# Patient Record
Sex: Male | Born: 1965
Health system: Southern US, Community
[De-identification: ages and names within clinical notes are randomized; demographics above are authoritative.]

## PROBLEM LIST (undated history)

## (undated) DIAGNOSIS — K219 Gastro-esophageal reflux disease without esophagitis: Secondary | ICD-10-CM

## (undated) DIAGNOSIS — I10 Essential (primary) hypertension: Secondary | ICD-10-CM

## (undated) DIAGNOSIS — E039 Hypothyroidism, unspecified: Secondary | ICD-10-CM

## (undated) DIAGNOSIS — Z593 Problems related to living in residential institution: Secondary | ICD-10-CM

## (undated) DIAGNOSIS — R569 Unspecified convulsions: Secondary | ICD-10-CM

## (undated) DIAGNOSIS — G40909 Epilepsy, unspecified, not intractable, without status epilepticus: Secondary | ICD-10-CM

## (undated) DIAGNOSIS — G4733 Obstructive sleep apnea (adult) (pediatric): Secondary | ICD-10-CM

## (undated) DIAGNOSIS — H5500 Unspecified nystagmus: Secondary | ICD-10-CM

## (undated) DIAGNOSIS — Q909 Down syndrome, unspecified: Secondary | ICD-10-CM

## (undated) DIAGNOSIS — Z789 Other specified health status: Secondary | ICD-10-CM

## (undated) HISTORY — PX: OTHER SURGICAL HISTORY: SHX169

## (undated) HISTORY — DX: Epilepsy, unspecified, not intractable, without status epilepticus: G40.909

## (undated) HISTORY — DX: Down syndrome, unspecified: Q90.9

## (undated) HISTORY — DX: Essential (primary) hypertension: I10

## (undated) HISTORY — DX: Obstructive sleep apnea (adult) (pediatric): G47.33

## (undated) HISTORY — DX: Hypothyroidism, unspecified: E03.9

---

## 1997-10-25 ENCOUNTER — Encounter (HOSPITAL_COMMUNITY): Admission: RE | Admit: 1997-10-25 | Discharge: 1998-01-23 | Payer: Self-pay | Admitting: Family Medicine

## 2001-04-18 ENCOUNTER — Ambulatory Visit: Admission: RE | Admit: 2001-04-18 | Discharge: 2001-04-18 | Payer: Self-pay | Admitting: Family Medicine

## 2004-05-14 ENCOUNTER — Ambulatory Visit: Payer: Self-pay | Admitting: Internal Medicine

## 2004-05-27 ENCOUNTER — Ambulatory Visit: Payer: Self-pay | Admitting: Internal Medicine

## 2005-10-01 ENCOUNTER — Inpatient Hospital Stay (HOSPITAL_COMMUNITY): Admission: EM | Admit: 2005-10-01 | Discharge: 2005-10-06 | Payer: Self-pay | Admitting: *Deleted

## 2005-10-15 ENCOUNTER — Inpatient Hospital Stay (HOSPITAL_COMMUNITY): Admission: EM | Admit: 2005-10-15 | Discharge: 2005-11-02 | Payer: Self-pay | Admitting: Specialist

## 2005-10-26 ENCOUNTER — Ambulatory Visit: Payer: Self-pay | Admitting: Infectious Diseases

## 2005-12-13 ENCOUNTER — Emergency Department (HOSPITAL_COMMUNITY): Admission: EM | Admit: 2005-12-13 | Discharge: 2005-12-13 | Payer: Self-pay | Admitting: Family Medicine

## 2006-06-24 ENCOUNTER — Emergency Department (HOSPITAL_COMMUNITY): Admission: EM | Admit: 2006-06-24 | Discharge: 2006-06-25 | Payer: Self-pay | Admitting: Emergency Medicine

## 2006-07-01 ENCOUNTER — Ambulatory Visit: Payer: Self-pay | Admitting: Vascular Surgery

## 2006-07-01 ENCOUNTER — Inpatient Hospital Stay (HOSPITAL_COMMUNITY): Admission: EM | Admit: 2006-07-01 | Discharge: 2006-07-13 | Payer: Self-pay | Admitting: Emergency Medicine

## 2006-07-15 ENCOUNTER — Emergency Department (HOSPITAL_COMMUNITY): Admission: EM | Admit: 2006-07-15 | Discharge: 2006-07-15 | Payer: Self-pay | Admitting: Emergency Medicine

## 2006-07-21 ENCOUNTER — Ambulatory Visit (HOSPITAL_BASED_OUTPATIENT_CLINIC_OR_DEPARTMENT_OTHER): Admission: RE | Admit: 2006-07-21 | Discharge: 2006-07-21 | Payer: Self-pay

## 2006-07-22 ENCOUNTER — Emergency Department (HOSPITAL_COMMUNITY): Admission: EM | Admit: 2006-07-22 | Discharge: 2006-07-22 | Payer: Self-pay | Admitting: Emergency Medicine

## 2006-07-23 ENCOUNTER — Ambulatory Visit (HOSPITAL_COMMUNITY): Admission: RE | Admit: 2006-07-23 | Discharge: 2006-07-23 | Payer: Self-pay | Admitting: Emergency Medicine

## 2006-07-24 ENCOUNTER — Ambulatory Visit: Payer: Self-pay | Admitting: Internal Medicine

## 2006-09-02 ENCOUNTER — Ambulatory Visit: Payer: Self-pay | Admitting: Internal Medicine

## 2006-11-10 ENCOUNTER — Ambulatory Visit: Payer: Self-pay | Admitting: Internal Medicine

## 2007-06-23 ENCOUNTER — Encounter: Admission: RE | Admit: 2007-06-23 | Discharge: 2007-06-23 | Payer: Self-pay | Admitting: Family Medicine

## 2007-10-09 ENCOUNTER — Encounter: Payer: Self-pay | Admitting: Internal Medicine

## 2007-10-15 ENCOUNTER — Encounter: Payer: Self-pay | Admitting: Internal Medicine

## 2007-12-28 ENCOUNTER — Encounter: Admission: RE | Admit: 2007-12-28 | Discharge: 2007-12-28 | Payer: Self-pay | Admitting: Family Medicine

## 2008-07-05 DIAGNOSIS — G4733 Obstructive sleep apnea (adult) (pediatric): Secondary | ICD-10-CM

## 2008-07-05 DIAGNOSIS — Q909 Down syndrome, unspecified: Secondary | ICD-10-CM | POA: Insufficient documentation

## 2008-07-08 ENCOUNTER — Ambulatory Visit: Payer: Self-pay | Admitting: Internal Medicine

## 2008-09-04 ENCOUNTER — Encounter: Admission: RE | Admit: 2008-09-04 | Discharge: 2008-09-04 | Payer: Self-pay | Admitting: Family Medicine

## 2008-11-04 ENCOUNTER — Encounter: Payer: Self-pay | Admitting: Internal Medicine

## 2009-07-07 ENCOUNTER — Ambulatory Visit: Payer: Self-pay | Admitting: Internal Medicine

## 2009-07-29 ENCOUNTER — Emergency Department (HOSPITAL_COMMUNITY): Admission: EM | Admit: 2009-07-29 | Discharge: 2009-07-29 | Payer: Self-pay | Admitting: Emergency Medicine

## 2009-08-13 ENCOUNTER — Encounter: Admission: RE | Admit: 2009-08-13 | Discharge: 2009-08-13 | Payer: Self-pay | Admitting: Family Medicine

## 2010-06-21 ENCOUNTER — Encounter: Payer: Self-pay | Admitting: Specialist

## 2010-06-30 NOTE — Assessment & Plan Note (Signed)
Summary: 1 year f/u/mbw   Primary Provider/Referring Provider:  Hildred Laser, MD  CC:  Yearly Follow up visit.  History of Present Illness: 07/08/08- OSA, Down's Hx is from his transporter. Actual family is not here.  Thayer Ohm is now using CPAP without oxygen. Uses cpap every night and she says he has adjusted and uses it without problemt. He is back in group home, using cpap everynight and staff passes on that he sleeps well with it. No concerns or questions reported.  July 07, 2009- OSA, Down's...................................transporter/ aide is here One year f/u up for this 45 yo w/ Down's and OSA. He continues using CPAP all night every night 10 cwp at his group home. He gets sleepy at lunch time while he gets up earlier during renovations at his residence building, but not because of CPAP. He indicates "yeah" ok with CPAP to sleep. Not reported to have other breathing problems to need his nebulizer.     Current Medications (verified): 1)  Levothyroxine Sodium 75 Mcg Tabs (Levothyroxine Sodium) .... Take 1 Tablet By Mouth Once A Day 2)  Bayer Low Strength 81 Mg Tbec (Aspirin) .... Take 1 Tablet By Mouth Once A Day 3)  Depakote 500 Mg Tbec (Divalproex Sodium) .... Take 1 Every Morning and 2 By Mouth Every Night 4)  Pepcid 20 Mg Tabs (Famotidine) .... Take 1 Tablet By Mouth Two Times A Day 5)  Qc Senna-S 8.6-50 Mg Tabs (Sennosides-Docusate Sodium) .... Take 1 Tab By Mouth At Bedtime 6)  Mucinex 600 Mg Xr12h-Tab (Guaifenesin) .... Take 1 By Mouth Two Times A Day 7)  Tylenol 325 Mg Tabs (Acetaminophen) .... Take 2 By Mouth Evey 4 Hours As Needed 8)  Albuterol Sulfate (2.5 Mg/33ml) 0.083% Nebu (Albuterol Sulfate) .Marland Kitchen.. 1 Vial Via Nebulizer Evey 6 Hours As Needed 9)  Cpap Advanced .Marland Kitchen.. 10cm  Allergies (verified): No Known Drug Allergies  Past History:  Past Medical History: Last updated: 07/08/2008  DOWNS SYNDROME (ICD-758.0) OBSTRUCTIVE SLEEP APNEA  (ICD-327.23) Hypothyroid  Family History: Last updated: 07/07/2009 Unknown- not available  Social History: Last updated: 07/08/2008 Group home Patient never smoked.   Risk Factors: Smoking Status: never (07/08/2008)  Past Surgical History: None known to staff  Family History: Unknown- not available  Review of Systems      See HPI  The patient denies anorexia, fever, weight loss, vision loss, hoarseness, chest pain, syncope, peripheral edema, prolonged cough, headaches, hemoptysis, abdominal pain, melena, and severe indigestion/heartburn.    Vital Signs:  Patient profile:   45 year old male Weight:      186 pounds O2 Sat:      94 % on Room air Pulse rate:   89 / minute BP sitting:   122 / 80  (left arm) Cuff size:   regular  Vitals Entered By: Reynaldo Minium CMA (July 07, 2009 10:27 AM)  O2 Flow:  Room air  Physical Exam  Additional Exam:  General: A/Ox3; pleasant and cooperative, NAD, Down's Syndrome- limited/ nonverbal, calm  SKIN: no rash, lesions NODES: no lymphadenopathy HEENT: Cobre/AT, EOM- WNL, Conjuctivae- clear, PERRLA, TM-WNL, Nose- clear, Throat- clear  Melampatti IV NECK: Supple w/ fair ROM, JVD- none, normal carotid impulses w/o bruits Thyroid-  CHEST: Clear to P&A HEART: RRR, no m/g/r heard ABDOMEN: Soft and nl;  TDV:VOHY, nl pulses, no edema  NEURO: Grossly intact to observation      Impression & Recommendations:  Problem # 1:  OBSTRUCTIVE SLEEP APNEA (ICD-327.23)  He appears to be  stable and doing well with CPAP. History is limited- he contributes none and aide with him is unaware of any concerns or needed changes.  Other Orders: Est. Patient Level II (09811)  Patient Instructions: 1)  Schedule return in one year, earlier if needed 2)  Continue CPAP at 10 cwp. Call as needed   Immunization History:  Influenza Immunization History:    Influenza:  historical (03/24/2009)

## 2010-07-06 ENCOUNTER — Ambulatory Visit (INDEPENDENT_AMBULATORY_CARE_PROVIDER_SITE_OTHER): Payer: PRIVATE HEALTH INSURANCE | Admitting: Internal Medicine

## 2010-07-06 ENCOUNTER — Encounter: Payer: Self-pay | Admitting: Internal Medicine

## 2010-07-06 DIAGNOSIS — G4733 Obstructive sleep apnea (adult) (pediatric): Secondary | ICD-10-CM

## 2010-07-06 DIAGNOSIS — Q909 Down syndrome, unspecified: Secondary | ICD-10-CM

## 2010-07-16 NOTE — Assessment & Plan Note (Signed)
Summary: ROV 1 YEAR   Primary Provider/Referring Provider:  Hildred Laser, MD  CC:  Yearly follow up visit-OSA; wearing CPAP each night but takes mask off at times..  History of Present Illness: 07/08/08- OSA, Down's Hx is from his transporter. Actual family is not here.  [name] is now using CPAP without oxygen. Uses cpap every night and she says he has adjusted and uses it without problemt. He is back in group home, using cpap everynight and staff passes on that he sleeps well with it. No concerns or questions reported.  July 07, 2009- OSA, Down's...................................transporter/ aide is here One year f/u up for this 45 yo w/ Down's and OSA. He continues using CPAP all night every night 10 cwp at his group home. He gets sleepy at lunch time while he gets up earlier during renovations at his residence building, but not because of CPAP. He indicates "yeah" ok with CPAP to sleep. Not reported to have other breathing problems to need his nebulizer.  July 06, 2010- OSA, Down's...................................transporter/ aide is here Nurse-CC: Yearly follow up visit-OSA; wearing CPAP each night but takes mask off at times. OSA- He wears mask every night with no concerns or special problems. They feel it helps. [name] is not able to speak for himself. He does take the mask off sometimes at night. Some nights he turns TV on to watch during night and will seem tireder next day.      Preventive Screening-Counseling & Management  Alcohol-Tobacco     Smoking Status: never  Current Medications (verified): 1)  Levothyroxine Sodium 75 Mcg Tabs (Levothyroxine Sodium) .... Take 1 Tablet By Mouth Once A Day 2)  Bayer Low Strength 81 Mg Tbec (Aspirin) .... Take 1 Tablet By Mouth Once A Day 3)  Depakote 500 Mg Tbec (Divalproex Sodium) .... Take 1 Every Morning and 2 By Mouth Every Night 4)  Pepcid 20 Mg Tabs (Famotidine) .... Take 1 Tablet By Mouth Two Times A Day 5)  Qc Senna-S  8.6-50 Mg Tabs (Sennosides-Docusate Sodium) .... Take 1 Tab By Mouth At Bedtime 6)  Mucinex 600 Mg Xr12h-Tab (Guaifenesin) .... Take 1 By Mouth Two Times A Day 7)  Tylenol 325 Mg Tabs (Acetaminophen) .... Take 2 By Mouth Evey 4 Hours As Needed 8)  Albuterol Sulfate (2.5 Mg/69ml) 0.083% Nebu (Albuterol Sulfate) .Marland Kitchen.. 1 Vial Via Nebulizer Evey 6 Hours As Needed 9)  Cpap Advanced .Marland Kitchen.. 10cm  Allergies (verified): No Known Drug Allergies  Past History:  Past Medical History: Last updated: 07/08/2008  DOWNS SYNDROME (ICD-758.0) OBSTRUCTIVE SLEEP APNEA (ICD-327.23) Hypothyroid  Family History: Last updated: 07/07/2009 Unknown- not available  Social History: Last updated: 07/08/2008 Group home Patient never smoked.   Risk Factors: Smoking Status: never (07/06/2010)  Past Surgical History: Right elbow surgery  Review of Systems      See HPI  The patient denies anorexia, fever, weight loss, weight gain, vision loss, hoarseness, chest pain, syncope, peripheral edema, prolonged cough, headaches, hemoptysis, abdominal pain, severe indigestion/heartburn, abnormal bleeding, enlarged lymph nodes, and angioedema.    Vital Signs:  Patient profile:   45 year old male Weight:      200.38 pounds O2 Sat:      95 % on Room air Pulse rate:   88 / minute BP sitting:   120 / 78  (left arm) Cuff size:   large  Vitals Entered By: Reynaldo Minium CMA (July 06, 2010 10:37 AM)  O2 Flow:  Room air CC: Yearly follow  up visit-OSA; wearing CPAP each night but takes mask off at times.   Physical Exam  Additional Exam:  General: A/Ox3; pleasant and cooperative, NAD, Down's Syndrome- limited/ nonverbal, calm, overweight SKIN: no rash, lesions NODES: no lymphadenopathy HEENT: /AT, EOM- WNL, Conjuctivae- clear, PERRLA, TM-WNL, Nose- clear, Throat- clear  Mallampati IV NECK: Supple w/ fair ROM, JVD- none, normal carotid impulses w/o bruits Thyroid-  CHEST: Clear to P&A HEART: RRR, no m/g/r  heard ABDOMEN: Soft and nl;  ZOX:WRUE, nl pulses, no edema  NEURO: Responds to yes and no questions. Horizontal nystagmus      Impression & Recommendations:  Problem # 1:  OBSTRUCTIVE SLEEP APNEA (ICD-327.23)  Good control and compliance with CPAP and staff feels it continues to help him, as observe in his sheltered living facility. .   Problem # 2:  DOWNS SYNDROME (ICD-758.0)  Getting old for Downs Syndrome.   Other Orders: Est. Patient Level III (45409)  Patient Instructions: 1)  Please schedule a follow-up appointment in 1 year. 2)  Continue CPAP at 10. Please let [facility] know if there are concerns with the mask or machine.

## 2010-10-13 NOTE — Assessment & Plan Note (Signed)
[facility]                             PULMONARY OFFICE NOTE   NAME:[name], [name]                 MRN:          [id number]  DATE:11/10/2006                            DOB:          Mar 09, 1966    PROBLEM:  1. Obstructive sleep apnea.  2. Down's syndrome.   HISTORY:  Overnight oximetry on room air done on April 15th showed that  he desaturated below 88% almost half the night.  I discussed this with  his mother and the caregiver from the home where he stays and we  compared it to his sleep study of February 15, which demonstrated  moderately severe obstructive apnea with an index of 55.7.  He did not  desaturate much at all on that study.  Mother says he is doing very much  better and is back to his usual personality, seeming happy and  comfortable since he returned to his familiar environment in his group  home.  That environment does not permit use of oxygen or CPAP.  He very  much disliked staying at the center where he could have had that kind  of support.  He would not stay on oxygen when it was tried in the center  and was intolerant of CPAP.  Mother clearly feels that his quality of  life overall is better if we drop the issues of his respiratory status  so that he can stay in his more familiar environment.  I went over this  several times with her and the assistant to be sure they understood.   MEDICATION:  1. Levothyroxine 75 mcg.  2. Aspirin 81 mg.  3. Depakote 500 mg x2 b.i.d.  4. Pepcid 20 mg b.i.d.  5. Albuterol by nebulizer p.r.n.   No medication allergy.   OBJECTIVE:  Weight 176 pounds, BP 112/58, pulse 76, room air saturation  97%.  He is alert and pleasant.  There is a little bit of duskiness at  his knees but nail beds are pink.  He is clearly not able to comprehend  medical issues being discussed.  Breathing seems unlabored.  HEART SOUNDS:  Regular.   IMPRESSION:  1. Moderately severe obstructive sleep apnea.  2.  Nocturnal hypoxemia demonstrated on overnight oximetry but less      apparent on the night of his sleep study.   PLAN:  Mother and caregiver express understanding and willingness to  accept the medical consequences of staying off oxygen and CPAP, which  they do not believe he would leave on  anyway.  It is much important, they feel, to his quality of life that he  be allowed to stay in the group home.  I have offered to see him again  p.r.n.     Manuel D. Maple Hudson, MD, [name], FACP  Electronically Signed    CDY/MedQ  DD: 11/12/2006  DT: 11/13/2006  Job #: [id number]   cc:   Dione Housekeeper, Dr.

## 2010-10-16 NOTE — Discharge Summary (Signed)
[name], [name]          ACCOUNT NO.:  [id number]   MEDICAL RECORD NO.:  [id number]          PATIENT TYPE:  INP   LOCATION:  5038                         FACILITY:  [facility]   PHYSICIAN:  Hillery Aldo, M.D.   DATE OF BIRTH:  18-Apr-1966   DATE OF ADMISSION:  07/01/2006  DATE OF DISCHARGE:  07/11/2006                         DISCHARGE SUMMARY - REFERRING   ADDENDUM:   PRIMARY CARE PHYSICIAN:  Teena Irani. Arlyce Dice, M.D.   For a complete list of the discharge diagnoses, discharge medications,  procedures and diagnostic studies, consultations, brief HPI and hospital  course through July 07, 2006, please see the previously dictated  discharge summary done by Dr. Mikeal Hawthorne.   ADDITIONAL/DISCHARGE DIAGNOSIS:  Recurrent hypoxia thought to be  secondary to a combination of aspiration events as well as obesity  hypoventilation syndrome/obstructive sleep apnea.   No changes to the patient's medications.   REMAINDER OF HOSPITAL COURSE:  [name] planned discharge on  July 07, 2006, was held secondary to hypoxic events.  Patient's O2  saturations dropped down into the 70s while he was eating.  He was put  on supplemental O2 and was able to maintain his oxygen saturation on low  flow oxygen.  Speech pathology saw him on July 08, 2006, and they  felt that his chronic dysphagia put him at risk for ongoing aspiration  events.  They recommended full supervision with meal intake and a follow-  up modified barium swallow.  Because of concerns for obstructive sleep  apnea and obesity hypoventilation syndrome, respiratory therapy was  asked to try the patient on a trial of nasal CPAP.  Unfortunately, the  patient had difficulty tolerating the CPAP, complaining of headaches and  pulling the CPAP off.  He did have an overnight pulse oximeter study  which showed 26 desaturations overnight for a total time of 11 minutes  of 30 seconds with pulse oximetry readings below 89%.  The patient  will  need a formal sleep study as an outpatient to fully assess his sleep  apnea.  Nevertheless, he may be more tolerant of nasal cannula oxygen at  bedtime to help keep his pulse oximetry readings up.  At this point, the  modified barium swallow is scheduled for later this afternoon and once  this is completed, the patient will be stable for discharge back to his  group home.  His primary care physician can follow-up with results of  this modified barium swallow.   DISPOSITION:  Patient will be discharged back to his skilled nursing  facility.  No changes to the medications as outlined on the previously  dictated discharge summary.   Note, the patient has completed his full course of treatment with Avelox  400 mg while in the hospital.      Hillery Aldo, M.D.  Electronically Signed     CR/MEDQ  D:  07/11/2006  T:  07/11/2006  Job:  [id number]   cc:   Teena Irani. Arlyce Dice, M.D.

## 2010-10-16 NOTE — H&P (Signed)
[name], [name]          ACCOUNT NO.:  [id number]   MEDICAL RECORD NO.:  [id number]          PATIENT TYPE:  INP   LOCATION:  2022                         FACILITY:  [facility]   PHYSICIAN:  Thomasenia Bottoms, MDDATE OF BIRTH:  03-09-1966   DATE OF ADMISSION:  07/01/2006  DATE OF DISCHARGE:                              HISTORY & PHYSICAL   HISTORY OF PRESENTING ILLNESS:  [name] is a 45 year old with Down  syndrome who was sent in from his group home today because of lethargy.  The patient has also had congestion for over a week now.  They checked  his sats and they were 88% on room air.  So, this combined with the  lethargy prompted his visit to the emergency department.  The patient  has been on antibiotics for his congestion but also the patient fell and  fractured his right foot about a week ago and his mother noticed that  the patient had significant swelling of his right thigh above the area  of his cast.  So, because of this, orthopedics has taken the cast off  and we are going to admit the patient and evaluate him for pulmonary  embolus.  [name] mother is also quite concerned because this is  exactly how he presented when he had a lung abscess several years ago.  The patient is unable to provide any history.   PAST MEDICAL HISTORY:  Significant for:  1. Down syndrome.  2. Seizure disorder.  3. Fracture of his right elbow which was complicated by methicillin-      resistant Staphylococcus aureus.  4. He has a history of a lung abscess.  5. Osteopenia.  6. He has had several surgeries which resulted from his right arm      fracture.   MEDICATIONS ON ARRIVAL:  Include:  1. Depakote 500 mg two tablets b.i.d.  2. Amoxicillin 250 mg p.o. t.i.d. for ten days.  3. Ibuprofen 800 mg q. 8 h. scheduled for pain.  4. Levothyroxine 75 mg p.o. daily.  5. Aspirin daily which started today because of the fracture.  6. He was on some Maxitrol eye drops but the reason  for those is not      clear at this time.   REVIEW OF SYSTEMS:  The patient is not able to provide family history.  Both of his parents are here and they appear healthy.   PHYSICAL EXAMINATION:  GENERAL:  He is a pleasant gentleman with Down  syndrome who is lethargic.  HEENT:  Cranium is normocephalic and atraumatic.  His sclerae are  nonicteric.  Oral mucosa moist.  NECK:  Supple.  No lymphadenopathy.  No thyromegaly.  No jugular venous  distention.  CARDIAC:  Regular rate and rhythm.  LUNGS:  Reveal moderate wheezing and rhonchi bilaterally.  ABDOMEN:  Soft, nontender, nondistended.  Normoactive bowel sounds.  No  masses are appreciated.  EXTREMITIES:  Reveal mild edema which is nonpitting.  It is symmetric  bilaterally.  He has a significant bruise around the base of his right  first toe.  NEUROLOGICALLY:  The patient is lethargic but he will  wake up.  He does  follow simple commands.  He is not really saying much at this time.  The  family says normally he is quite communicative.  He has classic Down  syndrome features.  No facial asymmetry.  No asymmetric weakness noted.   DATA:  All of his laboratory data is pending at this time.   ASSESSMENT AND PLAN:  1. Hypoxia with mild respiratory failure.  O2 sats of 88% prior to      arrival.  We will work this up by getting a CT scan of his chest to      rule out pulmonary embolus and lung abscess, but I suspect this may      be a routine bronchitis.  We will continue his amoxicillin, add      some Zithromax, and start him on nebulizers for the wheezing, that      certainly could be the reason for his hypoxia.  We are going to      withhold steroids right now, but if he continues and does not      improve with the wheezing, that would be the next step.  2. Right foot fracture.  Treatment per orthopedics.  His cast has been      removed because of the swelling.  3. Swelling of the right lower extremity.  Doppler has been done.   The      preliminary results reveal no deep venous thrombosis.  We will      await the formal results.  4. Seizure disorder.  This has been stable.  He has not seizures in      years.  We will continue his outpatient medication.   I did discuss the plan with the patient's family.  We will be admitting  him to a telemetry bed.  He does appear respiratorily stable, though we  will put him on telemetry in case he has PE and that can be discontinued  swiftly.      Thomasenia Bottoms, MD  Electronically Signed     CVC/MEDQ  D:  07/01/2006  T:  07/02/2006  Job:  [id number]   cc:   Teena Irani. Arlyce Dice, M.D.

## 2010-10-16 NOTE — Discharge Summary (Signed)
[name], [name]          ACCOUNT NO.:  [id number]   MEDICAL RECORD NO.:  [id number]          PATIENT TYPE:  INP   LOCATION:  5038                         FACILITY:  [facility]   PHYSICIAN:  Hillery Aldo, M.D.   DATE OF BIRTH:  03-27-1966   DATE OF ADMISSION:  07/01/2006  DATE OF DISCHARGE:  07/12/2006                               DISCHARGE SUMMARY   ADDENDUM TO DISCHARGE SUMMARY   PRIMARY CARE PHYSICIAN:  Teena Irani. Arlyce Dice, M.D.   ADDENDUM:  The patient has been stable for discharge, however discharge  was held pending group home acceptance of the patient.  Unfortunately,  the group home does not feel that they can accept him on supplemental  oxygen.  The patient does not have a formal diagnosis of obstructive  sleep apnea or obesity hypoventilation syndrome at this time.  He needs  an outpatient sleep study to confirm and verify that he has this.  Nevertheless, the patient is stable and is safe to discharge off  supplemental oxygen.  He is maintaining his oxygen saturations in the  low to mid 90's on room air.  Also, the patient did undergo a modified  barium swallowing study on July 11, 2006.  There was no evidence of  penetration or aspiration observed during this study.  There was only  intermittent mild swallowing delay.  Recommendations are to continue a  dysphagia III diet, with full supervision, and encourage small bites and  sips while eating or drinking.  No further speech therapy was  recommended.   DISPOSITION:  [name] for discharge back to his group home  if they will accept him.  Again, he does not need supplemental oxygen at  this time.  We do recommend, however, that he be set up for an  outpatient sleep study.      Hillery Aldo, M.D.  Electronically Signed     CR/MEDQ  D:  07/12/2006  T:  07/12/2006  Job:  [id number]   cc:   Teena Irani. Arlyce Dice, M.D.

## 2010-10-16 NOTE — H&P (Signed)
NAMEMARSH, [name]          ACCOUNT NO.:  [id number]   MEDICAL RECORD NO.:  [id number]           PATIENT TYPE:   LOCATION:                                 FACILITY:   PHYSICIAN:  Jene Every, M.D.         DATE OF BIRTH:   DATE OF ADMISSION:  DATE OF DISCHARGE:                                HISTORY & PHYSICAL   CHIEF COMPLAINT:  Right elbow pain and ulceration.   HISTORY:  Mr. [name] is a pleasant 45 year old gentleman who has a history  of Down's syndrome.  He was initially treated by [name] over at [facility] for an open comminuted right elbow fracture.  The patient was  discharged from [facility].  He was then sent to a group home.  Unfortunately,  there he had some breakdown of his wound, pressure ulcer caused by the  splint.  The patient was initially treated on an outpatient basis with  Keflex with close followup.  He was evaluated several days following initial  onset with worsening of his symptoms.  It was felt at this point he needed  to be admitted to the hospital for IV antibiotics and consideration of  irrigation, debridement, and revision of the elbow.  The risks and benefits  of this were discussed with the family and they did wish to proceed.   MEDICAL HISTORY:  Significant for Down's syndrome, hypothyroidism.   MEDICATIONS:  1.  Depakote 1000 mg one p.o. b.i.d.  2.  Synthroid 75 mcg one p.o. q.a.m.  3.  Keflex 500 mg one p.o. q.i.d.  4.  Vicodin p.r.n. pain.   ALLERGIES:  None.   SOCIAL HISTORY:  The patient does stay at a group home facility.  His father  is Dr. Eulah Pont; mother is his primary guardian.   FAMILY HISTORY:  Noncontributory.   SOCIAL HISTORY:  Not pertinent.   REVIEW OF SYSTEMS:  GENERAL:  The patient denies any fever, chills, night  sweats, or bleeding tendencies.  CNS:  No blurred or double vision, seizure,  headache, or paralysis.  RESPIRATORY:  No shortness of breath, productive  cough, or hemoptysis.  CARDIOVASCULAR:   No chest pain, angina, or orthopnea.  GU:  No dysuria, hematuria, or discharge.  GI:  No nausea, vomiting,  diarrhea, constipation, melena, or bloody stools.  MUSCULOSKELETAL:  Pertinent to HPI.   PHYSICAL EXAMINATION:  GENERAL:  This is a well-developed, well-nourished  gentleman seen upright in no acute distress.  The patient does not respond  verbally to most commands.  HEENT:  Atraumatic, normocephalic.  Pupils equal, round, react to light.  EOMs intact.  NECK:  Supple, no lymphadenopathy.  CHEST:  Clear to auscultation bilaterally.  No rhonchi, wheezes, or rales.  HEART:  Regular rate and rhythm without murmurs, gallops, or rubs.  ABDOMEN:  Soft, nontender, nondistended, bowel sounds x4.  EXTREMITIES:  In regard to the right elbow, the patient does have breakdown  of his incision over his olecranon process.  There is some pustulous  drainage.  There is visible hardware as well as erythema around the site.  Compartments are  soft, no tracking or cellulitis is noted extending beyond  the incision.  He does have some diffuse edema into the upper extremity with  decreased function along the radial nerve.  Capillary refill is brisk.   IMPRESSION:  Status post open reduction internal fixation of the right elbow  with development of wound breakdown and infection.   PLAN:  At this point the patient will be admitted to [facility]  for IV antibiotics, as well as consideration of irrigation, debridement, and  revision of the ORIF of the elbow.      Roma Schanz, P.A.      Jene Every, M.D.  Electronically Signed    CS/MEDQ  D:  12/08/2005  T:  12/08/2005  Job:  [id number]

## 2010-10-16 NOTE — Discharge Summary (Signed)
NAMEJADRIAN, [name]          ACCOUNT NO.:  [id number]   MEDICAL RECORD NO.:  [id number]          PATIENT TYPE:  INP   LOCATION:  1518                         FACILITY:  [facility]   PHYSICIAN:  [name], M.D.    DATE OF BIRTH:  05/12/1966   DATE OF ADMISSION:  10/15/2005  DATE OF DISCHARGE:                           DISCHARGE SUMMARY - REFERRING   ADMISSION DIAGNOSIS:  Down's syndrome, status post open reduction and  internal fixation of right elbow with breakdown of incision secondary to  pressure ulcer.  Status post revision with open reduction and internal  fixation with removal of plate, irrigation and debridement of wound.   CONSULTS:  PT and OT, wound care, infectious disease, case management.   HISTORY:  [name] is a pleasant 45 year old gentleman with a history of  [name]' syndrome.  He underwent an ORIF by Dr. Jene Cline and Dr. Mina Cline  several weeks ago.  At his first postop visit, the wound was examined and  the patient did have a pressure ulcer of the olecranon, status post to poor  wound care at the group home, as well as rubbing of his splint.  The patient  was packed in the office, placed on Keflex, reevaluated several days later  with worsening of his symptoms.  He was admitted to the hospital at that  point for IV antibiotics and consideration of revision.   PROCEDURE:  The patient was taken to the OR on May 26, and underwent removal  of plate with open irrigation and debridement and closure of the wound.  Surgeon:  Dr. Jene Cline.  Anesthesia:  General.  Complications:  None.   LABORATORY DATA:  Preadmission CBC showed a white cell count of 8.7,  hemoglobin 10.7, hematocrit 33.3.  These were followed throughout the  hospital course.  White cells remained normal.  Hemoglobin remained stable.  Routine chemistries done on admission showed sodium 141, potassium 5.2,  normal glucose of 74, normal renal function noted.  These were followed  through  the hospital course and remained within normal range.  Vancomycin  troughs were done daily and monitored by the pharmacy.  Multiple wound  cultures were obtained intraoperatively.  Smear showed moderate WBC present.  Wound culture did show rare Methicillin-resistant Staph aureus.  No other  organisms were noted.  C. difficile toxin was negative.  There is no chest x-  ray and no EKG on the chart.   HOSPITAL COURSE:  The patient was admitted.  IV antibiotics were done, as  well as strict elevation to the upper extremity.  Pulsatile lavage was  initiated.  Wound care was started.  Dr. Chilton Cline was consulted to help in the  patient's care.  The patient did fairly well.  At the time of admission,  noted increased edema and minimal pain.  There was decrease in erythema.  Dr. Chilton Cline did feel the patient would need revision of the elbow hardware.  The patient was set up for surgery.  After several days of IV antibiotics,  he was taken to the OR and underwent open removal of the plate, as well as  irrigation of the wound  which showed some purulent drainage.  At that point,  Dr. Shelle Cline decided not to place any hardware.  He just cleaned the wound well  with pulsatile lavage.  He did closure and resumed wound care.  Vancomycin  was started.  Infectious disease was consulted to assist in the patient's  care.  Throughout the hospital course, the patient remained afebrile.  He  continued to have radial nerve deficit as preoperatively.  Decreasing edema  was noted.  Daily dressing changes were continued.  Per infectious disease,  it was felt the patient would need a total of four weeks of Vancomycin  antibiotic therapy, followed by oral therapy with doxycycline.  Discharge  planning was initiated for skilled nursing facility as this facility can  handle specific wound care needs of the patient.  Psych was consulted for  placement of the patient into a nursing facility.  A PICC line was placed  for long-term  antibiotic therapy.  On May 31, it is felt at this point that  the patient is stable to be discharged home once the nursing facility is  decided.  Discharged to nursing home facility of choice.  Also, the patient  has followup with Dr. Shelle Cline in approximately one week for wound evaluation.  He will need aggressive wound therapy.  Instructions will be normal saline  irrigation daily, normal saline wet to moist changes for right granulation  tissue with Nugauze packing.  He is in an anterior splint which should be  padded.  Dressing should be at least down to the wrist to control edema.  He  should continue to use his admission sling for edema control.  The patient  does have a radial nerve palsy.  He may require splinting with Futuro, as  well.  He should be nonweightbearing to the right upper extremity.  The  patient is to have contact for call secondary to MRSA infection into the  right wound.  Dr. Shelle Cline would like to speak to whoever is instructed to take  care of his wound.  Paging number is [phone], office number [phone],  extension [phone].   DISCHARGE MEDICATIONS:  1.  Depakote 1000 mg one p.o. b.i.d.  2.  Synthroid 75 mcg one p.o. q.a.m.  3.  Vicodin 1 to 2 p.o. q.4-6h. p.r.n. pain.  4.  Vancomycin as dosed per the pharmacy.  This will need to be completed      for a total of four weeks and then progress to p.o. doxycycline 100 mg      b.i.d.   PICC line care will need to be initiated.   ACTIVITY:  The patient can ambulate in the hallways as tolerated.  Again, he  will need strict elevation of the extremity for edema control.  If this does  get out of control, it is best resolved with elevation.  You need to call  Dr. Ermelinda Cline office immediately.  Neurovascular checks to the upper  extremities.  Call if any changes in his wound are noted.  Currently, at  this time, the patient's wound is healing with secondary intention.   DIET:  As tolerated.  CONDITION ON DISCHARGE:  Stable  and improved.   FINAL DIAGNOSIS:  Doing well status post removal of hardware, irrigation and  debridement of right elbow incision.      Roma Schanz, P.A.      [name], M.D.  Electronically Signed    CS/MEDQ  D:  10/28/2005  T:  10/28/2005  Job:  [id number]

## 2010-10-16 NOTE — Discharge Summary (Signed)
[name], [name]          ACCOUNT NO.:  [id number]   MEDICAL RECORD NO.:  [id number]          PATIENT TYPE:  INP   LOCATION:  5504                         FACILITY:  [facility]   PHYSICIAN:  Jene Every, M.D.    DATE OF BIRTH:  22-Apr-1966   DATE OF ADMISSION:  10/01/2005  DATE OF DISCHARGE:  10/06/2005                                 DISCHARGE SUMMARY   ADDENDUM:   DATED:  Oct 01, 2005   I was co-surgeon with Dr. Mina Marble.   I was asked to dictated an operative report given that we were co-surgeons.  However, it was found out later that his operative report would suffice.  In  reviewing the operative report from that request, the only addition that I  would make to that would be that initially after the patient's limb was  exsanguinated, we copiously irrigated the wound and the open grade 1  laceration with saline solution and evacuated hematoma.  There was no viable  tissue there.      Jene Every, M.D.  Electronically Signed     JB/MEDQ  D:  02/14/2006  T:  02/14/2006  Job:  [id number]

## 2010-10-16 NOTE — Procedures (Signed)
NAME:  [name], [name]          ACCOUNT NO.:  [id number]   MEDICAL RECORD NO.:  [id number]          PATIENT TYPE:  OUT   LOCATION:  SLEEP CENTER                 FACILITY:  [facility]   PHYSICIAN:  Clinton D. Maple Hudson, MD, FCCP, FACPDATE OF BIRTH:  Sep 25, 1967   DATE OF STUDY:  07/15/2006                            NOCTURNAL POLYSOMNOGRAM   REFERRING PHYSICIAN:  Dione Housekeeper, M.D.   INDICATION FOR STUDY:  Hypersomnia with sleep apnea.   EPWORTH SLEEPINESS SCORE:  15/24, BMI 33.9, weight 187 pounds.   HOME MEDICATIONS:  Aspirin, Pepcid, Depakote, Synthroid, guaifenesin,  Senokot.  Down's syndrome.   SLEEP ARCHITECTURE:  Short total sleep time 146 minutes, sleep  efficiency 35%.  Stage I was 7%, stage II 93%, stages III, IV, and REM  absent.  Sleep latency 28 minutes. Awake after sleep onset 207 minutes.  Arousal index 5.7.  No bedtime medication was taken.   RESPIRATORY DATA:  Apnea/hypopnea index (AHI, RDI) 55.7 obstructive  events per hour indicating severe obstructive sleep apnea/hypopnea  syndrome.  There were 3 central apnea, 131 obstructive apneas, 2 mixed  apneas.  All sleep and, therefore, all events were recorded while  supine.  There was insufficient sleep in the first hours of the study to  permit CPAP titration by split protocol on this study night.   OXYGEN DATA:  Moderate snoring with oxygen desaturation to a nadir of  94%.  Mean oxygen saturation was 98% on room air.   CARDIAC DATA:  Normal sinus rhythm.   MOVEMENT-PARASOMNIA:  Rare limb jerk.  No movement or behavioral  disturbance. Bruxism.   IMPRESSIONS-RECOMMENDATIONS:  1. Short total sleep time, absent deeper sleep and REM.  2. Moderately severe obstructive sleep apnea/hypopnea syndrome, AHI      55.7 per hour with all sleep and, therefore, all events while      supine.  Moderate snoring with oxygen desaturation to a nadir of      94%.  3. There were insufficient early events and sleep time to permit CPAP    titration by split protocol on this study      night.  Consider return for CPAP titration or evaluate for      alternative therapy as appropriate.  4. Bruxism.      Clinton D. Maple Hudson, MD, [facility], FACP  Diplomate, Biomedical engineer of Sleep Medicine  Electronically Signed     CDY/MEDQ  D:  07/24/2006 11:52:22  T:  07/24/2006 19:08:36  Job:  [id number]

## 2010-10-16 NOTE — Discharge Summary (Signed)
Manuel, Cline          ACCOUNT NO.:  [id number]   MEDICAL RECORD NO.:  [id number]          PATIENT TYPE:  INP   LOCATION:  5038                         FACILITY:  [facility]   PHYSICIAN:  Lonia Blood, M.D.      DATE OF BIRTH:  08/11/1965   DATE OF ADMISSION:  07/01/2006  DATE OF DISCHARGE:  07/07/2006                               DISCHARGE SUMMARY   PRIMARY CARE PHYSICIAN:  The patient is unassigned to [place].   He lives in a group home and has been seen repeatedly by orthopedics.   DISCHARGE DIAGNOSES:  1. Possible aspiration pneumonia.  2. Dysphagia, currently on dysphagia 3 diet, status post modified      barium swallow.  3. Multiple fractures recently with his casts now removed.  4. Being followed by Dr. Jene Every for Down syndrome.  5. History of methicillin-resistant Staphylococcus aureus infection.   DISCHARGE MEDICATIONS:  1. Avelox 400 mg p.o. daily for 5 days.  2. Aspirin 81 mg daily.  3. Pepcid 20 mg b.i.d.  4. Depakote 1,000 mg p.o. b.i.d.  5. Synthroid 75 mcg daily.  6. Guaifenesin 600 mg p.o. b.i.d.  7. Senokot S one tablet q.h.s.   DISPOSITION:  The patient will be discharged back to his group home.  He  has improved tremendously.  He is back to his baseline.  He still has  right lower extremity swelling.  His cast has been taken off.  He should  follow up with Dr. Jene Every, his orthopedic surgeon, as an  outpatient for further evaluation.  Evaluation so far showed no deep  vein thrombosis.   PROCEDURES PERFORMED:  1. CT angiogram of the chest, on July 01, 2006, showed no CT      evidence of acute pulmonary embolus.  He had diffuse tree-in-bud      opacity involving both lower lobes which were thought to be      suggestive of diffuse bronchial pneumonia or atypical infection,      probably atelectasis or scar.  He has borderline mediastinal      lymphadenopathy.  Recommendation is a followup CT scan probably in      6 weeks to 3 months.  2. Modified barium swallow on July 04, 2006, showed some elements      of dysphagia characterized as mild.  The patient has been placed on      a D-3 diet with thin liquids, also assist with meals.  3. Right lower extremity venous Doppler on July 01, 2006, that      showed no evidence of DVT.   CONSULTATION:  Orthopedic surgery, Jene Every, M.D.   BRIEF HISTORY AND PHYSICAL:  Please refer to dictated history and  physical by Dr. Thomasenia Bottoms.  The patient was brought in from a  nursing home with shortness of breath.  He has had a history of Down  syndrome, seizure disorder, etcetera.  He has right lower extremity  swelling and pain where he had a fracture recently.  He was subsequently  admitted to rule out PE as well as DVT .   HOSPITAL COURSE:  1.  Shortness of breath.  This seems to be probably secondary to some      bronchial pneumonia or just bronchitis.  [name] lower      extremity Doppler showed no evidence of DVT.  His chest CT also      showed no evidence of PE.  With antibiotics, mainly vancomycin and      Avelox, the patient has responded very well.  He is currently back      to his baseline.  Prior to admission he was on amoxicillin      apparently which was stopped.  2. Dysphagia.  As part of his workup for pneumonia, the patient was      checked for dysphagia.  The patient had a modified barium swallow      currently showing he has mild dysphagia, so he will continue on a D-      3 diet with thin liquids.  3. Multiple fractures.  He has been followed by Dr. Jene Every.  He      will continue to follow with orthopedic surgery.  4. Down syndrome.  The patient is quite stable.  5. History of MRSA.  Due to the patient's history of MRSA infection,      he was placed in isolation.  He also had vancomycin IV throughout      the hospitalization.  At this point, however, he will only need to      continue his quinolones and complete his treatment.    DISCHARGE LABORATORY:  White count 3.9, hemoglobin 11.8, platelets 283.  Sodium 135, potassium 4.8, chloride 98, CO2 30, BUN 13, creatinine 0.9,  glucose 81, calcium 8.3.      Lonia Blood, M.D.  Electronically Signed     LG/MEDQ  D:  07/07/2006  T:  07/07/2006  Job:  [id number]

## 2010-10-16 NOTE — Op Note (Signed)
[name], [name]          ACCOUNT NO.:  [id number]   MEDICAL RECORD NO.:  [id number]          PATIENT TYPE:  INP   LOCATION:  1518                         FACILITY:  [facility]   PHYSICIAN:  Jene Every, M.D.    DATE OF BIRTH:  [birth year]   DATE OF PROCEDURE:  10/22/2005  DATE OF DISCHARGE:                                 OPERATIVE REPORT   PREOPERATIVE DIAGNOSIS:  Status post open reduction and internal fixation,  open elbow intercondylar fracture, with postoperative decubitus, with  prominent hardware.   POSTOPERATIVE DIAGNOSIS:  Status post open reduction and internal fixation,  open elbow intercondylar fracture, with postoperative decubitus, with  prominent hardware.   PROCEDURES PERFORMED:  1.  Removal of hardware of the olecranon, right elbow.  2.  Irrigation and debridement.  3.  Packing.   BRIEF HISTORY AND INDICATIONS:  This is a 45 year old who is status post a  complex fracture of the right elbow and olecranon.  He underwent an  extensive ORIF and debridement by Dr. Mina Marble and myself over approximately  two weeks ago.  Postoperatively the patient did well.  He was discharged to  home.  The patient had developed a decubitus over the olecranon at the  proximal end of the plate.  This is right over the area of the proximal  screw.  This was debrided in the office.  The patient had a cellulitic area  around it.  He was admitted for IV antibiotics for reducing the cellulitis  and went through daily packing and antibiotics.  He was indicated then for  revision of his construct to a lower-profile construct so that we can close  the wound over that.  We discussed the risks and benefits including  bleeding, infection, no change in symptoms or worsening symptoms, need for  repeat revision.   TECHNIQUE:  The patient was taken to the operating room and placed in the  supine position.  After the adequate induction of general anesthesia, he was  placed in the left lateral  decubitus position and all bony prominences well-  padded.  I prepped and draped the upper extremity.  I removed a packing in  there which looked at some drainage into slight purulence noted to it.  There was good granulation tissue, and there was exposed proximal end of the  olecranon plate.  Initial intention was to remove this, place a tension band  wire for a lower profile and primary closure.  I felt, though, that at this  point in time that replanting hardware would risk effectuation of an  infection and therefore decided to remove the hardware, irrigate and debride  it, pack it open and return at a later date for a staged procedure.  Due to  his previous radial nerve injury, we elected not to use the tourniquet at  this point in time.  We made an incision over the olecranon area and  debrided the skin edges.  Electrocautery was utilized to achieve strict  hemostasis.  I spread the previous incision site down to the bone of the  olecranon.  Removed the plate.  I obtained cultures  and a stat Gram stain as  well.  I irrigated the wound and it did not appear to have any myonecrosis  or any areas of abscess or pocket of pus.  This was irrigated and debrided  and pulsatile lavage was utilized as well.  Good granulation tissue was  noted, good bleeding tissue following this.  After curetting the bone as  well, I then closed this distally with 2-0 Vicryl simple sutures and then  staples distally.  In the area where there is some erythema, I closed it  with 4-0 nylon simple sutures, left approximately 1-1.5 sq. cm over the  olecranon open of granulation tissue and packed it with Nu-Gauze.  We placed  a bulky dressing with Adaptic 4 x 4's, ABD, and then replaced him back into  his splint and his wrist splint as well.   The patient tolerated the procedure well.  There were no complications.  Curetted the screw holes as well.  He was then awoken without difficulty and  transferred to the  recovery room in satisfactory condition.   The patient tolerated the procedure well with no complication.      Jene Every, M.D.  Electronically Signed     JB/MEDQ  D:  10/22/2005  T:  10/23/2005  Job:  [id number]

## 2010-10-16 NOTE — Assessment & Plan Note (Signed)
[facility]                             PULMONARY OFFICE NOTE   NAME:[name], [name]                 MRN:          [id number]  DATE:09/02/2006                            DOB:          1965/12/12    SLEEP MEDICINE CONSULTATION   PROBLEM:  A 45 year old young man with Down syndrome seen on kind  referral from Dr. Ronnie Derby in the company of his mother and his attendant  for evaluation of sleep disordered breathing and possible oxygen  desaturation.   HISTORY:  He had a broken foot and pneumonia putting him in the  hospital.  While there, he was wearing oxygen, and off of oxygen, he  would desaturate, so he was discharged on home oxygen.  Mother is  concerned if he really needs it.  There had also been question about his  breathing at night, leading to a nocturnal polysomnogram at the [facility] on July 15, 2006.  He slept only about 146 minutes on the  study, limiting evaluation.  During the time he was asleep, he  demonstrated moderately severe obstructive apnea with an index of 55.7  per hour, sleeping only on his back.  Oxygen desaturation nadir was 94%.  Bed time is between 8:30 and 10 p.m. getting up at 6:30 a.m.  His weight  has gone up about 20 pounds in the last 2 years.  Currently, he is  sleeping with oxygen at night at 2L.  Whether he needs oxygen will drive  whether he has to stay in a higher acuity of care area or can be moved  back to his previous living environment.  They bring a manually recorded  log of oxygen saturations taken every 30 minutes over several nights.  There were transient decreases once or twice into the 86-90% range, but  most values recorded 90-96% on room air.  We discussed the known  predisposition for Down syndrome to be accompanied by sleep apnea  because of facial anatomy.   MEDICATIONS:  1. Levothyroxine 75 mcg.  2. Aspirin 81 mg.  3. Mucinex 600 mg.  4. Depakote 500 mg x2.  5. Famotidine 20 mg.  6. Senna.  7. Albuterol nebulizer solution used p.r.n.   REVIEW OF SYSTEMS:  Weight gain, pauses in breathing during sleep.  Sleepiness with inactivity during the day, controlled seizures.   PAST HISTORY:  Down syndrome.  Sleep apnea.  Hypoxemia.  Suspected  previous recurrent aspiration.   SOCIAL HISTORY:  No alcohol or tobacco.  Not married.  No children.  Living in the group home.   FAMILY HISTORY:  Father is an ophthalmologist.  No available detailed  medical history for family.   OBJECTIVE:  Wheelchair.  BP 116/78, pulse regular 92, room air saturation 96%.  Down syndrome, tremor, nystagmus.  Right foot in walking boot.  HEENT:  Narrow high-arched palate.  Posterior spacing 3/4.  Nasal airway  is not obviously obstructed.  There is no thyroid enlargement or  stridor.  NECK:  Thick.  Unlabored quiet breathing.  Regular heart sounds. No cough.  No edema.   IMPRESSION:  1. He is likely  to have obstructive sleep apnea, but the amount is      unclear because of his limited sleep time on his formal study.  2. Down syndrome.   PLAN:  1. We have no evidence that he really needs oxygen now, so we are      stopping that, which makes his mother happy.  2. Overnight oxymetry on room air, which will give [place] information both      on his oxygenation status and also any suspicion of obstructive      sleep apnea.  3. Will schedule return in about a month to finish his review.  I      appreciate the chance to meet him and would be happy to discuss the      care.     Clinton D. Maple Hudson, MD, [facility], FACP  Electronically Signed    CDY/MedQ  DD: 09/02/2006  DT: 09/03/2006  Job #: [id number]   cc:   Dr. Dione Housekeeper

## 2010-10-16 NOTE — Discharge Summary (Signed)
[name], [name]          ACCOUNT NO.:  [id number]   MEDICAL RECORD NO.:  [id number]          PATIENT TYPE:  INP   LOCATION:  5504                         FACILITY:  [facility]   PHYSICIAN:  Jene Every, M.D.    DATE OF BIRTH:  [birth year]   DATE OF ADMISSION:  10/01/2005  DATE OF DISCHARGE:  10/06/2005                                 DISCHARGE SUMMARY   DICTATED BY:  Roma Schanz, P.A.   ADMISSION DIAGNOSES:  1.  Down syndrome.  2.  Seizure disorder.  3.  Comminuted right elbow fracture.   DISCHARGE DIAGNOSES:  1.  Down syndrome.  2.  Seizure disorder.  3.  Comminuted right elbow fracture.  4.  Status post open reeducation internal fixation, right elbow.  5.  Persistent upper extremity edema with some radial nerve palsy.   HISTORY:  Mr. Glassco is a pleasant 45 year old gentleman who presents to  the emergency room with his guardian.  They say he tripped over a chair  landing on his right elbow.  He was evaluated in the emergency room by Dr.  Shelle Iron, which showed a small puncture wound from the comminuted fracture of  the right elbow.  [name] did consult with a hand specialist.  He was  taken immediately to the operating room for irrigation and debridement and  repair of elbow fracture.   CONSULTS:  PT/OT, case management, Dr. Mina Marble.   PROCEDURE:  The patient was taken to the OR on Oct 02, 2005 to undergo  irrigation and debridement and open reduction and internal fixation of  intercolumnar comminuted distal humerus fracture on the left for non-  fracture.   SURGEON:  Dr. Jene Every and Dr. Dairl Ponder.   ANESTHESIA:  General.   COMPLICATIONS:  None.   LABORATORY DATA:  Preoperative CBC showed a white cell count of 7.7,  hemoglobin 13.0, hematocrit 37.3.  Hemoglobin is repeated at the level of  10.1, hematocrit 30.0.  Routine chemistries were obtained, which were all  within normal range.  No repeat labs were obtained during the hospital  course.   Preoperative EKG showed normal sinus rhythm and complete right bundle-branch  block was noted.   HOSPITAL COURSE:  Patient was admitted again and taken directly to the OR.  Underwent the above stated procedure without significant difficulty.  He was  then transferred back onto the floor for postoperative care.  Postoperatively, the patient did have pretty significant edema to the upper  extremity.  He did have some weakness in the radial nerve distribution.  He  had problems with finger extension.  Pulses were equal.  Patient did have  difficulty following commands as far as assessing his radial nerve function.  According to his guarding, he was fairly comfortable and not requiring too  much pain medication.  PT/OT was consulted, as well as biotech to assist  with swelling.  Patient continued to have issues with swelling in upper  extremity.  We did recommend strict elevation in a mission type sling.  Neurovascular function remained unchanged.  Discharge planning was  initiated.  [name] did perform daily wound check, as well  as  neurovascular checks to the arm.  It was felt by postoperative report, the  patient was stable to be discharged home with his family.  Patient was  discharged home with family with approximate physical therapy and  occupational therapy.  He is to continue with the plan as prescribed by  biotech.  He will need daily wound checks to prevent wound breakdown, good  nutrition, strict elevation to the upper extremity, encouraged motion of the  digits, appropriate followup was discussed.  He is to come to see [name]  in 1 week for exam and suture removal.   MEDICATIONS:  Resume all home medications.  Vicodin p.r.n. pain.   DIET:  As tolerated.   CONDITION ON DISCHARGE:  Stable.   FINAL DIAGNOSIS:  Status post open reeducation internal fixation, right  elbow      Jene Every, M.D.  Electronically Signed     JB/MEDQ  D:  12/17/2005  T:   12/17/2005  Job:  [id number]

## 2010-10-16 NOTE — Op Note (Signed)
[name], [name]          ACCOUNT NO.:  [id number]   MEDICAL RECORD NO.:  [id number]          PATIENT TYPE:  EMS   LOCATION:  MAJO                         FACILITY:  [facility]   PHYSICIAN:  Artist Pais. Weingold, M.D.DATE OF BIRTH:  08/12/1965   DATE OF PROCEDURE:  10/01/2005  DATE OF DISCHARGE:                                 OPERATIVE REPORT   PREOPERATIVE DIAGNOSIS:  Right elbow grade 1 open intercondylar distal  humerus fracture and olecranon fracture.   POSTOPERATIVE DIAGNOSIS:  Right elbow grade 1 intercondylar distal humerus  fracture and olecranon fracture.   PROCEDURE:  Open reduction and internal fixation of intercondylar comminuted  distal humeral fracture and olecranon fracture.   SURGEONS:  Artist Pais. Mina Marble, M.D., and Jene Every, M.D.   ASSISTANT:  None.   ANESTHESIA:  General.   TOURNIQUET TIME:  Two hours, followed by 10 minutes of down time, followed  by 20 minutes of second up time.   Position was lateral decubitus.  No complications, no drains.   OPERATIVE REPORT:  The patient was taken to the operating room, where after  the induction of adequate general anesthesia he was placed in the lateral  position with the right side up.  He was then prepped and draped in the  usual sterile fashion.  An Esmarch was used to exsanguinate the limb.  The  tourniquet was then inflated to 250 mmHg.  At this point in time an incision  was made overlying the posterior aspect of the distal humeral area and  proximal ulna, incorporating the an open wound.  The skin was incised.  The  dissection was carried down to the proximal ulna.  The fracture was  identified and carefully displaced using an osteotome.  After this was done,  the ulnar nerve was carefully identified and released in the cubital tunnel.  Next the medial and lateral aspects of the triceps were incised and the  olecranon process fracture fragment as well as the triceps was extended  proximally to  expose the intercondylar fracture.  The ulnar nerve was  released approximately 10 cm above the elbow joint and carefully tagged and  retracted.  At this point in time the fracture fragments were carefully  identified and debrided of clots and a provisional reduction was performed  using reduction clamps.  Next an intercondylar screw was placed from lateral  to medial to help fix the fragments.  Once this was done, a posterior  lateral plate was placed on the distal fragment and was secured with  cortical screws and then secured proximally onto the proximally fragments.  This procedure was repeated using a medial precontoured plate from the  Acumed set, including a large screw across the trochlea to fix the distal  fragments.  Most of the screws were placed from the medial to lateral side,  thus completing the reconstruction.  At the end of the reconstruction the  olecranon fossa had been recreated and there was a near-anatomic reduction  of the distal fragments to the shaft fragment.  Intraoperative fluoroscopy  revealed a near-anatomic reduction in both the AP, lateral and oblique view.  The  wound was then thoroughly irrigated.  The tourniquet was released.  After 10 minutes, the tourniquet was reinflated after the arm was  exsanguinated.  The olecranon fracture was fixed with an olecranon place  from the Congruent set using a series of 3.2 mm screws.  This was the  followed by repair of the triceps fascia and the muscle overlying the  olecranon process.  After this was done, the wound was closed with 2-0  Vicryl and staples on the skin.  This was all preceded by a thorough  debridement of the open wound.  The patient was then placed in a sterile  dressing of Xeroform, 4 x 4's and a posterior __________ splint with the  forearm in neutral and the elbow flexed to 90 degrees.  The patient  tolerated the procedure well, went to recovery in stable fashion.      Artist Pais Mina Marble, M.D.   Electronically Signed     MAW/MEDQ  D:  10/02/2005  T:  10/04/2005  Job:  [id number]   cc:   Jene Every, M.D.  Fax: [phone]

## 2010-11-11 ENCOUNTER — Ambulatory Visit
Admission: RE | Admit: 2010-11-11 | Discharge: 2010-11-11 | Disposition: A | Discharge status: Home / Self Care | Payer: Medicaid Other | Source: Ambulatory Visit | Attending: Family Medicine | Admitting: Family Medicine

## 2010-11-11 ENCOUNTER — Other Ambulatory Visit: Payer: Self-pay | Admitting: Family Medicine

## 2010-11-11 DIAGNOSIS — M25473 Effusion, unspecified ankle: Secondary | ICD-10-CM

## 2010-11-11 DIAGNOSIS — L539 Erythematous condition, unspecified: Secondary | ICD-10-CM

## 2011-07-06 ENCOUNTER — Encounter: Payer: Self-pay | Admitting: Internal Medicine

## 2011-07-07 ENCOUNTER — Ambulatory Visit (INDEPENDENT_AMBULATORY_CARE_PROVIDER_SITE_OTHER): Payer: Medicare Other | Admitting: Internal Medicine

## 2011-07-07 ENCOUNTER — Encounter: Payer: Self-pay | Admitting: Internal Medicine

## 2011-07-07 VITALS — BP 120/88 | HR 82 | Ht 63.0 in | Wt 188.4 lb

## 2011-07-07 DIAGNOSIS — Q909 Down syndrome, unspecified: Secondary | ICD-10-CM

## 2011-07-07 DIAGNOSIS — G4733 Obstructive sleep apnea (adult) (pediatric): Secondary | ICD-10-CM

## 2011-07-07 NOTE — Progress Notes (Signed)
07/07/11- 45 yoM Down's Syndrome, followed for OSA LOV-07/06/10 A staff member from his nursing home is here with him. She reports that he will pull mask off during the night and staff promptly replaces it. He then sleeps the rest of the night with no problem wearing a mask essentially all night every night. CPAP 10/[facility]. She denies that he has significant daytime sleepiness or breakthrough snoring.  ROS-see HPI- as reported by staff assistant with him Constitutional:   No-   weight loss, night sweats, fevers, chills, fatigue, lassitude. HEENT:   No-  headaches, difficulty swallowing, tooth/dental problems, sore throat,       No-  sneezing, itching, ear ache, nasal congestion, post nasal drip,  CV:  No-   chest pain, orthopnea, PND, swelling in lower extremities, anasarca, dizziness, palpitations Resp: No-   shortness of breath with exertion or at rest.              No-   productive cough,  No non-productive cough,  No- coughing up of blood.              No-   change in color of mucus.  No- wheezing.   Skin: No-   rash or lesions. GI:  No-   heartburn, indigestion, abdominal pain, nausea, vomiting, diarrhea,                 change in bowel habits, loss of appetite GU: No-   . MS:  No-   joint pain or swelling.  No- decreased range of motion.  No- back pain. Neuro-     nothing unusual Psych:  No- change in mood or affect. No depression or anxiety.  No memory loss.   OBJ- Physical Exam General- Alert, Oriented, Affect-pleasant, Distress- none acute, non-verbal Skin- rash-none, lesions- none, excoriation- none Lymphadenopathy- none Head- atraumatic            Eyes- Gross vision intact, PERRLA, conjunctivae and secretions clear            Ears- Hearing, grossly normal            Nose- Clear, no-Septal dev, mucus, polyps, erosion, perforation             Throat- Mallampati III , mucosa clear , drainage- none, tonsils- atrophic Neck- flexible , trachea midline, no stridor , thyroid nl,  carotid no bruit Chest - symmetrical excursion , unlabored           Heart/CV- RRR , no murmur , no gallop  , no rub, nl s1 s2                           - JVD- none , edema- none, stasis changes- none, varices- none           Lung- clear to P&A, wheeze- none, cough- none , dullness-none, rub- none           Chest wall-  Abd- tender-no, distended-no, bowel sounds-present, HSM- no Br/ Gen/ Rectal- Not done, not indicated Extrem- cyanosis- none, clubbing, none, atrophy- none, strength- nl Neuro- severely retarded, nystagmus, gesturing w/ left hand

## 2011-07-07 NOTE — Patient Instructions (Signed)
Continue CPAP 10- goal is all night, every night. Call [facility] if there are problems with the mask or machine.  Please call here as needed

## 2011-07-10 NOTE — Assessment & Plan Note (Signed)
[name] is not able to communicate effectively but seems comfortable and well cared for as near as I can tell. There are no pressure marks on his face that would indicate mask being strapped onto tightly

## 2011-07-10 NOTE — Assessment & Plan Note (Signed)
As reported by his nursing home staff, compliance is very good and control seems satisfactory with no changes needed.

## 2012-03-12 ENCOUNTER — Emergency Department (HOSPITAL_COMMUNITY): Payer: Medicare Other

## 2012-03-12 ENCOUNTER — Encounter (HOSPITAL_COMMUNITY): Payer: Self-pay | Admitting: Emergency Medicine

## 2012-03-12 ENCOUNTER — Emergency Department (HOSPITAL_COMMUNITY)
Admission: EM | Admit: 2012-03-12 | Discharge: 2012-03-12 | Disposition: A | Discharge status: Home / Self Care | Payer: Medicare Other | Attending: Emergency Medicine | Admitting: Emergency Medicine

## 2012-03-12 DIAGNOSIS — Q909 Down syndrome, unspecified: Secondary | ICD-10-CM | POA: Insufficient documentation

## 2012-03-12 DIAGNOSIS — S90129A Contusion of unspecified lesser toe(s) without damage to nail, initial encounter: Secondary | ICD-10-CM | POA: Insufficient documentation

## 2012-03-12 DIAGNOSIS — X58XXXA Exposure to other specified factors, initial encounter: Secondary | ICD-10-CM | POA: Insufficient documentation

## 2012-03-12 DIAGNOSIS — G4733 Obstructive sleep apnea (adult) (pediatric): Secondary | ICD-10-CM | POA: Insufficient documentation

## 2012-03-12 DIAGNOSIS — R233 Spontaneous ecchymoses: Secondary | ICD-10-CM | POA: Insufficient documentation

## 2012-03-12 NOTE — ED Notes (Signed)
Pt from group home.  Staff reports bruising to L great toe and 2nd toe that they noticed today.  No known injury.

## 2012-03-12 NOTE — ED Provider Notes (Signed)
History   This chart was scribed for Derwood Kaplan, MD by Charolett Bumpers . The patient was seen in room TR06C/TR06C. Patient's care was started at 1855.  CSN: [id number] Arrival date & time 03/12/12  1836  First MD Initiated Contact with Patient 03/12/12 1855      Chief Complaint  Patient presents with  . Toe Pain   Level 5 Caveat: Down syndrome and mental retardation.   HPI Comments: LEVEL 5 CAVEAT - PT HAS MENTAL RETARDATION, UNABLE TO PROVIDE HX.  [name] is a 46 y.o. male who presents to the Emergency Department complaining of bruising to his left toes. Pt is from a group home. Staff reports brusing to the left great toe and 2nd toe that they noticed earlier today. She denies any known injury. Pt does not respond to questions about the injury. History is limited due to pt's h/o Down syndrome and mental retardation.   The history is provided by a caregiver. The history is limited by a developmental delay. No language interpreter was used.    Past Medical History  Diagnosis Date  . Down's syndrome   . Obstructive sleep apnea (adult) (pediatric)   . Hypothyroid     Past Surgical History  Procedure Date  . Right elbow surgery     No family history on file.  History  Substance Use Topics  . Smoking status: Never Smoker   . Smokeless tobacco: Not on file  . Alcohol Use: No      Review of Systems  Unable to perform ROS: Other  Down's syndrome and mental retardation  Allergies  Review of patient's allergies indicates no known allergies.  Home Medications   Current Outpatient Rx  Name Route Sig Dispense Refill  . ACETAMINOPHEN 325 MG PO TABS Oral Take 650 mg by mouth every 4 (four) hours as needed.    . ALBUTEROL SULFATE (2.5 MG/3ML) 0.083% IN NEBU Nebulization Take 2.5 mg by nebulization every 6 (six) hours as needed.    . ASPIRIN 81 MG PO TABS Oral Take 81 mg by mouth daily.    Marland Kitchen DIVALPROEX SODIUM 500 MG PO TBEC Oral Take 500 mg by mouth 3  (three) times daily.    Marland Kitchen FAMOTIDINE 20 MG PO TABS Oral Take 20 mg by mouth 2 (two) times daily.    Marland Kitchen PRO-STAT 64 PO LIQD Oral Take 30 mLs by mouth daily.    Marland Kitchen LEVOTHYROXINE SODIUM 100 MCG PO TABS Oral Take 100 mcg by mouth daily.    Marland Kitchen NABUMETONE 750 MG PO TABS Oral Take 750 mg by mouth 2 (two) times daily.    . SENNA 8.6 MG PO TABS Oral Take 1 tablet by mouth at bedtime.      BP 150/94  Pulse 92  Temp 98.6 F (37 C) (Oral)  Resp 18  SpO2 98%  Physical Exam  Nursing note and vitals reviewed. Constitutional: He is oriented to person, place, and time. He appears well-developed and well-nourished. No distress.  HENT:  Head: Normocephalic and atraumatic.  Eyes: EOM are normal.  Neck: Neck supple. No tracheal deviation present.  Cardiovascular: Normal rate, regular rhythm, normal heart sounds and intact distal pulses.   No murmur heard. Pulses:      Dorsalis pedis pulses are 2+ on the right side, and 2+ on the left side.  Pulmonary/Chest: Effort normal and breath sounds normal. No respiratory distress. He has no wheezes. He has no rhonchi. He has no rales.  Lungs are clear to auscultation bilaterally.   Abdominal: Soft. There is no tenderness.  Musculoskeletal: Normal range of motion.  Neurological: He is alert and oriented to person, place, and time.  Skin: Skin is warm and dry.       On left foot, ecchymosis of the great toe and base of 2nd toe. No warmth to touch. No grimacing of the pt upon palpation. He does grimace to painful stimulation.   Psychiatric: He has a normal mood and affect. His behavior is normal.    ED Course  Procedures (including critical care time)  DIAGNOSTIC STUDIES: Oxygen Saturation is 98% on room air, normal by my interpretation.    COORDINATION OF CARE:  19:15-Discussed planned course of treatment with the caregiver, who is agreeable at this time. Waiting on imaging results.    Labs Reviewed - No data to display No results found.   No  diagnosis found.    MDM  Medical screening examination/treatment/procedure(s) were performed by me as the supervising physician. Scribe service was utilized for documentation only.  Pt comes in with cc of toe ECCHYMOSES. Pt has hx of downs syndrome, he has no anticoagulation on board, he has no murmur on my exam (and he has downs, so i heard closely, expecting one), he has no fevers, and no rashes else where. The toe is not warm to touch, and neither is it cold to touch relative to rest of the foot. And he has cap refill < 3 seconds. Unable to check sensation due to patient non compliance.  My impression is that this is hematoma from an unknown trauma. Based on hx, exam, i dont think this is an embolic event, or and ischemic process. Pt is living at a rehab home, so we will get serial exams by them.  Xray toe ordered.  Derwood Kaplan, MD 03/12/12 2028

## 2012-03-12 NOTE — ED Notes (Signed)
The pt is mentally retarded and he is from a group home transporter with him.  He has a bruise on his let foot and it extends down through his toes.  He does not speak and the papers with him  Do  Not say how the bruise occurred.  Apparently it was noticed today

## 2012-07-06 ENCOUNTER — Encounter: Payer: Self-pay | Admitting: Internal Medicine

## 2012-07-06 ENCOUNTER — Ambulatory Visit (INDEPENDENT_AMBULATORY_CARE_PROVIDER_SITE_OTHER): Payer: Medicare Other | Admitting: Internal Medicine

## 2012-07-06 VITALS — BP 122/68 | HR 76 | Ht 63.0 in | Wt 186.8 lb

## 2012-07-06 DIAGNOSIS — G4733 Obstructive sleep apnea (adult) (pediatric): Secondary | ICD-10-CM

## 2012-07-06 NOTE — Patient Instructions (Addendum)
We can continue CPAP 10/ [facility]  The rash on his face suggests the mask may be just a little too tight at night.  If there is any question about mask fit or how much to tighten it, then suggest you call [facility] and have them send someone out to take a look for you.  Please call here as needed

## 2012-07-06 NOTE — Progress Notes (Signed)
07/07/11- 45 yoM Down's Syndrome, followed for OSA LOV-07/06/10 A staff member from his nursing home is here with him. She reports that he will pull mask off during the night and staff promptly replaces it. He then sleeps the rest of the night with no problem wearing a mask essentially all night every night. CPAP 10/[facility]. She denies that he has significant daytime sleepiness or breakthrough snoring.  07/06/12-46 yoM Down's Syndrome, followed for OSA FOLLOWS FOR: wears CPAP 10/ [facility] every night-staff keeps check on patient to make sure mask stays on him The aide today is the same who was here last year. She says he is no longer drowsy in the day since using CPAP consistently, but now he has facial rash consistent with shape of the mask.  ROS-see HPI- as reported by staff assistant with him Constitutional:   No-   weight loss, night sweats, fevers, chills, fatigue, lassitude. HEENT:   No-  headaches, difficulty swallowing, tooth/dental problems, sore throat,       No-  sneezing, itching, ear ache, nasal congestion, post nasal drip,  CV:  No-   chest pain, orthopnea, PND, swelling in lower extremities, anasarca, dizziness, palpitations Resp: No-   shortness of breath with exertion or at rest.              No-   productive cough,  No non-productive cough,  No- coughing up of blood.              No-   change in color of mucus.  No- wheezing.   Skin: No-   rash or lesions. GI:  No-   heartburn, indigestion, abdominal pain, nausea, vomiting,  GU: No-   . MS:  No-   joint pain or swelling.  No- decreased range of motion.  No- back pain. Neuro-     nothing unusual Psych:  No- change in mood or affect. No depression or anxiety.  No memory loss.  OBJ- Physical Exam General- Alert, Oriented, Affect-pleasant, Distress- none acute, non-verbal. Overweight Skin- Rash c/w pressure from a full face CPAP mask. No breakdown or strap pressure marks. Lymphadenopathy- none Head- atraumatic            Eyes-  Gross vision intact, PERRLA, conjunctivae and secretions clear            Ears- Hearing, grossly normal            Nose- Clear, no-Septal dev, mucus, polyps, erosion, perforation             Throat- Mallampati III-IV , mucosa clear , drainage- none, tonsils- atrophic Neck- flexible , trachea midline, no stridor , thyroid nl, carotid no bruit Chest - symmetrical excursion , unlabored           Heart/CV- RRR , no murmur , no gallop  , no rub, nl s1 s2                           - JVD- none , edema- none, stasis changes- none, varices- none           Lung- clear to P&A, wheeze- none, cough- none , dullness-none, rub- none           Chest wall-  Abd-  Br/ Gen/ Rectal- Not done, not indicated Extrem- cyanosis- none, clubbing, none, atrophy- none, strength- nl Neuro- severely retarded, nystagmus, gesturing w/ left hand. Few grunts.

## 2012-07-06 NOTE — Assessment & Plan Note (Signed)
Pressure and compliance are good. Staff advised to loosen mask just a little, and to call [facility] if any questions.

## 2013-01-30 ENCOUNTER — Other Ambulatory Visit: Payer: Self-pay | Admitting: Orthopedic Surgery

## 2013-01-31 ENCOUNTER — Encounter (HOSPITAL_BASED_OUTPATIENT_CLINIC_OR_DEPARTMENT_OTHER): Payer: Self-pay | Admitting: *Deleted

## 2013-02-01 ENCOUNTER — Encounter (HOSPITAL_BASED_OUTPATIENT_CLINIC_OR_DEPARTMENT_OTHER): Payer: Self-pay | Admitting: *Deleted

## 2013-02-01 NOTE — Progress Notes (Signed)
Talked with nurses at group home-they will have pt npo p mn 02/04/13 Take all am meds with only water Mother is out of country and will be back this weekend and will be here am of surgery to sign permit according to rhonda english-nurse at group home Pt very cooperative

## 2013-02-01 NOTE — Progress Notes (Signed)
Info from group home-pt to use cpap post op

## 2013-02-04 NOTE — Anesthesia Preprocedure Evaluation (Addendum)
Anesthesia Evaluation  Patient identified by MRN, date of birth, ID band Patient awake    Reviewed: Allergy & Precautions  Airway  TM Distance: >3 FB Neck ROM: Full    Dental  (+) Teeth Intact and Dental Advisory Given   Pulmonary sleep apnea and Continuous Positive Airway Pressure Ventilation ,  breath sounds clear to auscultation        Cardiovascular Rhythm:Regular Rate:Normal     Neuro/Psych    GI/Hepatic   Endo/Other  Hypothyroidism   Renal/GU      Musculoskeletal   Abdominal   Peds  Hematology   Anesthesia Other Findings Downs syndrome. Unwilling to open mouth, possibly due to lack of understanding what was being asked.  Based on this behavior, I declined to attempt a peripheral nerve block.    Reproductive/Obstetrics                          Anesthesia Physical Anesthesia Plan  ASA: III  Anesthesia Plan: General   Post-op Pain Management:    Induction: Intravenous  Airway Management Planned: Oral ETT  Additional Equipment:   Intra-op Plan:   Post-operative Plan: Extubation in OR  Informed Consent: I have reviewed the patients History and Physical, chart, labs and discussed the procedure including the risks, benefits and alternatives for the proposed anesthesia with the patient or authorized representative who has indicated his/her understanding and acceptance.   Dental advisory given  Plan Discussed with: CRNA, Anesthesiologist and Surgeon  Anesthesia Plan Comments:         Anesthesia Quick Evaluation

## 2013-02-05 ENCOUNTER — Encounter (HOSPITAL_BASED_OUTPATIENT_CLINIC_OR_DEPARTMENT_OTHER): Payer: Self-pay | Admitting: Certified Registered Nurse Anesthetist

## 2013-02-05 ENCOUNTER — Encounter (HOSPITAL_BASED_OUTPATIENT_CLINIC_OR_DEPARTMENT_OTHER): Payer: Self-pay | Admitting: Anesthesiology

## 2013-02-05 ENCOUNTER — Ambulatory Visit (HOSPITAL_COMMUNITY): Payer: Medicare Other

## 2013-02-05 ENCOUNTER — Ambulatory Visit (HOSPITAL_BASED_OUTPATIENT_CLINIC_OR_DEPARTMENT_OTHER)
Admission: RE | Admit: 2013-02-05 | Discharge: 2013-02-05 | Disposition: A | Discharge status: Other Acute Inpatient Hospital | Payer: Medicare Other | Source: Ambulatory Visit | Attending: Orthopedic Surgery | Admitting: Orthopedic Surgery

## 2013-02-05 ENCOUNTER — Encounter (HOSPITAL_BASED_OUTPATIENT_CLINIC_OR_DEPARTMENT_OTHER)
Admission: RE | Disposition: A | Discharge status: Nursing Home (Includes ICF) | Payer: Self-pay | Source: Ambulatory Visit | Attending: Orthopedic Surgery

## 2013-02-05 ENCOUNTER — Ambulatory Visit (HOSPITAL_BASED_OUTPATIENT_CLINIC_OR_DEPARTMENT_OTHER): Payer: Medicare Other | Admitting: Anesthesiology

## 2013-02-05 DIAGNOSIS — E039 Hypothyroidism, unspecified: Secondary | ICD-10-CM | POA: Insufficient documentation

## 2013-02-05 DIAGNOSIS — S42463A Displaced fracture of medial condyle of unspecified humerus, initial encounter for closed fracture: Secondary | ICD-10-CM | POA: Insufficient documentation

## 2013-02-05 DIAGNOSIS — E119 Type 2 diabetes mellitus without complications: Secondary | ICD-10-CM | POA: Insufficient documentation

## 2013-02-05 DIAGNOSIS — Z8614 Personal history of Methicillin resistant Staphylococcus aureus infection: Secondary | ICD-10-CM | POA: Insufficient documentation

## 2013-02-05 DIAGNOSIS — X58XXXA Exposure to other specified factors, initial encounter: Secondary | ICD-10-CM | POA: Insufficient documentation

## 2013-02-05 DIAGNOSIS — Q909 Down syndrome, unspecified: Secondary | ICD-10-CM | POA: Insufficient documentation

## 2013-02-05 DIAGNOSIS — G4730 Sleep apnea, unspecified: Secondary | ICD-10-CM | POA: Insufficient documentation

## 2013-02-05 DIAGNOSIS — Y929 Unspecified place or not applicable: Secondary | ICD-10-CM | POA: Insufficient documentation

## 2013-02-05 DIAGNOSIS — G40909 Epilepsy, unspecified, not intractable, without status epilepticus: Secondary | ICD-10-CM | POA: Insufficient documentation

## 2013-02-05 HISTORY — DX: Problems related to living in residential institution: Z59.3

## 2013-02-05 HISTORY — PX: ORIF HUMERUS FRACTURE: SHX2126

## 2013-02-05 HISTORY — DX: Other specified health status: Z78.9

## 2013-02-05 HISTORY — DX: Unspecified convulsions: R56.9

## 2013-02-05 SURGERY — OPEN REDUCTION INTERNAL FIXATION (ORIF) DISTAL HUMERUS FRACTURE
Anesthesia: General | Site: Elbow | Laterality: Left | Wound class: Clean

## 2013-02-05 MED ORDER — LACTATED RINGERS IV SOLN: INTRAVENOUS | Status: DC

## 2013-02-05 MED ORDER — MIDAZOLAM HCL 5 MG/5ML IJ SOLN
INTRAMUSCULAR | Status: DC | PRN
  Administered 2013-02-05: 2 mg via INTRAVENOUS

## 2013-02-05 MED ORDER — 0.9 % SODIUM CHLORIDE (POUR BTL) OPTIME
TOPICAL | Status: DC | PRN
  Administered 2013-02-05: 1000 mL

## 2013-02-05 MED ORDER — LACTATED RINGERS IV SOLN
INTRAVENOUS | Status: DC
  Administered 2013-02-05 (×2): via INTRAVENOUS

## 2013-02-05 MED ORDER — PROPOFOL 10 MG/ML IV BOLUS
INTRAVENOUS | Status: DC | PRN
  Administered 2013-02-05: 250 mg via INTRAVENOUS

## 2013-02-05 MED ORDER — SUCCINYLCHOLINE CHLORIDE 20 MG/ML IJ SOLN
INTRAMUSCULAR | Status: DC | PRN
  Administered 2013-02-05: 100 mg via INTRAVENOUS

## 2013-02-05 MED ORDER — CEFAZOLIN SODIUM-DEXTROSE 2-3 GM-% IV SOLR
2.0000 g | INTRAVENOUS | Status: AC
  Administered 2013-02-05: 2 g via INTRAVENOUS

## 2013-02-05 MED ORDER — FENTANYL CITRATE 0.05 MG/ML IJ SOLN: 50.0000 ug | INTRAMUSCULAR | Status: DC | PRN

## 2013-02-05 MED ORDER — BUPIVACAINE-EPINEPHRINE PF 0.5-1:200000 % IJ SOLN
INTRAMUSCULAR | Status: DC | PRN
  Administered 2013-02-05: 10 mL

## 2013-02-05 MED ORDER — HYDROCODONE-ACETAMINOPHEN 5-325 MG PO TABS: 1.0000 | ORAL_TABLET | Freq: Four times a day (QID) | ORAL | Status: DC | PRN

## 2013-02-05 MED ORDER — ONDANSETRON HCL 4 MG/2ML IJ SOLN
INTRAMUSCULAR | Status: DC | PRN
  Administered 2013-02-05: 4 mg via INTRAVENOUS

## 2013-02-05 MED ORDER — MIDAZOLAM HCL 2 MG/2ML IJ SOLN: 1.0000 mg | INTRAMUSCULAR | Status: DC | PRN

## 2013-02-05 MED ORDER — VANCOMYCIN HCL 1000 MG IV SOLR
1000.0000 mg | INTRAVENOUS | Status: DC | PRN
  Administered 2013-02-05: 1000 mg via INTRAVENOUS

## 2013-02-05 MED ORDER — LIDOCAINE HCL (CARDIAC) 20 MG/ML IV SOLN
INTRAVENOUS | Status: DC | PRN
  Administered 2013-02-05: 60 mg via INTRAVENOUS

## 2013-02-05 MED ORDER — FENTANYL CITRATE 0.05 MG/ML IJ SOLN
INTRAMUSCULAR | Status: DC | PRN
  Administered 2013-02-05: 100 ug via INTRAVENOUS
  Administered 2013-02-05: 25 ug via INTRAVENOUS

## 2013-02-05 SURGICAL SUPPLY — 80 items
BANDAGE GAUZE ELAST BULKY 4 IN (GAUZE/BANDAGES/DRESSINGS) ×4 IMPLANT
BIT DRILL 2.5X2.75 QC CALB (BIT) ×1 IMPLANT
BIT DRILL CALIBRATED 2.7 (BIT) ×1 IMPLANT
BLADE MINI RND TIP GREEN BEAV (BLADE) IMPLANT
BLADE SURG 15 STRL LF DISP TIS (BLADE) ×1 IMPLANT
BLADE SURG 15 STRL SS (BLADE) ×4
BNDG CMPR 9X4 STRL LF SNTH (GAUZE/BANDAGES/DRESSINGS) ×1
BNDG COHESIVE 4X5 TAN STRL (GAUZE/BANDAGES/DRESSINGS) ×2 IMPLANT
BNDG COHESIVE 6X5 TAN STRL LF (GAUZE/BANDAGES/DRESSINGS) IMPLANT
BNDG ESMARK 4X9 LF (GAUZE/BANDAGES/DRESSINGS) ×2 IMPLANT
BRUSH SCRUB EZ PLAIN DRY (MISCELLANEOUS) ×1 IMPLANT
CHLORAPREP W/TINT 26ML (MISCELLANEOUS) ×2 IMPLANT
CORDS BIPOLAR (ELECTRODE) ×2 IMPLANT
COVER MAYO STAND STRL (DRAPES) ×3 IMPLANT
COVER SURGICAL LIGHT HANDLE (MISCELLANEOUS) ×1 IMPLANT
COVER TABLE BACK 60X90 (DRAPES) ×2 IMPLANT
CUFF TOURNIQUET SINGLE 18IN (TOURNIQUET CUFF) ×1 IMPLANT
CUFF TOURNIQUET SINGLE 24IN (TOURNIQUET CUFF) IMPLANT
DRAIN PENROSE 1/4X12 LTX STRL (WOUND CARE) ×1 IMPLANT
DRAPE C-ARM 42X72 X-RAY (DRAPES) ×2 IMPLANT
DRAPE EXTREMITY T 121X128X90 (DRAPE) ×2 IMPLANT
DRAPE SURG 17X23 STRL (DRAPES) ×1 IMPLANT
DRAPE U-SHAPE 47X51 STRL (DRAPES) ×1 IMPLANT
DRSG ADAPTIC 3X8 NADH LF (GAUZE/BANDAGES/DRESSINGS) ×1 IMPLANT
DRSG EMULSION OIL 3X3 NADH (GAUZE/BANDAGES/DRESSINGS) IMPLANT
ELECT REM PT RETURN 9FT ADLT (ELECTROSURGICAL) ×2
ELECTRODE REM PT RTRN 9FT ADLT (ELECTROSURGICAL) IMPLANT
GLOVE BIO SURGEON STRL SZ 6.5 (GLOVE) ×1 IMPLANT
GLOVE BIO SURGEON STRL SZ7.5 (GLOVE) ×2 IMPLANT
GLOVE BIOGEL PI IND STRL 7.0 (GLOVE) IMPLANT
GLOVE BIOGEL PI IND STRL 8 (GLOVE) ×1 IMPLANT
GLOVE BIOGEL PI INDICATOR 7.0 (GLOVE) ×1
GLOVE BIOGEL PI INDICATOR 8 (GLOVE) ×1
GLOVE EXAM NITRILE LRG STRL (GLOVE) ×1 IMPLANT
GOWN PREVENTION PLUS XLARGE (GOWN DISPOSABLE) ×3 IMPLANT
K-WIRE FIXATION 2.0X6 (WIRE) ×2
KWIRE FIXATION 2.0X6 (WIRE) IMPLANT
NDL HYPO 25X1 1.5 SAFETY (NEEDLE) IMPLANT
NEEDLE HYPO 25X1 1.5 SAFETY (NEEDLE) IMPLANT
NS IRRIG 1000ML POUR BTL (IV SOLUTION) ×2 IMPLANT
PACK BASIN DAY SURGERY FS (CUSTOM PROCEDURE TRAY) ×2 IMPLANT
PAD CAST 4YDX4 CTTN HI CHSV (CAST SUPPLIES) ×1 IMPLANT
PADDING CAST ABS 4INX4YD NS (CAST SUPPLIES) ×1
PADDING CAST ABS COTTON 4X4 ST (CAST SUPPLIES) IMPLANT
PADDING CAST COTTON 4X4 STRL (CAST SUPPLIES) ×2
PENCIL BUTTON HOLSTER BLD 10FT (ELECTRODE) ×2 IMPLANT
PLATE DIST HUM MEDIAL LT SM (Plate) ×1 IMPLANT
RUBBERBAND STERILE (MISCELLANEOUS) IMPLANT
SCREW CORT 3.5X26 (Screw) ×2 IMPLANT
SCREW CORT T15 26X3.5XST LCK (Screw) IMPLANT
SCREW LOCK CORT STAR 3.5X10 (Screw) ×1 IMPLANT
SCREW LOCK CORT STAR 3.5X16 (Screw) ×1 IMPLANT
SCREW LOCK CORT STAR 3.5X18 (Screw) ×1 IMPLANT
SCREW LOCK CORT STAR 3.5X20 (Screw) ×1 IMPLANT
SCREW LOCK CORT STAR 3.5X26 (Screw) ×1 IMPLANT
SCREW LOCK CORT STAR 3.5X30 (Screw) ×1 IMPLANT
SCREW LOCK CORT STAR 3.5X34 (Screw) ×1 IMPLANT
SCREW LOCK CORT STAR 3.5X60 (Screw) ×1 IMPLANT
SLEEVE SCD COMPRESS KNEE MED (MISCELLANEOUS) ×2 IMPLANT
SPLINT FAST PLASTER 5X30 (CAST SUPPLIES)
SPLINT PLASTER CAST FAST 5X30 (CAST SUPPLIES) IMPLANT
SPONGE GAUZE 4X4 12PLY (GAUZE/BANDAGES/DRESSINGS) ×2 IMPLANT
SPONGE LAP 18X18 X RAY DECT (DISPOSABLE) ×1 IMPLANT
SPONGE LAP 4X18 X RAY DECT (DISPOSABLE) ×1 IMPLANT
STAPLER VISISTAT 35W (STAPLE) ×1 IMPLANT
STOCKINETTE 4X48 STRL (DRAPES) ×2 IMPLANT
SUCTION FRAZIER TIP 10 FR DISP (SUCTIONS) ×1 IMPLANT
SUT VIC AB 2-0 CT1 27 (SUTURE) ×2
SUT VIC AB 2-0 CT1 TAPERPNT 27 (SUTURE) IMPLANT
SUT VIC AB 2-0 CT3 27 (SUTURE) ×2 IMPLANT
SUT VICRYL 4-0 PS2 18IN ABS (SUTURE) IMPLANT
SUT VICRYL RAPIDE 4/0 PS 2 (SUTURE) ×2 IMPLANT
SYR BULB 3OZ (MISCELLANEOUS) ×2 IMPLANT
SYRINGE 10CC LL (SYRINGE) IMPLANT
TOWEL OR 17X24 6PK STRL BLUE (TOWEL DISPOSABLE) ×4 IMPLANT
TOWEL OR NON WOVEN STRL DISP B (DISPOSABLE) ×1 IMPLANT
TUBE CONNECTING 20X1/4 (TUBING) ×1 IMPLANT
UNDERPAD 30X30 INCONTINENT (UNDERPADS AND DIAPERS) ×2 IMPLANT
WASHER 3.5MM (Orthopedic Implant) ×1 IMPLANT
YANKAUER SUCT BULB TIP NO VENT (SUCTIONS) ×1 IMPLANT

## 2013-02-05 NOTE — Anesthesia Postprocedure Evaluation (Signed)
  Anesthesia Post-op Note  Patient: [name]  Procedure(s) Performed: Procedure(s): OPEN REDUCTION INTERNAL FIXATION (ORIF) MEDIAL HUMERUS CONDYLE FRACTURE (Left)  Patient Location: PACU  Anesthesia Type:General  Level of Consciousness: awake, alert  and oriented  Airway and Oxygen Therapy: Patient Spontanous Breathing  Post-op Pain: mild  Post-op Assessment: Post-op Vital signs reviewed  Post-op Vital Signs: Reviewed  Complications: No apparent anesthesia complications

## 2013-02-05 NOTE — H&P (Signed)
[name] is an 47 y.o. male.   CC / Reason for Visit: Left elbow fracture HPI: This patient is a 47 year old male with Down syndrome, IDDM, hypothyroidism, seizure disorder, & sleep apnea who presents for evaluation of a left elbow injury.  He resides in a group home.  The exact date and circumstances of the injury remain unclear but he had x-rays performed yesterday revealing a fracture of the left distal humerus.  Of note he has a remote history of ORIF of a right elbow fracture several years ago reviewed he is accompanied today by his brother and a representative from his group home.  His father is a physician, and he just left yesterday out of the country, expected to be away for 1-2 weeks.  It is my understanding that the patient uses both hands for self-care in other activities and will sometimes communicate verbally  Past Medical History  Diagnosis Date  . Down's syndrome   . Hypothyroid   . Lives in group home     [facility]-[phone]-[phone]-Roxanne-nurse  . Obstructive sleep apnea (adult) (pediatric)     does use a cpap at night    Past Surgical History  Procedure Laterality Date  . Right elbow surgery      History reviewed. No pertinent family history. Social History:  reports that he has never smoked. He does not have any smokeless tobacco history on file. He reports that he does not drink alcohol or use illicit drugs.  Allergies: No Known Allergies  No prescriptions prior to admission    No results found for this or any previous visit (from the past 48 hour(s)). No results found.  Review of Systems  All other systems reviewed and are negative.    Height 5\' 3"  (1.6 m), weight 83.915 kg (185 lb). Physical Exam   Constitutional:  WD, WN, NAD HEENT:  NCAT, EOMI Neuro/Psych:  Alert & oriented to person, place, and time; appropriate mood & affect Lymphatic: No generalized UE edema or lymphadenopathy Extremities / MSK:  Both UE are normal with  respect to appearance, ranges of motion, joint stability, muscle strength/tone, sensation, & perfusion except as otherwise noted:  The left elbow has some ecchymosis on the medial aspect.  There is tenderness about the elbow.  He moves the arm somewhat freely and we'll place the hand up onto the examining table.  He appears to have intact sensibility and motor function distal to the elbow.  Fingers are warm with brisk cap refill.  Labs / Xrays:  No radiographic studies obtained today.  X-rays available for review indicate a displaced medial condyle fracture of the elbow on the left.  Assessment: Left humerus medial condyle fracture  Plan:  I discussed these findings with the patient's brother and representative from the group home.  Given his level of functioning, I recommended operative treatment to help restore optimal function and reduce the risk for chronic pain and arthritis.  I will discuss this further with the patient's father.  I have given the patient's brother my phone number so that he can contact the patient's father and we can communicate.  We will likely forgo operative treatment until the patient's parents have returned from their trip provided that that is not excessively long.  In the meantime he is placed into a sling with a backstrap and if this proves to be insufficient immobilization, they may bring him back for application of a posterior splint with Ace wrap.  Marlies Ligman A. 02/05/2013, 7:25 AM

## 2013-02-05 NOTE — Transfer of Care (Signed)
Immediate Anesthesia Transfer of Care Note  Patient: [name]  Procedure(s) Performed: Procedure(s): OPEN REDUCTION INTERNAL FIXATION (ORIF) MEDIAL HUMERUS CONDYLE FRACTURE (Left)  Patient Location: PACU  Anesthesia Type:General  Level of Consciousness: awake and sedated  Airway & Oxygen Therapy: Patient Spontanous Breathing and Patient connected to face mask oxygen  Post-op Assessment: Report given to PACU RN and Post -op Vital signs reviewed and stable  Post vital signs: Reviewed and stable  Complications: No apparent anesthesia complications

## 2013-02-05 NOTE — Anesthesia Procedure Notes (Signed)
Procedure Name: Intubation Date/Time: 02/05/2013 10:16 AM Performed by: Blythe Veach D Pre-anesthesia Checklist: Patient identified, Emergency Drugs available, Suction available and Patient being monitored Patient Re-evaluated:Patient Re-evaluated prior to inductionOxygen Delivery Method: Circle System Utilized Preoxygenation: Pre-oxygenation with 100% oxygen Intubation Type: IV induction Ventilation: Mask ventilation without difficulty Grade View: Grade I Tube type: Oral Tube size: 7.0 mm Number of attempts: 1 Airway Equipment and Method: stylet and Video-laryngoscopy Placement Confirmation: ETT inserted through vocal cords under direct vision,  positive ETCO2 and breath sounds checked- equal and bilateral Secured at: 21 cm Tube secured with: Tape Dental Injury: Teeth and Oropharynx as per pre-operative assessment  Difficulty Due To: Difficulty was anticipated and Difficult Airway- due to large tongue Comments: Vocal cords easily viewed atraumatic entry with glide scope, no manipulation of cervical region,

## 2013-02-05 NOTE — Op Note (Signed)
02/05/2013  12:04 PM  PATIENT:  [name]  47 y.o. male  PRE-OPERATIVE DIAGNOSIS:  Displaced left elbow medial condyle fracture  POST-OPERATIVE DIAGNOSIS:  Same  PROCEDURE:  ORIF left elbow medial condyle fracture; ulnar neuroplasty to find, protect, and preserve the nerve.  SURGEON: Cliffton Asters. Janee Morn, MD  PHYSICIAN ASSISTANT: None  ANESTHESIA:  general  SPECIMENS:  None  DRAINS:   None  PREOPERATIVE INDICATIONS:  [name] is a  47 y.o. male with a diagnosis of displaced left elbow medial condyle fracture, with alignment unsuitable for continued closed treatment.  The risks benefits and alternatives were discussed with the patient and his father preoperatively including but not limited to the risks of infection, bleeding, nerve injury, cardiopulmonary complications, the need for revision surgery, among others, and the patient verbalized understanding and consented to proceed.  OPERATIVE IMPLANTS: Biomet contoured elbow plates, medial plate, short  OPERATIVE FINDINGS: Anatomic reduction of fracture. Distal humerus appeared congenitally slightly misshapen.  OPERATIVE PROCEDURE: The patient was escorted to the operative theatre and placed in a supine position.  IV access was gained and general anesthesia was administered. Prophylactic antibiotics to consist of Ancef and vancomycin were administered given his remote history of MRSA infection on the right elbow. A surgical "time-out" was performed during which the planned procedure, proposed operative site, and the correct patient identity were compared to the operative consent and agreement confirmed by the circulating nurse according to current facility policy. The exposed skin was prepped with Chloraprep and draped in the usual sterile fashion.  A sterile tourniquet was applied. The limb was exsanguinated with an Esmarch bandage and the tourniquet inflated to approximately higher than systolic BP.  A  posterior incision was marked and made over the distal humerus, coursing medially over the expected course of the ulnar nerve  . Subcutaneous tissues were dissected with Bovie electrocautery. The ulnar nerve was identified and a Penrose drain placed around it. The drain was tied on itself so that there was no way to initiate hanging on the nerve. the medial border of the triceps was found and split so the triceps to be reflected laterally. The fracture site was identified. Early healing tissue was debrided so that the fracture to be anatomically reduced. It was reduced and pinned with a 2 mm K wire after it again copiously irrigated. The medial plate was selected and placed on the medial side of the bone. It was a short plate and this was sufficient up the shaft this fracture. In the slotted hole, a compressing screw was placed in the midportion of the plate and then the remaining holes were drilled and filled with locking screws. These were placed with direct visualization using 4 power loupe magnification and also fluoroscopic guidance. Final fluoroscopy images were obtained. The hardware appeared to be extra-articular. Motion was smooth and fluid. The fracture was stable. The tourniquet was released some additional hemostasis obtained and then the wound again irrigated. The skin was closed with 2-0 Vicryl deep dermal buried sutures followed by staples at the skin and half percent Marcaine with epinephrine was instilled in around the skin edges to help with postoperative pain control. A bulky dressing was applied without plaster and a sling was placed again. He was awakened and taken to the recovery room in stable condition, breathing spontaneously.  DISPOSITION: The patient will be discharged home today with typical instructions returning in approximately 2 weeks for reevaluation. At that time he should have new x-rays of the  left elbow, the dressing removed, staples removed, and we can determine whether or not  to place any other protective devices will remain in just a sling.

## 2013-02-06 LAB — POCT HEMOGLOBIN-HEMACUE: Hemoglobin: 14.2 g/dL (ref 13.0–17.0)

## 2013-02-08 ENCOUNTER — Encounter (HOSPITAL_BASED_OUTPATIENT_CLINIC_OR_DEPARTMENT_OTHER): Payer: Self-pay | Admitting: Orthopedic Surgery

## 2013-07-06 ENCOUNTER — Ambulatory Visit (INDEPENDENT_AMBULATORY_CARE_PROVIDER_SITE_OTHER): Payer: Medicare Other | Admitting: Internal Medicine

## 2013-07-06 ENCOUNTER — Encounter: Payer: Self-pay | Admitting: Internal Medicine

## 2013-07-06 VITALS — BP 114/68 | HR 81 | Ht 63.0 in | Wt 185.4 lb

## 2013-07-06 DIAGNOSIS — G4733 Obstructive sleep apnea (adult) (pediatric): Secondary | ICD-10-CM

## 2013-07-06 NOTE — Patient Instructions (Signed)
We can continue CPAP 10/ Advanced   Please call as needed 

## 2013-07-06 NOTE — Progress Notes (Signed)
07/07/11- 45 yoM Down's Syndrome, followed for OSA LOV-07/06/10 A staff member from his nursing home is here with him. She reports that he will pull mask off during the night and staff promptly replaces it. He then sleeps the rest of the night with no problem wearing a mask essentially all night every night. CPAP 10/Advanced. She denies that he has significant daytime sleepiness or breakthrough snoring.  07/06/12-46 yoM Down's Syndrome, followed for OSA FOLLOWS FOR: wears CPAP 10/ Advanced every night-staff keeps check on patient to make sure mask stays on him The aide today is the same who was here last year. She says he is no longer drowsy in the day since using CPAP consistently, but now he has facial rash consistent with shape of the mask.  07/06/13- 47 yoM Down's Syndrome, followed for OSA    Aide here FOLLOWS FOR: wears CPAP 10/ Advanced every night-does take the mask off sometimes in the night; staff keeps an eye on him to make sure he is wearing the mask though. The staff do notice that if he pulls his mask off during the night, he may be sleepier the next day. He usually keeps it on with no problem. It prevents snoring. They have no concerns. The mask has caused some skin irritation at times, controlled with "A&D ointment"  ROS-see HPI- as reported by staff assistant with him Constitutional:   No-   weight loss, night sweats, fevers, chills, fatigue, lassitude. HEENT:   No-  headaches, difficulty swallowing, tooth/dental problems, sore throat,       No-  sneezing, itching, ear ache, nasal congestion, post nasal drip,  CV:  No-   chest pain, orthopnea, PND, swelling in lower extremities, anasarca, dizziness, palpitations Resp: No-   shortness of breath with exertion or at rest.              No-   productive cough,  No non-productive cough,  No- coughing up of blood.              No-   change in color of mucus.  No- wheezing.   Skin: No-   rash or lesions. GI:  No-   heartburn, indigestion,  abdominal pain, nausea, vomiting,  GU: No-   . MS:  No-   joint pain or swelling.  Neuro-     nothing unusual Psych:  No- change in mood or affect. No depression or anxiety.  No memory loss.  OBJ- Physical Exam General- Alert, Oriented, Affect-pleasant, Distress- none acute, non-sensical. Overweight Skin- Rash c/w pressure from a full face CPAP mask. No breakdown or strap pressure marks. Lymphadenopathy- none Head- atraumatic            Eyes- Gross vision intact, PERRLA, conjunctivae and secretions clear            Ears- Hearing, grossly normal            Nose- Clear, no-Septal dev, mucus, polyps, erosion, perforation             Throat- Mallampati III-IV , mucosa clear , drainage- none, tonsils- atrophic Neck- flexible , trachea midline, no stridor , thyroid nl, carotid no bruit Chest - symmetrical excursion , unlabored           Heart/CV- RRR , no murmur , no gallop  , no rub, nl s1 s2                           -  JVD- none , edema- none, stasis changes- none, varices- none           Lung- clear to P&A, wheeze- none, cough- none , dullness-none, rub- none           Chest wall-  Abd-  Br/ Gen/ Rectal- Not done, not indicated Extrem- cyanosis- none, clubbing, none, atrophy- none, strength- nl Neuro- +severely retarded, nystagmus, gesturing w/ left hand. Few grunts.

## 2013-07-29 NOTE — Assessment & Plan Note (Signed)
The staff at his sheltered home facility do a good job maintaining his CPAP. By their report, he is doing very well at 10 CWP.

## 2014-07-01 ENCOUNTER — Emergency Department (HOSPITAL_COMMUNITY)
Admission: EM | Admit: 2014-07-01 | Discharge: 2014-07-01 | Disposition: A | Discharge status: Home / Self Care | Payer: Medicare Other | Attending: Emergency Medicine | Admitting: Emergency Medicine

## 2014-07-01 ENCOUNTER — Emergency Department (HOSPITAL_COMMUNITY): Payer: Medicare Other

## 2014-07-01 ENCOUNTER — Encounter (HOSPITAL_COMMUNITY): Payer: Self-pay | Admitting: Emergency Medicine

## 2014-07-01 DIAGNOSIS — Y9289 Other specified places as the place of occurrence of the external cause: Secondary | ICD-10-CM | POA: Diagnosis not present

## 2014-07-01 DIAGNOSIS — Z8669 Personal history of other diseases of the nervous system and sense organs: Secondary | ICD-10-CM | POA: Diagnosis not present

## 2014-07-01 DIAGNOSIS — S99921A Unspecified injury of right foot, initial encounter: Secondary | ICD-10-CM | POA: Diagnosis present

## 2014-07-01 DIAGNOSIS — S92324B Nondisplaced fracture of second metatarsal bone, right foot, initial encounter for open fracture: Secondary | ICD-10-CM | POA: Insufficient documentation

## 2014-07-01 DIAGNOSIS — E039 Hypothyroidism, unspecified: Secondary | ICD-10-CM | POA: Diagnosis not present

## 2014-07-01 DIAGNOSIS — Z7982 Long term (current) use of aspirin: Secondary | ICD-10-CM | POA: Diagnosis not present

## 2014-07-01 DIAGNOSIS — Z9981 Dependence on supplemental oxygen: Secondary | ICD-10-CM | POA: Insufficient documentation

## 2014-07-01 DIAGNOSIS — Z87798 Personal history of other (corrected) congenital malformations: Secondary | ICD-10-CM | POA: Diagnosis not present

## 2014-07-01 DIAGNOSIS — Y9389 Activity, other specified: Secondary | ICD-10-CM | POA: Insufficient documentation

## 2014-07-01 DIAGNOSIS — W208XXA Other cause of strike by thrown, projected or falling object, initial encounter: Secondary | ICD-10-CM | POA: Diagnosis not present

## 2014-07-01 DIAGNOSIS — S92314B Nondisplaced fracture of first metatarsal bone, right foot, initial encounter for open fracture: Secondary | ICD-10-CM | POA: Insufficient documentation

## 2014-07-01 DIAGNOSIS — G40909 Epilepsy, unspecified, not intractable, without status epilepticus: Secondary | ICD-10-CM | POA: Insufficient documentation

## 2014-07-01 DIAGNOSIS — S92301B Fracture of unspecified metatarsal bone(s), right foot, initial encounter for open fracture: Secondary | ICD-10-CM

## 2014-07-01 DIAGNOSIS — Z79899 Other long term (current) drug therapy: Secondary | ICD-10-CM | POA: Insufficient documentation

## 2014-07-01 DIAGNOSIS — Z23 Encounter for immunization: Secondary | ICD-10-CM | POA: Diagnosis not present

## 2014-07-01 DIAGNOSIS — Y998 Other external cause status: Secondary | ICD-10-CM | POA: Insufficient documentation

## 2014-07-01 HISTORY — DX: Unspecified nystagmus: H55.00

## 2014-07-01 HISTORY — PX: IRRIGATION AND DEBRIDEMENT: PRO59

## 2014-07-01 LAB — COMPREHENSIVE METABOLIC PANEL
ALK PHOS: 62 U/L (ref 39–117)
ALT: 12 U/L (ref 0–53)
AST: 22 U/L (ref 0–37)
Albumin: 3.8 g/dL (ref 3.5–5.2)
Anion gap: 8 (ref 5–15)
BILIRUBIN TOTAL: 0.6 mg/dL (ref 0.3–1.2)
BUN: 17 mg/dL (ref 6–23)
CALCIUM: 9.1 mg/dL (ref 8.4–10.5)
CO2: 29 mmol/L (ref 19–32)
CREATININE: 0.99 mg/dL (ref 0.50–1.35)
Chloride: 98 mmol/L (ref 96–112)
GFR calc Af Amer: >90 mL/min (ref >90)
GFR calc non Af Amer: >90 mL/min (ref >90)
GLUCOSE: 111 mg/dL — AB (ref 70–99)
Potassium: 4.2 mmol/L (ref 3.5–5.1)
SODIUM: 135 mmol/L (ref 135–145)
Total Protein: 7.8 g/dL (ref 6.0–8.3)

## 2014-07-01 LAB — CBC WITH DIFFERENTIAL/PLATELET
BASOS ABS: 0 10*3/uL (ref 0.0–0.1)
Basophils Relative: 0 % (ref 0–1)
EOS PCT: 0 % (ref 0–5)
Eosinophils Absolute: 0 10*3/uL (ref 0.0–0.7)
HEMATOCRIT: 47.8 % (ref 39.0–52.0)
Hemoglobin: 16.2 g/dL (ref 13.0–17.0)
LYMPHS ABS: 1.3 10*3/uL (ref 0.7–4.0)
Lymphocytes Relative: 17 % (ref 12–46)
MCH: 33.9 pg (ref 26.0–34.0)
MCHC: 33.9 g/dL (ref 30.0–36.0)
MCV: 100 fL (ref 78.0–100.0)
Monocytes Absolute: 0.6 10*3/uL (ref 0.1–1.0)
Monocytes Relative: 8 % (ref 3–12)
NEUTROS ABS: 5.9 10*3/uL (ref 1.7–7.7)
NEUTROS PCT: 75 % (ref 43–77)
Platelets: 225 10*3/uL (ref 150–400)
RBC: 4.78 MIL/uL (ref 4.22–5.81)
RDW: 14.1 % (ref 11.5–15.5)
WBC: 7.8 10*3/uL (ref 4.0–10.5)

## 2014-07-01 LAB — PROTIME-INR
INR: 0.96 (ref 0.00–1.49)
PROTHROMBIN TIME: 12.9 s (ref 11.6–15.2)

## 2014-07-01 LAB — TYPE AND SCREEN
ABO/RH(D): A POS
Antibody Screen: NEGATIVE

## 2014-07-01 MED ORDER — LIDOCAINE HCL 1 % IJ SOLN
INTRAMUSCULAR | Status: AC
  Filled 2014-07-01: qty 20

## 2014-07-01 MED ORDER — TETANUS-DIPHTH-ACELL PERTUSSIS 5-2.5-18.5 LF-MCG/0.5 IM SUSP
0.5000 mL | Freq: Once | INTRAMUSCULAR | Status: AC
  Administered 2014-07-01: 0.5 mL via INTRAMUSCULAR
  Filled 2014-07-01: qty 0.5

## 2014-07-01 MED ORDER — VANCOMYCIN HCL IN DEXTROSE 1-5 GM/200ML-% IV SOLN
1000.0000 mg | Freq: Once | INTRAVENOUS | Status: AC
  Administered 2014-07-01: 1000 mg via INTRAVENOUS
  Filled 2014-07-01: qty 200

## 2014-07-01 MED ORDER — HYDROCODONE-ACETAMINOPHEN 5-325 MG PO TABS: 1.0000 | ORAL_TABLET | Freq: Four times a day (QID) | ORAL | Status: DC | PRN

## 2014-07-01 MED ORDER — CEPHALEXIN 500 MG PO CAPS
1000.0000 mg | ORAL_CAPSULE | Freq: Once | ORAL | Status: AC
  Administered 2014-07-01: 1000 mg via ORAL
  Filled 2014-07-01: qty 2

## 2014-07-01 MED ORDER — LIDOCAINE HCL 2 % IJ SOLN
5.0000 mL | Freq: Once | INTRAMUSCULAR | Status: AC
  Administered 2014-07-01: 100 mg
  Filled 2014-07-01: qty 20

## 2014-07-01 MED ORDER — DOXYCYCLINE HYCLATE 50 MG PO CAPS: 50.0000 mg | ORAL_CAPSULE | Freq: Two times a day (BID) | ORAL | Status: DC

## 2014-07-01 MED ORDER — CEFAZOLIN SODIUM 1-5 GM-% IV SOLN
1.0000 g | Freq: Once | INTRAVENOUS | Status: AC
  Administered 2014-07-01: 1 g via INTRAVENOUS
  Filled 2014-07-01: qty 50

## 2014-07-01 NOTE — ED Notes (Signed)
Bed: [id number] Expected date:  Expected time:  Means of arrival:  Comments: EMS-foot lac-thinners

## 2014-07-01 NOTE — ED Provider Notes (Addendum)
CSN: [id number]     Arrival date & time 07/01/14  0803 History   First MD Initiated Contact with Patient 07/01/14 [phone]     Chief Complaint  Patient presents with  . Foot Injury     (Consider location/radiation/quality/duration/timing/severity/associated sxs/prior Treatment) The history is provided by the patient.  [name] is a 49 y.o. male hx of down's syndrome, seizures from a group home here with R foot injury. He got agitated today and pushed a drawer and it fell on his right foot. He has a laceration that was bleeding. Denies other injuries. Patient unable to give history due to down's syndrome. Unknown tetanus.    Level V caveat- down's syndrome   Past Medical History  Diagnosis Date  . Down's syndrome   . Hypothyroid   . Lives in group home     [phone]-[phone]-Roxanne-nurse  . Obstructive sleep apnea (adult) (pediatric)     does use a cpap at night  . Seizures   . Nystagmus    Past Surgical History  Procedure Laterality Date  . Right elbow surgery    . Orif humerus fracture Left 02/05/2013    Procedure: OPEN REDUCTION INTERNAL FIXATION (ORIF) MEDIAL HUMERUS CONDYLE FRACTURE;  Surgeon: Jodi Marble, MD;  Location: [facility];  Service: Orthopedics;  Laterality: Left;   History reviewed. No pertinent family history. History  Substance Use Topics  . Smoking status: Never Smoker   . Smokeless tobacco: Not on file  . Alcohol Use: No    Review of Systems  Skin: Positive for wound.  All other systems reviewed and are negative.     Allergies  Review of patient's allergies indicates no known allergies.  Home Medications   Prior to Admission medications   Medication Sig Start Date End Date Taking? Authorizing Provider  aspirin 81 MG tablet Take 81 mg by mouth daily.   Yes Historical Provider, MD  Calcium Carb-Cholecalciferol (CALCIUM-VITAMIN D) 500-400 MG-UNIT TABS Take 1 tablet by mouth 2 (two) times daily.    Yes Historical Provider, MD  divalproex (DEPAKOTE) 250 MG DR tablet Take 500-750 mg by mouth 2 (two) times daily. Takes two tablets ( ) in the am and three tablets ( ) in the evening. Do not crush   Yes Historical Provider, MD  famotidine (PEPCID) 20 MG tablet Take 20 mg by mouth at bedtime.    Yes Historical Provider, MD  Infant Care Products (JOHNSONS BABY SHAMPOO) SHAM 1/2 H2O and 1/2 shampoo-scrub eyebrows qd   Yes Historical Provider, MD  levothyroxine (SYNTHROID, LEVOTHROID) 100 MCG tablet Take 100 mcg by mouth daily before breakfast.   Yes Historical Provider, MD  tolnaftate (TINACTIN) 1 % powder Apply between toes and both feet nightly   Yes Historical Provider, MD  Vitamins A & D (VITAMIN A & D) ointment Apply to both cheeks every morning and each night prior to placing CPAP mask on   Yes Historical Provider, MD  acetaminophen (TYLENOL) 325 MG tablet Take 650 mg by mouth every 4 (four) hours as needed.    Historical Provider, MD   BP 134/86 mmHg  Pulse 65  Temp(Src) 98.6 F (37 C) (Oral)  Resp 16  SpO2 95% Physical Exam  Constitutional: He is oriented to person, place, and time. He appears well-nourished.  Down syndrome   HENT:  Head: Normocephalic and atraumatic.  Mouth/Throat: Oropharynx is clear and moist.  Eyes: Conjunctivae are normal. Pupils are equal, round, and reactive to light.  Neck: Normal range of  motion. Neck supple.  Cardiovascular: Normal rate, regular rhythm and normal heart sounds.   Pulmonary/Chest: Effort normal and breath sounds normal. No respiratory distress. He has no wheezes. He has no rales.  Abdominal: Soft. Bowel sounds are normal. He exhibits no distension. There is no tenderness. There is no rebound and no guarding.  Musculoskeletal:  R foot with 2 small laceration dorsal aspects with minimal bleeding. Some swelling around it. 2+ pulses, able to wiggle toes   Neurological: He is alert and oriented to person, place, and time. No cranial nerve  deficit. Coordination normal.  Skin: Skin is warm and dry.  Psychiatric:  Unable   Nursing note and vitals reviewed.   ED Course  Procedures (including critical care time) LACERATION REPAIR Performed by: Chaney MallingYAO, Aslan Montagna Authorized by: Chaney MallingYAO, Ramah Langhans Consent: Verbal consent obtained. Risks and benefits: risks, benefits and alternatives were discussed Consent given by: patient Patient identity confirmed: provided demographic data Prepped and Draped in normal sterile fashion Wound explored  Laceration Location: R foot  Laceration Length:  2 cm  No Foreign Bodies seen or palpated  Anesthesia: local infiltration  Local anesthetic: lidocaine 2 % no epinephrine  Anesthetic total: 3 ml  Irrigation method: syringe Amount of cleaning: standard  Skin closure: 4-0 ethilon  Number of sutures: 3  Technique: simple interrupted.   Patient tolerance: Patient tolerated the procedure well with no immediate complications.   Labs Review Labs Reviewed  COMPREHENSIVE METABOLIC PANEL - Abnormal; Notable for the following:    Glucose, Bld 111 (*)    All other components within normal limits  CBC WITH DIFFERENTIAL/PLATELET  PROTIME-INR  TYPE AND SCREEN    Imaging Review Dg Foot Complete Right  07/01/2014   CLINICAL DATA:  Crush injury right foot this morning. Right foot pain and swelling. Puncture wounds. Initial encounter.  EXAM: RIGHT FOOT COMPLETE - 3+ VIEW  COMPARISON:  Plain films right foot 11/11/2010.  FINDINGS: The patient has a nondisplaced mid-diaphyseal fractures of the first and second metatarsals. Remote healed fracture of the distal fifth metatarsal is identified. Midfoot degenerative change is seen. Laceration over the dorsum of the foot is identified.  IMPRESSION: Nondisplaced midshaft fractures first and second metatarsals with associated laceration.   Electronically Signed   By: Drusilla Kannerhomas  Dalessio M.D.   On: 07/01/2014 08:40     EKG Interpretation None      MDM   Final  diagnoses:  None    [name] is a 49 y.o. male here with R foot laceration s/p injury. Will get xray and suture laceration. Will update tetanus   9 AM  Xray showed nondisplaced midshaft fracture first and second metatarsals. Laceration sutured. Bleeding stopped. Give keflex for prophylaxis. Consulted ortho, Dr. Shon BatonBrooks.   11 AM Dr. Shon BatonBrooks called back. Concerned for possible open fracture. Given ancef. He recommend vanc for possible MRSA. He will see patient in the ED.   1:40 PM  Dr. Shon BatonBrooks in a surgical case. Will be by later. He offered irrigation in the ED and f/u tomorrow. However staff said that they are unable to bring him over tomorrow. Dr. Shon BatonBrooks will see patient in the ED in several hours.   3:22 PM Dr. Shon BatonBrooks present. Will irrigate wound and wants patient to dc on doxy. F/u in 5 days with him in the office.   Richardean Canalavid H Amarii Amy, MD 07/01/14 [id number]  Richardean Canalavid H Honora Searson, MD 07/01/14 [phone]

## 2014-07-01 NOTE — ED Notes (Signed)
Group home staff want to wait for orthopedics. Pt and staff member informed of delay.

## 2014-07-01 NOTE — Procedures (Signed)
Foot cleansed with betadine Removed sutures  4 cc 1% lidocaine used for local anesthesia Irrigated with approximately 2 L of LR Wound explored - does not probe to bone - superficial laceration Horizontal mattress stitch used to close both laceration - 3-0 ethibond Bulky dressing applied No complications

## 2014-07-01 NOTE — ED Notes (Addendum)
[facility] hx of downs syndrome. Staff report pt became agitated this am and pushed over chest of drawers onto R foot. EMS reports 2 1" lacerations to top of foot. Bleeding controled with tight dressing. Bleeding returns with removal of gauze. No deformity noted. Pt on aspirin

## 2014-07-01 NOTE — Discharge Instructions (Signed)
Follow up with Dr. Shon BatonBrooks on Friday.   Take doxycycline twice daily for a week.   Use cam walker. Non ambulatory.   Return to ER if you have fever, worse redness and swelling, purulent discharge from wound.   CAM boot at all times - Ambulate with assistance is ok but prefer NWB right lower extremity   Pain medications per ER staff  Return to ER for excessive swelling, pain, or numbness.    Cast or Splint Care Casts and splints support injured limbs and keep bones from moving while they heal.  HOME CARE  Keep the cast or splint uncovered during the drying period.  A plaster cast can take 24 to 48 hours to dry.  A fiberglass cast will dry in less than 1 hour.  Do not rest the cast on anything harder than a pillow for 24 hours.  Do not put weight on your injured limb. Do not put pressure on the cast. Wait for your doctor's approval.  Keep the cast or splint dry.  Cover the cast or splint with a plastic bag during baths or wet weather.  If you have a cast over your chest and belly (trunk), take sponge baths until the cast is taken off.  If your cast gets wet, dry it with a towel or blow dryer. Use the cool setting on the blow dryer.  Keep your cast or splint clean. Wash a dirty cast with a damp cloth.  Do not put any objects under your cast or splint.  Do not scratch the skin under the cast with an object. If itching is a problem, use a blow dryer on a cool setting over the itchy area.  Do not trim or cut your cast.  Do not take out the padding from inside your cast.  Exercise your joints near the cast as told by your doctor.  Raise (elevate) your injured limb on 1 or 2 pillows for the first 1 to 3 days. GET HELP IF:  Your cast or splint cracks.  Your cast or splint is too tight or too loose.  You itch badly under the cast.  Your cast gets wet or has a soft spot.  You have a bad smell coming from the cast.  You get an object stuck under the cast.  Your  skin around the cast becomes red or sore.  You have new or more pain after the cast is put on. GET HELP RIGHT AWAY IF:  You have fluid leaking through the cast.  You cannot move your fingers or toes.  Your fingers or toes turn blue or white or are cool, painful, or puffy (swollen).  You have tingling or lose feeling (numbness) around the injured area.  You have bad pain or pressure under the cast.  You have trouble breathing or have shortness of breath.  You have chest pain. Document Released: 09/16/2010 Document Revised: 01/17/2013 Document Reviewed: 11/23/2012 [facility] Patient Information 2015 [employer] [place]. This information is not intended to replace advice given to you by your health care provider. Make sure you discuss any questions you have with your health care provider.

## 2014-07-01 NOTE — Consult Note (Signed)
Pcp Not In System Chief Complaint: Right foot injury History: [name] is a 49 y.o. male hx of down's syndrome, seizures from a group home here with R foot injury. He got agitated today and pushed a drawer and it fell on his right foot. He has a laceration that was bleeding. Denies other injuries. Patient unable to give history due to down's syndrome. Unknown tetanus.  Past Medical History  Diagnosis Date  . Down's syndrome   . Hypothyroid   . Lives in group home     [facility]-[phone]-fax-[phone]-Roxanne-nurse  . Obstructive sleep apnea (adult) (pediatric)     does use a cpap at night  . Seizures   . Nystagmus     No Known Allergies  No current facility-administered medications on file prior to encounter.   Current Outpatient Prescriptions on File Prior to Encounter  Medication Sig Dispense Refill  . aspirin 81 MG tablet Take 81 mg by mouth daily.    . Calcium Carb-Cholecalciferol (CALCIUM-VITAMIN D) 500-400 MG-UNIT TABS Take 1 tablet by mouth 2 (two) times daily.    . famotidine (PEPCID) 20 MG tablet Take 20 mg by mouth at bedtime.     Thornell Sartorius. Infant Care Products (JOHNSONS BABY SHAMPOO) SHAM 1/2 H2O and 1/2 shampoo-scrub eyebrows qd    . tolnaftate (TINACTIN) 1 % powder Apply between toes and both feet nightly    . Vitamins A & D (VITAMIN A & D) ointment Apply to both cheeks every morning and each night prior to placing CPAP mask on    . acetaminophen (TYLENOL) 325 MG tablet Take 650 mg by mouth every 4 (four) hours as needed.      Physical Exam: Filed Vitals:   07/01/14 1439  BP: 134/86  Pulse: 65  Temp: 98.6 F (37 C)  Resp: 16   Compartments soft/nt Moving all digits 2+ DP/PT pulses 2 small lacerations noted over dorsum of the foot.  No ankle/knee pain No soft/cp abd soft/nt Image: Dg Foot Complete Right  07/01/2014   CLINICAL DATA:  Crush injury right foot this morning. Right foot pain and swelling. Puncture wounds. Initial encounter.  EXAM:  RIGHT FOOT COMPLETE - 3+ VIEW  COMPARISON:  Plain films right foot 11/11/2010.  FINDINGS: The patient has a nondisplaced mid-diaphyseal fractures of the first and second metatarsals. Remote healed fracture of the distal fifth metatarsal is identified. Midfoot degenerative change is seen. Laceration over the dorsum of the foot is identified.  IMPRESSION: Nondisplaced midshaft fractures first and second metatarsals with associated laceration.   Electronically Signed   By: Drusilla Kannerhomas  Dalessio M.D.   On: 07/01/2014 08:40    A/P:  Patient with 1st and 2nd MT fx's on the right foot.  2 small lacerations noted over dorsum of the foot.  No exposed bone, wound does not palpate to bone.   Plan:  Irrigation of wound with 2L of LR.  Closed with single stitch. No significant swelling - neuro grossly intact.   Plan on CAM boot and restricted ambulation.  Explained to representative of group home that it would be best to be MWB - but if he does walk he must have CAM boot. Will f/u this Friday for wound check

## 2014-07-09 ENCOUNTER — Ambulatory Visit: Payer: Medicare Other | Admitting: Internal Medicine

## 2014-08-06 ENCOUNTER — Encounter (HOSPITAL_BASED_OUTPATIENT_CLINIC_OR_DEPARTMENT_OTHER): Payer: Medicare Other | Attending: General Surgery

## 2014-08-06 DIAGNOSIS — Q909 Down syndrome, unspecified: Secondary | ICD-10-CM | POA: Diagnosis not present

## 2014-08-06 DIAGNOSIS — S91301A Unspecified open wound, right foot, initial encounter: Secondary | ICD-10-CM | POA: Insufficient documentation

## 2014-08-13 DIAGNOSIS — Q909 Down syndrome, unspecified: Secondary | ICD-10-CM | POA: Diagnosis not present

## 2014-08-13 DIAGNOSIS — S91301A Unspecified open wound, right foot, initial encounter: Secondary | ICD-10-CM | POA: Diagnosis not present

## 2014-08-22 ENCOUNTER — Ambulatory Visit (INDEPENDENT_AMBULATORY_CARE_PROVIDER_SITE_OTHER): Payer: Medicare Other | Admitting: Internal Medicine

## 2014-08-22 ENCOUNTER — Encounter: Payer: Self-pay | Admitting: Internal Medicine

## 2014-08-22 VITALS — BP 120/74 | HR 92

## 2014-08-22 DIAGNOSIS — Q909 Down syndrome, unspecified: Secondary | ICD-10-CM

## 2014-08-22 DIAGNOSIS — G4733 Obstructive sleep apnea (adult) (pediatric): Secondary | ICD-10-CM

## 2014-08-22 NOTE — Progress Notes (Signed)
07/07/11- 45 yoM Down's Syndrome, followed for OSA LOV-07/06/10 A staff member from his nursing home is here with him. She reports that he will pull mask off during the night and staff promptly replaces it. He then sleeps the rest of the night with no problem wearing a mask essentially all night every night. CPAP 10/[facility]. She denies that he has significant daytime sleepiness or breakthrough snoring.  07/06/12-46 yoM Down's Syndrome, followed for OSA FOLLOWS FOR: wears CPAP 10/ Advanced every night-staff keeps check on patient to make sure mask stays on him The aide today is the same who was here last year. She says he is no longer drowsy in the day since using CPAP consistently, but now he has facial rash consistent with shape of the mask.  07/06/13- 47 yoM Down's Syndrome, followed for OSA    Aide here FOLLOWS FOR: wears CPAP 10/ Advanced every night-does take the mask off sometimes in the night; staff keeps an eye on him to make sure he is wearing the mask though. The staff do notice that if he pulls his mask off during the night, he may be sleepier the next day. He usually keeps it on with no problem. It prevents snoring. They have no concerns. The mask has caused some skin irritation at times, controlled with "A&D ointment"  08/22/14- 47 yoM Down's Syndrome, followed for OSA    Aide here  CPAP 10/ Advanced every night managed by staff at group home The aide for a lot of detail but indicates no reported problems with continued use of CPAP every night. Ephriam KnucklesChristian is nonverbal. A medical record note book is reviewed and returned.  ROS-see HPI- as reported by staff assistant with him Constitutional:   No-   weight loss, night sweats, fevers, chills, fatigue, lassitude. HEENT:   No-  headaches, difficulty swallowing, tooth/dental problems, sore throat,       No-  sneezing, itching, ear ache, nasal congestion, post nasal drip,  CV:  No-   chest pain, orthopnea, PND, swelling in lower extremities,  anasarca, dizziness, palpitations Resp: No-   shortness of breath with exertion or at rest.              No-   productive cough,  No non-productive cough,  No- coughing up of blood.              No-   change in color of mucus.  No- wheezing.   Skin: No-   rash or lesions. GI:  No-   heartburn, indigestion, abdominal pain, nausea, vomiting,  GU: No-   . MS:  No-   joint pain or swelling.  Neuro-     nothing unusual Psych:  No- change in mood or affect. No depression or anxiety.  No memory loss.  OBJ- Physical Exam General- Alert, Aware, Affect-pleasant, Distress- none acute, non-sensical. Overweight Skin- Rash c/w pressure from a full face CPAP mask. No breakdown or strap pressure marks. Lymphadenopathy- none Head- atraumatic            Eyes- Gross vision intact, PERRLA, conjunctivae and secretions clear            Ears- Hearing, grossly normal            Nose- Clear, no-Septal dev, mucus, polyps, erosion, perforation             Throat- Mallampati III-IV , mucosa clear , drainage- none, tonsils- atrophic Neck- flexible , trachea midline, no stridor , thyroid nl, carotid no bruit  Chest - symmetrical excursion , unlabored           Heart/CV- RRR , no murmur , no gallop  , no rub, nl s1 s2                           - JVD- none , edema- none, stasis changes- none, varices- none           Lung- clear to P&A, wheeze- none, cough- none , dullness-none, rub- none           Chest wall-  Abd-  Br/ Gen/ Rectal- Not done, not indicated Extrem- +R foot in boot Neuro- +severely retarded, nystagmus, gesturing w/ left hand. Few grunts.

## 2014-08-22 NOTE — Patient Instructions (Signed)
Order- [facility]- download CPAP for pressure compliance      Dx OSA  Please call as needed 

## 2014-08-25 NOTE — Assessment & Plan Note (Signed)
The pressure and mask fit seem appropriate with no reported concerns. Plan-seek download from DME company

## 2014-08-25 NOTE — Assessment & Plan Note (Signed)
Cared for at group home. He looks physically well cared for.

## 2014-09-03 ENCOUNTER — Encounter (HOSPITAL_BASED_OUTPATIENT_CLINIC_OR_DEPARTMENT_OTHER): Payer: Medicare Other | Attending: General Surgery

## 2014-09-03 DIAGNOSIS — Q909 Down syndrome, unspecified: Secondary | ICD-10-CM | POA: Diagnosis not present

## 2014-09-03 DIAGNOSIS — X58XXXA Exposure to other specified factors, initial encounter: Secondary | ICD-10-CM | POA: Insufficient documentation

## 2014-09-03 DIAGNOSIS — S91301A Unspecified open wound, right foot, initial encounter: Secondary | ICD-10-CM | POA: Insufficient documentation

## 2014-09-03 DIAGNOSIS — L97511 Non-pressure chronic ulcer of other part of right foot limited to breakdown of skin: Secondary | ICD-10-CM | POA: Diagnosis not present

## 2014-09-17 DIAGNOSIS — L97511 Non-pressure chronic ulcer of other part of right foot limited to breakdown of skin: Secondary | ICD-10-CM | POA: Diagnosis not present

## 2014-09-17 DIAGNOSIS — S91301A Unspecified open wound, right foot, initial encounter: Secondary | ICD-10-CM | POA: Diagnosis not present

## 2014-09-17 DIAGNOSIS — Q909 Down syndrome, unspecified: Secondary | ICD-10-CM | POA: Diagnosis not present

## 2014-10-01 ENCOUNTER — Encounter (HOSPITAL_BASED_OUTPATIENT_CLINIC_OR_DEPARTMENT_OTHER): Payer: Medicare Other | Attending: General Surgery

## 2014-10-01 DIAGNOSIS — Q909 Down syndrome, unspecified: Secondary | ICD-10-CM | POA: Insufficient documentation

## 2014-10-01 DIAGNOSIS — S91301D Unspecified open wound, right foot, subsequent encounter: Secondary | ICD-10-CM | POA: Insufficient documentation

## 2014-10-01 DIAGNOSIS — Z8781 Personal history of (healed) traumatic fracture: Secondary | ICD-10-CM | POA: Insufficient documentation

## 2014-10-01 DIAGNOSIS — L97511 Non-pressure chronic ulcer of other part of right foot limited to breakdown of skin: Secondary | ICD-10-CM | POA: Insufficient documentation

## 2014-10-22 DIAGNOSIS — L97511 Non-pressure chronic ulcer of other part of right foot limited to breakdown of skin: Secondary | ICD-10-CM | POA: Diagnosis not present

## 2014-10-22 DIAGNOSIS — Q909 Down syndrome, unspecified: Secondary | ICD-10-CM | POA: Diagnosis not present

## 2014-10-22 DIAGNOSIS — Z8781 Personal history of (healed) traumatic fracture: Secondary | ICD-10-CM | POA: Diagnosis not present

## 2014-10-22 DIAGNOSIS — S91301D Unspecified open wound, right foot, subsequent encounter: Secondary | ICD-10-CM | POA: Diagnosis not present

## 2014-11-12 ENCOUNTER — Encounter (HOSPITAL_BASED_OUTPATIENT_CLINIC_OR_DEPARTMENT_OTHER): Payer: Medicare Other | Attending: General Surgery

## 2015-03-15 ENCOUNTER — Emergency Department (HOSPITAL_COMMUNITY): Payer: Medicare Other

## 2015-03-15 ENCOUNTER — Emergency Department (HOSPITAL_COMMUNITY)
Admission: EM | Admit: 2015-03-15 | Discharge: 2015-03-15 | Disposition: A | Discharge status: Home / Self Care | Payer: Medicare Other | Attending: Emergency Medicine | Admitting: Emergency Medicine

## 2015-03-15 ENCOUNTER — Encounter (HOSPITAL_COMMUNITY): Payer: Self-pay | Admitting: Emergency Medicine

## 2015-03-15 DIAGNOSIS — Y9289 Other specified places as the place of occurrence of the external cause: Secondary | ICD-10-CM | POA: Diagnosis not present

## 2015-03-15 DIAGNOSIS — Z9981 Dependence on supplemental oxygen: Secondary | ICD-10-CM | POA: Insufficient documentation

## 2015-03-15 DIAGNOSIS — Y9389 Activity, other specified: Secondary | ICD-10-CM | POA: Diagnosis not present

## 2015-03-15 DIAGNOSIS — X58XXXA Exposure to other specified factors, initial encounter: Secondary | ICD-10-CM | POA: Diagnosis not present

## 2015-03-15 DIAGNOSIS — Z7982 Long term (current) use of aspirin: Secondary | ICD-10-CM | POA: Diagnosis not present

## 2015-03-15 DIAGNOSIS — Z79899 Other long term (current) drug therapy: Secondary | ICD-10-CM | POA: Diagnosis not present

## 2015-03-15 DIAGNOSIS — Z792 Long term (current) use of antibiotics: Secondary | ICD-10-CM | POA: Diagnosis not present

## 2015-03-15 DIAGNOSIS — G4733 Obstructive sleep apnea (adult) (pediatric): Secondary | ICD-10-CM | POA: Insufficient documentation

## 2015-03-15 DIAGNOSIS — G40909 Epilepsy, unspecified, not intractable, without status epilepticus: Secondary | ICD-10-CM | POA: Diagnosis not present

## 2015-03-15 DIAGNOSIS — Z87720 Personal history of (corrected) congenital malformations of eye: Secondary | ICD-10-CM | POA: Insufficient documentation

## 2015-03-15 DIAGNOSIS — E039 Hypothyroidism, unspecified: Secondary | ICD-10-CM | POA: Diagnosis not present

## 2015-03-15 DIAGNOSIS — Q909 Down syndrome, unspecified: Secondary | ICD-10-CM | POA: Insufficient documentation

## 2015-03-15 DIAGNOSIS — S9032XA Contusion of left foot, initial encounter: Secondary | ICD-10-CM | POA: Diagnosis not present

## 2015-03-15 DIAGNOSIS — Y998 Other external cause status: Secondary | ICD-10-CM | POA: Diagnosis not present

## 2015-03-15 DIAGNOSIS — S90122A Contusion of left lesser toe(s) without damage to nail, initial encounter: Secondary | ICD-10-CM

## 2015-03-15 DIAGNOSIS — T1490XA Injury, unspecified, initial encounter: Secondary | ICD-10-CM

## 2015-03-15 DIAGNOSIS — S99922A Unspecified injury of left foot, initial encounter: Secondary | ICD-10-CM | POA: Diagnosis present

## 2015-03-15 NOTE — Discharge Instructions (Signed)
Tylenol and Motrin for any pain.  Return here as needed.  Ice as needed

## 2015-03-15 NOTE — ED Provider Notes (Signed)
CSN: [id number]     Arrival date & time 03/15/15  1910 History   First MD Initiated Contact with Patient 03/15/15 1955     Chief Complaint  Patient presents with  . Toe Pain     (Consider location/radiation/quality/duration/timing/severity/associated sxs/prior Treatment) HPI Patient presents to the emergency department with bruising to the left second, third and fourth toes.  The group home staff member says that they will know what the bruising is from.  They just noticed it this evening when they are getting ready to give him a bath.  The patient is noncommunicative due to mental retardation.  The patient had no known injury Past Medical History  Diagnosis Date  . Down's syndrome   . Hypothyroid   . Lives in group home     [facility]-[phone]-fax-[phone]-Roxanne-nurse  . Obstructive sleep apnea (adult) (pediatric)     does use a cpap at night  . Seizures (HCC)   . Nystagmus    Past Surgical History  Procedure Laterality Date  . Right elbow surgery    . Orif humerus fracture Left 02/05/2013    Procedure: OPEN REDUCTION INTERNAL FIXATION (ORIF) MEDIAL HUMERUS CONDYLE FRACTURE;  Surgeon: Jodi Marbleavid A Thompson, MD;  Location: [facility];  Service: Orthopedics;  Laterality: Left;  . Irrigation and debridement  07/01/2014        No family history on file. Social History  Substance Use Topics  . Smoking status: Never Smoker   . Smokeless tobacco: None  . Alcohol Use: No    Review of Systems Level V caveat applies due to mental retardation   Allergies  Review of patient's allergies indicates no known allergies.  Home Medications   Prior to Admission medications   Medication Sig Start Date End Date Taking? Authorizing Provider  acetaminophen (TYLENOL) 325 MG tablet Take 650 mg by mouth every 4 (four) hours as needed.    Historical Provider, MD  aspirin 81 MG tablet Take 81 mg by mouth daily.    Historical Provider, MD  Calcium Carb-Cholecalciferol  (CALCIUM-VITAMIN D) 500-400 MG-UNIT TABS Take 1 tablet by mouth 2 (two) times daily.    Historical Provider, MD  divalproex (DEPAKOTE) 250 MG DR tablet Take 500-750 mg by mouth 2 (two) times daily. Takes two tablets (500mg ) in the am and three tablets (750mg ) in the evening. Do not crush    Historical Provider, MD  doxycycline (VIBRAMYCIN) 50 MG capsule Take 1 capsule (50 mg total) by mouth 2 (two) times daily. 07/01/14   Richardean Canalavid H Yao, MD  famotidine (PEPCID) 20 MG tablet Take 20 mg by mouth at bedtime.     Historical Provider, MD  HYDROcodone-acetaminophen (NORCO/VICODIN) 5-325 MG per tablet Take 1 tablet by mouth every 6 (six) hours as needed for moderate pain or severe pain. 07/01/14   Richardean Canalavid H Yao, MD  Infant Care Products (JOHNSONS BABY SHAMPOO) SHAM 1/2 H2O and 1/2 shampoo-scrub eyebrows qd    Historical Provider, MD  levothyroxine (SYNTHROID, LEVOTHROID) 100 MCG tablet Take 100 mcg by mouth daily before breakfast.    Historical Provider, MD  tolnaftate (TINACTIN) 1 % powder Apply between toes and both feet nightly    Historical Provider, MD  Vitamins A & D (VITAMIN A & D) ointment Apply to both cheeks every morning and each night prior to placing CPAP mask on    Historical Provider, MD   BP 158/91 mmHg  Pulse 101  Temp(Src) 98.3 F (36.8 C) (Oral)  Resp 17  SpO2 100% Physical  Exam  HENT:  Head: Normocephalic and atraumatic.  Pulmonary/Chest: Effort normal.  Musculoskeletal:       Feet:  Skin: Skin is warm and dry.    ED Course  Procedures (including critical care time) Labs Review Labs Reviewed - No data to display  Imaging Review Dg Foot Complete Left  03/15/2015  CLINICAL DATA:  Bruising to the second, third and fourth toes on the left foot. No reported injury. EXAM: LEFT FOOT - COMPLETE 3+ VIEW COMPARISON:  03/12/2012 FINDINGS: No fracture. Joints are normally aligned with no significant arthropathic change. Soft tissues are unremarkable. IMPRESSION: Negative. Electronically  Signed   By: Amie Portland M.D.   On: 03/15/2015 19:47   I have personally reviewed and evaluated these images and lab results as part of my medical decision-making. The group home worker is advised to return here as needed.  Tylenol and Motrin for any pain.  Ice and elevate x-rays did not show any abnormality  Charlestine Night, PA-C 03/15/15 2004  Cathren Laine, MD 03/15/15 2213

## 2015-03-15 NOTE — ED Notes (Signed)
Bed: WTR8 Expected date: 03/15/15 Expected time: 7:07 PM Means of arrival: Ambulance Comments: From group home, possible broken toe

## 2015-03-15 NOTE — ED Notes (Signed)
Pt is non-verbal. Arrives from a group home. Pt has bruising to 2nd, 3rd, 4th toes on L foot. Peripheral pulses intact. No swelling. Onycymycosis noted to nail beds of feet.

## 2015-03-15 NOTE — ED Notes (Signed)
Group home member at bedside.

## 2015-04-07 ENCOUNTER — Encounter: Payer: Self-pay | Admitting: Internal Medicine

## 2015-08-22 ENCOUNTER — Ambulatory Visit: Payer: Medicare Other | Admitting: Internal Medicine

## 2015-09-15 ENCOUNTER — Ambulatory Visit (INDEPENDENT_AMBULATORY_CARE_PROVIDER_SITE_OTHER): Payer: Medicare Other | Admitting: Internal Medicine

## 2015-09-15 ENCOUNTER — Encounter: Payer: Self-pay | Admitting: Internal Medicine

## 2015-09-15 VITALS — BP 118/72 | HR 83 | Ht 63.0 in | Wt 197.0 lb

## 2015-09-15 DIAGNOSIS — Q909 Down syndrome, unspecified: Secondary | ICD-10-CM

## 2015-09-15 DIAGNOSIS — G4733 Obstructive sleep apnea (adult) (pediatric): Secondary | ICD-10-CM

## 2015-09-15 NOTE — Progress Notes (Signed)
07/07/11- 45 yoM Down's Syndrome, followed for OSA LOV-07/06/10 A staff member from his nursing home is here with him. She reports that he will pull mask off during the night and staff promptly replaces it. He then sleeps the rest of the night with no problem wearing a mask essentially all night every night. CPAP 10/Advanced. She denies that he has significant daytime sleepiness or breakthrough snoring.  07/06/12-46 yoM Down's Syndrome, followed for OSA FOLLOWS FOR: wears CPAP 10/ Advanced every night-staff keeps check on patient to make sure mask stays on him The aide today is the same who was here last year. She says he is no longer drowsy in the day since using CPAP consistently, but now he has facial rash consistent with shape of the mask.  07/06/13- 47 yoM Down's Syndrome, followed for OSA    Aide here FOLLOWS FOR: wears CPAP 10/ Advanced every night-does take the mask off sometimes in the night; staff keeps an eye on him to make sure he is wearing the mask though. The staff do notice that if he pulls his mask off during the night, he may be sleepier the next day. He usually keeps it on with no problem. It prevents snoring. They have no concerns. The mask has caused some skin irritation at times, controlled with "A&D ointment"  08/22/14- 47 yoM Down's Syndrome, followed for OSA    Aide here  CPAP 10/ Advanced every night managed by staff at group home The aide for a lot of detail but indicates no reported problems with continued use of CPAP every night. [name] is nonverbal. A medical record note book is reviewed and returned.  09/15/2015-50 year old male Down's Syndrome, followed for OSA  Aide here CPAP 10/[facility] Follows for: OSA. Pt is reported to be wearing his CPAP machine nightly.  [name] is nonverbal. The aide from his care facility says that he sleeps well with CPAP and it is placed on him every night. She reports no problems.  ROS-see HPI- as reported by staff assistant with  him Constitutional:   No-   weight loss, night sweats, fevers, chills, fatigue, lassitude. HEENT:   No-  headaches, difficulty swallowing, tooth/dental problems, sore throat,       No-  sneezing, itching, ear ache, nasal congestion, post nasal drip,  CV:  No-   chest pain, orthopnea, PND, swelling in lower extremities, anasarca, dizziness, palpitations Resp: No-   shortness of breath with exertion or at rest.              No-   productive cough,  No non-productive cough,  No- coughing up of blood.              No-   change in color of mucus.  No- wheezing.   Skin: No-   rash or lesions. GI:  No-   heartburn, indigestion, abdominal pain, nausea, vomiting,  GU: No-   . MS:  No-   joint pain or swelling.  Neuro-     nothing unusual Psych:  No- change in mood or affect. No depression or anxiety.  No memory loss.  OBJ- Physical Exam General- Alert, Aware, Affect-pleasant, Distress- none acute, non-sensical. Overweight Skin-  No breakdown or strap pressure marks. Lymphadenopathy- none Head- atraumatic            Eyes- Gross vision intact, PERRLA, conjunctivae and secretions clear            Ears- Hearing, grossly normal  Nose- Clear, no-Septal dev, mucus, polyps, erosion, perforation             Throat- Mallampati III-IV , mucosa clear , drainage- none, tonsils- atrophic Neck- flexible , trachea midline, no stridor , thyroid nl, carotid no bruit Chest - symmetrical excursion , unlabored           Heart/CV- RRR , no murmur , no gallop  , no rub, nl s1 s2                           - JVD- none , edema- none, stasis changes- none, varices- none           Lung- clear to P&A, wheeze- none, cough- none , dullness-none, rub- none           Chest wall-  Abd-  Br/ Gen/ Rectal- Not done, not indicated Extrem-  Neuro- +severely retarded, nystagmus, gesturing w/ left hand. Few grunts.

## 2015-09-15 NOTE — Assessment & Plan Note (Signed)
He is pretty significantly impaired but seems comfortable and well cared for by my observation.

## 2015-09-15 NOTE — Patient Instructions (Signed)
We are continuing CPAP 10/ Advanced.    Please contact [facility] for any mechanical or service issues with the CPAP machine, masks, filters or supplies.  Please call us if needed

## 2015-09-15 NOTE — Assessment & Plan Note (Signed)
Pressure and compliance are reported quite good. I reminded the aide that they could contact his DME company for any CPAP problems, replacement supplies etc.

## 2015-09-23 ENCOUNTER — Emergency Department (HOSPITAL_COMMUNITY)
Admission: EM | Admit: 2015-09-23 | Discharge: 2015-09-23 | Disposition: A | Discharge status: Home / Self Care | Payer: Medicare Other | Attending: Emergency Medicine | Admitting: Emergency Medicine

## 2015-09-23 ENCOUNTER — Encounter (HOSPITAL_COMMUNITY): Payer: Self-pay

## 2015-09-23 ENCOUNTER — Emergency Department (HOSPITAL_COMMUNITY): Payer: Medicare Other

## 2015-09-23 DIAGNOSIS — Z79899 Other long term (current) drug therapy: Secondary | ICD-10-CM | POA: Insufficient documentation

## 2015-09-23 DIAGNOSIS — E039 Hypothyroidism, unspecified: Secondary | ICD-10-CM | POA: Diagnosis not present

## 2015-09-23 DIAGNOSIS — Z7982 Long term (current) use of aspirin: Secondary | ICD-10-CM | POA: Insufficient documentation

## 2015-09-23 DIAGNOSIS — Q909 Down syndrome, unspecified: Secondary | ICD-10-CM | POA: Insufficient documentation

## 2015-09-23 DIAGNOSIS — R079 Chest pain, unspecified: Secondary | ICD-10-CM | POA: Insufficient documentation

## 2015-09-23 DIAGNOSIS — G4733 Obstructive sleep apnea (adult) (pediatric): Secondary | ICD-10-CM | POA: Diagnosis not present

## 2015-09-23 DIAGNOSIS — R091 Pleurisy: Secondary | ICD-10-CM | POA: Diagnosis present

## 2015-09-23 LAB — CBC WITH DIFFERENTIAL/PLATELET
Basophils Absolute: 0 10*3/uL (ref 0.0–0.1)
Basophils Relative: 1 %
EOS PCT: 0 %
Eosinophils Absolute: 0 10*3/uL (ref 0.0–0.7)
HCT: 45.8 % (ref 39.0–52.0)
HEMOGLOBIN: 15.4 g/dL (ref 13.0–17.0)
LYMPHS ABS: 1.4 10*3/uL (ref 0.7–4.0)
LYMPHS PCT: 26 %
MCH: 32.9 pg (ref 26.0–34.0)
MCHC: 33.6 g/dL (ref 30.0–36.0)
MCV: 97.9 fL (ref 78.0–100.0)
MONOS PCT: 10 %
Monocytes Absolute: 0.5 10*3/uL (ref 0.1–1.0)
NEUTROS PCT: 63 %
Neutro Abs: 3.2 10*3/uL (ref 1.7–7.7)
Platelets: 232 10*3/uL (ref 150–400)
RBC: 4.68 MIL/uL (ref 4.22–5.81)
RDW: 14.1 % (ref 11.5–15.5)
WBC: 5.2 10*3/uL (ref 4.0–10.5)

## 2015-09-23 LAB — D-DIMER, QUANTITATIVE: D-Dimer, Quant: 0.34 ug/mL-FEU (ref 0.00–0.50)

## 2015-09-23 LAB — COMPREHENSIVE METABOLIC PANEL
ALBUMIN: 3.5 g/dL (ref 3.5–5.0)
ALK PHOS: 48 U/L (ref 38–126)
ALT: 11 U/L — AB (ref 17–63)
AST: 20 U/L (ref 15–41)
Anion gap: 10 (ref 5–15)
BILIRUBIN TOTAL: 0.8 mg/dL (ref 0.3–1.2)
BUN: 16 mg/dL (ref 6–20)
CALCIUM: 9.4 mg/dL (ref 8.9–10.3)
CO2: 25 mmol/L (ref 22–32)
CREATININE: 0.92 mg/dL (ref 0.61–1.24)
Chloride: 101 mmol/L (ref 101–111)
GFR calc Af Amer: >60 mL/min (ref >60)
GFR calc non Af Amer: >60 mL/min (ref >60)
GLUCOSE: 100 mg/dL — AB (ref 65–99)
Potassium: 4.1 mmol/L (ref 3.5–5.1)
SODIUM: 136 mmol/L (ref 135–145)
Total Protein: 7.4 g/dL (ref 6.5–8.1)

## 2015-09-23 LAB — I-STAT TROPONIN, ED
Troponin i, poc: 0.01 ng/mL (ref 0.00–0.08)
Troponin i, poc: 0.01 ng/mL (ref 0.00–0.08)

## 2015-09-23 LAB — VALPROIC ACID LEVEL: Valproic Acid Lvl: 65 ug/mL (ref 50.0–100.0)

## 2015-09-23 NOTE — ED Provider Notes (Signed)
CSN: [id number]     Arrival date & time 09/23/15  1704 History   First MD Initiated Contact with Patient 09/23/15 1716     Chief Complaint  Patient presents with  . Pleurisy   LEVEL 5 CAVEAT - PATIENT NON VERBAL  (Consider location/radiation/quality/duration/timing/severity/associated sxs/prior Treatment) HPI  50 year old male with a history of Down syndrome and seizures presents with chest pain and a possible seizure episode. Patient history is taken from family, group home representative, and the person who witnessed the episode. Apparently a staff member saw him walking down the hall and he seemed to have urinated himself and seemed confused. This lasted about 2-3 minutes and then was back to his normal self. She also saw his right arm shaking. Family endorses that he has not had a seizure in a couple years but has had both grand mal seizures in the past as well as focal seizures such as hand shaking. Patient is at his mental baseline right now. The staff member asked him if he was okay and feels hurting anywhere he seemed indicated his chest hurt. Unclear if his chest is still hurting. EMS reports his chest was hurting when he was being palpated. No obvious head injuries or known head injuries. Has not been ill prior to this.  Past Medical History  Diagnosis Date  . Down's syndrome   . Hypothyroid   . Lives in group home     [facility]-[phone]-fax-[phone]-Roxanne-nurse  . Obstructive sleep apnea (adult) (pediatric)     does use a cpap at night  . Seizures (HCC)   . Nystagmus    Past Surgical History  Procedure Laterality Date  . Right elbow surgery    . Orif humerus fracture Left 02/05/2013    Procedure: OPEN REDUCTION INTERNAL FIXATION (ORIF) MEDIAL HUMERUS CONDYLE FRACTURE;  Surgeon: Jodi Marbleavid A Thompson, MD;  Location: [facility];  Service: Orthopedics;  Laterality: Left;  . Irrigation and debridement  07/01/2014        History reviewed. No pertinent family  history. Social History  Substance Use Topics  . Smoking status: Never Smoker   . Smokeless tobacco: None  . Alcohol Use: No    Review of Systems  All other systems reviewed and are negative.     Allergies  Review of patient's allergies indicates no known allergies.  Home Medications   Prior to Admission medications   Medication Sig Start Date End Date Taking? Authorizing Provider  acetaminophen (TYLENOL) 160 MG/5ML elixir Take 15 mg/kg by mouth every 4 (four) hours as needed for fever.    Historical Provider, MD  acetaminophen (TYLENOL) 325 MG tablet Take 650 mg by mouth every 4 (four) hours as needed.    Historical Provider, MD  aspirin 81 MG tablet Take 81 mg by mouth daily.    Historical Provider, MD  bisacodyl (DULCOLAX) 5 MG EC tablet Take 5 mg by mouth daily as needed for moderate constipation.    Historical Provider, MD  Calcium Carb-Cholecalciferol (CALCIUM-VITAMIN D) 500-400 MG-UNIT TABS Take 1 tablet by mouth 2 (two) times daily.    Historical Provider, MD  carbamide peroxide (DEBROX) 6.5 % otic solution 5 drops 2 (two) times daily.    Historical Provider, MD  diphenhydrAMINE (BENADRYL) 25 MG tablet Take 25 mg by mouth every 6 (six) hours as needed.    Historical Provider, MD  divalproex (DEPAKOTE) 250 MG DR tablet Take 500-750 mg by mouth 2 (two) times daily. Takes two tablets (500mg ) in the am and three  tablets ( ) in the evening. Do not crush    Historical Provider, MD  doxycycline (VIBRAMYCIN) 50 MG capsule Take 1 capsule (50 mg total) by mouth 2 (two) times daily. 07/01/14   Richardean Canal, MD  famotidine (PEPCID) 20 MG tablet Take 20 mg by mouth at bedtime.     Historical Provider, MD  Infant Care Products (JOHNSONS BABY SHAMPOO) SHAM 1/2 H2O and 1/2 shampoo-scrub eyebrows qd    Historical Provider, MD  levothyroxine (SYNTHROID, LEVOTHROID) 100 MCG tablet Take 100 mcg by mouth daily before breakfast.    Historical Provider, MD  Loperamide HCl (IMODIUM A-D PO) Take by  mouth.    Historical Provider, MD  promethazine (PHENERGAN) 25 MG tablet Take 25 mg by mouth every 6 (six) hours as needed for nausea or vomiting.    Historical Provider, MD  tolnaftate (TINACTIN) 1 % powder Apply between toes and both feet nightly    Historical Provider, MD  Vitamins A & D (VITAMIN A & D) ointment Apply to both cheeks every morning and each night prior to placing CPAP mask on    Historical Provider, MD   BP 152/85 mmHg  Pulse 92  Temp(Src) 98.7 F (37.1 C) (Oral)  Resp 14  Wt 197 lb (89.359 kg)  SpO2 97% Physical Exam  Constitutional: He appears well-developed and well-nourished.  HENT:  Head: Normocephalic and atraumatic.  Right Ear: External ear normal.  Left Ear: External ear normal.  Nose: Nose normal.  Facies c/w down syndrome  Eyes: Pupils are equal, round, and reactive to light. Right eye exhibits no discharge. Left eye exhibits no discharge.  Neck: Neck supple.  Cardiovascular: Normal rate, regular rhythm, normal heart sounds and intact distal pulses.   Pulmonary/Chest: Effort normal and breath sounds normal.  Does not respond in pain when chest is palpated  Abdominal: Soft. There is no tenderness.  Musculoskeletal: He exhibits no edema.  Neurological: He is alert.  Nonverbal. Appears to have equal strength in all 4 extremities but does not follow commands well  Skin: Skin is warm and dry.  Nursing note and vitals reviewed.   ED Course  Procedures (including critical care time) Labs Review Labs Reviewed  COMPREHENSIVE METABOLIC PANEL - Abnormal; Notable for the following:    Glucose, Bld 100 (*)    ALT 11 (*)    All other components within normal limits  CBC WITH DIFFERENTIAL/PLATELET  D-DIMER, QUANTITATIVE (NOT AT [facility])  VALPROIC ACID LEVEL  URINALYSIS, ROUTINE W REFLEX MICROSCOPIC (NOT AT [facility])  Rosezena Sensor, ED  Rosezena Sensor, ED    Imaging Review Dg Chest 2 View  09/23/2015  CLINICAL DATA:  Chest pain EXAM: CHEST  2 VIEW  COMPARISON:  12/28/2007 FINDINGS: Mild cardiac enlargement. Both lungs are clear. The visualized skeletal structures are unremarkable. IMPRESSION: 1. No acute cardiopulmonary abnormalities. Electronically Signed   By: Signa Kell M.D.   On: 09/23/2015 18:48   I have personally reviewed and evaluated these images and lab results as part of my medical decision-making.   EKG Interpretation   Date/Time:  Tuesday September 23 2015 17:04:32 EDT Ventricular Rate:  83 PR Interval:  161 QRS Duration: 116 QT Interval:  387 QTC Calculation: 455 R Axis:   -34 Text Interpretation:  Sinus rhythm Incomplete right bundle branch block no  significant change since 2007 Confirmed by Dymin Dingledine  MD, Keyli Duross ([id number]) on  09/23/2015 5:32:15 PM      MDM   Final diagnoses:  Nonspecific chest pain  Patient's history is very limited given his Down syndrome. Possibly he had a seizure. No obvious findings that he passed out. Family endorses that he pees on himself often and so thus it does not seem like being comments part is new to indicate a definite seizure. Unclear if he really had chest pain as family states he says yes to many questions. However has 2 negative troponins and a benign ECG. Low risk for PE and has a negative d-dimer. Initially talked about a head CT given possible seizure-like activity although does have a history of prior seizures. He did not want to lay flat and family has decided to hold off on CT scanning. I think this is reasonable given he is acting at his baseline. Patient follow-up with his neurologist as an outpatient as well as PCP.    Pricilla Loveless, MD 09/24/15 [phone]

## 2015-09-23 NOTE — ED Notes (Signed)
Pt. Parents and care taker from group home at bedside.

## 2015-09-23 NOTE — ED Notes (Signed)
Pt. [facility] for chest wall pain today that hurts more with palpation. Pt. At group home and poor history [facility]. Pt. Nonverbal at this time. Pt. Does follow simple commands.

## 2015-09-23 NOTE — ED Notes (Signed)
Attempted blood draw with IV. Blood wouldn't pull back. Phlebotomy notified.

## 2015-09-30 ENCOUNTER — Encounter (HOSPITAL_COMMUNITY): Payer: Self-pay | Admitting: Emergency Medicine

## 2015-09-30 ENCOUNTER — Emergency Department (HOSPITAL_COMMUNITY)
Admission: EM | Admit: 2015-09-30 | Discharge: 2015-09-30 | Disposition: A | Discharge status: Home / Self Care | Payer: Medicare Other | Attending: Emergency Medicine | Admitting: Emergency Medicine

## 2015-09-30 DIAGNOSIS — G4733 Obstructive sleep apnea (adult) (pediatric): Secondary | ICD-10-CM | POA: Insufficient documentation

## 2015-09-30 DIAGNOSIS — Z4800 Encounter for change or removal of nonsurgical wound dressing: Secondary | ICD-10-CM | POA: Diagnosis present

## 2015-09-30 DIAGNOSIS — F419 Anxiety disorder, unspecified: Secondary | ICD-10-CM | POA: Diagnosis not present

## 2015-09-30 DIAGNOSIS — Q909 Down syndrome, unspecified: Secondary | ICD-10-CM | POA: Insufficient documentation

## 2015-09-30 DIAGNOSIS — Z79899 Other long term (current) drug therapy: Secondary | ICD-10-CM | POA: Diagnosis not present

## 2015-09-30 DIAGNOSIS — L539 Erythematous condition, unspecified: Secondary | ICD-10-CM | POA: Diagnosis not present

## 2015-09-30 DIAGNOSIS — E039 Hypothyroidism, unspecified: Secondary | ICD-10-CM | POA: Insufficient documentation

## 2015-09-30 DIAGNOSIS — Z7982 Long term (current) use of aspirin: Secondary | ICD-10-CM | POA: Insufficient documentation

## 2015-09-30 DIAGNOSIS — L989 Disorder of the skin and subcutaneous tissue, unspecified: Secondary | ICD-10-CM | POA: Insufficient documentation

## 2015-09-30 MED ORDER — CEPHALEXIN 500 MG PO CAPS: 500.0000 mg | ORAL_CAPSULE | Freq: Four times a day (QID) | ORAL | Status: DC

## 2015-09-30 MED ORDER — LIDOCAINE-EPINEPHRINE (PF) 2 %-1:200000 IJ SOLN
10.0000 mL | Freq: Once | INTRAMUSCULAR | Status: AC
  Administered 2015-09-30: 10 mL
  Filled 2015-09-30: qty 20

## 2015-09-30 NOTE — ED Provider Notes (Signed)
CSN: [id number]     Arrival date & time 09/30/15  [id number] History  By signing my name below, I, Tanda RockersMargaux Venter, attest that this documentation has been prepared under the direction and in the presence of Lane HackerNicole Layken Doenges, PA-C. Electronically Signed: Tanda RockersMargaux Venter, ED Scribe. 09/30/2015. 10:32 AM.   Chief Complaint  Patient presents with  . Wound Check   LEVEL 5 CAVEAT due to pt being nonverbal   The history is provided by a caregiver. No language interpreter was used.    HPI Comments: [name] is a 50 y.o. male with PKMHx Down's syndrome, who presents to the Emergency Department complaining of a bleeding mole of his left upper abdomen that began bleeding yesterday. Pt is a resident at a group home who applied tape and guaze to stop the bleeding. Bleeding is controlled. Caregiver is unsure if pt has been scratching at the area but does not believe pt is in pain. Caregiver denies fever, chills, or any other associated symptoms.   Past Medical History  Diagnosis Date  . Down's syndrome   . Hypothyroid   . Lives in group home     [facility]-[phone]-[phone]-Roxanne-nurse  . Obstructive sleep apnea (adult) (pediatric)     does use a cpap at night  . Seizures (HCC)   . Nystagmus    Past Surgical History  Procedure Laterality Date  . Right elbow surgery    . Orif humerus fracture Left 02/05/2013    Procedure: OPEN REDUCTION INTERNAL FIXATION (ORIF) MEDIAL HUMERUS CONDYLE FRACTURE;  Surgeon: Jodi Marbleavid A Thompson, MD;  Location: [facility];  Service: Orthopedics;  Laterality: Left;  . Irrigation and debridement  07/01/2014        No family history on file. Social History  Substance Use Topics  . Smoking status: Never Smoker   . Smokeless tobacco: None  . Alcohol Use: No    Review of Systems  Unable to perform ROS: Patient nonverbal   Allergies  Review of patient's allergies indicates no known allergies.  Home Medications   Prior to Admission  medications   Medication Sig Start Date End Date Taking? Authorizing Provider  acetaminophen (TYLENOL) 325 MG tablet Take 650 mg by mouth every 4 (four) hours as needed.    Historical Provider, MD  alum & mag hydroxide-simeth (MAALOX/MYLANTA) 200-200-20 MG/5ML suspension Take 15 mLs by mouth every 2 (two) hours as needed for indigestion or heartburn.    Historical Provider, MD  aspirin 81 MG tablet Take 81 mg by mouth daily.    Historical Provider, MD  bisacodyl (DULCOLAX) 5 MG EC tablet Take 5 mg by mouth daily as needed for moderate constipation.    Historical Provider, MD  brompheniramine-pseudoephedrine (DIMETAPP) 1-15 MG/5ML ELIX Take 20 mLs by mouth every 4 (four) hours as needed for congestion.    Historical Provider, MD  calcitonin, salmon, (MIACALCIN/FORTICAL) 200 UNIT/ACT nasal spray Place 1 spray into alternate nostrils daily.    Historical Provider, MD  Calcium Carb-Cholecalciferol (CALCIUM-VITAMIN D) 500-400 MG-UNIT TABS Take 1 tablet by mouth 2 (two) times daily.    Historical Provider, MD  carbamide peroxide (DEBROX) 6.5 % otic solution Place 5 drops into both ears 2 (two) times daily as needed (ear wax removal).     Historical Provider, MD  Cholecalciferol (VITAMIN D-3) 1000 units CAPS Take 2,000 Units by mouth daily.    Historical Provider, MD  diphenhydrAMINE (BENADRYL) 25 MG tablet Take 25 mg by mouth every 4 (four) hours as needed for allergies.  Historical Provider, MD  divalproex (DEPAKOTE) 250 MG DR tablet Take 500-750 mg by mouth See admin instructions. Takes two tablets ( ) in the am and three tablets ( ) in the evening. Do not crush    Historical Provider, MD  famotidine (PEPCID) 20 MG tablet Take 20 mg by mouth at bedtime.     Historical Provider, MD  guaifenesin (TUSSIN) 100 MG/5ML syrup Take 15 mLs by mouth every 4 (four) hours as needed for cough.    Historical Provider, MD  hydrocortisone cream 1 % Apply 1 application topically daily as needed for itching.     Historical Provider, MD  Infant Care Products (JOHNSONS BABY SHAMPOO) SHAM 1/2 H2O and 1/2 shampoo-scrub eyebrows qd    Historical Provider, MD  levothyroxine (SYNTHROID, LEVOTHROID) 100 MCG tablet Take 100 mcg by mouth daily before breakfast.    Historical Provider, MD  Loperamide HCl (IMODIUM A-D PO) Take 2 tablets by mouth daily as needed (loose stools).     Historical Provider, MD  magnesium hydroxide (MILK OF MAGNESIA) 400 MG/5ML suspension Take 30 mLs by mouth daily as needed for mild constipation.    Historical Provider, MD  promethazine (PHENERGAN) 25 MG tablet Take 25 mg by mouth every 6 (six) hours as needed for nausea or vomiting.    Historical Provider, MD  Skin Protectants, Misc. (LIP BALM) OINT Apply 1 application topically daily as needed (sunburn / chap lips).    Historical Provider, MD  SUNSCREEN SPF30 EX Apply 1 application topically daily as needed (prevent sunburn).    Historical Provider, MD  tolnaftate (TINACTIN) 1 % powder Apply between toes and both feet nightly    Historical Provider, MD  Vitamins A & D (VITAMIN A & D) ointment Apply to both cheeks every morning and each night prior to placing CPAP mask on    Historical Provider, MD   BP 166/81 mmHg  Pulse 100  Temp(Src) 98.4 F (36.9 C) (Oral)  Resp 20  SpO2 100%   Physical Exam  Constitutional: He appears well-nourished.  HENT:  Head: Normocephalic and atraumatic.  Eyes: Conjunctivae and EOM are normal.  Neck: Neck supple.  Cardiovascular: Normal rate.   Pulmonary/Chest: Effort normal. No respiratory distress.  Abdominal: Soft.  Musculoskeletal: Normal range of motion.  Neurological: He is alert.  Skin: Skin is warm and dry.  A 0.5 cm papillomatous lesion on left upper quadrant. There is surrounding erythema in a linear pattern consistent with contact dermatitis, most likely from tape used in group home.   Psychiatric:  Anxious appearing, nonverbal  Nursing note and vitals reviewed.   ED Course   Procedures   DIAGNOSTIC STUDIES: Oxygen Saturation is 100% on RA, normal by my interpretation.    COORDINATION OF CARE: 10:31 AM- Will order lidocaine to remove mole. Caregiver agrees to plan.   MDM   Final diagnoses:  Skin lesion   Patient nontoxic appearing, VSS. Attempted to remove the mole- patient would not hold still during the betadine application and he was pushing my hand away. I explained to his caregiver that I do not feel he will tolerate the procedure well. The caregiver called the facility for me to speak with them regarding the patient's visit today, and I advised either follow-up with a dermatologist or to set up an appointment where patient can be somewhat sedated, otherwise it would not be safe to move forward with the procedure. Patient's POA is not in attendance, so patient would not be able to receive any sedation today.  Discussed case with Dr. Manus Gunning who agrees with above plan.  I personally performed the services described in this documentation, which was scribed in my presence. The recorded information has been reviewed and is accurate.   Melton Krebs, PA-C 10/07/15 1123  Glynn Octave, MD 10/08/15 [id number]

## 2015-09-30 NOTE — Discharge Instructions (Signed)
Mr. [name],  Nice meeting you! The skin lesion removal procedure (more information regarding this below) was canceled today due to patient anxiety. He will need something to help calm his anxiety prior to attempting this. To prevent a skin infection, I am giving you antibiotics. Please take all of these as written. Please follow-up with a dermatologist or your primary care provider. Return to the emergency department if you develop fevers, chills, redness around the skin lesion (the redness that is in existence there now resembles an allergic reaction to tape that has been there previously), yellow/green drainage, new/worsening symptoms. Feel better soon!  S. Manuel HackerNicole Jemari Hallum, PA-C

## 2015-09-30 NOTE — ED Notes (Signed)
Discharge instructions reviewed with caregiver at bedside.

## 2015-09-30 NOTE — ED Notes (Signed)
Bleeding mole left upper abd. Pt has MR and stays at a group home. Bleeding stopped at this time.

## 2016-06-04 ENCOUNTER — Emergency Department (HOSPITAL_COMMUNITY)
Admission: EM | Admit: 2016-06-04 | Discharge: 2016-06-04 | Disposition: A | Discharge status: Home / Self Care | Payer: Medicare Other | Attending: Physician Assistant | Admitting: Physician Assistant

## 2016-06-04 ENCOUNTER — Encounter (HOSPITAL_COMMUNITY): Payer: Self-pay | Admitting: Nurse Practitioner

## 2016-06-04 DIAGNOSIS — Z79899 Other long term (current) drug therapy: Secondary | ICD-10-CM | POA: Insufficient documentation

## 2016-06-04 DIAGNOSIS — E039 Hypothyroidism, unspecified: Secondary | ICD-10-CM | POA: Insufficient documentation

## 2016-06-04 DIAGNOSIS — Z7982 Long term (current) use of aspirin: Secondary | ICD-10-CM | POA: Diagnosis not present

## 2016-06-04 DIAGNOSIS — G40909 Epilepsy, unspecified, not intractable, without status epilepticus: Secondary | ICD-10-CM | POA: Insufficient documentation

## 2016-06-04 DIAGNOSIS — R569 Unspecified convulsions: Secondary | ICD-10-CM

## 2016-06-04 LAB — CBG MONITORING, ED: Glucose-Capillary: 142 mg/dL — ABNORMAL HIGH (ref 65–99)

## 2016-06-04 NOTE — ED Provider Notes (Addendum)
[facility]-EMERGENCY DEPT Provider Note   CSN: [id number] Arrival date & time: 06/04/16  1319     History   Chief Complaint Chief Complaint  Patient presents with  . Seizures    HPI [name] is a 51 y.o. male.   Seizures   This is a recurrent problem. The current episode started 12 to 24 hours ago. The problem has not changed since onset.There was 1 seizure. The most recent episode lasted less than 30 seconds. Pertinent negatives include no chest pain. Characteristics include bladder incontinence. The episode was witnessed. There was no sensation of an aura present. The seizures did not continue in the ED. The seizure(s) had no focality. Possible causes do not include medication or dosage change, sleep deprivation, missed seizure meds, recent illness or change in alcohol use. There has been no fever. There were no medications administered prior to arrival.    Past Medical History:  Diagnosis Date  . Down's syndrome   . Hypothyroid   . Lives in group home    [facility]-[phone]-[phone]-Roxanne-nurse  . Nystagmus   . Obstructive sleep apnea (adult) (pediatric)    does use a cpap at night  . Seizures [facility])     Patient Active Problem List   Diagnosis Date Noted  . Obstructive sleep apnea 07/05/2008  . DOWNS SYNDROME 07/05/2008    Past Surgical History:  Procedure Laterality Date  . IRRIGATION AND DEBRIDEMENT  07/01/2014      . ORIF HUMERUS FRACTURE Left 02/05/2013   Procedure: OPEN REDUCTION INTERNAL FIXATION (ORIF) MEDIAL HUMERUS CONDYLE FRACTURE;  Surgeon: Jodi Marble, MD;  Location: [facility];  Service: Orthopedics;  Laterality: Left;  . Right Elbow surgery         Home Medications    Prior to Admission medications   Medication Sig Start Date End Date Taking? Authorizing Provider  acetaminophen (TYLENOL) 325 MG tablet Take 650 mg by mouth every 4 (four) hours as needed.    Historical Provider, MD  alum & mag hydroxide-simeth  (MAALOX/MYLANTA) 200-200-20 MG/5ML suspension Take 15 mLs by mouth every 2 (two) hours as needed for indigestion or heartburn.    Historical Provider, MD  aspirin 81 MG tablet Take 81 mg by mouth daily.    Historical Provider, MD  bisacodyl (DULCOLAX) 5 MG EC tablet Take 5 mg by mouth daily as needed for moderate constipation.    Historical Provider, MD  brompheniramine-pseudoephedrine (DIMETAPP) 1-15 MG/5ML ELIX Take 20 mLs by mouth every 4 (four) hours as needed for congestion.    Historical Provider, MD  calcitonin, salmon, (MIACALCIN/FORTICAL) 200 UNIT/ACT nasal spray Place 1 spray into alternate nostrils daily.    Historical Provider, MD  Calcium Carb-Cholecalciferol (CALCIUM-VITAMIN D) 500-400 MG-UNIT TABS Take 1 tablet by mouth 2 (two) times daily.    Historical Provider, MD  carbamide peroxide (DEBROX) 6.5 % otic solution Place 5 drops into both ears 2 (two) times daily as needed (ear wax removal).     Historical Provider, MD  cephALEXin (KEFLEX) 500 MG capsule Take 1 capsule (500 mg total) by mouth 4 (four) times daily. 09/30/15   Melton Krebs, PA-C  Cholecalciferol (VITAMIN D-3) 1000 units CAPS Take 2,000 Units by mouth daily.    Historical Provider, MD  diphenhydrAMINE (BENADRYL) 25 MG tablet Take 25 mg by mouth every 4 (four) hours as needed for allergies.     Historical Provider, MD  divalproex (DEPAKOTE) 250 MG DR tablet Take 500-750 mg by mouth See admin instructions. Takes  two tablets (500mg ) in the am and three tablets (750mg ) in the evening. Do not crush    Historical Provider, MD  famotidine (PEPCID) 20 MG tablet Take 20 mg by mouth at bedtime.     Historical Provider, MD  guaifenesin (TUSSIN) 100 MG/5ML syrup Take 15 mLs by mouth every 4 (four) hours as needed for cough.    Historical Provider, MD  hydrocortisone cream 1 % Apply 1 application topically daily as needed for itching.    Historical Provider, MD  Infant Care Products (JOHNSONS BABY SHAMPOO) SHAM 1/2 H2O and 1/2  shampoo-scrub eyebrows qd    Historical Provider, MD  levothyroxine (SYNTHROID, LEVOTHROID) 100 MCG tablet Take 100 mcg by mouth daily before breakfast.    Historical Provider, MD  Loperamide HCl (IMODIUM A-D PO) Take 2 tablets by mouth daily as needed (loose stools).     Historical Provider, MD  magnesium hydroxide (MILK OF MAGNESIA) 400 MG/5ML suspension Take 30 mLs by mouth daily as needed for mild constipation.    Historical Provider, MD  promethazine (PHENERGAN) 25 MG tablet Take 25 mg by mouth every 6 (six) hours as needed for nausea or vomiting.    Historical Provider, MD  Skin Protectants, Misc. (LIP BALM) OINT Apply 1 application topically daily as needed (sunburn / chap lips).    Historical Provider, MD  SUNSCREEN SPF30 EX Apply 1 application topically daily as needed (prevent sunburn).    Historical Provider, MD  tolnaftate (TINACTIN) 1 % powder Apply between toes and both feet nightly    Historical Provider, MD  Vitamins A & D (VITAMIN A & D) ointment Apply to both cheeks every morning and each night prior to placing CPAP mask on    Historical Provider, MD    Family History History reviewed. No pertinent family history.  Social History Social History  Substance Use Topics  . Smoking status: Never Smoker  . Smokeless tobacco: Never Used  . Alcohol use No     Allergies   Patient has no known allergies.   Review of Systems Review of Systems  Constitutional: Negative for activity change, appetite change, fatigue and fever.  Respiratory: Negative for shortness of breath and wheezing.   Cardiovascular: Negative for chest pain.  Gastrointestinal: Negative for abdominal pain.  Genitourinary: Positive for bladder incontinence. Negative for dysuria and frequency.  Neurological: Positive for seizures.  All other systems reviewed and are negative.    Physical Exam Updated Vital Signs BP 137/85 (BP Location: Left Arm)   Pulse 94   Temp 98.3 F (36.8 C) (Oral)   Resp 20    SpO2 96%   Physical Exam  Constitutional: He appears well-developed and well-nourished. No distress.  HENT:  Head: Normocephalic.  Cardiovascular: Normal rate.   Pulmonary/Chest: Effort normal.  Musculoskeletal: Normal range of motion.  Neurological: He is alert.  Exam consistent with downs syndrome  Skin: Skin is warm and dry. He is not diaphoretic.     ED Treatments / Results  Labs (all labs ordered are listed, but only abnormal results are displayed) Labs Reviewed  CBG MONITORING, ED - Abnormal; Notable for the following:       Result Value   Glucose-Capillary 142 (*)    All other components within normal limits    EKG  EKG Interpretation None       Radiology No results found.  Procedures Procedures (including critical care time)  Medications Ordered in ED Medications - No data to display   Initial Impression / Assessment  and Plan / ED Course  I have reviewed the triage vital signs and the nursing notes.  Pertinent labs & imaging results that were available during my care of the patient were reviewed by me and considered in my medical decision making (see chart for details).  Clinical Course    Pt is a 63 male with downs, MR, seizure disorder here with one seizure earlier today, at 84 am. Witnesssed, self ended. No striking head. Has had no symptoms before or after.  Here with group home worker and father (an MD). Pt does not likel to be touched, anxious in room.   I do not see any need to do any work up currently. Known disorder, no symptoms. Normal vitasl.  Appears in normal state of health. Offered labs, CXR, UA, and levels. Father does not want work up. Group home MD sent him here before discussion with parents.   Will have him follow up with neurology.  Final Clinical Impressions(s) / ED Diagnoses   Final diagnoses:  None    New Prescriptions New Prescriptions   No medications on file     Ananth Fiallos Randall An, MD 06/04/16 1923      Abra Lingenfelter Randall An, MD 06/04/16 1948

## 2016-06-04 NOTE — Discharge Instructions (Signed)
Right now [name] has no symptoms, no cough no fever and no complaints. Therefore I do not think that workup is needed at this time. However if he develops any symptoms ot you change your mind about wanting to do blood work and x-rays please return to the emergency department.

## 2016-06-04 NOTE — ED Triage Notes (Signed)
Pt presents with c/o seizure. Pt was brought by staff member from healthcare facility home where he lives. Staff witnessed the pt with approximately 30 seconds of seizure activity this morning. Patient is back to baseline now per staff. Pt has a history of seizure disorder and has been taking daily seizure medication as prescribed according to staff.

## 2016-06-04 NOTE — ED Notes (Signed)
Pt refused to be placed on monitored. Reviewed d/c instructions with father and caregiver. Pt departed in NAD, refused use of wheelchair.

## 2016-06-08 DIAGNOSIS — G40919 Epilepsy, unspecified, intractable, without status epilepticus: Secondary | ICD-10-CM | POA: Diagnosis not present

## 2016-06-29 ENCOUNTER — Encounter (HOSPITAL_COMMUNITY): Payer: Self-pay | Admitting: Emergency Medicine

## 2016-06-29 ENCOUNTER — Ambulatory Visit (INDEPENDENT_AMBULATORY_CARE_PROVIDER_SITE_OTHER): Payer: Medicare Other

## 2016-06-29 ENCOUNTER — Ambulatory Visit (HOSPITAL_COMMUNITY)
Admission: EM | Admit: 2016-06-29 | Discharge: 2016-06-29 | Disposition: A | Discharge status: Home / Self Care | Payer: Medicare Other | Attending: Family Medicine | Admitting: Family Medicine

## 2016-06-29 DIAGNOSIS — S52022A Displaced fracture of olecranon process without intraarticular extension of left ulna, initial encounter for closed fracture: Secondary | ICD-10-CM

## 2016-06-29 DIAGNOSIS — W19XXXA Unspecified fall, initial encounter: Secondary | ICD-10-CM

## 2016-06-29 NOTE — Discharge Instructions (Signed)
Wear the arm sling at all times. He may apply cold or ice packs to the elbow often known during the day. Be sure to call the orthopedist listed on this page to obtain an appointment. He may administer Tylenol for pain if needed.

## 2016-06-29 NOTE — ED Triage Notes (Signed)
Pt lives in a group facility.  He was witnessed tripping over a chair and falling on his left side. Pt is not verbal but has indicated pain in his left shoulder/elbow/upper arm by holding it.  He had surgery on his left elbow a few years ago and his father believes he has hardware in the left elbow.

## 2016-06-29 NOTE — ED Provider Notes (Signed)
CSN: [id number]     Arrival date & time 06/29/16  1212 History   First MD Initiated Contact with Patient 06/29/16 1316     Chief Complaint  Patient presents with  . Fall   (Consider location/radiation/quality/duration/timing/severity/associated sxs/prior Treatment) 51 year old male with Down syndrome fell yesterday onto his left arm. While getting dressed this morning he went that were trying to put on his shirt. He is nonverbal and unable to express himself even with difficulty expressing injury and pain. He fell on his left arm.      Past Medical History:  Diagnosis Date  . Down's syndrome   . Hypothyroid   . Lives in group home    [facility]-[phone]-fax-[phone]-Roxanne-nurse  . Nystagmus   . Obstructive sleep apnea (adult) (pediatric)    does use a cpap at night  . Seizures (HCC)    Past Surgical History:  Procedure Laterality Date  . IRRIGATION AND DEBRIDEMENT  07/01/2014      . ORIF HUMERUS FRACTURE Left 02/05/2013   Procedure: OPEN REDUCTION INTERNAL FIXATION (ORIF) MEDIAL HUMERUS CONDYLE FRACTURE;  Surgeon: Jodi Marble, MD;  Location: [facility];  Service: Orthopedics;  Laterality: Left;  . Right Elbow surgery     History reviewed. No pertinent family history. Social History  Substance Use Topics  . Smoking status: Never Smoker  . Smokeless tobacco: Never Used  . Alcohol use No    Review of Systems  Unable to perform ROS: Patient nonverbal    Allergies  Patient has no known allergies.  Home Medications   Prior to Admission medications   Medication Sig Start Date End Date Taking? Authorizing Provider  aspirin 81 MG tablet Take 81 mg by mouth daily.   Yes Historical Provider, MD  benazepril (LOTENSIN) 10 MG tablet Take 10 mg by mouth daily.   Yes Historical Provider, MD  calcitonin, salmon, (MIACALCIN/FORTICAL) 200 UNIT/ACT nasal spray Place 1 spray into alternate nostrils daily.   Yes Historical Provider, MD  Calcium  Carb-Cholecalciferol (CALCIUM-VITAMIN D) 500-400 MG-UNIT TABS Take 1 tablet by mouth 2 (two) times daily.   Yes Historical Provider, MD  divalproex (DEPAKOTE) 250 MG DR tablet Take 500-750 mg by mouth See admin instructions. Takes two tablets (500mg ) in the am and three tablets (750mg ) in the evening. Do not crush   Yes Historical Provider, MD  famotidine (PEPCID) 20 MG tablet Take 20 mg by mouth at bedtime.    Yes Historical Provider, MD  Infant Care Products (JOHNSONS BABY SHAMPOO) SHAM 1/2 H2O and 1/2 shampoo-scrub eyebrows qd   Yes Historical Provider, MD  levothyroxine (SYNTHROID, LEVOTHROID) 100 MCG tablet Take 100 mcg by mouth daily before breakfast.   Yes Historical Provider, MD  tolnaftate (TINACTIN) 1 % powder Apply between toes and both feet nightly   Yes Historical Provider, MD  acetaminophen (TYLENOL) 325 MG tablet Take 650 mg by mouth every 4 (four) hours as needed.    Historical Provider, MD  alum & mag hydroxide-simeth (MAALOX/MYLANTA) 200-200-20 MG/5ML suspension Take 15 mLs by mouth every 2 (two) hours as needed for indigestion or heartburn.    Historical Provider, MD  bisacodyl (DULCOLAX) 5 MG EC tablet Take 5 mg by mouth daily as needed for moderate constipation.    Historical Provider, MD  brompheniramine-pseudoephedrine (DIMETAPP) 1-15 MG/5ML ELIX Take 20 mLs by mouth every 4 (four) hours as needed for congestion.    Historical Provider, MD  carbamide peroxide (DEBROX) 6.5 % otic solution Place 5 drops into both ears 2 (  two) times daily as needed (ear wax removal).     Historical Provider, MD  cephALEXin (KEFLEX) 500 MG capsule Take 1 capsule (500 mg total) by mouth 4 (four) times daily. 09/30/15   Melton KrebsSamantha Nicole Riley, PA-C  Cholecalciferol (VITAMIN D-3) 1000 units CAPS Take 2,000 Units by mouth daily.    Historical Provider, MD  diphenhydrAMINE (BENADRYL) 25 MG tablet Take 25 mg by mouth every 4 (four) hours as needed for allergies.     Historical Provider, MD  guaifenesin  (TUSSIN) 100 MG/5ML syrup Take 15 mLs by mouth every 4 (four) hours as needed for cough.    Historical Provider, MD  hydrocortisone cream 1 % Apply 1 application topically daily as needed for itching.    Historical Provider, MD  Loperamide HCl (IMODIUM A-D PO) Take 2 tablets by mouth daily as needed (loose stools).     Historical Provider, MD  magnesium hydroxide (MILK OF MAGNESIA) 400 MG/5ML suspension Take 30 mLs by mouth daily as needed for mild constipation.    Historical Provider, MD  promethazine (PHENERGAN) 25 MG tablet Take 25 mg by mouth every 6 (six) hours as needed for nausea or vomiting.    Historical Provider, MD  Skin Protectants, Misc. (LIP BALM) OINT Apply 1 application topically daily as needed (sunburn / chap lips).    Historical Provider, MD  SUNSCREEN SPF30 EX Apply 1 application topically daily as needed (prevent sunburn).    Historical Provider, MD  Vitamins A & D (VITAMIN A & D) ointment Apply to both cheeks every morning and each night prior to placing CPAP mask on    Historical Provider, MD   Meds Ordered and Administered this Visit  Medications - No data to display  Pulse 96   Temp 98 F (36.7 C) (Oral)   SpO2 97%  No data found.   Physical Exam  Constitutional: He appears well-developed and well-nourished. No distress.  Neck: Normal range of motion.  Cardiovascular: Normal rate, regular rhythm and intact distal pulses.   Pulmonary/Chest: Effort normal.  Abdominal: Soft.  Musculoskeletal: He exhibits no edema.  The patient has a history of fracture of both elbows with internal fixation devices. There is a pre-existing limitation and flexion and extension of the left elbow. The entire left upper extremity was evaluated with full range of motion of the shoulder. Palpation of the shoulder and humerus and forearm does not induce any pain response. No tenderness or swelling to the forearm, wrist or hand. No swelling appreciated. Normal color and warmth.  Neurological:  He is alert.  Skin: Skin is warm and dry.  Nursing note and vitals reviewed.   Urgent Care Course     Procedures (including critical care time)  Labs Review Labs Reviewed - No data to display  Imaging Review Dg Humerus Left  Result Date: 06/29/2016 CLINICAL DATA:  Patients father states that he fell yesterday and is now having pain in the left arm. Hx of surgery to the left elbow. Patient has mental delay EXAM: LEFT HUMERUS - 2+ VIEW COMPARISON:  02/05/2013 FINDINGS: There is an acute nondisplaced fracture of the olecranon of the ulna. No other acute fractures. Shoulder and elbow joints are normally aligned. Whole fracture is been reduced with a ulnar-sided fixation plate and multiple screws extending from the distal humeral metadiaphysis to the medial epicondyles. Orthopedic hardware is well-seated with no evidence of loosening. Soft tissues are unremarkable. IMPRESSION: 1. Acute nondisplaced fracture of the olecranon of the ulna. No dislocation. Electronically Signed  By: Amie Portland M.D.   On: 06/29/2016 13:40     Visual Acuity Review  Right Eye Distance:   Left Eye Distance:   Bilateral Distance:    Right Eye Near:   Left Eye Near:    Bilateral Near:         MDM   1. Fall, initial encounter   2. Olecranon fracture, left, closed, initial encounter    Wear the arm sling at all times. He may apply cold or ice packs to the elbow often known during the day. Be sure to call the orthopedist listed on this page to obtain an appointment. He may administer Tylenol for pain if needed. Arm sling    Hayden Rasmussen, NP 06/29/16 1432

## 2016-06-30 DIAGNOSIS — S52032A Displaced fracture of olecranon process with intraarticular extension of left ulna, initial encounter for closed fracture: Secondary | ICD-10-CM | POA: Diagnosis not present

## 2016-07-08 DIAGNOSIS — S52032D Displaced fracture of olecranon process with intraarticular extension of left ulna, subsequent encounter for closed fracture with routine healing: Secondary | ICD-10-CM | POA: Diagnosis not present

## 2016-07-27 DIAGNOSIS — K219 Gastro-esophageal reflux disease without esophagitis: Secondary | ICD-10-CM | POA: Diagnosis not present

## 2016-07-27 DIAGNOSIS — Q909 Down syndrome, unspecified: Secondary | ICD-10-CM | POA: Diagnosis not present

## 2016-07-27 DIAGNOSIS — E039 Hypothyroidism, unspecified: Secondary | ICD-10-CM | POA: Diagnosis not present

## 2016-07-27 DIAGNOSIS — G40919 Epilepsy, unspecified, intractable, without status epilepticus: Secondary | ICD-10-CM | POA: Diagnosis not present

## 2016-07-28 DIAGNOSIS — R300 Dysuria: Secondary | ICD-10-CM | POA: Diagnosis not present

## 2016-07-28 DIAGNOSIS — N390 Urinary tract infection, site not specified: Secondary | ICD-10-CM | POA: Diagnosis not present

## 2016-08-05 DIAGNOSIS — M542 Cervicalgia: Secondary | ICD-10-CM | POA: Diagnosis not present

## 2016-08-05 DIAGNOSIS — S52032D Displaced fracture of olecranon process with intraarticular extension of left ulna, subsequent encounter for closed fracture with routine healing: Secondary | ICD-10-CM | POA: Diagnosis not present

## 2016-08-10 DIAGNOSIS — E039 Hypothyroidism, unspecified: Secondary | ICD-10-CM | POA: Diagnosis not present

## 2016-08-10 DIAGNOSIS — Q909 Down syndrome, unspecified: Secondary | ICD-10-CM | POA: Diagnosis not present

## 2016-08-10 DIAGNOSIS — G40919 Epilepsy, unspecified, intractable, without status epilepticus: Secondary | ICD-10-CM | POA: Diagnosis not present

## 2016-08-24 DIAGNOSIS — G40919 Epilepsy, unspecified, intractable, without status epilepticus: Secondary | ICD-10-CM | POA: Diagnosis not present

## 2016-08-24 DIAGNOSIS — E039 Hypothyroidism, unspecified: Secondary | ICD-10-CM | POA: Diagnosis not present

## 2016-08-24 DIAGNOSIS — Q909 Down syndrome, unspecified: Secondary | ICD-10-CM | POA: Diagnosis not present

## 2016-08-26 DIAGNOSIS — S52032D Displaced fracture of olecranon process with intraarticular extension of left ulna, subsequent encounter for closed fracture with routine healing: Secondary | ICD-10-CM | POA: Diagnosis not present

## 2016-08-27 DIAGNOSIS — R609 Edema, unspecified: Secondary | ICD-10-CM | POA: Diagnosis not present

## 2016-08-27 DIAGNOSIS — Z79899 Other long term (current) drug therapy: Secondary | ICD-10-CM | POA: Diagnosis not present

## 2016-08-27 DIAGNOSIS — I10 Essential (primary) hypertension: Secondary | ICD-10-CM | POA: Diagnosis not present

## 2016-08-31 DIAGNOSIS — E663 Overweight: Secondary | ICD-10-CM | POA: Diagnosis not present

## 2016-08-31 DIAGNOSIS — G40919 Epilepsy, unspecified, intractable, without status epilepticus: Secondary | ICD-10-CM | POA: Diagnosis not present

## 2016-09-07 DIAGNOSIS — K219 Gastro-esophageal reflux disease without esophagitis: Secondary | ICD-10-CM | POA: Diagnosis not present

## 2016-09-07 DIAGNOSIS — G40919 Epilepsy, unspecified, intractable, without status epilepticus: Secondary | ICD-10-CM | POA: Diagnosis not present

## 2016-09-07 DIAGNOSIS — Q909 Down syndrome, unspecified: Secondary | ICD-10-CM | POA: Diagnosis not present

## 2016-09-16 ENCOUNTER — Encounter (HOSPITAL_COMMUNITY): Payer: Self-pay

## 2016-09-16 ENCOUNTER — Emergency Department (HOSPITAL_COMMUNITY)
Admission: EM | Admit: 2016-09-16 | Discharge: 2016-09-16 | Disposition: A | Discharge status: Home / Self Care | Payer: Medicare Other | Attending: Emergency Medicine | Admitting: Emergency Medicine

## 2016-09-16 DIAGNOSIS — E039 Hypothyroidism, unspecified: Secondary | ICD-10-CM | POA: Insufficient documentation

## 2016-09-16 DIAGNOSIS — R569 Unspecified convulsions: Secondary | ICD-10-CM

## 2016-09-16 DIAGNOSIS — Z7982 Long term (current) use of aspirin: Secondary | ICD-10-CM | POA: Diagnosis not present

## 2016-09-16 DIAGNOSIS — G40909 Epilepsy, unspecified, not intractable, without status epilepticus: Secondary | ICD-10-CM | POA: Diagnosis not present

## 2016-09-16 DIAGNOSIS — Z79899 Other long term (current) drug therapy: Secondary | ICD-10-CM | POA: Insufficient documentation

## 2016-09-16 LAB — VALPROIC ACID LEVEL: Valproic Acid Lvl: 94 ug/mL (ref 50.0–100.0)

## 2016-09-16 NOTE — ED Triage Notes (Signed)
Pt brought in by EMS due to having a seizure while at an adult daycare. Per EMS pt was sitting in chair and had a witnessed seizure that lasted approximately 30 seconds. Pt did not fall or hit head. Pt was incontinent and bit his tongue. Pt will only say a few words.

## 2016-09-16 NOTE — ED Notes (Signed)
Pt given a cup of water per Nicole(RN)

## 2016-09-16 NOTE — Discharge Instructions (Signed)
Given that this was your second seizure this year, I would like for you to follow up with a neurologist as an outpatient

## 2016-09-16 NOTE — ED Notes (Signed)
Pt ambulated to restroom with father present, no issues. Pt placed back on monitor.

## 2016-09-16 NOTE — ED Provider Notes (Signed)
[facility]-EMERGENCY DEPT Provider Note   CSN: [id number] Arrival date & time: 09/16/16  1225     History   Chief Complaint Chief Complaint  Patient presents with  . Seizures   Level V caveat: Down syndrome  HPI [name] is a 51 y.o. male.  The history is provided by the patient.   Patient presents the emergency department after witnessed grand mal seizure by staff that care for him as he has Down syndrome.  He has a known history of seizures for which she is on Depakote.  His father, a retired Clinical research associate, states that his weight has been stable for the past several years.  He has had no recent changes to his medications.  Staff members with him currently states he's been compliant with his medications and takes his oral medications without issue.  His had no recent illness.  His father reports they did have one other possible seizure in January 2018 but otherwise usually only has approximately one seizure a year.  He is not being followed by neurology currently.father reports that his son is currently at baseline.  There is no reported fall or injury from the seizure today.  This happened while he was in the seated position.       Past Medical History:  Diagnosis Date  . Down's syndrome   . Hypothyroid   . Lives in group home    [facility]-[phone]-[phone]-Roxanne-nurse  . Nystagmus   . Obstructive sleep apnea (adult) (pediatric)    does use a cpap at night  . Seizures [facility])     Patient Active Problem List   Diagnosis Date Noted  . Obstructive sleep apnea 07/05/2008  . DOWNS SYNDROME 07/05/2008    Past Surgical History:  Procedure Laterality Date  . IRRIGATION AND DEBRIDEMENT  07/01/2014      . ORIF HUMERUS FRACTURE Left 02/05/2013   Procedure: OPEN REDUCTION INTERNAL FIXATION (ORIF) MEDIAL HUMERUS CONDYLE FRACTURE;  Surgeon: Jodi Marble, MD;  Location: [facility];  Service: Orthopedics;  Laterality: Left;  . Right Elbow  surgery         Home Medications    Prior to Admission medications   Medication Sig Start Date End Date Taking? Authorizing Provider  acetaminophen (TYLENOL) 325 MG tablet Take 650 mg by mouth every 4 (four) hours as needed.    Historical Provider, MD  alum & mag hydroxide-simeth (MAALOX/MYLANTA) 200-200-20 MG/5ML suspension Take 15 mLs by mouth every 2 (two) hours as needed for indigestion or heartburn.    Historical Provider, MD  aspirin 81 MG tablet Take 81 mg by mouth daily.    Historical Provider, MD  benazepril (LOTENSIN) 10 MG tablet Take 10 mg by mouth daily.    Historical Provider, MD  bisacodyl (DULCOLAX) 5 MG EC tablet Take 5 mg by mouth daily as needed for moderate constipation.    Historical Provider, MD  brompheniramine-pseudoephedrine (DIMETAPP) 1-15 MG/5ML ELIX Take 20 mLs by mouth every 4 (four) hours as needed for congestion.    Historical Provider, MD  calcitonin, salmon, (MIACALCIN/FORTICAL) 200 UNIT/ACT nasal spray Place 1 spray into alternate nostrils daily.    Historical Provider, MD  Calcium Carb-Cholecalciferol (CALCIUM-VITAMIN D) 500-400 MG-UNIT TABS Take 1 tablet by mouth 2 (two) times daily.    Historical Provider, MD  carbamide peroxide (DEBROX) 6.5 % otic solution Place 5 drops into both ears 2 (two) times daily as needed (ear wax removal).     Historical Provider, MD  cephALEXin (KEFLEX) 500 MG  capsule Take 1 capsule (500 mg total) by mouth 4 (four) times daily. 09/30/15   Melton Krebs, PA-C  Cholecalciferol (VITAMIN D-3) 1000 units CAPS Take 2,000 Units by mouth daily.    Historical Provider, MD  diphenhydrAMINE (BENADRYL) 25 MG tablet Take 25 mg by mouth every 4 (four) hours as needed for allergies.     Historical Provider, MD  divalproex (DEPAKOTE) 250 MG DR tablet Take 500-750 mg by mouth See admin instructions. Takes two tablets ( ) in the am and three tablets ( ) in the evening. Do not crush    Historical Provider, MD  famotidine (PEPCID) 20  MG tablet Take 20 mg by mouth at bedtime.     Historical Provider, MD  guaifenesin (TUSSIN) 100 MG/5ML syrup Take 15 mLs by mouth every 4 (four) hours as needed for cough.    Historical Provider, MD  hydrocortisone cream 1 % Apply 1 application topically daily as needed for itching.    Historical Provider, MD  Infant Care Products (JOHNSONS BABY SHAMPOO) SHAM 1/2 H2O and 1/2 shampoo-scrub eyebrows qd    Historical Provider, MD  levothyroxine (SYNTHROID, LEVOTHROID) 100 MCG tablet Take 100 mcg by mouth daily before breakfast.    Historical Provider, MD  Loperamide HCl (IMODIUM A-D PO) Take 2 tablets by mouth daily as needed (loose stools).     Historical Provider, MD  magnesium hydroxide (MILK OF MAGNESIA) 400 MG/5ML suspension Take 30 mLs by mouth daily as needed for mild constipation.    Historical Provider, MD  promethazine (PHENERGAN) 25 MG tablet Take 25 mg by mouth every 6 (six) hours as needed for nausea or vomiting.    Historical Provider, MD  Skin Protectants, Misc. (LIP BALM) OINT Apply 1 application topically daily as needed (sunburn / chap lips).    Historical Provider, MD  SUNSCREEN SPF30 EX Apply 1 application topically daily as needed (prevent sunburn).    Historical Provider, MD  tolnaftate (TINACTIN) 1 % powder Apply between toes and both feet nightly    Historical Provider, MD  Vitamins A & D (VITAMIN A & D) ointment Apply to both cheeks every morning and each night prior to placing CPAP mask on    Historical Provider, MD    Family History No family history on file.  Social History Social History  Substance Use Topics  . Smoking status: Never Smoker  . Smokeless tobacco: Never Used  . Alcohol use No     Allergies   Patient has no known allergies.   Review of Systems Review of Systems  Unable to perform ROS: Other     Physical Exam Updated Vital Signs BP 130/73   Pulse 87   Resp 13   Ht  (1.549 m)   Wt 200 lb (90.7 kg)   SpO2 100%   BMI 37.79 kg/m    Physical Exam  Constitutional: He appears well-developed and well-nourished.  HENT:  Head: Normocephalic and atraumatic.  Eyes:  Horizontal nystagmus  Neck: Normal range of motion.  Cardiovascular: Normal rate, regular rhythm, normal heart sounds and intact distal pulses.   Pulmonary/Chest: Effort normal and breath sounds normal. No respiratory distress.  Abdominal: Soft. He exhibits no distension. There is no tenderness.  Musculoskeletal: Normal range of motion.  Neurological: He is alert.  Skin: Skin is warm and dry.  Psychiatric: He has a normal mood and affect. Judgment normal.  Nursing note and vitals reviewed.    ED Treatments / Results  Labs (all labs ordered are listed, but  only abnormal results are displayed) Labs Reviewed  VALPROIC ACID LEVEL    EKG  EKG Interpretation None       Radiology No results found.  Procedures Procedures (including critical care time)  Medications Ordered in ED Medications - No data to display   Initial Impression / Assessment and Plan / ED Course  I have reviewed the triage vital signs and the nursing notes.  Pertinent labs & imaging results that were available during my care of the patient were reviewed by me and considered in my medical decision making (see chart for details).       Father reports patient's horizontal nystagmus is baseline for him.  Depakote level stable.  Discharge home to follow-up with neurology as an outpatient.  Well-appearing.  Returned to baseline.   Final Clinical Impressions(s) / ED Diagnoses   Final diagnoses:  Seizure [facility])    New Prescriptions New Prescriptions   No medications on file     Azalia Bilis, MD 09/16/16 1514

## 2016-09-21 DIAGNOSIS — G40919 Epilepsy, unspecified, intractable, without status epilepticus: Secondary | ICD-10-CM | POA: Diagnosis not present

## 2016-09-21 DIAGNOSIS — S52032D Displaced fracture of olecranon process with intraarticular extension of left ulna, subsequent encounter for closed fracture with routine healing: Secondary | ICD-10-CM | POA: Diagnosis not present

## 2016-09-21 DIAGNOSIS — S52023D Displaced fracture of olecranon process without intraarticular extension of unspecified ulna, subsequent encounter for closed fracture with routine healing: Secondary | ICD-10-CM | POA: Diagnosis not present

## 2016-10-05 DIAGNOSIS — K219 Gastro-esophageal reflux disease without esophagitis: Secondary | ICD-10-CM | POA: Diagnosis not present

## 2016-10-05 DIAGNOSIS — Q909 Down syndrome, unspecified: Secondary | ICD-10-CM | POA: Diagnosis not present

## 2016-10-05 DIAGNOSIS — G40919 Epilepsy, unspecified, intractable, without status epilepticus: Secondary | ICD-10-CM | POA: Diagnosis not present

## 2016-10-28 ENCOUNTER — Encounter: Payer: Self-pay | Admitting: Neurology

## 2016-10-28 ENCOUNTER — Ambulatory Visit (INDEPENDENT_AMBULATORY_CARE_PROVIDER_SITE_OTHER): Payer: Medicare Other | Admitting: Neurology

## 2016-10-28 VITALS — BP 132/79 | HR 81 | Ht 61.0 in | Wt 182.0 lb

## 2016-10-28 DIAGNOSIS — G40909 Epilepsy, unspecified, not intractable, without status epilepticus: Secondary | ICD-10-CM | POA: Diagnosis not present

## 2016-10-28 DIAGNOSIS — Z5181 Encounter for therapeutic drug level monitoring: Secondary | ICD-10-CM | POA: Diagnosis not present

## 2016-10-28 DIAGNOSIS — Q909 Down syndrome, unspecified: Secondary | ICD-10-CM | POA: Diagnosis not present

## 2016-10-28 HISTORY — DX: Epilepsy, unspecified, not intractable, without status epilepticus: G40.909

## 2016-10-28 MED ORDER — LEVETIRACETAM 500 MG PO TABS: 500.0000 mg | ORAL_TABLET | Freq: Two times a day (BID) | ORAL | 3 refills | Status: DC

## 2016-10-28 NOTE — Patient Instructions (Addendum)
    We will start Keppra 500 mg tablet take 1/2 tablet twice a day for 2 weeks, then take 1 tablet twice a day. We will get a CT of the head.

## 2016-10-28 NOTE — Progress Notes (Signed)
Reason for visit: Seizures  Referring physician: [place]  [name] is a 51 y.o. male  History of present illness:  [name] is a 51 year old right-handed white male with a history of Down syndrome with significant cognitive disability. The patient has had seizure problems for several decades, he currently is on 500 mg of Depakote in the morning and 750 mg in the evening. The patient has been well controlled with the seizures for a number of years, but the patient has suffered 2 seizures this year, one in January 2018 and another in April 2018. The patient has had several falls, he is somewhat clumsy with walking. Over the last several years he has had a decline in his verbal output, and he has had more falling episodes. Over the last 6-7 years he will seem to talk to someone who is not present in the room. He does not have significant issues with agitation. He has been on CPAP over the last 5 years and he is sleeping better on the CPAP. The patient has begun to have more frequent episodes of urinary incontinence. The patient lives in a group home, [facility]. The patient recently had blood levels drawn in the emergency room with a level of 94. He is sent to this office for further evaluation. He has not had a CT scan of the brain since 2011.   Past Medical History:  Diagnosis Date  . Down's syndrome   . Hypertension   . Hypothyroid   . Lives in group home    [facility]-[phone]-fax-[phone]-Roxanne-nurse  . Nystagmus   . Obstructive sleep apnea (adult) (pediatric)    does use a cpap at night  . Seizure disorder (HCC) 10/28/2016  . Seizures (HCC)     Past Surgical History:  Procedure Laterality Date  . IRRIGATION AND DEBRIDEMENT  07/01/2014      . ORIF HUMERUS FRACTURE Left 02/05/2013   Procedure: OPEN REDUCTION INTERNAL FIXATION (ORIF) MEDIAL HUMERUS CONDYLE FRACTURE;  Surgeon: Jodi Marble, MD;  Location: [facility];  Service:  Orthopedics;  Laterality: Left;  . Right Elbow surgery      Family History  Problem Relation Age of Onset  . Dementia Maternal Grandmother   . Diabetes Paternal Grandfather   . Heart failure Paternal Grandfather   . Hypertension Paternal Grandfather     Social history:  reports that he has never smoked. He has never used smokeless tobacco. He reports that he does not drink alcohol or use drugs.  Medications:  Prior to Admission medications   Medication Sig Start Date End Date Taking? Authorizing Provider  aspirin 81 MG tablet Take 81 mg by mouth daily.   Yes [provider]  benazepril (LOTENSIN) 10 MG tablet Take 10 mg by mouth daily.   Yes [provider]  calcitonin, salmon, (MIACALCIN/FORTICAL) 200 UNIT/ACT nasal spray Place 1 spray into alternate nostrils daily.   Yes [provider]  divalproex (DEPAKOTE) 250 MG DR tablet Take 500-750 mg by mouth See admin instructions. Takes two tablets (500mg ) in the am and three tablets (750mg ) in the evening. Do not crush   Yes [provider]  famotidine (PEPCID) 20 MG tablet Take 20 mg by mouth at bedtime.    Yes [provider]  Infant Care Products (JOHNSONS BABY SHAMPOO) SHAM 1/2 H2O and 1/2 shampoo-scrub eyebrows qd   Yes [provider]  levothyroxine (SYNTHROID, LEVOTHROID) 100 MCG tablet Take 100 mcg by mouth daily before  breakfast.   Yes [provider]  SUNSCREEN SPF30 EX Apply 1 application topically daily as needed (prevent sunburn).   Yes [provider]  tolnaftate (TINACTIN) 1 % powder Apply between toes and both feet nightly   Yes [provider]  Vitamins A & D (VITAMIN A & D) ointment Apply to both cheeks every morning and each night prior to placing CPAP mask on   Yes [provider]     No Known Allergies  ROS:  Out of a complete 14 system review of symptoms, the patient complains only of the following symptoms, and all other  reviewed systems are negative.  Snoring Easy bruising Seizures, tremor  Blood pressure 132/79, pulse 81, height 5\' 1"  (1.549 m), weight 182 lb (82.6 kg).  Physical Exam  General: The patient is alert and cooperative at the time of the examination. the patient is moderately obese.  Eyes: Pupils are equal, round, and reactive to light. Discs were difficult to see, the patient has congenital nystagmus.  Neck: The neck is supple, no carotid bruits are noted.  Respiratory: The respiratory examination is clear.  Cardiovascular: The cardiovascular examination reveals a regular rate and rhythm, no obvious murmurs or rubs are noted.  Skin: Extremities are without significant edema.  Neurologic Exam  Mental status: The patient is alert, he does not follow commands well. He is minimally verbal, occasionally he will say "yes".  Cranial nerves: Facial symmetry is present. There is good sensation of the face to pinprick and soft touch bilaterally. The strength of the facial muscles and the muscles to head turning and shoulder shrug are normal bilaterally. Speech is limited.Marland Kitchen. Extraocular movements are full. Visual fields are full to threat . The tongue is midline, and the patient has symmetric elevation of the soft palate. No obvious hearing deficits are noted.  Motor: The motor testing reveals 5 over 5 strength of all 4 extremities, the patient does not cooperate well for direct motor testing but appears to use the arms and legs normally. Good symmetric motor tone is noted throughout.  Sensory: Sensory testing is difficult, the patient has minimal response to deep pain stimulation on all 4 extremities, he will respond when scratching the feet bilaterally in a symmetric fashion  Coordination: Cerebellar testing is difficult, the patient will not follow commands. With arms outstretched, a tremors noted on the right hand.  Gait and station: Gait is slightly wide-based, the patient can walk  independently. Tandem gait was not tested. Romberg is negative.  Reflexes: Deep tendon reflexes are symmetric and normal bilaterally. Toes are downgoing bilaterally.   Assessment/Plan:   1. Down syndrome  2. Seizures  The patient has had worsening seizure control with adequate levels of Depakote. The patient will undergo blood work to also include an ammonia level with Depakote therapy. Keppra will be added in low dose, they will look out for drowsiness or irritability. The patient will follow-up in 3 months. A CT scan of the brain will be done as the patient has had multiple falls associated with trauma and his seizures have become less well controlled. The patient will start Keppra at 250 mg twice daily for 2 weeks and then go to 500 mg twice daily.  Marlan Palau. Keith Dreyah Montrose MD 10/28/2016 9:41 AM  [facility] [address] [place] [id number]  Phone [phone] Fax [phone]

## 2016-10-29 ENCOUNTER — Telehealth: Payer: Self-pay | Admitting: Neurology

## 2016-10-29 LAB — COMPREHENSIVE METABOLIC PANEL
ALBUMIN: 3.6 g/dL (ref 3.5–5.5)
ALT: 8 IU/L (ref 0–44)
AST: 15 IU/L (ref 0–40)
Albumin/Globulin Ratio: 1.3 (ref 1.2–2.2)
Alkaline Phosphatase: 60 IU/L (ref 39–117)
BUN / CREAT RATIO: 18 (ref 9–20)
BUN: 14 mg/dL (ref 6–24)
Bilirubin Total: 0.6 mg/dL (ref 0.0–1.2)
CALCIUM: 9.1 mg/dL (ref 8.7–10.2)
CO2: 27 mmol/L (ref 18–29)
CREATININE: 0.76 mg/dL (ref 0.76–1.27)
Chloride: 92 mmol/L — ABNORMAL LOW (ref 96–106)
GFR, EST AFRICAN AMERICAN: 124 mL/min/{1.73_m2} (ref >59)
GFR, EST NON AFRICAN AMERICAN: 107 mL/min/{1.73_m2} (ref >59)
GLOBULIN, TOTAL: 2.8 g/dL (ref 1.5–4.5)
Glucose: 104 mg/dL — ABNORMAL HIGH (ref 65–99)
Potassium: 4.3 mmol/L (ref 3.5–5.2)
SODIUM: 129 mmol/L — AB (ref 134–144)
Total Protein: 6.4 g/dL (ref 6.0–8.5)

## 2016-10-29 LAB — CBC WITH DIFFERENTIAL/PLATELET
Basophils Absolute: 0 10*3/uL (ref 0.0–0.2)
Basos: 0 %
EOS (ABSOLUTE): 0 10*3/uL (ref 0.0–0.4)
Eos: 0 %
HEMOGLOBIN: 13.5 g/dL (ref 13.0–17.7)
Hematocrit: 42.3 % (ref 37.5–51.0)
IMMATURE GRANS (ABS): 0 10*3/uL (ref 0.0–0.1)
Immature Granulocytes: 0 %
Lymphocytes Absolute: 1.5 10*3/uL (ref 0.7–3.1)
Lymphs: 29 %
MCH: 32.7 pg (ref 26.6–33.0)
MCHC: 31.9 g/dL (ref 31.5–35.7)
MCV: 102 fL — ABNORMAL HIGH (ref 79–97)
Monocytes Absolute: 0.6 10*3/uL (ref 0.1–0.9)
Monocytes: 11 %
NEUTROS PCT: 60 %
Neutrophils Absolute: 3 10*3/uL (ref 1.4–7.0)
PLATELETS: 206 10*3/uL (ref 150–379)
RBC: 4.13 x10E6/uL — AB (ref 4.14–5.80)
RDW: 15.4 % (ref 12.3–15.4)
WBC: 5.1 10*3/uL (ref 3.4–10.8)

## 2016-10-29 LAB — VALPROIC ACID LEVEL: VALPROIC ACID LVL: 96 ug/mL (ref 50–100)

## 2016-10-29 LAB — AMMONIA: AMMONIA: 111 ug/dL — AB (ref 27–102)

## 2016-10-29 MED ORDER — DIVALPROEX SODIUM 250 MG PO DR TAB: 500.0000 mg | DELAYED_RELEASE_TABLET | Freq: Two times a day (BID) | ORAL | Status: DC

## 2016-10-29 NOTE — Telephone Encounter (Signed)
I called the father. The blood work shows a low sodium level and an elevated ammonia level, we will reduce the VPA dose to 500 mg twice a day.

## 2016-11-02 DIAGNOSIS — G40919 Epilepsy, unspecified, intractable, without status epilepticus: Secondary | ICD-10-CM | POA: Diagnosis not present

## 2016-11-02 DIAGNOSIS — I10 Essential (primary) hypertension: Secondary | ICD-10-CM | POA: Diagnosis not present

## 2016-11-09 ENCOUNTER — Ambulatory Visit
Admission: RE | Admit: 2016-11-09 | Discharge: 2016-11-09 | Disposition: A | Discharge status: Home / Self Care | Payer: Medicare Other | Source: Ambulatory Visit | Attending: Neurology | Admitting: Neurology

## 2016-11-09 DIAGNOSIS — Q909 Down syndrome, unspecified: Secondary | ICD-10-CM | POA: Diagnosis not present

## 2016-11-09 DIAGNOSIS — G40919 Epilepsy, unspecified, intractable, without status epilepticus: Secondary | ICD-10-CM | POA: Diagnosis not present

## 2016-11-09 DIAGNOSIS — E039 Hypothyroidism, unspecified: Secondary | ICD-10-CM | POA: Diagnosis not present

## 2016-11-09 DIAGNOSIS — R569 Unspecified convulsions: Secondary | ICD-10-CM | POA: Diagnosis not present

## 2016-11-09 DIAGNOSIS — G40909 Epilepsy, unspecified, not intractable, without status epilepticus: Secondary | ICD-10-CM

## 2016-11-10 ENCOUNTER — Telehealth: Payer: Self-pay | Admitting: Neurology

## 2016-11-10 NOTE — Telephone Encounter (Signed)
CT the head is unchanged from 7 years ago. Mild cerebellar atrophy noted.   CT head 11/10/16:  IMPRESSION:   Mild cerebellar atrophy. No acute findings. No change from CT on 07/29/09.

## 2016-11-16 DIAGNOSIS — G40919 Epilepsy, unspecified, intractable, without status epilepticus: Secondary | ICD-10-CM | POA: Diagnosis not present

## 2016-11-16 DIAGNOSIS — F72 Severe intellectual disabilities: Secondary | ICD-10-CM | POA: Diagnosis not present

## 2016-11-16 DIAGNOSIS — I10 Essential (primary) hypertension: Secondary | ICD-10-CM | POA: Diagnosis not present

## 2016-12-14 DIAGNOSIS — E559 Vitamin D deficiency, unspecified: Secondary | ICD-10-CM | POA: Diagnosis not present

## 2016-12-14 DIAGNOSIS — I10 Essential (primary) hypertension: Secondary | ICD-10-CM | POA: Diagnosis not present

## 2016-12-14 DIAGNOSIS — E039 Hypothyroidism, unspecified: Secondary | ICD-10-CM | POA: Diagnosis not present

## 2016-12-14 DIAGNOSIS — E663 Overweight: Secondary | ICD-10-CM | POA: Diagnosis not present

## 2016-12-14 DIAGNOSIS — Q909 Down syndrome, unspecified: Secondary | ICD-10-CM | POA: Diagnosis not present

## 2016-12-22 ENCOUNTER — Telehealth: Payer: Self-pay | Admitting: Internal Medicine

## 2016-12-22 NOTE — Telephone Encounter (Signed)
Pt was contacted to inform him of CPAP machine needed during 7/26 appointment with CY; pt did not answer and voicemail was unable to be left. Pt mother was contacted; pt father answered and was made aware of machine being needed for appointment. He stated he will contact patient's group home to make them aware of information. Nothing further is needed.

## 2016-12-23 ENCOUNTER — Ambulatory Visit (INDEPENDENT_AMBULATORY_CARE_PROVIDER_SITE_OTHER): Payer: Medicare Other | Admitting: Internal Medicine

## 2016-12-23 ENCOUNTER — Telehealth: Payer: Self-pay | Admitting: Internal Medicine

## 2016-12-23 ENCOUNTER — Encounter: Payer: Self-pay | Admitting: Internal Medicine

## 2016-12-23 VITALS — BP 124/78 | HR 86 | Ht 63.0 in | Wt 180.0 lb

## 2016-12-23 DIAGNOSIS — G4733 Obstructive sleep apnea (adult) (pediatric): Secondary | ICD-10-CM

## 2016-12-23 DIAGNOSIS — Q909 Down syndrome, unspecified: Secondary | ICD-10-CM

## 2016-12-23 NOTE — Telephone Encounter (Signed)
Left message for Manuel LoserRhonda that we have placed order for a new CPAP and supplies as well as patient needs face mask asap. If any further questions or concerns please contact me directly at the office.

## 2016-12-23 NOTE — Progress Notes (Signed)
HPI male Down's Syndrome, followed for OSA, complicated by seizure disorder NPSG 07/15/06- AHI 55.7/ hr, desaturation to 94%, body weight 187 lbs, bruxism . Report is in Epic Notes.10/13/10   ------------------------------------------------------------------------------------------------------------------------------------------  08/22/14- 47 yoM Down's Syndrome, followed for OSA    Aide here  CPAP 10/ [facility] every night managed by staff at group home The aide for a lot of detail but indicates no reported problems with continued use of CPAP every night. [name] is nonverbal. A medical record note book is reviewed and returned.  09/15/2015-51 year old male Down's Syndrome, followed for OSA  Aide here CPAP 10/[facility] Follows for: OSA. Pt is reported to be wearing his CPAP machine nightly.  [name] is nonverbal. The aide from his care facility says that he sleeps well with CPAP and it is placed on him every night. She reports no problems.  12/23/16- 51 year old male Down's Syndrome, followed for OSA, complicated by seizure disorder CPAP 10/[facility] FOLLOWS FOR: DME [facility]. Unable to DL due to machine being too old; lives in group home. Here with an aide. Notes from his group home indicate that his CPAP machine/mask are broken and they need orders to his DME for replacement. They have been making sure he wears his CPAP every night. He has low functioning and not able to make any comment himself.  ROS-see HPI- as reported by staff assistant with him  + = pos Constitutional:   No-   weight loss, night sweats, fevers, chills, fatigue, lassitude. HEENT:   No-  headaches, difficulty swallowing, tooth/dental problems, sore throat,       No-  sneezing, itching, ear ache, nasal congestion, post nasal drip,  CV:  No-   chest pain, orthopnea, PND, swelling in lower extremities, anasarca, dizziness, palpitations Resp: No-   shortness of breath with exertion or at rest.              No-   productive  cough,  No non-productive cough,  No- coughing up of blood.              No-   change in color of mucus.  No- wheezing.   Skin: No-   rash or lesions. GI:  No-   heartburn, indigestion, abdominal pain, nausea, vomiting,  GU: No-   . MS:  No-   joint pain or swelling.  Neuro-     nothing unusual Psych:  No- change in mood or affect. No depression or anxiety.  No memory loss.  OBJ- Physical Exam General- Alert, Aware, Affect-pleasant, Distress- none acute, non-sensical.  Skin-  No breakdown or strap pressure marks. Lymphadenopathy- none Head- atraumatic            Eyes- Gross vision intact, PERRLA, conjunctivae and secretions clear            Ears- Hearing, grossly normal            Nose- Clear, no-Septal dev, mucus, polyps, erosion, perforation             Throat- Mallampati III-IV , mucosa clear , drainage- none, tonsils- atrophic Neck- flexible , trachea midline, no stridor , thyroid nl, carotid no bruit Chest - symmetrical excursion , unlabored           Heart/CV- RRR , no murmur , no gallop  , no rub, nl s1 s2                           - JVD- none , edema-  none, stasis changes- none, varices- none           Lung- clear to P&A, wheeze- none, cough- none , dullness-none, rub- none           Chest wall-  Abd-  Br/ Gen/ Rectal- Not done, not indicated Extrem-  Neuro- +severely retarded, nystagmus, gesturing w/ left hand. Few grunts. Does not seem to comprehend.

## 2016-12-23 NOTE — Assessment & Plan Note (Signed)
He is not able to speak for himself but seems to be well fed and cared for

## 2016-12-23 NOTE — Progress Notes (Signed)
[name]

## 2016-12-23 NOTE — Patient Instructions (Signed)
Order- DME [facility]- please replace old CPAP machine, change to auto 5-15, mask of choice, humidifier, supplies, AirView.   Dx OSA      Patient needs new mask now please.       Dx OSA  Please call as needed

## 2016-12-23 NOTE — Assessment & Plan Note (Signed)
He is due for a new machine to replace one that is broken. Compliance is good, enforced by group home staff. Plan-replacement for old CPAP machine, converting to AutoPap 5-15

## 2016-12-24 ENCOUNTER — Telehealth: Payer: Self-pay | Admitting: Internal Medicine

## 2016-12-24 NOTE — Telephone Encounter (Signed)
Called [facility] & left vm for [place].  [name] & told [facility] I would [facility] & let them know mask is needed but I don't know if they will have for them by tomorrow.  I asked her if someone from [facility] could go by & pick up or if a family member could pick it up.  She stated she would see what she could do once we can find out [facility].  Called [facility] back & spoke to [place].  He states they have not provided supplies in a long time to pt & isn't sure which kind of mask pt uses.  He is going to call [name] & see what can be done in the next 3 hours before office closes.  He states they will not need another order due to order we have calls for new cpap & supplies.  Nothing further needed.

## 2016-12-24 NOTE — Telephone Encounter (Signed)
[name] just called & states they do have mask in stock at [facility] location & someone from [facility] is on their way now to pick it up.  Nothing further needed.

## 2017-01-11 DIAGNOSIS — Q909 Down syndrome, unspecified: Secondary | ICD-10-CM | POA: Diagnosis not present

## 2017-01-11 DIAGNOSIS — G4733 Obstructive sleep apnea (adult) (pediatric): Secondary | ICD-10-CM | POA: Diagnosis not present

## 2017-01-11 DIAGNOSIS — G40919 Epilepsy, unspecified, intractable, without status epilepticus: Secondary | ICD-10-CM | POA: Diagnosis not present

## 2017-02-02 ENCOUNTER — Ambulatory Visit: Payer: Medicare Other | Admitting: Adult Health

## 2017-02-08 DIAGNOSIS — Q909 Down syndrome, unspecified: Secondary | ICD-10-CM | POA: Diagnosis not present

## 2017-02-08 DIAGNOSIS — E039 Hypothyroidism, unspecified: Secondary | ICD-10-CM | POA: Diagnosis not present

## 2017-02-08 DIAGNOSIS — G40919 Epilepsy, unspecified, intractable, without status epilepticus: Secondary | ICD-10-CM | POA: Diagnosis not present

## 2017-03-01 ENCOUNTER — Encounter: Payer: Self-pay | Admitting: Adult Health

## 2017-03-01 ENCOUNTER — Ambulatory Visit (INDEPENDENT_AMBULATORY_CARE_PROVIDER_SITE_OTHER): Payer: Medicare Other | Admitting: Adult Health

## 2017-03-01 VITALS — BP 120/72 | HR 88 | Ht 63.0 in | Wt 184.4 lb

## 2017-03-01 DIAGNOSIS — Z5181 Encounter for therapeutic drug level monitoring: Secondary | ICD-10-CM

## 2017-03-01 DIAGNOSIS — Q909 Down syndrome, unspecified: Secondary | ICD-10-CM

## 2017-03-01 DIAGNOSIS — R569 Unspecified convulsions: Secondary | ICD-10-CM

## 2017-03-01 NOTE — Patient Instructions (Signed)
Your Plan:  Continue Depakote 500 mg twice a day Keppra 250 mg twice a day Blood work today If your symptoms worsen or you develop new symptoms please let us know.   Thank you for coming to see [place] at [facility]. I hope we have been able to provide you high quality care today.  You may receive a patient satisfaction survey over the next few weeks. We would appreciate your feedback and comments so that we may continue to improve ourselves and the health of our patients.

## 2017-03-01 NOTE — Progress Notes (Signed)
I have read the note, and I agree with the clinical assessment and plan.  Haji Delaine KEITH   

## 2017-03-01 NOTE — Progress Notes (Signed)
PATIENT: [name] DOB: 04/20/1966  REASON FOR VISIT: follow up- down syndrome, seizures HISTORY FROM: patient  HISTORY OF PRESENT ILLNESS: Today 03/01/17 [name] is a 51 year old male with a history of Down syndrome and seizures. He returns today for follow-up. He is currently on Depakote 500 mg twice a day. He also takes Keppra 250 mg twice a day. He denies any seizure activity. He is here today with his dad and caregiver. Caregiver reports that he has not been told that the patient has had any seizure activity. Patient seems to be tolerating Keppra well. The patient was originally supposed to increase Keppra to 500 mg twice a day however this did not occur. Patient lives at a group home. They deny any new neurological conditions. Returns today for an evaluation.  HISTORY 10/28/16: [name] is a 51 year old right-handed white male with a history of Down syndrome with significant cognitive disability. The patient has had seizure problems for several decades, he currently is on 500 mg of Depakote in the morning and 750 mg in the evening. The patient has been well controlled with the seizures for a number of years, but the patient has suffered 2 seizures this year, one in January 2018 and another in April 2018. The patient has had several falls, he is somewhat clumsy with walking. Over the last several years he has had a decline in his verbal output, and he has had more falling episodes. Over the last 6-7 years he will seem to talk to someone who is not present in the room. He does not have significant issues with agitation. He has been on CPAP over the last 5 years and he is sleeping better on the CPAP. The patient has begun to have more frequent episodes of urinary incontinence. The patient lives in a group home, [facility]. The patient recently had blood levels drawn in the emergency room with a level of 94. He is sent to this office for further evaluation. He has not had  a CT scan of the brain since 2011.   REVIEW OF SYSTEMS: Out of a complete 14 system review of symptoms, the patient complains only of the following symptoms, and all other reviewed systems are negative.  See HPI  ALLERGIES: No Known Allergies  HOME MEDICATIONS: Outpatient Medications Prior to Visit  Medication Sig Dispense Refill  . aspirin 81 MG tablet Take 81 mg by mouth daily.    . benazepril (LOTENSIN) 10 MG tablet Take 10 mg by mouth daily.    . calcitonin, salmon, (MIACALCIN/FORTICAL) 200 UNIT/ACT nasal spray Place 1 spray into alternate nostrils daily.    . cholecalciferol (VITAMIN D) 1000 units tablet Take 2,000 Units by mouth daily.    . divalproex (DEPAKOTE) 250 MG DR tablet Take 2 tablets (500 mg total) by mouth 2 (two) times daily.    [employer] Care Products (JOHNSONS BABY SHAMPOO) SHAM 1/2 H2O and 1/2 shampoo-scrub eyebrows qd    . levETIRAcetam (KEPPRA) 500 MG tablet Take 1 tablet (500 mg total) by mouth 2 (two) times daily. 60 tablet 3  . levothyroxine (SYNTHROID, LEVOTHROID) 100 MCG tablet Take 100 mcg by mouth daily before breakfast.    . SUNSCREEN SPF30 EX Apply 1 application topically daily as needed (prevent sunburn).    . tolnaftate (TINACTIN) 1 % powder Apply between toes and both feet nightly    . Vitamins A & D (VITAMIN A & D) ointment Apply to both cheeks every morning and each night  prior to placing CPAP mask on     No facility-administered medications prior to visit.     PAST MEDICAL HISTORY: Past Medical History:  Diagnosis Date  . Down's syndrome   . Hypertension   . Hypothyroid   . Lives in group home    [facility]-[phone]-fax-[phone]-Roxanne-nurse  . Nystagmus   . Obstructive sleep apnea (adult) (pediatric)    does use a cpap at night  . Seizure disorder (HCC) 10/28/2016  . Seizures (HCC)     PAST SURGICAL HISTORY: Past Surgical History:  Procedure Laterality Date  . IRRIGATION AND DEBRIDEMENT  07/01/2014      . ORIF HUMERUS FRACTURE  Left 02/05/2013   Procedure: OPEN REDUCTION INTERNAL FIXATION (ORIF) MEDIAL HUMERUS CONDYLE FRACTURE;  Surgeon: Jodi Marble, MD;  Location: [facility];  Service: Orthopedics;  Laterality: Left;  . Right Elbow surgery      FAMILY HISTORY: Family History  Problem Relation Age of Onset  . Dementia Maternal Grandmother   . Diabetes Paternal Grandfather   . Heart failure Paternal Grandfather   . Hypertension Paternal Grandfather     SOCIAL HISTORY: Social History   Social History  . Marital status: Single    Spouse name: N/A  . Number of children: 0  . 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 Smoking status: Never Smoker  . Smokeless tobacco: Never Used  . Alcohol use No  . Drug use: No  . Sexual activity: Not on file   Other Topics Concern  . Not on file   Social History Narrative   Lives at a group home   Caffeine use: Tea/soda once per week   Right handed      PHYSICAL EXAM  Vitals:   03/01/17 1420  BP: 120/72  Pulse: 88  Weight: 184 lb 6.4 oz (83.6 kg)  Height:  (1.6 m)   Body mass index is 32.66 kg/m.  Generalized: Well developed, in no acute distress   Neurological examination  Mentation: Alert. Follows commands intermittently. Cranial nerve II-XII: Nystagmus noted with horizontal gaze. Facial symmetry present. Motor: The motor testing reveals 5 over 5 strength of all 4 extremities. Good symmetric motor tone is noted throughout.  Sensory: Sensory testing is intact to soft touch on all 4 extremities. No evidence of extinction is noted.  Coordination: Unable to complete Gait and station: Gait is slightly wide-based. Tandem gait not attempted.     DIAGNOSTIC DATA (LABS, IMAGING, TESTING) - I reviewed patient records, labs, notes, testing and imaging myself where available.  Lab Results  Component Value Date   WBC 5.1 10/28/2016   HGB 13.5 10/28/2016   HCT 42.3 10/28/2016     MCV 102 (H) 10/28/2016   PLT 206 10/28/2016      Component Value Date/Time   NA 129 (L) 10/28/2016 1016   K 4.3 10/28/2016 1016   CL 92 (L) 10/28/2016 1016   CO2 27 10/28/2016 1016   GLUCOSE 104 (H) 10/28/2016 1016   GLUCOSE 100 (H) 09/23/2015 1806   BUN 14 10/28/2016 1016   CREATININE 0.76 10/28/2016 1016   CALCIUM 9.1 10/28/2016 1016   PROT 6.4 10/28/2016 1016   ALBUMIN 3.6 10/28/2016 1016   AST 15 10/28/2016 1016   ALT 8 10/28/2016 1016   ALKPHOS 60 10/28/2016 1016   BILITOT 0.6 10/28/2016 1016   GFRNONAA 107 10/28/2016 1016   GFRAA 124 10/28/2016 1016  ASSESSMENT AND PLAN 51 y.o. year old male  has a past medical history of Down's syndrome; Hypertension; Hypothyroid; Lives in group home; Nystagmus; Obstructive sleep apnea (adult) (pediatric); Seizure disorder (HCC) (10/28/2016); and Seizures (HCC). here with:  1. Seizures 2. history of Down syndrome  Overall the patient is stable. He will continue on Depakote 500 mg twice a day and Keppra 250 mg twice a day. I will recheck blood work today. I did advise that we could potentially increase Keppra to 500 mg twice a day as originally instructed however the patient's father does not feel this is necessary since he's been stable on 250 mg twice a day. I am amenable to this plan. He will follow-up in 6 months or sooner if needed.   [name], MSN, NP-C 03/01/2017, 2:41 PM [facility] [address], [address] [place], [place] [address] [phone]

## 2017-03-02 LAB — COMPREHENSIVE METABOLIC PANEL
A/G RATIO: 1.3 (ref 1.2–2.2)
ALT: 10 IU/L (ref 0–44)
AST: 18 IU/L (ref 0–40)
Albumin: 3.8 g/dL (ref 3.5–5.5)
Alkaline Phosphatase: 61 IU/L (ref 39–117)
BUN / CREAT RATIO: 17 (ref 9–20)
BUN: 13 mg/dL (ref 6–24)
Bilirubin Total: 0.2 mg/dL (ref 0.0–1.2)
CALCIUM: 9 mg/dL (ref 8.7–10.2)
CO2: 28 mmol/L (ref 20–29)
CREATININE: 0.78 mg/dL (ref 0.76–1.27)
Chloride: 100 mmol/L (ref 96–106)
GFR, EST AFRICAN AMERICAN: 123 mL/min/{1.73_m2} (ref >59)
GFR, EST NON AFRICAN AMERICAN: 106 mL/min/{1.73_m2} (ref >59)
GLOBULIN, TOTAL: 3 g/dL (ref 1.5–4.5)
Glucose: 104 mg/dL — ABNORMAL HIGH (ref 65–99)
POTASSIUM: 4.5 mmol/L (ref 3.5–5.2)
SODIUM: 140 mmol/L (ref 134–144)
TOTAL PROTEIN: 6.8 g/dL (ref 6.0–8.5)

## 2017-03-02 LAB — CBC WITH DIFFERENTIAL/PLATELET
BASOS: 1 %
Basophils Absolute: 0.1 10*3/uL (ref 0.0–0.2)
EOS (ABSOLUTE): 0 10*3/uL (ref 0.0–0.4)
Eos: 1 %
Hematocrit: 39.7 % (ref 37.5–51.0)
Hemoglobin: 13.6 g/dL (ref 13.0–17.7)
IMMATURE GRANS (ABS): 0 10*3/uL (ref 0.0–0.1)
Immature Granulocytes: 0 %
LYMPHS ABS: 2 10*3/uL (ref 0.7–3.1)
Lymphs: 38 %
MCH: 33.6 pg — AB (ref 26.6–33.0)
MCHC: 34.3 g/dL (ref 31.5–35.7)
MCV: 98 fL — AB (ref 79–97)
MONOS ABS: 0.5 10*3/uL (ref 0.1–0.9)
Monocytes: 9 %
NEUTROS ABS: 2.7 10*3/uL (ref 1.4–7.0)
NEUTROS PCT: 51 %
PLATELETS: 265 10*3/uL (ref 150–379)
RBC: 4.05 x10E6/uL — ABNORMAL LOW (ref 4.14–5.80)
RDW: 14.9 % (ref 12.3–15.4)
WBC: 5.3 10*3/uL (ref 3.4–10.8)

## 2017-03-02 LAB — VALPROIC ACID LEVEL: Valproic Acid Lvl: 69 ug/mL (ref 50–100)

## 2017-03-02 LAB — AMMONIA: Ammonia: 127 ug/dL (ref 27–102)

## 2017-03-03 ENCOUNTER — Telehealth: Payer: Self-pay | Admitting: Adult Health

## 2017-03-03 MED ORDER — LEVETIRACETAM 500 MG PO TABS: 500.0000 mg | ORAL_TABLET | Freq: Two times a day (BID) | ORAL | 5 refills | Status: DC

## 2017-03-03 MED ORDER — DIVALPROEX SODIUM 250 MG PO DR TAB: 250.0000 mg | DELAYED_RELEASE_TABLET | Freq: Two times a day (BID) | ORAL | 5 refills | Status: DC

## 2017-03-03 NOTE — Telephone Encounter (Signed)
RN Bjorn Loser has called you back stating she was in a meeting earlier.  Please return the call to [phone]

## 2017-03-03 NOTE — Telephone Encounter (Signed)
Please advise nurse to decrease Depakote 250 mg twice a day. Increase Keppra 500 mg twice a day. We will call the patient in two weeks and may repeat blood work. If he has any seizures please let us know.

## 2017-03-03 NOTE — Telephone Encounter (Signed)
Attempted to reach [place] RN. LVM requesting she call back for medication changes for patient. Gave her office hours.

## 2017-03-03 NOTE — Telephone Encounter (Signed)
I called the patient's mother and left a message for her to call our office. [name] Depakote level was normal however the ammonia level has increased again. We will need to adjust his medications. I will try to call again later this afternoon

## 2017-03-03 NOTE — Telephone Encounter (Signed)
[name] returned RN's call.  Fax# [phone]

## 2017-03-03 NOTE — Telephone Encounter (Signed)
Called back [employer] and requested fax number to send new prescriptions. Received number; will fax to attention Judeen Hammans, RN.

## 2017-03-03 NOTE — Addendum Note (Signed)
Addended by: Enedina Finner on: 03/03/2017 03:45 PM   Modules accepted: Orders

## 2017-03-03 NOTE — Telephone Encounter (Signed)
[name] mom called back. I discussed lab results with her. Explained that his ammonia level has increased again. We do need to decrease the Depakote and potentially wean him off of this in the future. We would like him to  250 mg twice a day of Depakote and increase his Keppra to 500 mg twice a day. She is advised that I would need to call the group home to make them aware of these changes.  I Called [facility] [phone]. I left a message with Bjorn Loser the nursing supervisor and advised that she should call our office so I can make adjustments to his medication. If Bjorn Loser has not returned my call by this afternoon I will try again.

## 2017-03-04 NOTE — Telephone Encounter (Signed)
Called Bjorn Loser to confirm receipt of Rx fax due to not getting confirmation on this end. Bjorn Loser stated she has Rx on her desk, and the doctor is at facility today. She will bring new Rx x 2 to his attention for the patient. She verbalized understanding of call, appreciation.

## 2017-03-16 DIAGNOSIS — Z23 Encounter for immunization: Secondary | ICD-10-CM | POA: Diagnosis not present

## 2017-03-22 DIAGNOSIS — K219 Gastro-esophageal reflux disease without esophagitis: Secondary | ICD-10-CM | POA: Diagnosis not present

## 2017-03-22 DIAGNOSIS — G40919 Epilepsy, unspecified, intractable, without status epilepticus: Secondary | ICD-10-CM | POA: Diagnosis not present

## 2017-03-22 DIAGNOSIS — M816 Localized osteoporosis [Lequesne]: Secondary | ICD-10-CM | POA: Diagnosis not present

## 2017-03-22 DIAGNOSIS — R269 Unspecified abnormalities of gait and mobility: Secondary | ICD-10-CM | POA: Diagnosis not present

## 2017-03-25 DIAGNOSIS — Z79899 Other long term (current) drug therapy: Secondary | ICD-10-CM | POA: Diagnosis not present

## 2017-04-08 ENCOUNTER — Telehealth: Payer: Self-pay | Admitting: Neurology

## 2017-04-08 NOTE — Telephone Encounter (Signed)
I called his adult home, [facility].  The Depakote level is low at 18, the dosing was decreased from 500 mg twice daily to 250 mg twice daily.  The reason for this was that the ammonia level was elevated, the patient was somewhat lethargic.  He seems to have improved with his mental status on the lower dose.  The Depakote level today was 18.

## 2017-04-12 DIAGNOSIS — Q909 Down syndrome, unspecified: Secondary | ICD-10-CM | POA: Diagnosis not present

## 2017-04-12 DIAGNOSIS — Z6833 Body mass index (BMI) 33.0-33.9, adult: Secondary | ICD-10-CM | POA: Diagnosis not present

## 2017-04-12 DIAGNOSIS — G40919 Epilepsy, unspecified, intractable, without status epilepticus: Secondary | ICD-10-CM | POA: Diagnosis not present

## 2017-05-23 DIAGNOSIS — G40919 Epilepsy, unspecified, intractable, without status epilepticus: Secondary | ICD-10-CM | POA: Diagnosis not present

## 2017-05-23 DIAGNOSIS — Z7982 Long term (current) use of aspirin: Secondary | ICD-10-CM | POA: Diagnosis not present

## 2017-05-23 DIAGNOSIS — Q909 Down syndrome, unspecified: Secondary | ICD-10-CM | POA: Diagnosis not present

## 2017-05-23 DIAGNOSIS — E039 Hypothyroidism, unspecified: Secondary | ICD-10-CM | POA: Diagnosis not present

## 2017-05-23 DIAGNOSIS — Z6833 Body mass index (BMI) 33.0-33.9, adult: Secondary | ICD-10-CM | POA: Diagnosis not present

## 2017-05-23 DIAGNOSIS — Z298 Encounter for other specified prophylactic measures: Secondary | ICD-10-CM | POA: Diagnosis not present

## 2017-05-27 DIAGNOSIS — E039 Hypothyroidism, unspecified: Secondary | ICD-10-CM | POA: Diagnosis not present

## 2017-05-27 DIAGNOSIS — E785 Hyperlipidemia, unspecified: Secondary | ICD-10-CM | POA: Diagnosis not present

## 2017-05-27 DIAGNOSIS — Z79899 Other long term (current) drug therapy: Secondary | ICD-10-CM | POA: Diagnosis not present

## 2017-06-02 DIAGNOSIS — F72 Severe intellectual disabilities: Secondary | ICD-10-CM | POA: Diagnosis not present

## 2017-06-02 DIAGNOSIS — E663 Overweight: Secondary | ICD-10-CM | POA: Diagnosis not present

## 2017-06-02 DIAGNOSIS — Q909 Down syndrome, unspecified: Secondary | ICD-10-CM | POA: Diagnosis not present

## 2017-06-02 DIAGNOSIS — G40919 Epilepsy, unspecified, intractable, without status epilepticus: Secondary | ICD-10-CM | POA: Diagnosis not present

## 2017-06-02 DIAGNOSIS — E039 Hypothyroidism, unspecified: Secondary | ICD-10-CM | POA: Diagnosis not present

## 2017-06-02 DIAGNOSIS — E785 Hyperlipidemia, unspecified: Secondary | ICD-10-CM | POA: Diagnosis not present

## 2017-06-07 DIAGNOSIS — R635 Abnormal weight gain: Secondary | ICD-10-CM | POA: Diagnosis not present

## 2017-06-07 DIAGNOSIS — G40919 Epilepsy, unspecified, intractable, without status epilepticus: Secondary | ICD-10-CM | POA: Diagnosis not present

## 2017-06-07 DIAGNOSIS — Z6833 Body mass index (BMI) 33.0-33.9, adult: Secondary | ICD-10-CM | POA: Diagnosis not present

## 2017-06-07 DIAGNOSIS — K219 Gastro-esophageal reflux disease without esophagitis: Secondary | ICD-10-CM | POA: Diagnosis not present

## 2017-06-14 DIAGNOSIS — K5900 Constipation, unspecified: Secondary | ICD-10-CM | POA: Diagnosis not present

## 2017-06-14 DIAGNOSIS — F72 Severe intellectual disabilities: Secondary | ICD-10-CM | POA: Diagnosis not present

## 2017-06-14 DIAGNOSIS — I10 Essential (primary) hypertension: Secondary | ICD-10-CM | POA: Diagnosis not present

## 2017-06-14 DIAGNOSIS — G40909 Epilepsy, unspecified, not intractable, without status epilepticus: Secondary | ICD-10-CM | POA: Diagnosis not present

## 2017-07-13 DIAGNOSIS — G40919 Epilepsy, unspecified, intractable, without status epilepticus: Secondary | ICD-10-CM | POA: Diagnosis not present

## 2017-07-13 DIAGNOSIS — E039 Hypothyroidism, unspecified: Secondary | ICD-10-CM | POA: Diagnosis not present

## 2017-07-13 DIAGNOSIS — Q909 Down syndrome, unspecified: Secondary | ICD-10-CM | POA: Diagnosis not present

## 2017-08-02 DIAGNOSIS — Q909 Down syndrome, unspecified: Secondary | ICD-10-CM | POA: Diagnosis not present

## 2017-08-02 DIAGNOSIS — E039 Hypothyroidism, unspecified: Secondary | ICD-10-CM | POA: Diagnosis not present

## 2017-08-02 DIAGNOSIS — G40919 Epilepsy, unspecified, intractable, without status epilepticus: Secondary | ICD-10-CM | POA: Diagnosis not present

## 2017-08-03 ENCOUNTER — Other Ambulatory Visit: Payer: Self-pay

## 2017-08-03 ENCOUNTER — Emergency Department (HOSPITAL_COMMUNITY)
Admission: EM | Admit: 2017-08-03 | Discharge: 2017-08-03 | Disposition: A | Discharge status: Home / Self Care | Payer: Medicare Other | Attending: Emergency Medicine | Admitting: Emergency Medicine

## 2017-08-03 ENCOUNTER — Emergency Department (HOSPITAL_COMMUNITY): Payer: Medicare Other

## 2017-08-03 ENCOUNTER — Encounter (HOSPITAL_COMMUNITY): Payer: Self-pay | Admitting: *Deleted

## 2017-08-03 DIAGNOSIS — Z79899 Other long term (current) drug therapy: Secondary | ICD-10-CM | POA: Insufficient documentation

## 2017-08-03 DIAGNOSIS — Y939 Activity, unspecified: Secondary | ICD-10-CM | POA: Insufficient documentation

## 2017-08-03 DIAGNOSIS — X58XXXA Exposure to other specified factors, initial encounter: Secondary | ICD-10-CM | POA: Insufficient documentation

## 2017-08-03 DIAGNOSIS — Y929 Unspecified place or not applicable: Secondary | ICD-10-CM | POA: Diagnosis not present

## 2017-08-03 DIAGNOSIS — I10 Essential (primary) hypertension: Secondary | ICD-10-CM | POA: Insufficient documentation

## 2017-08-03 DIAGNOSIS — Y999 Unspecified external cause status: Secondary | ICD-10-CM | POA: Diagnosis not present

## 2017-08-03 DIAGNOSIS — Z7982 Long term (current) use of aspirin: Secondary | ICD-10-CM | POA: Insufficient documentation

## 2017-08-03 DIAGNOSIS — S62634A Displaced fracture of distal phalanx of right ring finger, initial encounter for closed fracture: Secondary | ICD-10-CM | POA: Diagnosis not present

## 2017-08-03 DIAGNOSIS — S6991XA Unspecified injury of right wrist, hand and finger(s), initial encounter: Secondary | ICD-10-CM | POA: Diagnosis present

## 2017-08-03 DIAGNOSIS — S62664A Nondisplaced fracture of distal phalanx of right ring finger, initial encounter for closed fracture: Secondary | ICD-10-CM | POA: Insufficient documentation

## 2017-08-03 DIAGNOSIS — E039 Hypothyroidism, unspecified: Secondary | ICD-10-CM | POA: Diagnosis not present

## 2017-08-03 HISTORY — DX: Gastro-esophageal reflux disease without esophagitis: K21.9

## 2017-08-03 NOTE — ED Provider Notes (Signed)
[facility]   CSN: [id number] Arrival date & time: 08/03/17  1015     History   Chief Complaint Chief Complaint  Patient presents with  . Finger Injury    HPI [name] is a 52 y.o. male with a past medical history of Down syndrome, hypertension, seizure disorder, who presents to ED for evaluation of right index finger injury.  History is limited due to patient's Down syndrome and is mostly provided by his caregiver.  His caregiver states that they noticed his right index finger bruised and swollen this morning.  He is unable to tell me what the injury was or when it occurred.  He tells me that patient has a high tolerance for pain so not have any complaints.  Denies any previous fracture, dislocations or procedures in the area.  HPI  Past Medical History:  Diagnosis Date  . Down's syndrome   . GERD (gastroesophageal reflux disease)   . Hypertension   . Hypothyroid   . Lives in group home    [facility]-[phone]-fax-[phone]-Roxanne-nurse  . Nystagmus   . Obstructive sleep apnea (adult) (pediatric)    does use a cpap at night  . Seizure disorder (HCC) 10/28/2016  . Seizures [facility])     Patient Active Problem List   Diagnosis Date Noted  . Seizure disorder (HCC) 10/28/2016  . Obstructive sleep apnea 07/05/2008  . DOWNS SYNDROME 07/05/2008    Past Surgical History:  Procedure Laterality Date  . IRRIGATION AND DEBRIDEMENT  07/01/2014      . ORIF HUMERUS FRACTURE Left 02/05/2013   Procedure: OPEN REDUCTION INTERNAL FIXATION (ORIF) MEDIAL HUMERUS CONDYLE FRACTURE;  Surgeon: Jodi Marble, MD;  Location: [facility];  Service: Orthopedics;  Laterality: Left;  . Right Elbow surgery         Home Medications    Prior to Admission medications   Medication Sig Start Date End Date Taking? Authorizing Provider  aspirin 81 MG tablet Take 81 mg by mouth daily.    [provider]    benazepril (LOTENSIN) 10 MG tablet Take 10 mg by mouth daily.    [provider]  calcitonin, salmon, (MIACALCIN/FORTICAL) 200 UNIT/ACT nasal spray Place 1 spray into alternate nostrils daily.    [provider]  cholecalciferol (VITAMIN D) 1000 units tablet Take 2,000 Units by mouth daily.    [provider]  divalproex (DEPAKOTE) 250 MG DR tablet Take 1 tablet (250 mg total) by mouth 2 (two) times daily. 03/03/17   [name], NP  Infant Care Products (JOHNSONS BABY SHAMPOO) SHAM 1/2 H2O and 1/2 shampoo-scrub eyebrows qd    [provider]  levETIRAcetam (KEPPRA) 500 MG tablet Take 1 tablet (500 mg total) by mouth 2 (two) times daily. 03/03/17   [name], NP  levothyroxine (SYNTHROID, LEVOTHROID) 100 MCG tablet Take 100 mcg by mouth daily before breakfast.    [provider]  SUNSCREEN SPF30 EX Apply 1 application topically daily as needed (prevent sunburn).    [provider]  tolnaftate (TINACTIN) 1 % powder Apply between toes and both feet nightly    [provider]  Vitamins A & D (VITAMIN A & D) ointment Apply to both cheeks every morning and each night prior to placing CPAP mask on    [provider]    Family History Family History  Problem Relation Age of Onset  . Dementia Maternal Grandmother   . Diabetes Paternal Grandfather   .  Heart failure Paternal Grandfather   . Hypertension Paternal Grandfather     Social History Social History   Tobacco Use  . Smoking status: Never Smoker  . Smokeless tobacco: Never Used  Substance Use Topics  . Alcohol use: No  . Drug use: No     Allergies   Patient has no known allergies.   Review of Systems Review of Systems  Unable to perform ROS: Patient nonverbal  Musculoskeletal: Positive for arthralgias and joint swelling.     Physical Exam Updated Vital Signs BP 134/75 (BP Location: Left Arm)   Pulse 82   Temp 98.2 F (36.8 C) (Oral)   Resp  16   Ht 5\' 6"  (1.676 m)   Wt 85.3 kg (188 lb)   SpO2 97%   BMI 30.34 kg/m   Physical Exam  Constitutional: He appears well-developed and well-nourished. No distress.  HENT:  Head: Normocephalic and atraumatic.  Eyes: Conjunctivae and EOM are normal. No scleral icterus.  Neck: Normal range of motion.  Pulmonary/Chest: Effort normal. No respiratory distress.  Musculoskeletal: Normal range of motion. He exhibits edema and tenderness.  Tenderness to palpation of the DIP joint of the right fourth digit.  There is associated bruising noted as well.  2+ radial pulse noted.  Unable to tell if sensation is intact as patient is nonverbal.  Neurological: He is alert.  Skin: No rash noted. He is not diaphoretic.  Psychiatric: He has a normal mood and affect.  Nursing note and vitals reviewed.      ED Treatments / Results  Labs (all labs ordered are listed, but only abnormal results are displayed) Labs Reviewed - No data to display  EKG  EKG Interpretation None       Radiology Dg Hand Complete Right  Result Date: 08/03/2017 CLINICAL DATA:  Bruising and swelling over the distal aspect of the fourth finger. EXAM: RIGHT HAND - COMPLETE 3+ VIEW COMPARISON:  None in PACs FINDINGS: The bones are subjectively mildly osteopenic. There is an acute avulsion fracture from the dorsal aspect of the base of the distal phalanx of the fourth finger. There is distraction of this fracture fragment by approximately 2 mm. Elsewhere the fourth finger appears intact. No fractures of the adjacent phalanges are observed. The metacarpals and carpals are grossly intact. There is no more than minimal joint space narrowing. IMPRESSION: There is an acute avulsion fracture from the dorsal aspect of the base of the distal phalanx of the fourth finger. This likely involves the extensor tendon insertion. No other fractures are observed. Electronically Signed   By: David  [place] M.D.   On: 08/03/2017 12:36     Procedures .Splint Application Date/Time: 08/03/2017 1:49 PM Performed by: Dietrich Pates, PA-C Authorized by: Dietrich Pates, PA-C   Consent:    Consent obtained:  Verbal   Consent given by:  Patient   Risks discussed:  Discoloration, numbness, pain and swelling Procedure details:    Laterality:  Right   Location:  Finger   Finger:  R ring finger   Splint type:  Finger   Supplies:  Aluminum splint Post-procedure details:    Pain:  Unchanged   Sensation:  Normal   Patient tolerance of procedure:  Tolerated well, no immediate complications   (including critical care time)  Medications Ordered in ED Medications - No data to display   Initial Impression / Assessment and Plan / ED Course  I have reviewed the triage vital signs and the nursing notes.  Pertinent labs &  imaging results that were available during my care of the patient were reviewed by me and considered in my medical decision making (see chart for details).     Patient presents to ED for evaluation of right fourth digit swelling and possible injury.  Patient is nonverbal due to his Down syndrome so was unable to obtain a full history.  Caretaker states that they noticed the swelling and bruising this morning.  Patient is able to flex and extend the digit without difficulty.  X-ray did show avulsion fracture of distal phalanx with possible extensor tendon insertion involvement.  I consulted Earney HamburgMichael Jeffrey from orthopedist who recommended placing in a finger splint and forced extension and to follow-up with orthopedist for further evaluation.  Area appears neurovascularly intact.  Patient appears stable for discharge at this time.  Strict return precautions given.  Portions of this note were generated with Scientist, clinical (histocompatibility and immunogenetics)Dragon dictation software. Dictation errors may occur despite best attempts at proofreading.   Final Clinical Impressions(s) / ED Diagnoses   Final diagnoses:  Closed nondisplaced fracture of distal phalanx of  right ring finger, initial encounter    ED Discharge Orders    None       Dietrich PatesKhatri, Arine Foley, PA-C 08/03/17 1349    Melene PlanFloyd, Dan, DO 08/03/17 1414

## 2017-08-03 NOTE — ED Notes (Signed)
Patient transported to X-ray 

## 2017-08-03 NOTE — ED Triage Notes (Signed)
Pt has bruising and swelling to rt ring finger, unknown as to what happened, Pt is nonverbal and lives in a group home

## 2017-08-03 NOTE — ED Notes (Signed)
Bed: WTR7 Expected date:  Expected time:  Means of arrival:  Comments: 

## 2017-08-03 NOTE — Discharge Instructions (Signed)
Please read attached information regarding your condition. Wear splint as directed. Take Tylenol or ibuprofen as needed for pain and swelling. Apply ice to area. Follow-up with the orthopedist listed below for further evaluation in the next 1-2 days. Return to ED for worsening symptoms, numbness to finger, additional injuries, red hot or tender joint.

## 2017-08-03 NOTE — ED Notes (Signed)
Pt returned from xray

## 2017-08-04 DIAGNOSIS — R300 Dysuria: Secondary | ICD-10-CM | POA: Diagnosis not present

## 2017-08-09 DIAGNOSIS — E559 Vitamin D deficiency, unspecified: Secondary | ICD-10-CM | POA: Diagnosis not present

## 2017-08-09 DIAGNOSIS — I10 Essential (primary) hypertension: Secondary | ICD-10-CM | POA: Diagnosis not present

## 2017-08-09 DIAGNOSIS — S62632D Displaced fracture of distal phalanx of right middle finger, subsequent encounter for fracture with routine healing: Secondary | ICD-10-CM | POA: Diagnosis not present

## 2017-08-09 DIAGNOSIS — E785 Hyperlipidemia, unspecified: Secondary | ICD-10-CM | POA: Diagnosis not present

## 2017-08-09 DIAGNOSIS — F72 Severe intellectual disabilities: Secondary | ICD-10-CM | POA: Diagnosis not present

## 2017-08-09 DIAGNOSIS — E039 Hypothyroidism, unspecified: Secondary | ICD-10-CM | POA: Diagnosis not present

## 2017-08-16 DIAGNOSIS — Q909 Down syndrome, unspecified: Secondary | ICD-10-CM | POA: Diagnosis not present

## 2017-08-16 DIAGNOSIS — E039 Hypothyroidism, unspecified: Secondary | ICD-10-CM | POA: Diagnosis not present

## 2017-08-16 DIAGNOSIS — S63295D Dislocation of distal interphalangeal joint of left ring finger, subsequent encounter: Secondary | ICD-10-CM | POA: Diagnosis not present

## 2017-08-17 DIAGNOSIS — M20011 Mallet finger of right finger(s): Secondary | ICD-10-CM | POA: Diagnosis not present

## 2017-08-30 ENCOUNTER — Ambulatory Visit (INDEPENDENT_AMBULATORY_CARE_PROVIDER_SITE_OTHER): Payer: Medicare Other | Admitting: Adult Health

## 2017-08-30 ENCOUNTER — Encounter: Payer: Self-pay | Admitting: Adult Health

## 2017-08-30 VITALS — BP 131/86 | HR 79 | Ht 66.0 in | Wt 177.2 lb

## 2017-08-30 DIAGNOSIS — M25561 Pain in right knee: Secondary | ICD-10-CM | POA: Diagnosis not present

## 2017-08-30 DIAGNOSIS — G40919 Epilepsy, unspecified, intractable, without status epilepticus: Secondary | ICD-10-CM | POA: Diagnosis not present

## 2017-08-30 DIAGNOSIS — M79641 Pain in right hand: Secondary | ICD-10-CM | POA: Diagnosis not present

## 2017-08-30 DIAGNOSIS — R569 Unspecified convulsions: Secondary | ICD-10-CM | POA: Diagnosis not present

## 2017-08-30 DIAGNOSIS — S62664D Nondisplaced fracture of distal phalanx of right ring finger, subsequent encounter for fracture with routine healing: Secondary | ICD-10-CM | POA: Diagnosis not present

## 2017-08-30 DIAGNOSIS — Z5181 Encounter for therapeutic drug level monitoring: Secondary | ICD-10-CM

## 2017-08-30 DIAGNOSIS — E039 Hypothyroidism, unspecified: Secondary | ICD-10-CM | POA: Diagnosis not present

## 2017-08-30 DIAGNOSIS — Q909 Down syndrome, unspecified: Secondary | ICD-10-CM

## 2017-08-30 DIAGNOSIS — M25531 Pain in right wrist: Secondary | ICD-10-CM | POA: Diagnosis not present

## 2017-08-30 NOTE — Progress Notes (Signed)
PATIENT: [name] DOB: 03-15-1966  REASON FOR VISIT: follow up HISTORY FROM: patient  HISTORY OF PRESENT ILLNESS: Today 08/30/17 [name] is a 52 year old male with a history of Down syndrome and seizures.  He returns today for follow-up.  His blood work at the last visit indicated an elevated ammonia level therefore Depakote was decreased to 250 mg twice a day and Keppra was increased to 500 mg twice a day.  I spoke to the patient's nurse over the phone.  She reports that directly after the increasing Keppra they noted some behavioral changes.  The patient has become more irritable. SHe denies any seizure events.  He continues to live at a group home.  The nurse denies any significant drowsiness.  He returns today for evaluation.  HISTORY 03/01/17 [name] is a 52 year old male with a history of Down syndrome and seizures. He returns today for follow-up. He is currently on Depakote 500 mg twice a day. He also takes Keppra 250 mg twice t he denies any seizure activity. He is here today with his dad and caregiver. Caregiver reports that he has not been told that the patient has had any seizure activity. Patient seems to be tolerating Keppra well. The patient was originally supposed to increase Keppra to 500 mg twice a day however this did not occur. Patient lives at a group home. They deny any new neurological conditions. Returns today for an evaluation.   REVIEW OF SYSTEMS: Out of a complete 14 system review of symptoms, the patient complains only of the following symptoms, and all other reviewed systems are negative.  See HPI  ALLERGIES: No Known Allergies  HOME MEDICATIONS: Outpatient Medications Prior to Visit  Medication Sig Dispense Refill  . aspirin 81 MG tablet Take 81 mg by mouth daily.    . benazepril (LOTENSIN) 10 MG tablet Take 10 mg by mouth daily.    . calcitonin, salmon, (MIACALCIN/FORTICAL) 200 UNIT/ACT nasal spray Place 1 spray into alternate  nostrils daily.    . cholecalciferol (VITAMIN D) 1000 units tablet Take 2,000 Units by mouth daily.    . divalproex (DEPAKOTE) 250 MG DR tablet Take 1 tablet (250 mg total) by mouth 2 (two) times daily. 60 tablet 5  . Infant Care Products (JOHNSONS BABY SHAMPOO) SHAM 1/2 H2O and 1/2 shampoo-scrub eyebrows qd    . levETIRAcetam (KEPPRA) 500 MG tablet Take 1 tablet (500 mg total) by mouth 2 (two) times daily. 60 tablet 5  . levothyroxine (SYNTHROID, LEVOTHROID) 100 MCG tablet Take 100 mcg by mouth daily before breakfast.    . SUNSCREEN SPF30 EX Apply 1 application topically daily as needed (prevent sunburn).    . tolnaftate (TINACTIN) 1 % powder Apply between toes and both feet nightly    . Vitamins A & D (VITAMIN A & D) ointment Apply to both cheeks every morning and each night prior to placing CPAP mask on     No facility-administered medications prior to visit.     PAST MEDICAL HISTORY: Past Medical History:  Diagnosis Date  . Down's syndrome   . GERD (gastroesophageal reflux disease)   . Hypertension   . Hypothyroid   . Lives in group home    [facility]-[phone]-fax-[phone]-Roxanne-nurse  . Nystagmus   . Obstructive sleep apnea (adult) (pediatric)    does use a cpap at night  . Seizure disorder (HCC) 10/28/2016  . Seizures (HCC)     PAST SURGICAL HISTORY: Past Surgical History:  Procedure Laterality Date  .  IRRIGATION AND DEBRIDEMENT  07/01/2014      . ORIF HUMERUS FRACTURE Left 02/05/2013   Procedure: OPEN REDUCTION INTERNAL FIXATION (ORIF) MEDIAL HUMERUS CONDYLE FRACTURE;  Surgeon: Jodi Marbleavid A Thompson, MD;  Location: [facility];  Service: Orthopedics;  Laterality: Left;  . Right Elbow surgery      FAMILY HISTORY: Family History  Problem Relation Age of Onset  . Dementia Maternal Grandmother   . Diabetes Paternal Grandfather   . Heart failure Paternal Grandfather   . Hypertension Paternal Grandfather     SOCIAL HISTORY: Social History    Socioeconomic History  . Marital status: Single    Spouse name: Not on file  . Number of children: 0  . Years of education: 6612  . Highest education level: Not on file  Occupational History  . 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 Financial resource strain: Not on file  . Food insecurity:    Worry: Not on file    Inability: Not on file  . Transportation needs:    Medical: Not on file    Non-medical: Not on file  Tobacco Use  . Smoking status: Never Smoker  . Smokeless tobacco: Never Used  Substance and Sexual Activity  . Alcohol use: No  . Drug use: No  . Sexual activity: Not on file  Lifestyle  . Physical activity:    Days per week: Not on file    Minutes per session: Not on file  . Stress: Not on file  Relationships  . Social connections:    Talks on phone: Not on file    Gets together: Not on file    Attends religious service: Not on file    Active member of club or organization: Not on file    Attends meetings of clubs or organizations: Not on file    Relationship status: Not on file  . Intimate partner violence:    Fear of current or ex partner: Not on file    Emotionally abused: Not on file    Physically abused: Not on file    Forced sexual activity: Not on file  Other Topics Concern  . Not on file  Social History Narrative   Lives at a group home   Caffeine use: Tea/soda once per week   Right handed      PHYSICAL EXAM  Vitals:   08/30/17 1334  Height: 5\' 6"  (1.676 m)   Body mass index is 30.34 kg/m.  Generalized: Well developed, in no acute distress   Neurological examination  Mentation: Alert.  Nonverbal Cranial nerve II-XII:  Extraocular movements were full, visual field were full on confrontational test.  Nystagmus noted with horizontal gaze.  Facial symmetry noted Motor: The motor testing reveals 5 over 5 strength of all 4 extremities. Good symmetric motor tone is noted throughout.  Sensory: Sensory testing is intact to soft touch on  all 4 extremities.  Coordination: Unable to test Gait and station: Gait is slightly wide-based.  Tandem gait not attempted.  DIAGNOSTIC DATA (LABS, IMAGING, TESTING) - I reviewed patient records, labs, notes, testing and imaging myself where available.  Lab Results  Component Value Date   WBC 5.3 03/01/2017   HGB 13.6 03/01/2017   HCT 39.7 03/01/2017   MCV 98 (H) 03/01/2017   PLT 265 03/01/2017      Component Value Date/Time   NA 140 03/01/2017 1456   K 4.5 03/01/2017 1456   CL 100 03/01/2017 1456   CO2 28 03/01/2017 1456  GLUCOSE 104 (H) 03/01/2017 1456   GLUCOSE 100 (H) 09/23/2015 1806   BUN 13 03/01/2017 1456   CREATININE 0.78 03/01/2017 1456   CALCIUM 9.0 03/01/2017 1456   PROT 6.8 03/01/2017 1456   ALBUMIN 3.8 03/01/2017 1456   AST 18 03/01/2017 1456   ALT 10 03/01/2017 1456   ALKPHOS 61 03/01/2017 1456   BILITOT 0.2 03/01/2017 1456   GFRNONAA 106 03/01/2017 1456   GFRAA 123 03/01/2017 1456      ASSESSMENT AND PLAN 52 y.o. year old male  has a past medical history of Down's syndrome, GERD (gastroesophageal reflux disease), Hypertension, Hypothyroid, Lives in group home, Nystagmus, Obstructive sleep apnea (adult) (pediatric), Seizure disorder (HCC) (10/28/2016), and Seizures (HCC). here with:  1.  Seizures 2.  Down syndrome  There has been some irritability since Keppra was increased.  I will check Depakote level and ammonia level.  Pending blood work we will make medication adjustments.  Patient's family voiced understanding.  He will follow-up in 6 months or sooner if needed.    [name], MSN, NP-C 08/30/2017, 2:03 PM [facility] [address] [place], [place] [address] [phone]

## 2017-08-30 NOTE — Progress Notes (Signed)
I have read the note, and I agree with the clinical assessment and plan.  Dalena Plantz K Sherri Mcarthy   

## 2017-08-30 NOTE — Patient Instructions (Signed)
Your Plan:  Continue Depakote and Keppra If ammonia is normal- we will adjust medication If your symptoms worsen or you develop new symptoms please let us know.   [facility]. I hope we have been able to provide you high quality care today.  You may receive a patient satisfaction survey over the next few weeks. We would appreciate your feedback and comments so that we may continue to improve ourselves and the health of our patients.

## 2017-08-31 ENCOUNTER — Telehealth: Payer: Self-pay | Admitting: Adult Health

## 2017-08-31 LAB — CBC WITH DIFFERENTIAL/PLATELET
BASOS ABS: 0 10*3/uL (ref 0.0–0.2)
Basos: 1 %
EOS (ABSOLUTE): 0 10*3/uL (ref 0.0–0.4)
Eos: 0 %
HEMOGLOBIN: 13.6 g/dL (ref 13.0–17.7)
Hematocrit: 40 % (ref 37.5–51.0)
IMMATURE GRANULOCYTES: 0 %
Immature Grans (Abs): 0 10*3/uL (ref 0.0–0.1)
LYMPHS ABS: 1.8 10*3/uL (ref 0.7–3.1)
Lymphs: 33 %
MCH: 32.8 pg (ref 26.6–33.0)
MCHC: 34 g/dL (ref 31.5–35.7)
MCV: 96 fL (ref 79–97)
MONOCYTES: 9 %
Monocytes Absolute: 0.5 10*3/uL (ref 0.1–0.9)
NEUTROS PCT: 57 %
Neutrophils Absolute: 3 10*3/uL (ref 1.4–7.0)
Platelets: 255 10*3/uL (ref 150–379)
RBC: 4.15 x10E6/uL (ref 4.14–5.80)
RDW: 14 % (ref 12.3–15.4)
WBC: 5.3 10*3/uL (ref 3.4–10.8)

## 2017-08-31 LAB — COMPREHENSIVE METABOLIC PANEL
ALK PHOS: 72 IU/L (ref 39–117)
ALT: 12 IU/L (ref 0–44)
AST: 17 IU/L (ref 0–40)
Albumin/Globulin Ratio: 1.2 (ref 1.2–2.2)
Albumin: 3.7 g/dL (ref 3.5–5.5)
BILIRUBIN TOTAL: 0.2 mg/dL (ref 0.0–1.2)
BUN / CREAT RATIO: 19 (ref 9–20)
BUN: 14 mg/dL (ref 6–24)
CO2: 23 mmol/L (ref 20–29)
CREATININE: 0.74 mg/dL — AB (ref 0.76–1.27)
Calcium: 9.1 mg/dL (ref 8.7–10.2)
Chloride: 100 mmol/L (ref 96–106)
GFR calc Af Amer: 125 mL/min/{1.73_m2} (ref >59)
GFR calc non Af Amer: 108 mL/min/{1.73_m2} (ref >59)
GLUCOSE: 99 mg/dL (ref 65–99)
Globulin, Total: 3.2 g/dL (ref 1.5–4.5)
Potassium: 4.6 mmol/L (ref 3.5–5.2)
Sodium: 140 mmol/L (ref 134–144)
Total Protein: 6.9 g/dL (ref 6.0–8.5)

## 2017-08-31 LAB — VALPROIC ACID LEVEL: VALPROIC ACID LVL: 38 ug/mL — AB (ref 50–100)

## 2017-08-31 LAB — AMMONIA: AMMONIA: 121 ug/dL — AB (ref 27–102)

## 2017-08-31 MED ORDER — LACOSAMIDE 50 MG PO TABS: ORAL_TABLET | ORAL | 5 refills | Status: DC

## 2017-08-31 NOTE — Telephone Encounter (Signed)
Bjorn Loserhonda, RN with patient's group home states Dr. Dayna Barkeroyals his PCP started him on Lactulose 10 grams per 15ml taking daily and Vitamin B6 100mg  daily. A returned call is not needed unless there are questions.

## 2017-08-31 NOTE — Telephone Encounter (Signed)
I called the patient's family in the facility.  I made him aware that the blood work showed that the ammonia level was elevated.  Liver function is normal.  I discussed with Dr. Anne HahnWillis.  We will start the patient on Vimpat 50 mg twice a day for 2 weeks.  After 2 weeks Depakote will be discontinued.  At that time Vimpat will be increased to 100 mg twice a day.  If he continues to tolerate Vimpat we will slowly wean the patient off of Keppra.  [name] family voiced understanding.  The nurse at the group home also voiced understanding and repeated orders back to me.  She stated that she will give these instructions to her medical director and he will provide her with orders.  She voiced that we do not need to fax a prescription orders to the facility.  This will come from Wellsite geologistmedical director.

## 2017-09-06 DIAGNOSIS — F840 Autistic disorder: Secondary | ICD-10-CM | POA: Diagnosis not present

## 2017-09-06 DIAGNOSIS — G40909 Epilepsy, unspecified, not intractable, without status epilepticus: Secondary | ICD-10-CM | POA: Diagnosis not present

## 2017-09-06 DIAGNOSIS — F71 Moderate intellectual disabilities: Secondary | ICD-10-CM | POA: Diagnosis not present

## 2017-09-06 DIAGNOSIS — K5900 Constipation, unspecified: Secondary | ICD-10-CM | POA: Diagnosis not present

## 2017-09-06 DIAGNOSIS — I10 Essential (primary) hypertension: Secondary | ICD-10-CM | POA: Diagnosis not present

## 2017-09-13 ENCOUNTER — Telehealth: Payer: Self-pay | Admitting: Adult Health

## 2017-09-13 DIAGNOSIS — Q909 Down syndrome, unspecified: Secondary | ICD-10-CM | POA: Diagnosis not present

## 2017-09-13 DIAGNOSIS — Z6831 Body mass index (BMI) 31.0-31.9, adult: Secondary | ICD-10-CM | POA: Diagnosis not present

## 2017-09-13 DIAGNOSIS — G40919 Epilepsy, unspecified, intractable, without status epilepticus: Secondary | ICD-10-CM | POA: Diagnosis not present

## 2017-09-13 DIAGNOSIS — E039 Hypothyroidism, unspecified: Secondary | ICD-10-CM | POA: Diagnosis not present

## 2017-09-13 NOTE — Telephone Encounter (Signed)
[facility] [phone] needs clarification on lacosamide (VIMPAT) 50 MG TABS tablet and levETIRAcetam (KEPPRA) 500 MG tablet. She said tomorrow will be the 14th day for the vimpat, she wants to ensure she is increasing it properly. Please call asap.

## 2017-09-13 NOTE — Telephone Encounter (Signed)
[place] and went over the instructions that were [facility] last [facility] conversations. At this point they are on day 14 of vimpat I have instructed per last conversation to dc the depakote and increase the vimpat to 100 mg BID. [name] should give us a call back in a couple weeks and let us know how he is doing and if he is tolerating well then we will discuss weaning off the Keppra. [name] verbalized understanding and was appreciative for the call back. She will keep us updated.

## 2017-10-04 DIAGNOSIS — K219 Gastro-esophageal reflux disease without esophagitis: Secondary | ICD-10-CM | POA: Diagnosis not present

## 2017-10-04 DIAGNOSIS — F840 Autistic disorder: Secondary | ICD-10-CM | POA: Diagnosis not present

## 2017-10-04 DIAGNOSIS — F72 Severe intellectual disabilities: Secondary | ICD-10-CM | POA: Diagnosis not present

## 2017-10-04 DIAGNOSIS — E785 Hyperlipidemia, unspecified: Secondary | ICD-10-CM | POA: Diagnosis not present

## 2017-10-04 DIAGNOSIS — E039 Hypothyroidism, unspecified: Secondary | ICD-10-CM | POA: Diagnosis not present

## 2017-10-04 DIAGNOSIS — I10 Essential (primary) hypertension: Secondary | ICD-10-CM | POA: Diagnosis not present

## 2017-10-11 DIAGNOSIS — E039 Hypothyroidism, unspecified: Secondary | ICD-10-CM | POA: Diagnosis not present

## 2017-10-11 DIAGNOSIS — Q909 Down syndrome, unspecified: Secondary | ICD-10-CM | POA: Diagnosis not present

## 2017-10-11 DIAGNOSIS — J00 Acute nasopharyngitis [common cold]: Secondary | ICD-10-CM | POA: Diagnosis not present

## 2017-10-12 DIAGNOSIS — Q909 Down syndrome, unspecified: Secondary | ICD-10-CM | POA: Diagnosis not present

## 2017-10-12 DIAGNOSIS — G40919 Epilepsy, unspecified, intractable, without status epilepticus: Secondary | ICD-10-CM | POA: Diagnosis not present

## 2017-10-12 DIAGNOSIS — E039 Hypothyroidism, unspecified: Secondary | ICD-10-CM | POA: Diagnosis not present

## 2017-10-27 DIAGNOSIS — R7989 Other specified abnormal findings of blood chemistry: Secondary | ICD-10-CM | POA: Diagnosis not present

## 2017-10-27 DIAGNOSIS — Z79899 Other long term (current) drug therapy: Secondary | ICD-10-CM | POA: Diagnosis not present

## 2017-10-27 DIAGNOSIS — E785 Hyperlipidemia, unspecified: Secondary | ICD-10-CM | POA: Diagnosis not present

## 2017-10-27 DIAGNOSIS — E039 Hypothyroidism, unspecified: Secondary | ICD-10-CM | POA: Diagnosis not present

## 2017-10-27 DIAGNOSIS — E559 Vitamin D deficiency, unspecified: Secondary | ICD-10-CM | POA: Diagnosis not present

## 2017-11-01 DIAGNOSIS — E039 Hypothyroidism, unspecified: Secondary | ICD-10-CM | POA: Diagnosis not present

## 2017-11-01 DIAGNOSIS — Q909 Down syndrome, unspecified: Secondary | ICD-10-CM | POA: Diagnosis not present

## 2017-11-01 DIAGNOSIS — G40919 Epilepsy, unspecified, intractable, without status epilepticus: Secondary | ICD-10-CM | POA: Diagnosis not present

## 2017-11-01 DIAGNOSIS — Z6831 Body mass index (BMI) 31.0-31.9, adult: Secondary | ICD-10-CM | POA: Diagnosis not present

## 2017-11-08 DIAGNOSIS — F72 Severe intellectual disabilities: Secondary | ICD-10-CM | POA: Diagnosis not present

## 2017-11-08 DIAGNOSIS — K5900 Constipation, unspecified: Secondary | ICD-10-CM | POA: Diagnosis not present

## 2017-11-08 DIAGNOSIS — E559 Vitamin D deficiency, unspecified: Secondary | ICD-10-CM | POA: Diagnosis not present

## 2017-11-08 DIAGNOSIS — I10 Essential (primary) hypertension: Secondary | ICD-10-CM | POA: Diagnosis not present

## 2017-11-08 DIAGNOSIS — F840 Autistic disorder: Secondary | ICD-10-CM | POA: Diagnosis not present

## 2017-11-08 DIAGNOSIS — G4733 Obstructive sleep apnea (adult) (pediatric): Secondary | ICD-10-CM | POA: Diagnosis not present

## 2017-11-14 DIAGNOSIS — Q909 Down syndrome, unspecified: Secondary | ICD-10-CM | POA: Diagnosis not present

## 2017-11-14 DIAGNOSIS — H4423 Degenerative myopia, bilateral: Secondary | ICD-10-CM | POA: Diagnosis not present

## 2017-11-15 DIAGNOSIS — E039 Hypothyroidism, unspecified: Secondary | ICD-10-CM | POA: Diagnosis not present

## 2017-11-15 DIAGNOSIS — Q909 Down syndrome, unspecified: Secondary | ICD-10-CM | POA: Diagnosis not present

## 2017-11-15 DIAGNOSIS — H04123 Dry eye syndrome of bilateral lacrimal glands: Secondary | ICD-10-CM | POA: Diagnosis not present

## 2017-11-15 DIAGNOSIS — Z6830 Body mass index (BMI) 30.0-30.9, adult: Secondary | ICD-10-CM | POA: Diagnosis not present

## 2017-11-15 DIAGNOSIS — G40919 Epilepsy, unspecified, intractable, without status epilepticus: Secondary | ICD-10-CM | POA: Diagnosis not present

## 2017-11-29 DIAGNOSIS — E039 Hypothyroidism, unspecified: Secondary | ICD-10-CM | POA: Diagnosis not present

## 2017-11-29 DIAGNOSIS — Q909 Down syndrome, unspecified: Secondary | ICD-10-CM | POA: Diagnosis not present

## 2017-11-29 DIAGNOSIS — Z6831 Body mass index (BMI) 31.0-31.9, adult: Secondary | ICD-10-CM | POA: Diagnosis not present

## 2017-11-29 DIAGNOSIS — G40919 Epilepsy, unspecified, intractable, without status epilepticus: Secondary | ICD-10-CM | POA: Diagnosis not present

## 2017-12-06 DIAGNOSIS — E559 Vitamin D deficiency, unspecified: Secondary | ICD-10-CM | POA: Diagnosis not present

## 2017-12-06 DIAGNOSIS — K219 Gastro-esophageal reflux disease without esophagitis: Secondary | ICD-10-CM | POA: Diagnosis not present

## 2017-12-06 DIAGNOSIS — Q909 Down syndrome, unspecified: Secondary | ICD-10-CM | POA: Diagnosis not present

## 2017-12-06 DIAGNOSIS — F72 Severe intellectual disabilities: Secondary | ICD-10-CM | POA: Diagnosis not present

## 2017-12-06 DIAGNOSIS — E039 Hypothyroidism, unspecified: Secondary | ICD-10-CM | POA: Diagnosis not present

## 2017-12-06 DIAGNOSIS — I10 Essential (primary) hypertension: Secondary | ICD-10-CM | POA: Diagnosis not present

## 2017-12-06 DIAGNOSIS — E785 Hyperlipidemia, unspecified: Secondary | ICD-10-CM | POA: Diagnosis not present

## 2017-12-13 DIAGNOSIS — E039 Hypothyroidism, unspecified: Secondary | ICD-10-CM | POA: Diagnosis not present

## 2017-12-13 DIAGNOSIS — Z6831 Body mass index (BMI) 31.0-31.9, adult: Secondary | ICD-10-CM | POA: Diagnosis not present

## 2017-12-13 DIAGNOSIS — G40919 Epilepsy, unspecified, intractable, without status epilepticus: Secondary | ICD-10-CM | POA: Diagnosis not present

## 2017-12-13 DIAGNOSIS — Q909 Down syndrome, unspecified: Secondary | ICD-10-CM | POA: Diagnosis not present

## 2017-12-26 ENCOUNTER — Ambulatory Visit: Payer: Medicare Other | Admitting: Internal Medicine

## 2018-01-09 ENCOUNTER — Ambulatory Visit
Admission: RE | Admit: 2018-01-09 | Discharge: 2018-01-09 | Disposition: A | Discharge status: Home / Self Care | Payer: Medicare Other | Source: Ambulatory Visit | Attending: Family Medicine | Admitting: Family Medicine

## 2018-01-09 ENCOUNTER — Other Ambulatory Visit: Payer: Self-pay | Admitting: Family Medicine

## 2018-01-09 DIAGNOSIS — M25561 Pain in right knee: Secondary | ICD-10-CM | POA: Diagnosis not present

## 2018-01-09 DIAGNOSIS — R52 Pain, unspecified: Secondary | ICD-10-CM

## 2018-01-09 DIAGNOSIS — M25551 Pain in right hip: Secondary | ICD-10-CM | POA: Diagnosis not present

## 2018-01-09 DIAGNOSIS — M25571 Pain in right ankle and joints of right foot: Secondary | ICD-10-CM | POA: Diagnosis not present

## 2018-01-09 DIAGNOSIS — M79671 Pain in right foot: Secondary | ICD-10-CM | POA: Diagnosis not present

## 2018-01-10 DIAGNOSIS — R262 Difficulty in walking, not elsewhere classified: Secondary | ICD-10-CM | POA: Diagnosis not present

## 2018-01-10 DIAGNOSIS — K5900 Constipation, unspecified: Secondary | ICD-10-CM | POA: Diagnosis not present

## 2018-01-10 DIAGNOSIS — F72 Severe intellectual disabilities: Secondary | ICD-10-CM | POA: Diagnosis not present

## 2018-01-10 DIAGNOSIS — F840 Autistic disorder: Secondary | ICD-10-CM | POA: Diagnosis not present

## 2018-01-10 DIAGNOSIS — G40909 Epilepsy, unspecified, not intractable, without status epilepticus: Secondary | ICD-10-CM | POA: Diagnosis not present

## 2018-01-10 DIAGNOSIS — M79604 Pain in right leg: Secondary | ICD-10-CM | POA: Diagnosis not present

## 2018-01-10 DIAGNOSIS — I10 Essential (primary) hypertension: Secondary | ICD-10-CM | POA: Diagnosis not present

## 2018-01-11 DIAGNOSIS — G40919 Epilepsy, unspecified, intractable, without status epilepticus: Secondary | ICD-10-CM | POA: Diagnosis not present

## 2018-01-11 DIAGNOSIS — E039 Hypothyroidism, unspecified: Secondary | ICD-10-CM | POA: Diagnosis not present

## 2018-01-11 DIAGNOSIS — M545 Low back pain: Secondary | ICD-10-CM | POA: Diagnosis not present

## 2018-01-11 DIAGNOSIS — Q909 Down syndrome, unspecified: Secondary | ICD-10-CM | POA: Diagnosis not present

## 2018-01-12 DIAGNOSIS — M25551 Pain in right hip: Secondary | ICD-10-CM | POA: Diagnosis not present

## 2018-01-12 DIAGNOSIS — S161XXA Strain of muscle, fascia and tendon at neck level, initial encounter: Secondary | ICD-10-CM | POA: Diagnosis not present

## 2018-01-19 ENCOUNTER — Ambulatory Visit
Admission: RE | Admit: 2018-01-19 | Discharge: 2018-01-19 | Disposition: A | Discharge status: Home / Self Care | Payer: Medicare Other | Source: Ambulatory Visit | Attending: Orthopedic Surgery | Admitting: Orthopedic Surgery

## 2018-01-19 ENCOUNTER — Other Ambulatory Visit: Payer: Self-pay | Admitting: Orthopedic Surgery

## 2018-01-19 DIAGNOSIS — M25551 Pain in right hip: Secondary | ICD-10-CM

## 2018-01-26 DIAGNOSIS — M25551 Pain in right hip: Secondary | ICD-10-CM | POA: Diagnosis not present

## 2018-02-09 DIAGNOSIS — F329 Major depressive disorder, single episode, unspecified: Secondary | ICD-10-CM | POA: Diagnosis not present

## 2018-02-14 DIAGNOSIS — E039 Hypothyroidism, unspecified: Secondary | ICD-10-CM | POA: Diagnosis not present

## 2018-02-14 DIAGNOSIS — Z6830 Body mass index (BMI) 30.0-30.9, adult: Secondary | ICD-10-CM | POA: Diagnosis not present

## 2018-02-14 DIAGNOSIS — Q909 Down syndrome, unspecified: Secondary | ICD-10-CM | POA: Diagnosis not present

## 2018-02-14 DIAGNOSIS — I959 Hypotension, unspecified: Secondary | ICD-10-CM | POA: Diagnosis not present

## 2018-02-17 ENCOUNTER — Emergency Department (HOSPITAL_COMMUNITY)
Admission: EM | Admit: 2018-02-17 | Discharge: 2018-02-18 | Disposition: A | Discharge status: Home / Self Care | Payer: Medicare Other | Attending: Emergency Medicine | Admitting: Emergency Medicine

## 2018-02-17 ENCOUNTER — Encounter (HOSPITAL_COMMUNITY): Payer: Self-pay | Admitting: Emergency Medicine

## 2018-02-17 ENCOUNTER — Other Ambulatory Visit: Payer: Self-pay

## 2018-02-17 DIAGNOSIS — I10 Essential (primary) hypertension: Secondary | ICD-10-CM | POA: Insufficient documentation

## 2018-02-17 DIAGNOSIS — Y9289 Other specified places as the place of occurrence of the external cause: Secondary | ICD-10-CM | POA: Diagnosis not present

## 2018-02-17 DIAGNOSIS — E039 Hypothyroidism, unspecified: Secondary | ICD-10-CM | POA: Insufficient documentation

## 2018-02-17 DIAGNOSIS — S62617A Displaced fracture of proximal phalanx of left little finger, initial encounter for closed fracture: Secondary | ICD-10-CM | POA: Diagnosis not present

## 2018-02-17 DIAGNOSIS — Q909 Down syndrome, unspecified: Secondary | ICD-10-CM | POA: Insufficient documentation

## 2018-02-17 DIAGNOSIS — Z79899 Other long term (current) drug therapy: Secondary | ICD-10-CM | POA: Diagnosis not present

## 2018-02-17 DIAGNOSIS — Y939 Activity, unspecified: Secondary | ICD-10-CM | POA: Diagnosis not present

## 2018-02-17 DIAGNOSIS — Y999 Unspecified external cause status: Secondary | ICD-10-CM | POA: Insufficient documentation

## 2018-02-17 DIAGNOSIS — M79642 Pain in left hand: Secondary | ICD-10-CM | POA: Diagnosis not present

## 2018-02-17 DIAGNOSIS — X500XXA Overexertion from strenuous movement or load, initial encounter: Secondary | ICD-10-CM | POA: Diagnosis not present

## 2018-02-17 DIAGNOSIS — S62647A Nondisplaced fracture of proximal phalanx of left little finger, initial encounter for closed fracture: Secondary | ICD-10-CM | POA: Diagnosis not present

## 2018-02-17 DIAGNOSIS — S6992XA Unspecified injury of left wrist, hand and finger(s), initial encounter: Secondary | ICD-10-CM | POA: Diagnosis present

## 2018-02-17 NOTE — ED Notes (Signed)
Bed: WTR6 Expected date:  Expected time:  Means of arrival:  Comments: 

## 2018-02-17 NOTE — ED Triage Notes (Signed)
[facility] with caregiver for broken pinky on left hand. Acute fracture has already been confirmed by mobile x-ray that went to the facility.

## 2018-02-18 ENCOUNTER — Emergency Department (HOSPITAL_COMMUNITY): Payer: Medicare Other

## 2018-02-18 DIAGNOSIS — S62617A Displaced fracture of proximal phalanx of left little finger, initial encounter for closed fracture: Secondary | ICD-10-CM | POA: Diagnosis not present

## 2018-02-18 DIAGNOSIS — S62647A Nondisplaced fracture of proximal phalanx of left little finger, initial encounter for closed fracture: Secondary | ICD-10-CM | POA: Diagnosis not present

## 2018-02-18 NOTE — Discharge Instructions (Signed)
You have a fracture of the left pinky finger, you will need to remain in splint until you are seen in follow-up with Dr. Mina MarbleWeingold with hand surgery.  Tylenol as needed for pain as well as ice and elevation.  Return to the emergency department for significantly worsened pain, swelling, discoloration of the finger, numbness or tingling or any other new or concerning symptoms.

## 2018-02-18 NOTE — ED Provider Notes (Signed)
[facility]-EMERGENCY DEPT Provider Note   CSN: [id number] Arrival date & time: 02/17/18  2219     History   Chief Complaint Chief Complaint  Patient presents with  . Broken finger    HPI [name] is a 52 y.o. male.  [name] is a 52 y.o. Male history of Down syndrome, seizures, sleep apnea, hypertension and hypothyroidism, who presents to the emergency department for evaluation of injury to the left pinky finger.  He is accompanied by a staff from [facility] group home where the patient resides, they report that they had a mobile x-ray that came to the facility and confirmed fracture, but they did not bring in films or copy of the x-ray with them.  They are unsure how he injured the finger, think he may have jammed it, he is able to bend the finger at all joints with some discomfort, denies any numbness or tingling.  No pain in the hand or wrist.  There is a small bruise noted over the middle phalanx of the finger, but no cuts or wounds, no damage to the nailbed.  He has not taken anything for pain prior to arrival and appears comfortable.  No other aggravating or alleviating factors.     Past Medical History:  Diagnosis Date  . Down's syndrome   . GERD (gastroesophageal reflux disease)   . Hypertension   . Hypothyroid   . Lives in group home    [facility]-[phone]-fax-[phone]-Roxanne-nurse  . Nystagmus   . Obstructive sleep apnea (adult) (pediatric)    does use a cpap at night  . Seizure disorder (HCC) 10/28/2016  . Seizures [facility])     Patient Active Problem List   Diagnosis Date Noted  . Seizure disorder (HCC) 10/28/2016  . Obstructive sleep apnea 07/05/2008  . DOWNS SYNDROME 07/05/2008    Past Surgical History:  Procedure Laterality Date  . IRRIGATION AND DEBRIDEMENT  07/01/2014      . ORIF HUMERUS FRACTURE Left 02/05/2013   Procedure: OPEN REDUCTION INTERNAL FIXATION (ORIF) MEDIAL HUMERUS CONDYLE FRACTURE;  Surgeon: Jodi Marble, MD;  Location: [facility];  Service: Orthopedics;  Laterality: Left;  . Right Elbow surgery          Home Medications    Prior to Admission medications   Medication Sig Start Date End Date Taking? Authorizing Provider  acetaminophen (TYLENOL) 325 MG tablet Take 650 mg by mouth every 6 (six) hours as needed.    [provider]  benazepril (LOTENSIN) 10 MG tablet Take 10 mg by mouth daily.    [provider]  calcitonin, salmon, (MIACALCIN/FORTICAL) 200 UNIT/ACT nasal spray Place 1 spray into alternate nostrils daily.    [provider]  cholecalciferol (VITAMIN D) 1000 units tablet Take 2,000 Units by mouth daily.    [provider]  divalproex (DEPAKOTE) 250 MG DR tablet Take 1 tablet (250 mg total) by mouth 2 (two) times daily. 03/03/17   [name], NP  Infant Care Products (JOHNSONS BABY SHAMPOO) SHAM 1/2 H2O and 1/2 shampoo-scrub eyebrows qd    [provider]  lacosamide (VIMPAT) 50 MG TABS tablet Take 1 tab PO BID for 2 weeks then increase 2 tabs PO BID thereafter. 08/31/17   [name], NP  lactulose (CHRONULAC) 10 GM/15ML solution Take 10 g by mouth daily.    [provider]  levETIRAcetam (KEPPRA) 500 MG tablet Take 1 tablet (500 mg total) by mouth 2 (two) times daily. 03/03/17  Butch PennyMillikan, Megan, NP  levothyroxine (SYNTHROID, LEVOTHROID) 100 MCG tablet Take 100 mcg by mouth daily before breakfast.    [provider]  loperamide (IMODIUM) 2 MG capsule Take by mouth as needed for diarrhea or loose stools.    [provider]  pyridOXINE (VITAMIN B-6) 100 MG tablet Take 100 mg by mouth daily.    [provider]  SUNSCREEN SPF30 EX Apply 1 application topically daily as needed (prevent sunburn).    [provider]  terbinafine (LAMISIL) 1 % cream Apply 1 application topically daily as needed.    [provider]    Family History Family History  Problem  Relation Age of Onset  . Dementia Maternal Grandmother   . Diabetes Paternal Grandfather   . Heart failure Paternal Grandfather   . Hypertension Paternal Grandfather     Social History Social History   Tobacco Use  . Smoking status: Never Smoker  . Smokeless tobacco: Never Used  Substance Use Topics  . Alcohol use: No  . Drug use: No     Allergies   Patient has no known allergies.   Review of Systems Review of Systems  Constitutional: Negative for chills and fever.  Musculoskeletal: Positive for arthralgias and joint swelling.  Skin: Positive for color change. Negative for wound.  Neurological: Negative for weakness and numbness.     Physical Exam Updated Vital Signs BP (!) 126/93 (BP Location: Left Arm)   Pulse (!) 17   Temp 98 F (36.7 C) (Oral)   Resp 16   SpO2 98%   Physical Exam  Constitutional: He appears well-developed and well-nourished. No distress.  HENT:  Head: Normocephalic and atraumatic.  Eyes: Right eye exhibits no discharge. Left eye exhibits no discharge.  Pulmonary/Chest: Effort normal. No respiratory distress.  Musculoskeletal:  Tenderness over the left fifth finger primarily over the proximal phalanx there is slight ecchymosis noted over the middle phalanx, slight curvature noted to the finger, caregiver thinks this may be more chronic, patient is able to bend and extend at all joints with some discomfort, 5/5 strength and good grip, sensation intact throughout the finger, good capillary refill and 2+ radial pulse.  Neurological: He is alert. Coordination normal.  Skin: Skin is warm and dry. He is not diaphoretic.  Psychiatric: He has a normal mood and affect. His behavior is normal.  Nursing note and vitals reviewed.    ED Treatments / Results  Labs (all labs ordered are listed, but only abnormal results are displayed) Labs Reviewed - No data to display  EKG None  Radiology No results found.  Procedures Procedures (including  critical care time)  Medications Ordered in ED Medications - No data to display   Initial Impression / Assessment and Plan / ED Course  I have reviewed the triage vital signs and the nursing notes.  Pertinent labs & imaging results that were available during my care of the patient were reviewed by me and considered in my medical decision making (see chart for details).  Patient presents for evaluation of injury to the left fifth finger, x-ray confirms a fracture of the proximal phalanx with question of some intra-articular extension, he has normal range of motion at all joints with good strength and is neurovascularly intact.  Will place patient in splint and have him follow-up with Dr. Mina MarbleWeingold with hand, as well as his primary care doctor.  Tylenol as needed for pain.  Ice and elevation.  Return precautions discussed.  Patient in group home staff  expressed understanding and are in agreement with plan.  Final Clinical Impressions(s) / ED Diagnoses   Final diagnoses:  Closed nondisplaced fracture of proximal phalanx of left little finger, initial encounter    ED Discharge Orders    None       Dartha Lodge, [place] 02/18/18 0142    Geoffery Lyons, MD 02/18/18 [phone]

## 2018-02-18 NOTE — ED Notes (Signed)
Patient ambulated out of the ED in no distress. Educated about his finger splint and home care.

## 2018-02-21 ENCOUNTER — Telehealth: Payer: Self-pay | Admitting: Adult Health

## 2018-02-21 DIAGNOSIS — S62617A Displaced fracture of proximal phalanx of left little finger, initial encounter for closed fracture: Secondary | ICD-10-CM | POA: Diagnosis not present

## 2018-02-21 NOTE — Telephone Encounter (Signed)
Dr. Pennie Banterachel Stein with [facility] calling to discuss patient's mood since reducing levETIRAcetam (KEPPRA) 500 MG tablet and adding lacosamide (VIMPAT) 50 MG TABS tablet.  She would like to discuss discontinuing Keppra. She can be reached at [phone].

## 2018-02-21 NOTE — Telephone Encounter (Signed)
Returned call from Dr Meredith ModyStein. Her VM stated she is only in her office on Mon, Tues and Thurs.  This RN LVM informing her that the call was to gather more information, and the NP is out of the office until tomorrow morning. Left office number.

## 2018-02-23 ENCOUNTER — Inpatient Hospital Stay (HOSPITAL_COMMUNITY)
Admission: EM | Admit: 2018-02-23 | Discharge: 2018-02-24 | DRG: 310 | Disposition: A | Discharge status: Home / Self Care | Payer: Medicare Other | Attending: Student in an Organized Health Care Education/Training Program | Admitting: Student in an Organized Health Care Education/Training Program

## 2018-02-23 ENCOUNTER — Encounter (HOSPITAL_COMMUNITY): Payer: Self-pay | Admitting: Emergency Medicine

## 2018-02-23 ENCOUNTER — Emergency Department (HOSPITAL_COMMUNITY): Payer: Medicare Other

## 2018-02-23 ENCOUNTER — Other Ambulatory Visit: Payer: Self-pay

## 2018-02-23 DIAGNOSIS — R001 Bradycardia, unspecified: Principal | ICD-10-CM | POA: Diagnosis present

## 2018-02-23 DIAGNOSIS — Z833 Family history of diabetes mellitus: Secondary | ICD-10-CM | POA: Diagnosis not present

## 2018-02-23 DIAGNOSIS — T50905A Adverse effect of unspecified drugs, medicaments and biological substances, initial encounter: Secondary | ICD-10-CM

## 2018-02-23 DIAGNOSIS — I4510 Unspecified right bundle-branch block: Secondary | ICD-10-CM | POA: Diagnosis present

## 2018-02-23 DIAGNOSIS — T465X5A Adverse effect of other antihypertensive drugs, initial encounter: Secondary | ICD-10-CM | POA: Diagnosis present

## 2018-02-23 DIAGNOSIS — Z9181 History of falling: Secondary | ICD-10-CM

## 2018-02-23 DIAGNOSIS — G4733 Obstructive sleep apnea (adult) (pediatric): Secondary | ICD-10-CM | POA: Diagnosis present

## 2018-02-23 DIAGNOSIS — Z87311 Personal history of (healed) other pathological fracture: Secondary | ICD-10-CM | POA: Diagnosis not present

## 2018-02-23 DIAGNOSIS — Z8249 Family history of ischemic heart disease and other diseases of the circulatory system: Secondary | ICD-10-CM

## 2018-02-23 DIAGNOSIS — I5030 Unspecified diastolic (congestive) heart failure: Secondary | ICD-10-CM | POA: Diagnosis not present

## 2018-02-23 DIAGNOSIS — Q909 Down syndrome, unspecified: Secondary | ICD-10-CM | POA: Diagnosis not present

## 2018-02-23 DIAGNOSIS — I10 Essential (primary) hypertension: Secondary | ICD-10-CM | POA: Diagnosis present

## 2018-02-23 DIAGNOSIS — G4489 Other headache syndrome: Secondary | ICD-10-CM | POA: Diagnosis not present

## 2018-02-23 DIAGNOSIS — R55 Syncope and collapse: Secondary | ICD-10-CM | POA: Diagnosis not present

## 2018-02-23 DIAGNOSIS — G40909 Epilepsy, unspecified, not intractable, without status epilepticus: Secondary | ICD-10-CM | POA: Diagnosis present

## 2018-02-23 DIAGNOSIS — E039 Hypothyroidism, unspecified: Secondary | ICD-10-CM | POA: Diagnosis present

## 2018-02-23 DIAGNOSIS — I959 Hypotension, unspecified: Secondary | ICD-10-CM | POA: Diagnosis not present

## 2018-02-23 DIAGNOSIS — F809 Developmental disorder of speech and language, unspecified: Secondary | ICD-10-CM | POA: Diagnosis not present

## 2018-02-23 DIAGNOSIS — R451 Restlessness and agitation: Secondary | ICD-10-CM | POA: Diagnosis not present

## 2018-02-23 LAB — URINALYSIS, ROUTINE W REFLEX MICROSCOPIC
Bilirubin Urine: NEGATIVE
GLUCOSE, UA: NEGATIVE mg/dL
Hgb urine dipstick: NEGATIVE
Ketones, ur: NEGATIVE mg/dL
Leukocytes, UA: NEGATIVE
Nitrite: NEGATIVE
PH: 7 (ref 5.0–8.0)
PROTEIN: NEGATIVE mg/dL
Specific Gravity, Urine: 1.003 — ABNORMAL LOW (ref 1.005–1.030)

## 2018-02-23 LAB — CBG MONITORING, ED: Glucose-Capillary: 109 mg/dL — ABNORMAL HIGH (ref 70–99)

## 2018-02-23 LAB — I-STAT CG4 LACTIC ACID, ED
Lactic Acid, Venous: 2.35 mmol/L (ref 0.5–1.9)
Lactic Acid, Venous: 1.59 mmol/L (ref 0.5–1.9)

## 2018-02-23 LAB — CBC
HEMATOCRIT: 38.9 % — AB (ref 39.0–52.0)
Hemoglobin: 12.8 g/dL — ABNORMAL LOW (ref 13.0–17.0)
MCH: 32.9 pg (ref 26.0–34.0)
MCHC: 32.9 g/dL (ref 30.0–36.0)
MCV: 100 fL (ref 78.0–100.0)
PLATELETS: 200 10*3/uL (ref 150–400)
RBC: 3.89 MIL/uL — AB (ref 4.22–5.81)
RDW: 12.9 % (ref 11.5–15.5)
WBC: 5.3 10*3/uL (ref 4.0–10.5)

## 2018-02-23 LAB — I-STAT TROPONIN, ED: TROPONIN I, POC: 0 ng/mL (ref 0.00–0.08)

## 2018-02-23 LAB — BASIC METABOLIC PANEL
Anion gap: 9 (ref 5–15)
BUN: 12 mg/dL (ref 6–20)
CHLORIDE: 104 mmol/L (ref 98–111)
CO2: 25 mmol/L (ref 22–32)
CREATININE: 0.99 mg/dL (ref 0.61–1.24)
Calcium: 8.6 mg/dL — ABNORMAL LOW (ref 8.9–10.3)
GFR calc non Af Amer: >60 mL/min (ref >60)
Glucose, Bld: 121 mg/dL — ABNORMAL HIGH (ref 70–99)
Potassium: 4 mmol/L (ref 3.5–5.1)
Sodium: 138 mmol/L (ref 135–145)

## 2018-02-23 LAB — TSH: TSH: 0.35 u[IU]/mL (ref 0.350–4.500)

## 2018-02-23 LAB — MRSA PCR SCREENING: MRSA by PCR: NEGATIVE

## 2018-02-23 MED ORDER — LEVETIRACETAM 500 MG PO TABS
500.0000 mg | ORAL_TABLET | Freq: Two times a day (BID) | ORAL | Status: DC
  Administered 2018-02-23 – 2018-02-24 (×2): 500 mg via ORAL
  Filled 2018-02-23 (×2): qty 1

## 2018-02-23 MED ORDER — OMEGA-3-ACID ETHYL ESTERS 1 G PO CAPS
1.0000 g | ORAL_CAPSULE | Freq: Every day | ORAL | Status: DC
  Administered 2018-02-24: 1 g via ORAL
  Filled 2018-02-23: qty 1

## 2018-02-23 MED ORDER — VITAMIN B-6 100 MG PO TABS
100.0000 mg | ORAL_TABLET | Freq: Every day | ORAL | Status: DC
  Administered 2018-02-24: 100 mg via ORAL
  Filled 2018-02-23: qty 1

## 2018-02-23 MED ORDER — SODIUM CHLORIDE 0.9 % IV BOLUS
1000.0000 mL | Freq: Once | INTRAVENOUS | Status: AC
  Administered 2018-02-23: 1000 mL via INTRAVENOUS

## 2018-02-23 MED ORDER — SODIUM CHLORIDE 0.9 % IV BOLUS (SEPSIS)
250.0000 mL | Freq: Once | INTRAVENOUS | Status: AC
  Administered 2018-02-23: 250 mL via INTRAVENOUS

## 2018-02-23 MED ORDER — SODIUM CHLORIDE 0.9 % IV SOLN
2.0000 g | Freq: Once | INTRAVENOUS | Status: AC
  Administered 2018-02-23: 2 g via INTRAVENOUS
  Filled 2018-02-23: qty 2

## 2018-02-23 MED ORDER — JOHNSONS BABY SHAMPOO EX SHAM: 1.0000 [oz_av] | MEDICATED_SHAMPOO | Freq: Every day | CUTANEOUS | Status: DC

## 2018-02-23 MED ORDER — LORAZEPAM 2 MG/ML IJ SOLN
INTRAMUSCULAR | Status: AC
  Administered 2018-02-23: 0.5 mg via INTRAVENOUS
  Filled 2018-02-23: qty 1

## 2018-02-23 MED ORDER — VANCOMYCIN HCL IN DEXTROSE 1-5 GM/200ML-% IV SOLN: 1000.0000 mg | Freq: Once | INTRAVENOUS | Status: DC

## 2018-02-23 MED ORDER — SODIUM CHLORIDE 0.9 % IV BOLUS (SEPSIS)
1000.0000 mL | Freq: Once | INTRAVENOUS | Status: AC
  Administered 2018-02-23: 1000 mL via INTRAVENOUS

## 2018-02-23 MED ORDER — ATROPINE SULFATE 1 MG/ML IJ SOLN
INTRAMUSCULAR | Status: AC | PRN
  Administered 2018-02-23: .5 mg via INTRAVENOUS

## 2018-02-23 MED ORDER — LACOSAMIDE 50 MG PO TABS
100.0000 mg | ORAL_TABLET | Freq: Two times a day (BID) | ORAL | Status: DC
  Administered 2018-02-23 – 2018-02-24 (×2): 100 mg via ORAL
  Filled 2018-02-23 (×2): qty 2

## 2018-02-23 MED ORDER — LORAZEPAM 2 MG/ML IJ SOLN
0.5000 mg | Freq: Once | INTRAMUSCULAR | Status: AC
  Administered 2018-02-23: 0.5 mg via INTRAVENOUS

## 2018-02-23 MED ORDER — VANCOMYCIN HCL 10 G IV SOLR
1500.0000 mg | Freq: Once | INTRAVENOUS | Status: AC
  Administered 2018-02-23: 1500 mg via INTRAVENOUS
  Filled 2018-02-23: qty 1500

## 2018-02-23 MED ORDER — SODIUM CHLORIDE 0.9 % IV SOLN
1.0000 g | Freq: Three times a day (TID) | INTRAVENOUS | Status: DC
  Filled 2018-02-23: qty 1

## 2018-02-23 MED ORDER — DIVALPROEX SODIUM 250 MG PO DR TAB: 250.0000 mg | DELAYED_RELEASE_TABLET | Freq: Two times a day (BID) | ORAL | Status: DC

## 2018-02-23 MED ORDER — VITAMIN D 1000 UNITS PO TABS
2000.0000 [IU] | ORAL_TABLET | Freq: Every day | ORAL | Status: DC
  Administered 2018-02-24: 2000 [IU] via ORAL
  Filled 2018-02-23: qty 2

## 2018-02-23 MED ORDER — CALCITONIN (SALMON) 200 UNIT/ACT NA SOLN
1.0000 | Freq: Every day | NASAL | Status: DC
  Filled 2018-02-23 (×2): qty 3.7

## 2018-02-23 MED ORDER — LACTULOSE 10 GM/15ML PO SOLN
10.0000 g | Freq: Every day | ORAL | Status: DC
  Administered 2018-02-24: 10 g via ORAL
  Filled 2018-02-23: qty 15

## 2018-02-23 MED ORDER — LORAZEPAM 2 MG/ML IJ SOLN: 0.5000 mg | Freq: Once | INTRAMUSCULAR | Status: DC | PRN

## 2018-02-23 MED ORDER — ENOXAPARIN SODIUM 40 MG/0.4ML ~~LOC~~ SOLN
40.0000 mg | SUBCUTANEOUS | Status: DC
  Administered 2018-02-24: 40 mg via SUBCUTANEOUS
  Filled 2018-02-23: qty 0.4

## 2018-02-23 MED ORDER — VANCOMYCIN HCL IN DEXTROSE 1-5 GM/200ML-% IV SOLN: 1000.0000 mg | Freq: Two times a day (BID) | INTRAVENOUS | Status: DC

## 2018-02-23 MED ORDER — ACETAMINOPHEN 325 MG PO TABS: 650.0000 mg | ORAL_TABLET | Freq: Four times a day (QID) | ORAL | Status: DC | PRN

## 2018-02-23 MED ORDER — LEVOTHYROXINE SODIUM 100 MCG PO TABS
100.0000 ug | ORAL_TABLET | Freq: Every day | ORAL | Status: DC
  Administered 2018-02-24: 100 ug via ORAL
  Filled 2018-02-23: qty 1

## 2018-02-23 MED ORDER — METRONIDAZOLE IN NACL 5-0.79 MG/ML-% IV SOLN
500.0000 mg | Freq: Three times a day (TID) | INTRAVENOUS | Status: DC
  Administered 2018-02-23: 500 mg via INTRAVENOUS
  Filled 2018-02-23 (×2): qty 100

## 2018-02-23 NOTE — Telephone Encounter (Signed)
I called Dr. Meredith Mody.  We discussed Keppra.  I explained to her that it was my plan to potentially wean the patient off of Keppra at his next office visit which will be in October.  She voiced agreement.

## 2018-02-23 NOTE — ED Notes (Signed)
Pt continues to pull off cardiac leads, BP cuff, and O2 sensor.

## 2018-02-23 NOTE — ED Provider Notes (Addendum)
[facility] EMERGENCY DEPARTMENT Provider Note   CSN: [id number] Arrival date & time: 02/23/18  4098     History   Chief Complaint Chief Complaint  Patient presents with  . Near Syncope    HPI [name] is a 52 y.o. male.  The history is provided by a caregiver and medical records. No language interpreter was used.  Near Syncope      52 year old male with history of Down syndrome, hypothyroidism, seizure disorder, hypertension brought here via EMS from [facility] for evaluation of near syncope.  History obtained through caregiver who is at bedside.  For the past 3 to 4 days, patient's blood pressure has been fluctuating.  This morning, while at breakfast, patient was found slumped over as if he was about to passed out.  They noticed that his heart rate was slow as well as his blood pressure and patient brought here for further evaluation.  Patient is mostly nonverbal.  Caregiver has not noticed any infectious symptoms recently.  No recent medication changes.  He is less active today than normal.  Level 5 caveats  Past Medical History:  Diagnosis Date  . Down's syndrome   . GERD (gastroesophageal reflux disease)   . Hypertension   . Hypothyroid   . Lives in group home    [facility]-[phone]-[phone]-Roxanne-nurse  . Nystagmus   . Obstructive sleep apnea (adult) (pediatric)    does use a cpap at night  . Seizure disorder (HCC) 10/28/2016  . Seizures [facility])     Patient Active Problem List   Diagnosis Date Noted  . Seizure disorder (HCC) 10/28/2016  . Obstructive sleep apnea 07/05/2008  . DOWNS SYNDROME 07/05/2008    Past Surgical History:  Procedure Laterality Date  . IRRIGATION AND DEBRIDEMENT  07/01/2014      . ORIF HUMERUS FRACTURE Left 02/05/2013   Procedure: OPEN REDUCTION INTERNAL FIXATION (ORIF) MEDIAL HUMERUS CONDYLE FRACTURE;  Surgeon: Jodi Marble, MD;  Location: [facility];  Service: Orthopedics;   Laterality: Left;  . Right Elbow surgery          Home Medications    Prior to Admission medications   Medication Sig Start Date End Date Taking? Authorizing Provider  acetaminophen (TYLENOL) 325 MG tablet Take 650 mg by mouth every 6 (six) hours as needed.    [provider]  benazepril (LOTENSIN) 10 MG tablet Take 10 mg by mouth daily.    [provider]  calcitonin, salmon, (MIACALCIN/FORTICAL) 200 UNIT/ACT nasal spray Place 1 spray into alternate nostrils daily.    [provider]  cholecalciferol (VITAMIN D) 1000 units tablet Take 2,000 Units by mouth daily.    [provider]  divalproex (DEPAKOTE) 250 MG DR tablet Take 1 tablet (250 mg total) by mouth 2 (two) times daily. 03/03/17   [name], NP  Infant Care Products (JOHNSONS BABY SHAMPOO) SHAM 1/2 H2O and 1/2 shampoo-scrub eyebrows qd    [provider]  lacosamide (VIMPAT) 50 MG TABS tablet Take 1 tab PO BID for 2 weeks then increase 2 tabs PO BID thereafter. 08/31/17   [name], NP  lactulose (CHRONULAC) 10 GM/15ML solution Take 10 g by mouth daily.    [provider]  levETIRAcetam (KEPPRA) 500 MG tablet Take 1 tablet (500 mg total) by mouth 2 (two) times daily. 03/03/17   [name], NP  levothyroxine (SYNTHROID, LEVOTHROID) 100 MCG tablet Take 100 mcg by mouth daily before breakfast.    [provider]  loperamide (IMODIUM) 2 MG capsule Take by mouth as needed for diarrhea or loose stools.    [provider]  pyridOXINE (VITAMIN B-6) 100 MG tablet Take 100 mg by mouth daily.    [provider]  SUNSCREEN SPF30 EX Apply 1 application topically daily as needed (prevent sunburn).    [provider]  terbinafine (LAMISIL) 1 % cream Apply 1 application topically daily as needed.    [provider]    Family History Family History  Problem Relation Age of Onset  . Dementia Maternal Grandmother   . Diabetes Paternal  Grandfather   . Heart failure Paternal Grandfather   . Hypertension Paternal Grandfather     Social History Social History   Tobacco Use  . Smoking status: Never Smoker  . Smokeless tobacco: Never Used  Substance Use Topics  . Alcohol use: No  . Drug use: No     Allergies   Patient has no known allergies.   Review of Systems Review of Systems  Unable to perform ROS: Patient nonverbal  Cardiovascular: Positive for near-syncope.     Physical Exam Updated Vital Signs BP (!) 98/54   Pulse (!) 50   Temp 97.9 F (36.6 C) (Oral)   Resp 13   Ht 5\' 1"  (1.549 m)   Wt 72.6 kg   SpO2 97%   BMI 30.23 kg/m   Physical Exam  Constitutional:  Patient is ill-appearing  HENT:  Head: Atraumatic.  Mouth is dry  Eyes: Conjunctivae are normal.  Horizontal nystagmus  Neck: Neck supple.  No nuchal rigidity  Cardiovascular:  Bradycardia without murmur rubs or gallop  Pulmonary/Chest:  Shallow breathing with decreased lung sounds but no obvious wheezes, rales, rhonchi  Abdominal: Soft. Bowel sounds are normal. He exhibits no distension. There is no tenderness.  Neurological: He is alert. GCS eye subscore is 4. GCS verbal subscore is 1. GCS motor subscore is 5.  Patient is nonverbal and does follow some commands.  Weakness throughout the entire body, unable to elicit grip strength  Skin: No rash noted.  Psychiatric: He has a normal mood and affect.  Nursing note and vitals reviewed.    ED Treatments / Results  Labs (all labs ordered are listed, but only abnormal results are displayed) Labs Reviewed  BASIC METABOLIC PANEL - Abnormal; Notable for the following components:      Result Value   Glucose, Bld 121 (*)    Calcium 8.6 (*)    All other components within normal limits  CBC - Abnormal; Notable for the following components:   RBC 3.89 (*)    Hemoglobin 12.8 (*)    HCT 38.9 (*)    All other components within normal limits  URINALYSIS, ROUTINE W REFLEX MICROSCOPIC -  Abnormal; Notable for the following components:   Color, Urine COLORLESS (*)    Specific Gravity, Urine 1.003 (*)    All other components within normal limits  CBG MONITORING, ED - Abnormal; Notable for the following components:   Glucose-Capillary 109 (*)    All other components within normal limits  I-STAT CG4 LACTIC ACID, ED - Abnormal; Notable for the following components:   Lactic Acid, Venous 2.35 (*)    All other components within normal limits  CULTURE, BLOOD (ROUTINE X 2)  CULTURE, BLOOD (ROUTINE X 2)  TSH  I-STAT TROPONIN, ED  I-STAT CG4 LACTIC ACID, ED  I-STAT CG4 LACTIC ACID, ED    EKG EKG Interpretation  Date/Time:  Thursday February 23 2018 10:20:51 EDT Ventricular Rate:  51 PR Interval:    QRS Duration: 121 QT Interval:  455 QTC Calculation: 419 R Axis:   -19 Text Interpretation:  Sinus rhythm Right bundle branch block ST elev, probable normal early repol pattern Baseline wander in lead(s) V4 Confirmed by Kennis Carina [id number]) on 02/23/2018 10:34:25 AM   Radiology Dg Chest Portable 1 View  Result Date: 02/23/2018 CLINICAL DATA:  Hypotension. EXAM: PORTABLE CHEST 1 VIEW COMPARISON:  Radiographs of September 23, 2015. FINDINGS: The heart size and mediastinal contours are within normal limits. Both lungs are clear. No pneumothorax or pleural effusion is noted. The visualized skeletal structures are unremarkable. IMPRESSION: No acute cardiopulmonary abnormality seen. Electronically Signed   By: Lupita Raider, M.D.   On: 02/23/2018 10:44    Procedures .Critical Care Performed by: Fayrene Helper, PA-C Authorized by: Fayrene Helper, PA-C   Critical care provider statement:    Critical care time (minutes):  45   Critical care was time spent personally by me on the following activities:  Discussions with consultants, evaluation of patient's response to treatment, examination of patient, ordering and performing treatments and interventions, ordering and review of laboratory  studies, ordering and review of radiographic studies, pulse oximetry, re-evaluation of patient's condition, obtaining history from patient or surrogate and review of old charts   (including critical care time)  Medications Ordered in ED Medications  metroNIDAZOLE (FLAGYL) IVPB 500 mg (0 mg Intravenous Stopped 02/23/18 1219)  vancomycin (VANCOCIN) IVPB 1000 mg/200 mL premix (has no administration in time range)  ceFEPIme (MAXIPIME) 1 g in sodium chloride 0.9 % 100 mL IVPB (has no administration in time range)  sodium chloride 0.9 % bolus 1,000 mL (0 mLs Intravenous Stopped 02/23/18 1118)  atropine injection (0.5 mg Intravenous Given 02/23/18 1021)  sodium chloride 0.9 % bolus 1,000 mL (0 mLs Intravenous Stopped 02/23/18 1234)    And  sodium chloride 0.9 % bolus 250 mL (0 mLs Intravenous Stopped 02/23/18 1253)  ceFEPIme (MAXIPIME) 2 g in sodium chloride 0.9 % 100 mL IVPB (0 g Intravenous Stopped 02/23/18 1234)  vancomycin (VANCOCIN) 1,500 mg in sodium chloride 0.9 % 500 mL IVPB (1,500 mg Intravenous New Bag/Given 02/23/18 1139)  LORazepam (ATIVAN) injection 0.5 mg (0.5 mg Intravenous See Procedure Record 02/23/18 1255)     Initial Impression / Assessment and Plan / ED Course  I have reviewed the triage vital signs and the nursing notes.  Pertinent labs & imaging results that were available during my care of the patient were reviewed by me and considered in my medical decision making (see chart for details).     BP 116/80   Pulse 69   Temp (!) 96.5 F (35.8 C) (Rectal)   Resp 14   Ht 5\' 1"  (1.549 m)   Wt 72.6 kg   SpO2 94%   BMI 30.23 kg/m    Final Clinical Impressions(s) / ED Diagnoses   Final diagnoses:  Symptomatic bradycardia    ED Discharge Orders    None     10:06 AM Patient with history of Down syndrome, nonverbal brought here for evaluation of near syncope.  He was found to be bradycardic with heart rate of 49-50, and mildly hypotensive with blood pressure of 98/54.  He  is afebrile and no recent infectious symptoms.  Work-up initiated, IV fluid given.  Care discussed with Dr. Pilar Plate  10:30 AM On reexamination, patient's heart rate dropped down to the 30s, he became less responsive and blood  pressures systolic in the 80s.  Patient was given 0.5 mL of atropine with immediate response.  Became more alert, heart rates improved to 70-80 and blood pressure improved.  He does have an elevated lactic acid of 2.35.  Given bradycardia, hypotension, elevated lactic acid, will consult cardiology for further evaluation.  Code sepsis also initiated, broad-spectrum antibiotic given.  Hx of hypothyroidism, will check TSH.   11:05 AM Appreciate consultation from Cardiologist who seen and evaluate pt.  Felt they will hold off on implanting pacer at this time.  Sts they will stop clonidine at this time and request medicine admission.    1:02 PM I have the opportunity to talk to patient's mom who is now at bedside.  Mom report patient was recently started on clonidine for agitation while he is on Keppra for his seizure.  Medication was started several days prior.  I suspect the medication may have contributed to his current symptoms.  Repeat lactic acid is 1.59 after receiving IV fluid.  Urine shows no signs of urinary tract infection, chest x-ray unremarkable, code sepsis was canceled.  Will consult medicine for admission  2:09 PM Appreciate consultation from Internal Medicine resident who agrees to see and admit pt for symptomatic anemia.    Fayrene Helper, PA-C 02/23/18 1410    Sabas Sous, MD 02/23/18 1618  Addendum: CC time document for initial code sepsis initiation (tachycardic, hypotensive, elevated lactic acid).  However, no obvious sources of infection were identified.  Pt did required pressure (atropine) for symptomatic hypotension.     Fayrene Helper, PA-C 03/09/18 1511    Sabas Sous, MD 03/13/18 [id number]

## 2018-02-23 NOTE — Consult Note (Addendum)
Cardiology Consultation:   Patient ID: SHION BLUESTEIN MRN: [id number]; DOB: 21-Mar-1966  Admit date: 02/23/2018 Date of Consult: 02/23/2018  Primary Care Provider: Lucretia Field Primary Cardiologist: No primary care provider on file.  Primary Electrophysiologist:  None    Patient Profile:   [name] is a 52 y.o. male with a hx of Down Syndrome and seizure disorder only,  who is being seen today for the evaluation of bradycardia at the request of Dr. Pilar Plate.  History of Present Illness:   PMHx and HPI is obtained by the patient's father at bedside (a retired Radio producer) and his caregiver also at bedside Mr. Bahr resides at a group home, is somewhat verbal with his father and caregiver, though not  Otherwise.  Of late his seizure medicines have been being adjusted, his Depakote decreased 2/2 high ammonia level, keppra added along the way and has been titrated as well with change in behavior with some agitation/agression.  His father reports he was recently started on clonidine for treatment of the behavorial issues by one of his doctors, his fist dose Tuesday, for sure got yesterday and this AM.  Today per the story given to the father from the group home is that while at breakfast he slumped over.  Very lethargic, weak, unclear if he had complete LOC.  EMS was called.  Record reviewed, their presenting vitals SB 54 on EKG w/RBBB, 80/p, 95%, described as alert.  Her in the ER EKG was SB 59, 106/59, while here he was observed to have SB into 30's associated with decreased LOC, the father was present for this, said he was unable to arrouse him with sternal rub, he was given -.5atropine with improvement of HR, BP was 83/47 > last 120/70 HR 60's.  In observation the patient remains sleepy, nods off though wakes to his father and verbal.  They report this as very unusual for the patient, usually very alert, active and interactive though not overtly verbal.    LABS Lactic  acid is high at 2.35 All others are pending  Past Medical History:  Diagnosis Date  . Down's syndrome   . GERD (gastroesophageal reflux disease)   . Hypertension   . Hypothyroid   . Lives in group home    [facility]-[phone]-fax-[phone]-Roxanne-nurse  . Nystagmus   . Obstructive sleep apnea (adult) (pediatric)    does use a cpap at night  . Seizure disorder (HCC) 10/28/2016  . Seizures (HCC)     Past Surgical History:  Procedure Laterality Date  . IRRIGATION AND DEBRIDEMENT  07/01/2014      . ORIF HUMERUS FRACTURE Left 02/05/2013   Procedure: OPEN REDUCTION INTERNAL FIXATION (ORIF) MEDIAL HUMERUS CONDYLE FRACTURE;  Surgeon: Jodi Marble, MD;  Location: [facility];  Service: Orthopedics;  Laterality: Left;  . Right Elbow surgery       Home Medications:  Prior to Admission medications   Medication Sig Start Date End Date Taking? Authorizing Provider  acetaminophen (TYLENOL) 325 MG tablet Take 650 mg by mouth every 6 (six) hours as needed.   Yes [provider]  benazepril (LOTENSIN) 10 MG tablet Take 10 mg by mouth at bedtime.    Yes [provider]  calcitonin, salmon, (MIACALCIN/FORTICAL) 200 UNIT/ACT nasal spray Place 1 spray into alternate nostrils daily.   Yes [provider]  cholecalciferol (VITAMIN D) 1000 units tablet Take 2,000 Units by mouth daily.   Yes [provider]  cloNIDine (CATAPRES) 0.1 MG tablet Take  0.05 mg by mouth 2 (two) times daily.   Yes [provider]  Infant Care Products (JOHNSONS BABY SHAMPOO) SHAM 1/2 H2O and 1/2 shampoo-scrub eyebrows qd   Yes [provider]  lacosamide (VIMPAT) 50 MG TABS tablet Take 1 tab PO BID for 2 weeks then increase 2 tabs PO BID thereafter. Patient taking differently: Take 100 mg by mouth 2 (two) times daily.  08/31/17  Yes [name], NP  lactulose (CHRONULAC) 10 GM/15ML solution Take 10 g by mouth daily.   Yes [provider]    levETIRAcetam (KEPPRA) 500 MG tablet Take 1 tablet (500 mg total) by mouth 2 (two) times daily. 03/03/17  Yes [name], NP  levothyroxine (SYNTHROID, LEVOTHROID) 100 MCG tablet Take 100 mcg by mouth daily before breakfast.   Yes [provider]  loperamide (IMODIUM) 2 MG capsule Take by mouth as needed for diarrhea or loose stools.   Yes [provider]  omega-3 acid ethyl esters (LOVAZA) 1 g capsule Take 1 g by mouth daily.   Yes [provider]  pyridOXINE (VITAMIN B-6) 100 MG tablet Take 100 mg by mouth daily.   Yes [provider]  SUNSCREEN SPF30 EX Apply 1 application topically daily as needed (prevent sunburn).   Yes [provider]  terbinafine (LAMISIL) 1 % cream Apply 1 application topically daily as needed.   Yes [provider]  divalproex (DEPAKOTE) 250 MG DR tablet Take 1 tablet (250 mg total) by mouth 2 (two) times daily. Patient not taking: Reported on 02/23/2018 03/03/17   [name], NP    Inpatient Medications: Scheduled Meds:  Continuous Infusions: . ceFEPime (MAXIPIME) IV 2 g (02/23/18 1113)  . metronidazole    . sodium chloride     And  . sodium chloride    . vancomycin     PRN Meds:   Allergies:   No Known Allergies  Social History:   Social History   Socioeconomic History  . Marital status: Single    Spouse name: Not on file  . Number of children: 0  . Years of education: 77  . Highest education level: Not on file  Occupational History  . 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 Financial resource strain: Not on file  . Food insecurity:    Worry: Not on file    Inability: Not on file  . Transportation needs:    Medical: Not on file    Non-medical: Not on file  Tobacco Use  . Smoking status: Never Smoker  . Smokeless tobacco: Never Used  Substance and Sexual Activity  . Alcohol use: No  . Drug use: No  . Sexual activity: Not on file  Lifestyle  . Physical activity:    Days per  week: Not on file    Minutes per session: Not on file  . Stress: Not on file  Relationships  . Social connections:    Talks on phone: Not on file    Gets together: Not on file    Attends religious service: Not on file    Active member of club or organization: Not on file    Attends meetings of clubs or organizations: Not on file    Relationship status: Not on file  . Intimate partner violence:    Fear of current or ex partner: Not on file    Emotionally abused: Not on file    Physically abused: Not on file    Forced sexual activity: Not on file  Other Topics Concern  . Not on file  Social History Narrative   Lives at a group home   Caffeine use: Tea/soda once per week   Right handed    Family History:   Family History  Problem Relation Age of Onset  . Dementia Maternal Grandmother   . Diabetes Paternal Grandfather   . Heart failure Paternal Grandfather   . Hypertension Paternal Grandfather      ROS:  Please see the history of present illness.  All other ROS reviewed and negative.     Physical Exam/Data:   Vitals:   02/23/18 1000 02/23/18 1015 02/23/18 1030 02/23/18 1045  BP: (!) 93/50 (!) 83/47 107/71 120/72  Pulse: (!) 49 (!) 42 65 65  Resp: 14 12 10 12   Temp:      TempSrc:      SpO2: 96% 94% 99% 96%  Weight:      Height:       No intake or output data in the 24 hours ending 02/23/18 1114 Filed Weights   02/23/18 0940  Weight: 72.6 kg   Body mass index is 30.23 kg/m.  General:  Well nourished, well developed, in no acute distress, appears sleepy/drowsy, Down's facies HEENT: normal Lymph: no adenopathy Neck: no JVD Endocrine:  No thryomegaly Vascular: No carotid bruits  Cardiac:  RRR; no murmurs, gallops or rubs Lungs:  CTA b/l, no wheezing, rhonchi or rales  Abd: soft, nontender  Ext: no edema Musculoskeletal:  No deformities Skin: warm and dry  Neuro:  Difficult to assess, though no gross deficits are appreciated Psych:  Difficult to assess    EKG:  The EKG was personally reviewed and demonstrates:   SB 59bpm, RBBB SB 51bpm, RBBB Telemetry:  Telemetry was personally reviewed and demonstrates:   SB/SR 50-60's, period of  SB 30's noted, no block  Relevant CV Studies:  EKG from 2017 available, RBBB noted, no other historical cardiac data  Laboratory Data:  ChemistryNo results for input(s): NA, K, CL, CO2, GLUCOSE, BUN, CREATININE, CALCIUM, GFRNONAA, GFRAA, ANIONGAP in the last 168 hours.  No results for input(s): PROT, ALBUMIN, AST, ALT, ALKPHOS, BILITOT in the last 168 hours. Hematology Recent Labs  Lab 02/23/18 0945  WBC 5.3  RBC 3.89*  HGB 12.8*  HCT 38.9*  MCV 100.0  MCH 32.9  MCHC 32.9  RDW 12.9  PLT 200   Cardiac EnzymesNo results for input(s): TROPONINI in the last 168 hours.  Recent Labs  Lab 02/23/18 1034  TROPIPOC 0.00    BNPNo results for input(s): BNP, PROBNP in the last 168 hours.  DDimer No results for input(s): DDIMER in the last 168 hours.  Radiology/Studies:   Dg Chest Portable 1 View Result Date: 02/23/2018 CLINICAL DATA:  Hypotension. EXAM: PORTABLE CHEST 1 VIEW COMPARISON:  Radiographs of September 23, 2015. FINDINGS: The heart size and mediastinal contours are within normal limits. Both lungs are clear. No pneumothorax or pleural effusion is noted. The visualized skeletal structures are unremarkable. IMPRESSION: No acute cardiopulmonary abnormality seen. Electronically Signed   By: Lupita Raider, M.D.   On: 02/23/2018 10:44    Assessment and Plan:   1. Bradycardia     He may need pacing, Dr. Elberta Fortis discussed with the patient's father.  He was started on clonidine Tuesday, the father/caregiver both report some degree lethargy since then and would like to try off it given the timing prior to pursuing PPM.  His labs are pending, lactic acid is high, w/u for possible sepsis  is underway in the ER He remains sleepy/drowsy, unusual for the patient, despite SR 60's and good BP.    Recommend  medicine admission, step down bed status, we Mialani Reicks follow Stop clonidine Keep zoll pads on   For questions or updates, please contact [facility] Please consult [email or web address] for contact info under     Signed, Sheilah Pigeon, PA-C  02/23/2018 11:14 AM  I have seen and examined this patient with Francis Dowse.  Agree with above, note added to reflect my findings.  On exam, RRR, no murmurs, lungs clear.  Presented to the hospital after a witnessed syncopal episode.  He has Down syndrome and is mostly nonverbal.  While in the emergency room, he became bradycardic into the 30s and became less responsive.  He responded to atropine.  He has recently been put on clonidine to help with agitation.  This occurred 2 days prior to his episode.  He also has an elevated lactate.  At this point, would stop his clonidine and see overnight if he has any further bradycardia.  Internal medicine to work-up elevated lactate.  Rasul Decola M. Hayat Warbington MD 02/23/2018 11:44 AM

## 2018-02-23 NOTE — Progress Notes (Signed)
Pharmacy Antibiotic Note  [name] is a 52 y.o. male admitted on 02/23/2018 with sepsis.  Pharmacy has been consulted for vancomycin and cefepime dosing. Pt is afebrile and WBC is WNL. Lactic acid is >2. SCr is 0.99.   Plan: Vancomycin 1500mg  IV x 1 then 1gm IV Q12H Cefepime 2gm IV x 1 then 1gm IV Q8H F/u renal fxn, C&S, clinical status and trough at SS  Height: 5\' 1"  (154.9 cm) Weight: 160 lb (72.6 kg) IBW/kg (Calculated) : 52.3  Temp (24hrs), Avg:97.7 F (36.5 C), Min:97.4 F (36.3 C), Max:97.9 F (36.6 C)  Recent Labs  Lab 02/23/18 1021  LATICACIDVEN 2.35*    CrCl cannot be calculated (Patient's most recent lab result is older than the maximum 21 days allowed.).    No Known Allergies  Antimicrobials this admission: Vanc 9/26>> Cefepime 9/6>> Flagyl 9/26>>  Dose adjustments this admission: N/A  Microbiology results: Pending  Thank you for allowing pharmacy to be a part of this patient's care.  Damonta Cossey, Drake Leach 02/23/2018 10:31 AM

## 2018-02-23 NOTE — H&P (Signed)
Date: 02/23/2018               Patient Name:  [name] MRN: [id number]  DOB: 1966/03/01 Age / Sex: 52 y.o., male   PCP: Lucretia Field         Medical Service: Internal Medicine Teaching Service         Attending Physician: Dr. Oswaldo Done, Marquita Palms, *    First Contact: Dr. Avie Arenas Pager: [phone]  Second Contact: Dr. Crista Elliot Pager: [phone]       After Hours (After 5p/  First Contact Pager: [phone]  weekends / holidays): Second Contact Pager: [phone]   Chief Complaint: sycope  History of Present Illness: [name] is a 52 yo male with a medical history of Down's syndrome, HTN, hypothyroidism, OSA, and seizure disorder who presented to the ED after a syncopal episode that occurred this morning. Omkar is accompanied by his father and group home staff who provide the history. At his baseline, [name] is alert, interactive, and practically nonverbal, although he is able to communicate meaningfully with family. [name] lives in a group home, and this morning he was witnessed to slump over at the breakfast table and be unresponsive. EMS was called and his vitals at the scene were HR of 54 with hypotension to the 80s systolic. The patient was more alert when EMS arrived. After arriving to the emergency department, he again became unresponsive with bradycardia to the 30s and systolic pressures in the 80s. He was given 0.5 mg atropine with immediate HR and BP improvement. [name] has a history of falls, fractures, and seizures, but he has no history of syncope or bradycardia. He has a history of HTN, but no history of arrhythmias or heart disease. He appeared to be in his normal state of health before the episode. [name] was started on clonidine 3 days ago for aggressive behavior. His father reports that he was started on Keppra for seizures in February. Since that time, his behavior has become more agitated and aggressive. Before then, he had never exhibited  aggression. [name] has not had a seizure since February.   EKG in the ED showed sinus bradycardia with RBBB. Labs significant for lactic acid of 2.35 (decreased to 1.59), TSH wnl, negative troponin. UA unremarkable. CXR unremarkable. He received 2.25L NS, cefepime, flagyl, and vancomycin. He received ativan for agitation with pulling out his IV and pulling off his telemetry leads.  Meds:  Current Meds  Medication Sig  . acetaminophen (TYLENOL) 325 MG tablet Take 650 mg by mouth every 6 (six) hours as needed.  . benazepril (LOTENSIN) 10 MG tablet Take 10 mg by mouth at bedtime.   . calcitonin, salmon, (MIACALCIN/FORTICAL) 200 UNIT/ACT nasal spray Place 1 spray into alternate nostrils daily.  . cholecalciferol (VITAMIN D) 1000 units tablet Take 2,000 Units by mouth daily.  . cloNIDine (CATAPRES) 0.1 MG tablet Take 0.05 mg by mouth 2 (two) times daily.  Thornell [employer] Care Products (JOHNSONS BABY SHAMPOO) SHAM 1/2 H2O and 1/2 shampoo-scrub eyebrows qd  . lacosamide (VIMPAT) 50 MG TABS tablet Take 1 tab PO BID for 2 weeks then increase 2 tabs PO BID thereafter. (Patient taking differently: Take 100 mg by mouth 2 (two) times daily. )  . lactulose (CHRONULAC) 10 GM/15ML solution Take 10 g by mouth daily.  Marland Kitchen levETIRAcetam (KEPPRA) 500 MG tablet Take 1 tablet (500 mg total) by mouth 2 (two) times daily.  Marland Kitchen levothyroxine (SYNTHROID, LEVOTHROID) 100 MCG tablet Take 100 mcg by  mouth daily before breakfast.  . loperamide (IMODIUM) 2 MG capsule Take by mouth as needed for diarrhea or loose stools.  Marland Kitchen omega-3 acid ethyl esters (LOVAZA) 1 g capsule Take 1 g by mouth daily.  Marland Kitchen pyridOXINE (VITAMIN B-6) 100 MG tablet Take 100 mg by mouth daily.  . SUNSCREEN SPF30 EX Apply 1 application topically daily as needed (prevent sunburn).  . terbinafine (LAMISIL) 1 % cream Apply 1 application topically daily as needed.   Allergies: Allergies as of 02/23/2018  . (No Known Allergies)   Past Medical History:  Diagnosis  Date  . Down's syndrome   . GERD (gastroesophageal reflux disease)   . Hypertension   . Hypothyroid   . Lives in group home    [phone]-[phone]-Roxanne-nurse  . Nystagmus   . Obstructive sleep apnea (adult) (pediatric)    does use a cpap at night  . Seizure disorder (HCC) 10/28/2016  . Seizures (HCC)    Past Surgical History:  Procedure Laterality Date  . IRRIGATION AND DEBRIDEMENT  07/01/2014      . ORIF HUMERUS FRACTURE Left 02/05/2013   Procedure: OPEN REDUCTION INTERNAL FIXATION (ORIF) MEDIAL HUMERUS CONDYLE FRACTURE;  Surgeon: Jodi Marble, MD;  Location: [facility];  Service: Orthopedics;  Laterality: Left;  . Right Elbow surgery      Family History: No history of arrhythmias or young SCD.  Social History: Patient has lived in his group home for decades. He is nonverbal except for a few words like "yes" and "no." He is able to communicate more meaningfully with his family. He enjoys listening to music and dancing.   Review of Systems: A complete ROS was negative except as per HPI.   Physical Exam: Blood pressure (!) 144/69, pulse (!) 49, temperature (!) 96.5 F (35.8 C), temperature source Rectal, resp. rate (!) 22, height 5\' 1"  (1.549 m), weight 72.6 kg, SpO2 98 %.  Constitutional: Gentleman with down's syndrome. Nonverbal except for "yes" and "no" with the examiner. Eyes: Nystagmus (present for life) Cardiovascular: Normal rate and regular rhythm. No murmurs, rubs, or gallops. Pulmonary/Chest: Effort normal. Clear to auscultation bilaterally. No wheezes, rales, or rhonchi. Abdominal: Bowel sounds present. Soft, non-distended, non-tender. Ext: No lower extremity edema. Skin: Warm and dry. No rashes or wounds.  EKG: personally reviewed my interpretation is sinus bradycardia and RBBB.   CXR: personally reviewed my interpretation is no acute findings.  Assessment & Plan by Problem: Active Problems:   Syncope  Saqib Cline is a 52 yo male with a medical history of Down's syndrome, HTN, hypothyroidism, OSA, and seizure disorder who presented after a syncopal episode, bradycardia to the 30s, and   Syncope Symptomatic Bradycardia and Hypotension - Mr. Bulnes's syncopal episode was most likely caused by bradycardia, with heart rates as low as the 30s. He responded well to atropine. His bradycardia and hypotension are most likely side effects of the clonidine, which was recently started. His EKGs showed sinus bradycardia, but the differential also includes other arrhythmias causing bradycardia. The patient was evaluated by EP in the ED. They also reviewed his telemetry and saw no evidence of heart block. EP would like to try a trial off of clonidine before considering pacemaker placement. - Low suspicion for other causes of syncope including PE, MI, or seizure. - Low suspicion for infection given that Mr. Ahlberg is afebrile, without leukocytosis, has unremarkable CXR and UA. Elevated lactate is likely due to hypoperfusion in the setting of bradycardia and hypotension. Plan -  Tele - Hold clonidine - Fall precautions - Orthostatic vitals  Agitation - Bosten's aggressive behavior only began after starting Keppra in February. This could be a side effect of the Keppra itself. Consider switching anti-epileptic regimen to see if behavioral issues resolve.  - Patient has been pulling at lines in the ED, however it is vital for him to be monitored by telemetry. Family agrees to stay at bedside throughout the night to help orient and calm the patient.  Plan - ativan 0.5 mg PRN once for agitation   Seizure disorder - Continue home vimpat 100 mg BID and keppra 500mg  BID - Seizure precautions  Hypothyroidism - Continue home synthroid at 100 mcg  History of elevated ammonia - No history of liver disease Plan - Continue home lactulose 10g daily  FEN: no IV fluids, heart healthy diet, replace electrolytes as  needed  DVT ppx: lovenox Code status: FULL code  Dispo: Admit patient to Inpatient with expected length of stay greater than 2 midnights.  Signed: Dionne Ano, MD 02/23/2018, 6:49 PM  Pager: [phone]

## 2018-02-23 NOTE — ED Triage Notes (Signed)
Per EMS pt was at [facility] and while getting him dressed they noticed that he was slumped over to his side.  They took a BP and it was in the 80s systolic.  They called EMS and the pt became more alert as time went on.  Pt is non verbal at baseline and does have down syndrome.  EMS states pt is at baseline currently.  He has not answered questions but is calm and cooperative.  NAD noted at this time.

## 2018-02-23 NOTE — Plan of Care (Signed)
Cardiac Monitor. Assess for symptomatic bradycardia. Code Sepsis. Monitor labs, VSS, and LOC.

## 2018-02-24 ENCOUNTER — Inpatient Hospital Stay (HOSPITAL_COMMUNITY): Payer: Medicare Other

## 2018-02-24 DIAGNOSIS — I959 Hypotension, unspecified: Secondary | ICD-10-CM

## 2018-02-24 DIAGNOSIS — I4510 Unspecified right bundle-branch block: Secondary | ICD-10-CM

## 2018-02-24 DIAGNOSIS — I5030 Unspecified diastolic (congestive) heart failure: Secondary | ICD-10-CM | POA: Diagnosis not present

## 2018-02-24 DIAGNOSIS — T50905A Adverse effect of unspecified drugs, medicaments and biological substances, initial encounter: Secondary | ICD-10-CM

## 2018-02-24 DIAGNOSIS — Z79899 Other long term (current) drug therapy: Secondary | ICD-10-CM

## 2018-02-24 DIAGNOSIS — Z79890 Hormone replacement therapy (postmenopausal): Secondary | ICD-10-CM

## 2018-02-24 DIAGNOSIS — R451 Restlessness and agitation: Secondary | ICD-10-CM

## 2018-02-24 DIAGNOSIS — E039 Hypothyroidism, unspecified: Secondary | ICD-10-CM | POA: Diagnosis not present

## 2018-02-24 DIAGNOSIS — R55 Syncope and collapse: Secondary | ICD-10-CM | POA: Diagnosis not present

## 2018-02-24 DIAGNOSIS — G4733 Obstructive sleep apnea (adult) (pediatric): Secondary | ICD-10-CM | POA: Diagnosis not present

## 2018-02-24 DIAGNOSIS — Q909 Down syndrome, unspecified: Secondary | ICD-10-CM | POA: Diagnosis not present

## 2018-02-24 DIAGNOSIS — Z9181 History of falling: Secondary | ICD-10-CM

## 2018-02-24 DIAGNOSIS — I10 Essential (primary) hypertension: Secondary | ICD-10-CM | POA: Diagnosis not present

## 2018-02-24 DIAGNOSIS — G40909 Epilepsy, unspecified, not intractable, without status epilepticus: Secondary | ICD-10-CM

## 2018-02-24 DIAGNOSIS — R001 Bradycardia, unspecified: Secondary | ICD-10-CM | POA: Diagnosis present

## 2018-02-24 DIAGNOSIS — F809 Developmental disorder of speech and language, unspecified: Secondary | ICD-10-CM

## 2018-02-24 DIAGNOSIS — Z87311 Personal history of (healed) other pathological fracture: Secondary | ICD-10-CM

## 2018-02-24 LAB — ECHOCARDIOGRAM COMPLETE
Height: 61 in
WEIGHTICAEL: 2641.6 [oz_av]

## 2018-02-24 LAB — HIV ANTIBODY (ROUTINE TESTING W REFLEX): HIV SCREEN 4TH GENERATION: NONREACTIVE

## 2018-02-24 MED ORDER — LACOSAMIDE 100 MG PO TABS: 100.0000 mg | ORAL_TABLET | Freq: Two times a day (BID) | ORAL | 0 refills | Status: DC

## 2018-02-24 MED FILL — Medication: Qty: 1 | Status: AC

## 2018-02-24 NOTE — Evaluation (Signed)
Physical Therapy Evaluation Patient Details Name: [name] MRN: [id number] DOB: [birth year] Today's Date: 02/24/2018   History of Present Illness  [name] is a 52 yo male with a medical history of Down's syndrome, HTN, hypothyroidism, OSA, basically non-verbal, and seizure disorder who presented to the ED after a syncopal episode that occurred this morning slumping over at breakfast  Clinical Impression  Pt admitted with above diagnosis. Pt currently with functional limitations due to the deficits listed below (see PT Problem List). Pt was able to ambulate with min assist and parents state pt is at baseline gait with deviations PTA.  Will follow acutely while in hospital to ensure pt is mobilizing.   Pt will benefit from skilled PT to increase their independence and safety with mobility to allow discharge to the venue listed below.      Follow Up Recommendations No PT follow up;Supervision - Intermittent    Equipment Recommendations  None recommended by PT    Recommendations for Other Services       Precautions / Restrictions Precautions Precautions: Fall Restrictions Weight Bearing Restrictions: No Other Position/Activity Restrictions: Per dad, broken left pinky finger and recent hip was bothering pt (right hip)      Mobility  Bed Mobility               General bed mobility comments: Pt in recliner upon entry  Transfers Overall transfer level: Needs assistance Equipment used: 1 person hand held assist Transfers: Sit to/from Stand Sit to Stand: Min guard         General transfer comment: min guard assist to stand from chair.    Ambulation/Gait Ambulation/Gait assistance: Min assist;+2 safety/equipment Gait Distance (Feet): 250 Feet Assistive device: 1 person hand held assist;2 person hand held assist Gait Pattern/deviations: Decreased step length - left;Decreased stance time - left;Decreased weight shift to left;Decreased dorsiflexion -  right;Decreased dorsiflexion - left;Shuffle;Festinating;Ataxic;Trunk flexed   Gait velocity interpretation: 1.31 - 2.62 ft/sec, indicative of limited community ambulator General Gait Details: Min A for ambulation with pt having a forward lean posture and increase speed as he ambulates (his baseline per parents in room).  Pt ambulating with PT/OT with bil HHA for stability.  Pt has frequent falls per father.  He has been at group home and parents want him to return there at d/c as pt if familiar and they provide the care that pt needs. Pt appears to be at baseline per parents with gait deviations at baseline.   Stairs            Wheelchair Mobility    Modified Rankin (Stroke Patients Only)       Balance Overall balance assessment: Needs assistance Sitting-balance support: No upper extremity supported;Feet supported Sitting balance-Leahy Scale: Good     Standing balance support: No upper extremity supported;During functional activity Standing balance-Leahy Scale: Fair Standing balance comment: standing at sink to wash hands                             Pertinent Vitals/Pain Pain Assessment: No/denies pain Faces Pain Scale: No hurt    Home Living Family/patient expects to be discharged to:: Group home   Available Help at Discharge: Available PRN/intermittently Type of Home: House Home Access: Ramped entrance     Home Layout: One level Home Equipment: None Additional Comments: All group homes go to a center during day - ride a Zenaida Niece    Prior Function Level of Independence:  Independent         Comments: Dresses self with set up but needs some help with shoes, A with showering     Hand Dominance   Dominant Hand: Right(but can use left as well due to PHMx; of right broken elbow)    Extremity/Trunk Assessment   Upper Extremity Assessment Upper Extremity Assessment: Defer to OT evaluation    Lower Extremity Assessment Lower Extremity Assessment:  Generalized weakness    Cervical / Trunk Assessment Cervical / Trunk Assessment: Kyphotic  Communication   Communication: Other (comment)(bascially non-verbal)  Cognition Arousal/Alertness: Awake/alert Behavior During Therapy: WFL for tasks assessed/performed Overall Cognitive Status: History of cognitive impairments - at baseline                                        General Comments      Exercises     Assessment/Plan    PT Assessment Patient needs continued PT services  PT Problem List Decreased activity tolerance;Decreased balance;Decreased mobility;Decreased knowledge of use of DME;Decreased safety awareness;Decreased cognition       PT Treatment Interventions DME instruction;Gait training;Functional mobility training;Therapeutic activities;Therapeutic exercise;Balance training;Patient/family education    PT Goals (Current goals can be found in the Care Plan section)  Acute Rehab PT Goals Patient Stated Goal: family reports pt probably back to group home tomorrow PT Goal Formulation: With family Time For Goal Achievement: 03/10/18 Potential to Achieve Goals: Good    Frequency Min 3X/week   Barriers to discharge        Co-evaluation PT/OT/SLP Co-Evaluation/Treatment: Yes Reason for Co-Treatment: For patient/therapist safety PT goals addressed during session: Mobility/safety with mobility OT goals addressed during session: ADL's and self-care       AM-PAC PT "6 Clicks" Daily Activity  Outcome Measure Difficulty turning over in bed (including adjusting bedclothes, sheets and blankets)?: None Difficulty moving from lying on back to sitting on the side of the bed? : None Difficulty sitting down on and standing up from a chair with arms (e.g., wheelchair, bedside commode, etc,.)?: None Help needed moving to and from a bed to chair (including a wheelchair)?: A Little Help needed walking in hospital room?: A Little Help needed climbing 3-5 steps  with a railing? : A Lot 6 Click Score: 20    End of Session Equipment Utilized During Treatment: Gait belt Activity Tolerance: Patient limited by fatigue;Patient tolerated treatment well Patient left: in chair;with call bell/phone within reach;with family/visitor present Nurse Communication: Mobility status PT Visit Diagnosis: Unsteadiness on feet (R26.81);Muscle weakness (generalized) (M62.81);Ataxic gait (R26.0)    Time: 1610-9604 PT Time Calculation (min) (ACUTE ONLY): 33 min   Charges:   PT Evaluation $PT Eval Moderate Complexity: 1 Mod          Pharaoh Pio,PT Acute Rehabilitation Services Pager:  [phone]  Office:  [phone]    Berline Lopes 02/24/2018, 12:10 PM

## 2018-02-24 NOTE — Clinical Social Work Note (Signed)
Clinical Social Work Assessment  Patient Details  Name: [name] MRN: [id number] Date of Birth: 08-May-1966  Date of referral:  02/24/18               Reason for consult:  Facility Placement                Permission sought to share information with:  Oceanographer granted to share information::  Yes, Verbal Permission Granted  Name::        Agency::  [facility]  Relationship::     Contact Information:     Housing/Transportation Living arrangements for the past 2 months:  Group Home Source of Information:  Patient, Medical Team, Facility Patient Interpreter Needed:  None Criminal Activity/Legal Involvement Pertinent to Current Situation/Hospitalization:  No - Comment as needed Significant Relationships:  Siblings, Parents Lives with:  Self, Facility Resident Do you feel safe going back to the place where you live?  Yes Need for family participation in patient care:  Yes (Comment)  Care giving concerns:  Patient from group home and has no concerns about care received.   Social Worker assessment / plan:  CSW coordinated patient's return to group home.  Employment status:  Disabled (Comment on whether or not currently receiving Disability) Insurance information:  Medicare PT Recommendations:  No Follow Up Information / Referral to community resources:     Patient/Family's Response to care:  Patient and family agreeable to return to group home.  Patient/Family's Understanding of and Emotional Response to Diagnosis, Current Treatment, and Prognosis:  Patient and family appreciative of care received at the hospital and of CSW assistance in returning patient to his group home.  Emotional Assessment Appearance:  Appears stated age Attitude/Demeanor/Rapport:  Unable to Assess Affect (typically observed):  Unable to Assess Orientation:    Alcohol / Substance use:  Not Applicable Psych involvement (Current and /or in the community):  No  (Comment)  Discharge Needs  Concerns to be addressed:  Care Coordination Readmission within the last 30 days:  No Current discharge risk:  None Barriers to Discharge:  No Barriers Identified   Baldemar Lenis, LCSW 02/24/2018, 2:44 PM

## 2018-02-24 NOTE — Discharge Summary (Signed)
Name: [name] MRN: [id number] DOB: Feb 05, 1966 52 y.o. PCP: Lucretia Field  Date of Admission: 02/23/2018  9:23 AM Date of Discharge: 02/24/18 Attending Physician: Tyson Alias, *  Discharge Diagnosis: 1. Symptomatic bradycardia and hypotension  Discharge Medications: Allergies as of 02/24/2018   No Known Allergies     Medication List    STOP taking these medications   cloNIDine 0.1 MG tablet Commonly known as:  CATAPRES     TAKE these medications   acetaminophen 325 MG tablet Commonly known as:  TYLENOL Take 650 mg by mouth every 6 (six) hours as needed.   benazepril 10 MG tablet Commonly known as:  LOTENSIN Take 10 mg by mouth at bedtime.   calcitonin (salmon) 200 UNIT/ACT nasal spray Commonly known as:  MIACALCIN/FORTICAL Place 1 spray into alternate nostrils daily.   cholecalciferol 1000 units tablet Commonly known as:  VITAMIN D Take 2,000 Units by mouth daily.   divalproex 250 MG DR tablet Commonly known as:  DEPAKOTE Take 1 tablet (250 mg total) by mouth 2 (two) times daily.   JOHNSONS BABY SHAMPOO Sham 1/2 H2O and 1/2 shampoo-scrub eyebrows qd   Lacosamide 100 MG Tabs Take 1 tablet (100 mg total) by mouth 2 (two) times daily. What changed:    medication strength  how much to take  how to take this  when to take this  additional instructions   lactulose 10 GM/15ML solution Commonly known as:  CHRONULAC Take 10 g by mouth daily.   levETIRAcetam 500 MG tablet Commonly known as:  KEPPRA Take 1 tablet (500 mg total) by mouth 2 (two) times daily.   levothyroxine 100 MCG tablet Commonly known as:  SYNTHROID, LEVOTHROID Take 100 mcg by mouth daily before breakfast.   loperamide 2 MG capsule Commonly known as:  IMODIUM Take by mouth as needed for diarrhea or loose stools.   omega-3 acid ethyl esters 1 g capsule Commonly known as:  LOVAZA Take 1 g by mouth daily.   pyridOXINE 100 MG tablet Commonly known as:   VITAMIN B-6 Take 100 mg by mouth daily.   SUNSCREEN SPF30 EX Apply 1 application topically daily as needed (prevent sunburn).   terbinafine 1 % cream Commonly known as:  LAMISIL Apply 1 application topically daily as needed.       Disposition and follow-up:   [name] was discharged from [facility] in Good condition.  At the hospital follow up visit please address:  1.  Symptomatic bradycardia and hypotension secondary to clonidine: patient was admitted after a syncopal episode with bradycardia in the 30s. Bradycardia most likely related to clonidine. Did not require pacemaker placement during admission. Bradycardia and hypotension resolved after discontinuing clonidine. The patient should avoid clonidine or other bradycardia-inducing medications in the future.   2.  Labs / imaging needed at time of follow-up: None  3.  Pending labs/ test needing follow-up: None  Follow-up Appointments: Follow-up Information    [name], [name] Follow up in 1 week(s).   Specialty:  Family Medicine Contact information: [address] [place] [address] [phone]           Hospital Course by problem list: 1. Symptomatic bradycardia and hypotension secondary to clonidine: [name] is a 52 yo male with a medical history of Down's syndrome, HTN, hypothyroidism, OSA, and seizure disorder who presented after a syncopal episode, bradycardia to the 30s, and hypotension to the 80s systolic. He had one more episode of syncope  with bradycardia to the 30s for which he received atropine, with good response in HR and BP. His EKGs showed sinus bradycardia without heart block. His bradycardia was most likely caused by clonidine, a medication he had started earlier the week of admission for aggression. The patient was placed on telemetry and observed overnight. He had bradycardia in the 50s while sleeping, but he was asymptomatic. NSR and sinus bradycardia were  noted, but no other arrhythmias. ECHO did not reveal any structural abnormalities. He was evaluated by electrophysiology who agreed that [name] was medically appropriate for discharge back to his group home. He should avoid clonidine and other bradycardia-inducing medications from here on out. No other changes to his medications were made.   Discharge Vitals:   BP 136/80   Pulse 66   Temp 98 F (36.7 C) (Oral)   Resp 19   Ht 5\' 1"  (1.549 m)   Wt 74.9 kg   SpO2 98%   BMI 31.20 kg/m   Pertinent Labs, Studies, and Procedures:  CBC Latest Ref Rng & Units 02/23/2018 08/30/2017 03/01/2017  WBC 4.0 - 10.5 K/uL 5.3 5.3 5.3  Hemoglobin 13.0 - 17.0 g/dL 12.8(L) 13.6 13.6  Hematocrit 39.0 - 52.0 % 38.9(L) 40.0 39.7  Platelets 150 - 400 K/uL 200 255 265   CMP Latest Ref Rng & Units 02/23/2018 08/30/2017 03/01/2017  Glucose 70 - 99 mg/dL 161(W) 99 960(A)  BUN 6 - 20 mg/dL 12 14 13   Creatinine 0.61 - 1.24 mg/dL 5.40 9.81(X) 9.14  Sodium 135 - 145 mmol/L 138 140 140  Potassium 3.5 - 5.1 mmol/L 4.0 4.6 4.5  Chloride 98 - 111 mmol/L 104 100 100  CO2 22 - 32 mmol/L 25 23 28   Calcium 8.9 - 10.3 mg/dL 7.8(G) 9.1 9.0  Total Protein 6.0 - 8.5 g/dL - 6.9 6.8  Total Bilirubin 0.0 - 1.2 mg/dL - 0.2 0.2  Alkaline Phos 39 - 117 IU/L - 72 61  AST 0 - 40 IU/L - 17 18  ALT 0 - 44 IU/L - 12 10   ECHO: - Left ventricle: The cavity size was normal. Wall thickness was   normal. Systolic function was normal. The estimated ejection   fraction was in the range of 60% to 65%. Wall motion was normal;   there were no regional wall motion abnormalities. Doppler   parameters are consistent with abnormal left ventricular   relaxation (grade 1 diastolic dysfunction). - Aortic valve: There was trivial regurgitation.  Discharge Instructions: Discharge Instructions    Discharge instructions   Complete by:  As directed    It was a pleasure taking care of [name]!  1. He was treated for symptomatic bradycardia (low  heart rate), which was most likely caused by his clonidine medication. Low heart rate is a common side effect of clonidine. After stopping the clonidine, he did not have any future episodes of low heart rate, low blood pressure, or fainting. [name] should stop taking clonidine.  2. [name] should follow up with his PCP and neurologist as scheduled.  3. [name] should return to the hospital if he has future episodes of fainting, unresponsiveness, or recorded low heart rates or low blood pressures.   Feel free to call our clinic at [phone] if you have any questions.  Thanks! [name]   Increase activity slowly   Complete by:  As directed       Signed: Dorrell, Cathleen Corti, MD 02/24/2018, 2:19 PM   Pager: [phone]

## 2018-02-24 NOTE — Progress Notes (Signed)
   Subjective: No overnight events. Telemetry showed normal sinus rhythm and bradycardia to the 50s while the patient was sleeping and asymptomatic. He has had no further episodes of syncope or unresponsiveness. He slept well and was able to ambulate and eat without issue. His mother was present at bedside today and reports that he seems back to normal.   Objective:  Vital signs in last 24 hours: Vitals:   02/23/18 1518 02/23/18 2146 02/24/18 0538 02/24/18 0546  BP: (!) 144/69 (!) 146/79  137/72  Pulse: (!) 49 (!) 53  66  Resp: (!) 22     Temp:  (!) 97.5 F (36.4 C)  97.8 F (36.6 C)  TempSrc:  Oral  Oral  SpO2: 98% 100%  97%  Weight:   74.9 kg   Height:       Constitutional: Gentleman with down's syndrome. Was calmly sitting in a chair. Nonverbal except for "yes" and "no" with the examiner. Eyes:Nystagmus (present for life) Cardiovascular:Normal rateand regular rhythm. No murmurs, rubs, or gallops. Pulmonary/Chest:Effort normal. Clear to auscultation bilaterally. No wheezes, rales, or rhonchi. Ext: No lower extremity edema. Skin: Warm and dry. No rashes or wounds.  Assessment/Plan:  Active Problems:   Syncope  [name] is a 52 yo male with a medical history of Down's syndrome, HTN, hypothyroidism, OSA, and seizure disorder who presented after a syncopal episode, bradycardia to the 30s, and hypotension to the 80s systolic.   Syncope Symptomatic Bradycardia and Hypotension - No further episodes of symptomatic bradycardia while on tele throughout the night. Telemetry showed normal sinus rhythm and bradycardia to the 50s while the patient was sleeping and asymptomatic. This confirms that clonidine was the likely cause of his bradycardia. EP has reviewed his telemetry and agrees that the patient is medically appropriate for discharge. They do not think he requires pacemaker placement at this time.  Plan - Discontinue clonidine (added to patient's allergy list) -  Discharge back to his group home today   Agitation - Mostafa remained calm overnight on telemetry without any need for PRN ativan. Advised the patient's family that at his next visit with his neurologist, they should bring up whether keppra could be causing his behavioral changes. The family believes his aggression/agitation is due to staffing issues at the group home.   Dispo: Anticipated discharge today.  Trevaris Pennella, Cathleen Corti, MD 02/24/2018, 10:19 AM Pager: [phone]

## 2018-02-24 NOTE — Progress Notes (Addendum)
Progress Note  Patient Name: BRYANN MCNEALY Date of Encounter: 02/24/2018  Primary Cardiologist: New, dr. Elberta Fortis  Subjective   Ambulating in room comfortably, somewhat frustrated, wants tele patches off.  Appears in no physical distress  Inpatient Medications    Scheduled Meds: . calcitonin (salmon)  1 spray Alternating Nares Daily  . cholecalciferol  2,000 Units Oral Daily  . enoxaparin (LOVENOX) injection  40 mg Subcutaneous Q24H  . lacosamide  100 mg Oral BID  . lactulose  10 g Oral Daily  . levETIRAcetam  500 mg Oral BID  . levothyroxine  100 mcg Oral QAC breakfast  . omega-3 acid ethyl esters  1 g Oral Daily  . pyridOXINE  100 mg Oral Daily   Continuous Infusions:  PRN Meds: acetaminophen, LORazepam   Vital Signs    Vitals:   02/23/18 1518 02/23/18 2146 02/24/18 0538 02/24/18 0546  BP: (!) 144/69 (!) 146/79  137/72  Pulse: (!) 49 (!) 53  66  Resp: (!) 22     Temp:  (!) 97.5 F (36.4 C)  97.8 F (36.6 C)  TempSrc:  Oral  Oral  SpO2: 98% 100%  97%  Weight:   74.9 kg   Height:        Intake/Output Summary (Last 24 hours) at 02/24/2018 0921 Last data filed at 02/24/2018 0600 Gross per 24 hour  Intake 3529.21 ml  Output 350 ml  Net 3179.21 ml   Filed Weights   02/23/18 0940 02/24/18 0538  Weight: 72.6 kg 74.9 kg    Telemetry    SR/SB, 50's-60's - Personally Reviewed  ECG    No new EKGs - Personally Reviewed  Physical Exam   GEN: No acute physical distress, Down's facies.   Neck: No JVD Cardiac: RRR, no murmurs, rubs, or gallops.  Respiratory: CTA b/l. GI: Soft, nontender, non-distended  MS: No edema; No deformity. Neuro:  no gross physical deficits Psych: non-verbal (at baseline to strangers particularly), appears upset, but consolable and cooperative to exam, pulling tele patches.  Labs    Chemistry Recent Labs  Lab 02/23/18 0945  NA 138  K 4.0  CL 104  CO2 25  GLUCOSE 121*  BUN 12  CREATININE 0.99  CALCIUM 8.6*    GFRNONAA >60  GFRAA >60  ANIONGAP 9     Hematology Recent Labs  Lab 02/23/18 0945  WBC 5.3  RBC 3.89*  HGB 12.8*  HCT 38.9*  MCV 100.0  MCH 32.9  MCHC 32.9  RDW 12.9  PLT 200    Cardiac EnzymesNo results for input(s): TROPONINI in the last 168 hours.  Recent Labs  Lab 02/23/18 1034  TROPIPOC 0.00     BNPNo results for input(s): BNP, PROBNP in the last 168 hours.   DDimer No results for input(s): DDIMER in the last 168 hours.   Radiology    Dg Chest Portable 1 View Result Date: 02/23/2018 CLINICAL DATA:  Hypotension. EXAM: PORTABLE CHEST 1 VIEW COMPARISON:  Radiographs of September 23, 2015. FINDINGS: The heart size and mediastinal contours are within normal limits. Both lungs are clear. No pneumothorax or pleural effusion is noted. The visualized skeletal structures are unremarkable. IMPRESSION: No acute cardiopulmonary abnormality seen. Electronically Signed   By: Lupita Raider, M.D.   On: 02/23/2018 10:44    Cardiac Studies   Echo is completed, pending read  Patient Profile     52 y.o. male with a hx of Down Syndrome and seizure disorder only admitted to [facility] after  a syncopal event, and observed bradycardia, hypotension.  Her in the ER EKG was SB 59, 106/59, while here he was observed to have SB into 30's associated with decreased LOC, the father was present for this, said he was unable to arrouse him with sternal rub, he was given -.5atropine with improvement of HR, BP was 83/47 > last 120/70 HR 60's.  In observation while in the ER the patient remained sleepy, nodding off while seeing him/discussing events with his father, though woke easily.  His father and caregiver reported this as very unusual for the patient, usually very alert, active and interactive though not overtly verbal.  This despite having SR 60's with normalized BP  When seen initially w/u in the ER for possible sepsis with elevated lactic acid was in progress, other labs pending Given persistent  lethargy of unclear etiology, and new clonidine rx as a potential etiology of bradycardia, in d/w the patient's father, PPM was deferred.   Assessment & Plan    1. Bradycardia     No further significant bradycardia is noted on telemetry     [name] father reports once [place] on the monitor 39bpm, without symptoms, by then the patient's level of mentation/alertness/activity also back to normal  Echo is done, pending read  Dr. Elberta Fortis has seen and examined the patient.  In d/w the patient's father this morning, given no further significant bradycardia/symptomatic bradycardia off the clonidine, feel is best to try and avoid pacing.  The patient is ambulating in the room, very animated.  We Lizabeth Fellner not pursue pacing at this juncture and recommend avoiding any potential rate limiting/nodal blocking agents for this patient. Should he have any recurrent syncope, can revisit  perhaps loop implant.  [name] father is in agreement, and very comfortable, also would like to avoid PPM implantation for his son if at all possible. OK to discontinue telemetry OK to discharge from our perspective if echo reads ok  No clonidine or any potential rate limiting/nodal blocking agents going forward   For questions or updates, please contact [facility] Please consult [email or web address] for contact info under        Signed, Sheilah Pigeon, PA-C  02/24/2018, 9:21 AM    I have seen and examined this patient with Francis Dowse.  Agree with above, note added to reflect my findings.  On exam, RRR, no murmurs, lungs clear.  Patient did well overnight.  He was quite lethargic yesterday, but is much more alert and awake today.  Per his father, his heart rate did go down into the high 30s yesterday without any effect on the patient's mental status.  At this point, I do not feel that a pacemaker is indicated.  We Yvonne Petite continue to hold his clonidine and any AV nodal/rate  blocking agents moving forward.  EP to sign off.  Korri Ask M. Shaquera Ansley MD 02/24/2018 11:09 AM  [facility] Kayleann Mccaffery sign off.   Medication Recommendations:  none Other recommendations (labs, testing, etc):  none Follow up as an outpatient:  PRN

## 2018-02-24 NOTE — Evaluation (Signed)
Occupational Therapy Evaluation and Discharge Patient Details Name: [name] MRN: [id number] DOB: 1966-01-31 Today's Date: 02/24/2018    History of Present Illness [name] is a 52 yo male with a medical history of Down's syndrome, HTN, hypothyroidism, OSA, basically non-verbal, and seizure disorder who presented to the ED after a syncopal episode that occurred this morning slumping over at breakfast   Clinical Impression   This 52 yo male admitted with above presents to acute OT at or near baseline for basic ADLs (has group home and day program staff to A him prn). No further OT needs, we will sign off.    Follow Up Recommendations  No OT follow up;Supervision/Assistance - 24 hour    Equipment Recommendations  None recommended by OT       Precautions / Restrictions Precautions Precautions: Fall Restrictions Weight Bearing Restrictions: No Other Position/Activity Restrictions: Per dad, broken left pinky finger and recent hip was bothering pt (right hip)      Mobility Bed Mobility               General bed mobility comments: Pt in recliner upon entry  Transfers Overall transfer level: Needs assistance Equipment used: 1 person hand held assist Transfers: Sit to/from Stand Sit to Stand: Min guard         General transfer comment: min A for ambulation with pt having a forward lean posture and increase speed as he ambulates (his baseline per parents in room)    Balance Overall balance assessment: Needs assistance Sitting-balance support: No upper extremity supported;Feet supported Sitting balance-Leahy Scale: Good     Standing balance support: No upper extremity supported;During functional activity Standing balance-Leahy Scale: Fair Standing balance comment: standing at sink to wash hands                           ADL either performed or assessed with clinical judgement   ADL                                          General ADL Comments: Pt presents at a set up/S level for basic ADLs with minguard A sit<>stand and min A for ambulation. Dad does reprot that pt does get A with showering (but not sure how much) and maybe A for shoes. Pt demonstarted today that he can doff and donn socks, toilet, and wash hands at sink with levels stated eariler.     Vision Baseline Vision/History: Wears glasses Wears Glasses: At all times Additional Comments: nystagmus since birth            Pertinent Vitals/Pain Pain Assessment: Faces Faces Pain Scale: No hurt     Hand Dominance Right(but can use left as well due to PHMx; of right broken elbow)   Extremity/Trunk Assessment Upper Extremity Assessment Upper Extremity Assessment: Overall WFL for tasks assessed           Communication Communication Communication: Other (comment)(bascially non-verbal)   Cognition Arousal/Alertness: Awake/alert Behavior During Therapy: WFL for tasks assessed/performed Overall Cognitive Status: History of cognitive impairments - at baseline                                                Home Living  Available Help at Discharge: Available PRN/intermittently Type of Home: House Home Access: Ramped entrance     Home Layout: One level               Home Equipment: None   Additional Comments: All group homes go to a center during day - ride a [employer]      Prior Functioning/Environment Level of Independence: Independent        Comments: Dresses self with set up but needs some help with shoes, A with showering        OT Problem List: Impaired balance (sitting and/or standing)         OT Goals(Current goals can be found in the care plan section) Acute Rehab OT Goals Patient Stated Goal: family reports pt probably back to group home tomorrow  OT Frequency:             Co-evaluation PT/OT/SLP Co-Evaluation/Treatment: Yes(partial) Reason for Co-Treatment: For patient/therapist  safety   OT goals addressed during session: ADL's and self-care      AM-PAC PT "6 Clicks" Daily Activity     Outcome Measure Help from another person eating meals?: A Little Help from another person taking care of personal grooming?: A Little Help from another person toileting, which includes using toliet, bedpan, or urinal?: A Little Help from another person bathing (including washing, rinsing, drying)?: A Little Help from another person to put on and taking off regular upper body clothing?: A Little Help from another person to put on and taking off regular lower body clothing?: A Little 6 Click Score: 18   End of Session Equipment Utilized During Treatment: (tried gait belt to start, but pt was irriated by it so removed) Nurse Communication: (written on board mobility status)  Activity Tolerance: Patient tolerated treatment well Patient left: with call bell/phone within reach;with family/visitor present(parents say they will be with him, did not want chair alarm due to bed alarm really upset pt earilier)  OT Visit Diagnosis: Other abnormalities of gait and mobility (R26.89)                Time: 1610-9604 OT Time Calculation (min): 27 min Charges:  OT General Charges $OT Visit: 1 Visit OT Evaluation $OT Eval Moderate Complexity: 1 Mod  Ignacia Palma, OTR/L Acute [employer] [phone] Office [phone]

## 2018-02-24 NOTE — Progress Notes (Signed)
Written prescription given to [facility] from [facility]. Pt's dad present.

## 2018-02-24 NOTE — Progress Notes (Signed)
  Echocardiogram 2D Echocardiogram has been performed.  Delcie Roch 02/24/2018, 9:19 AM

## 2018-02-24 NOTE — Progress Notes (Signed)
Discharge instructions reviewed with pt. Pt's father and Sallye Ober from [facility] health services do not have any questions at this time. IV d/c.  Paged MD to see if we were able to get written prescription.

## 2018-02-24 NOTE — Progress Notes (Signed)
Spoke with case Production designer, theatre/television/film and social worker about pt being discharged. From their stand point pt is ready for discharge.

## 2018-02-24 NOTE — Progress Notes (Signed)
Spoke with Dr. Elberta Fortis this morning and it is okay to discontinue cardiac monitoring. Will cont to monitor pt.

## 2018-02-28 DIAGNOSIS — J069 Acute upper respiratory infection, unspecified: Secondary | ICD-10-CM | POA: Diagnosis not present

## 2018-02-28 DIAGNOSIS — Q909 Down syndrome, unspecified: Secondary | ICD-10-CM | POA: Diagnosis not present

## 2018-02-28 DIAGNOSIS — R05 Cough: Secondary | ICD-10-CM | POA: Diagnosis not present

## 2018-02-28 LAB — CULTURE, BLOOD (ROUTINE X 2)
Culture: NO GROWTH
Culture: NO GROWTH

## 2018-03-06 ENCOUNTER — Ambulatory Visit: Payer: Medicare Other | Admitting: Internal Medicine

## 2018-03-07 DIAGNOSIS — E039 Hypothyroidism, unspecified: Secondary | ICD-10-CM | POA: Diagnosis not present

## 2018-03-07 DIAGNOSIS — Q909 Down syndrome, unspecified: Secondary | ICD-10-CM | POA: Diagnosis not present

## 2018-03-07 DIAGNOSIS — F79 Unspecified intellectual disabilities: Secondary | ICD-10-CM | POA: Diagnosis not present

## 2018-03-07 DIAGNOSIS — I10 Essential (primary) hypertension: Secondary | ICD-10-CM | POA: Diagnosis not present

## 2018-03-07 DIAGNOSIS — G4733 Obstructive sleep apnea (adult) (pediatric): Secondary | ICD-10-CM | POA: Diagnosis not present

## 2018-03-07 DIAGNOSIS — K219 Gastro-esophageal reflux disease without esophagitis: Secondary | ICD-10-CM | POA: Diagnosis not present

## 2018-03-08 ENCOUNTER — Ambulatory Visit (INDEPENDENT_AMBULATORY_CARE_PROVIDER_SITE_OTHER): Payer: Medicare Other | Admitting: Internal Medicine

## 2018-03-08 ENCOUNTER — Encounter: Payer: Self-pay | Admitting: Internal Medicine

## 2018-03-08 ENCOUNTER — Ambulatory Visit (INDEPENDENT_AMBULATORY_CARE_PROVIDER_SITE_OTHER): Payer: Medicare Other | Admitting: Adult Health

## 2018-03-08 ENCOUNTER — Encounter: Payer: Self-pay | Admitting: Adult Health

## 2018-03-08 VITALS — BP 128/88 | HR 86 | Ht 61.0 in | Wt 168.8 lb

## 2018-03-08 VITALS — BP 100/54 | HR 81 | Ht 63.0 in | Wt 169.2 lb

## 2018-03-08 DIAGNOSIS — R001 Bradycardia, unspecified: Secondary | ICD-10-CM

## 2018-03-08 DIAGNOSIS — G4733 Obstructive sleep apnea (adult) (pediatric): Secondary | ICD-10-CM

## 2018-03-08 DIAGNOSIS — R569 Unspecified convulsions: Secondary | ICD-10-CM

## 2018-03-08 DIAGNOSIS — T50905A Adverse effect of unspecified drugs, medicaments and biological substances, initial encounter: Secondary | ICD-10-CM | POA: Diagnosis not present

## 2018-03-08 MED ORDER — LEVETIRACETAM 250 MG PO TABS: 250.0000 mg | ORAL_TABLET | Freq: Two times a day (BID) | ORAL | 5 refills | Status: DC

## 2018-03-08 MED ORDER — LACOSAMIDE 150 MG PO TABS: 150.0000 mg | ORAL_TABLET | Freq: Two times a day (BID) | ORAL | 5 refills | Status: DC

## 2018-03-08 NOTE — Progress Notes (Signed)
I have read the note, and I agree with the clinical assessment and plan.  Charles K Willis   

## 2018-03-08 NOTE — Assessment & Plan Note (Signed)
He seems very drowsy today.  He has not been wearing CPAP for a year.  Apparently machine was not replaced as ordered at that time and I cannot get a download to assess until he gets a new machine.  Staff describes his sleep as restless and thinks he sleeps better in a recliner chair.  They can put that back in his bedroom.  Reference was made to abnormal liver functions but I do not know if it was abnormal enough to explain loss of alertness. Plan-we will try again to get old CPAP machine replaced with one that we can download-auto 5-15.  Get La-Z-Boy chair put back in his room to see if he sleeps better like that.  Get mask refitted, hopefully to one he likes better.

## 2018-03-08 NOTE — Assessment & Plan Note (Addendum)
This was attributed to clonidine, which has been DC'd.  If BP and heart rate stay slow, then he should see cardiology.  His father says he has never had a significant cardiac problem.

## 2018-03-08 NOTE — Patient Instructions (Addendum)
Order- [facility]- please replace old CPAP machine- change to auto 5-15, please refit mask- he may have preferred his previous full face mask. Continue humidifier, supplies, AirView  Recommend he be allowed to sleep in his recliner chair to see if he sleeps better and is more awake in daytime.  Could consider a caffeine tablet in the morning (NoDoz etc)  If BP and heart rate stay low, then recommend cardiology referral.

## 2018-03-08 NOTE — Patient Instructions (Signed)
Your Plan:  Increase Vimpat 150 mg twice a day Decrease Keppra 250 mg twice a day Call if he has any seizures If your symptoms worsen or you develop new symptoms please let us know.   Thank you for coming to see [place] at [facility]. I hope we have been able to provide you high quality care today.  You may receive a patient satisfaction survey over the next few weeks. We would appreciate your feedback and comments so that we may continue to improve ourselves and the health of our patients.

## 2018-03-08 NOTE — Progress Notes (Signed)
PATIENT: [name] DOB: May 28, 1966  REASON FOR VISIT: follow up HISTORY FROM: patient  HISTORY OF PRESENT ILLNESS: Today 03/08/18:  [name] is a 52 year old male with a history of Down syndrome and seizures.  He returns today for follow-up.  He is here today with his 2 caregivers.  They report that he has not had any seizure events.  He remains on Vimpat 100 mg twice a day and Keppra 500 mg twice a day.  In the past it was noted that Keppra made him very irritable.  The plan was to wean him off of this medication.  He returns today for an evaluation.  HISTORY 08/30/17 [name] is a 52 year old male with a history of Down syndrome and seizures.  He returns today for follow-up.  His blood work at the last visit indicated an elevated ammonia level therefore Depakote was decreased to 250 mg twice a day and Keppra was increased to 500 mg twice a day.  I spoke to the patient's nurse over the phone.  She reports that directly after the increasing Keppra they noted some behavioral changes.  The patient has become more irritable. SHe denies any seizure events.  He continues to live at a group home.  The nurse denies any significant drowsiness.  He returns today for evaluation.  REVIEW OF SYSTEMS: Out of a complete 14 system review of symptoms, the patient complains only of the following symptoms, and all other reviewed systems are negative.  See HPI  ALLERGIES: Allergies  Allergen Reactions  . Clonidine Derivatives     Causes symptomatic bradycardia with syncope and hypotension    HOME MEDICATIONS: Outpatient Medications Prior to Visit  Medication Sig Dispense Refill  . acetaminophen (TYLENOL) 325 MG tablet Take 650 mg by mouth every 6 (six) hours as needed.    . benazepril (LOTENSIN) 10 MG tablet Take 10 mg by mouth at bedtime.     . calcitonin, salmon, (MIACALCIN/FORTICAL) 200 UNIT/ACT nasal spray Place 1 spray into alternate nostrils daily.    . cholecalciferol  (VITAMIN D) 1000 units tablet Take 2,000 Units by mouth daily.    . cloNIDine (CATAPRES) 0.1 MG tablet Take 0.1 mg by mouth daily.    . Hypromellose (ARTIFICIAL TEARS OP) Apply 1 drop to eye 3 (three) times daily. For dry eye    . Infant Care Products (JOHNSONS BABY SHAMPOO) SHAM 1/2 H2O and 1/2 shampoo-scrub eyebrows qd    . lactulose (CHRONULAC) 10 GM/15ML solution Take 10 g by mouth daily.    Marland Kitchen levothyroxine (SYNTHROID, LEVOTHROID) 100 MCG tablet Take 100 mcg by mouth daily before breakfast.    . loperamide (IMODIUM) 2 MG capsule Take by mouth as needed for diarrhea or loose stools.    Marland Kitchen omega-3 acid ethyl esters (LOVAZA) 1 g capsule Take 1 g by mouth daily.    . Omega-3 Fatty Acids (FISH OIL) 1000 MG CAPS Take by mouth.    . pyridOXINE (VITAMIN B-6) 100 MG tablet Take 100 mg by mouth daily.    . SUNSCREEN SPF30 EX Apply 1 application topically daily as needed (prevent sunburn).    . terbinafine (LAMISIL) 1 % cream Apply 1 application topically daily as needed.    . Lacosamide 100 MG TABS Take 1 tablet (100 mg total) by mouth 2 (two) times daily. 60 tablet 0  . levETIRAcetam (KEPPRA) 500 MG tablet Take 1 tablet (500 mg total) by mouth 2 (two) times daily. 60 tablet 5   No facility-administered medications  prior to visit.     PAST MEDICAL HISTORY: Past Medical History:  Diagnosis Date  . Down's syndrome   . GERD (gastroesophageal reflux disease)   . Hypertension   . Hypothyroid   . Lives in group home    [facility]-[phone]-fax-[phone]-Roxanne-nurse  . Nystagmus   . Obstructive sleep apnea (adult) (pediatric)    does use a cpap at night  . Seizure disorder (HCC) 10/28/2016  . Seizures (HCC)     PAST SURGICAL HISTORY: Past Surgical History:  Procedure Laterality Date  . IRRIGATION AND DEBRIDEMENT  07/01/2014      . ORIF HUMERUS FRACTURE Left 02/05/2013   Procedure: OPEN REDUCTION INTERNAL FIXATION (ORIF) MEDIAL HUMERUS CONDYLE FRACTURE;  Surgeon: Jodi Marble, MD;   Location: [facility];  Service: Orthopedics;  Laterality: Left;  . Right Elbow surgery      FAMILY HISTORY: Family History  Problem Relation Age of Onset  . Dementia Maternal Grandmother   . Diabetes Paternal Grandfather   . Heart failure Paternal Grandfather   . Hypertension Paternal Grandfather     SOCIAL HISTORY: Social History   Socioeconomic History  . Marital status: Single    Spouse name: Not on file  . Number of children: 0  . Years of education: 42  . Highest education level: Not on file  Occupational History  . 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 Financial resource strain: Not on file  . Food insecurity:    Worry: Not on file    Inability: Not on file  . Transportation needs:    Medical: Not on file    Non-medical: Not on file  Tobacco Use  . Smoking status: Never Smoker  . Smokeless tobacco: Never Used  Substance and Sexual Activity  . Alcohol use: No  . Drug use: No  . Sexual activity: Not on file  Lifestyle  . Physical activity:    Days per week: Not on file    Minutes per session: Not on file  . Stress: Not on file  Relationships  . Social connections:    Talks on phone: Not on file    Gets together: Not on file    Attends religious service: Not on file    Active member of club or organization: Not on file    Attends meetings of clubs or organizations: Not on file    Relationship status: Not on file  . Intimate partner violence:    Fear of current or ex partner: Not on file    Emotionally abused: Not on file    Physically abused: Not on file    Forced sexual activity: Not on file  Other Topics Concern  . Not on file  Social History Narrative   Lives at a group home   Caffeine use: Tea/soda once per week   Right handed      PHYSICAL EXAM  Vitals:   03/08/18 1252  BP: 128/88  Pulse: 86  Weight: 168 lb 12.8 oz (76.6 kg)  Height: 5\' 1"  (1.549 m)   Body mass index is 31.89 kg/m.  Generalized: Well developed, in  no acute distress   Neurological examination  Mentation: Alert oriented to time, place, history taking. Follows all commands speech and language fluent Cranial nerve II-XII: Pupils were equal round reactive to light. Extraocular movements were full, visual field were full on confrontational test. Facial sensation and strength were normal. Uvula tongue midline. Head turning and shoulder shrug  were normal and symmetric. Motor:  The motor testing reveals 5 over 5 strength of all 4 extremities. Good symmetric motor tone is noted throughout.  Sensory: Sensory testing is intact to soft touch on all 4 extremities. No evidence of extinction is noted.  Coordination: Cerebellar testing reveals good finger-nose-finger and heel-to-shin bilaterally.  Gait and station: Gait is normal. Tandem gait is normal. Romberg is negative. No drift is seen.  Reflexes: Deep tendon reflexes are symmetric and normal bilaterally.   DIAGNOSTIC DATA (LABS, IMAGING, TESTING) - I reviewed patient records, labs, notes, testing and imaging myself where available.  Lab Results  Component Value Date   WBC 5.3 02/23/2018   HGB 12.8 (L) 02/23/2018   HCT 38.9 (L) 02/23/2018   MCV 100.0 02/23/2018   PLT 200 02/23/2018      Component Value Date/Time   NA 138 02/23/2018 0945   NA 140 08/30/2017 1504   K 4.0 02/23/2018 0945   CL 104 02/23/2018 0945   CO2 25 02/23/2018 0945   GLUCOSE 121 (H) 02/23/2018 0945   BUN 12 02/23/2018 0945   BUN 14 08/30/2017 1504   CREATININE 0.99 02/23/2018 0945   CALCIUM 8.6 (L) 02/23/2018 0945   PROT 6.9 08/30/2017 1504   ALBUMIN 3.7 08/30/2017 1504   AST 17 08/30/2017 1504   ALT 12 08/30/2017 1504   ALKPHOS 72 08/30/2017 1504   BILITOT 0.2 08/30/2017 1504   GFRNONAA >60 02/23/2018 0945   GFRAA >60 02/23/2018 0945    Lab Results  Component Value Date   TSH 0.350 02/23/2018      ASSESSMENT AND PLAN 52 y.o. year old male  has a past medical history of Down's syndrome, GERD  (gastroesophageal reflux disease), Hypertension, Hypothyroid, Lives in group home, Nystagmus, Obstructive sleep apnea (adult) (pediatric), Seizure disorder (HCC) (10/28/2016), and Seizures (HCC). here with :  1.  Seizures  Overall the patient has done well.  We will increase Vimpat to 150 mg twice a day.  I will decrease Keppra down to 250 mg twice a day.  If he does well with no seizure events we will continue to wean the patient off of Keppra.  I will call in 1 to 2 months to continue the weaning process.  Advised that if he has any seizures or new symptoms they should let us know.  He will follow-up in 6 months or sooner if needed.   I spent 15 minutes with the patient. 50% of this time was spent reviewing plan of care   [name], MSN, NP-C 03/08/2018, 1:29 PM [facility] [address] [place], [place] [address] [phone]

## 2018-03-08 NOTE — Progress Notes (Signed)
HPI male Down's Syndrome, followed for OSA, complicated by seizure disorder, GERD, HBP, hypothyroid NPSG 07/15/06- [facility] 55.7/ hr, desaturation to 94%, body weight 187 lbs, bruxism . Report is in Epic Notes.10/13/10   ----------------------------------------------------------------------------------------------------------------------------------  12/23/16- 52 year old male Down's Syndrome, followed for OSA, complicated by seizure disorder CPAP 10/Advanced FOLLOWS FOR: DME [facility]. Unable to DL due to machine being too old; lives in group home. Here with an aide. Notes from his group home indicate that his CPAP machine/mask are broken and they need orders to his DME for replacement. They have been making sure he wears his CPAP every night. He is low functioning and not able to make any comment himself.  03/08/2018- 52 year old male Down's Syndrome, followed for OSA, complicated by seizure disorder, GERD, HBP, hypothyroid, bradycardia CPAP auto 5-15/Advanced    Old machine reportedly not replaced 12/23/16 as ordered. -----He pulls off CPAP and has not been wearing it regularly. Father, Dr. Eulah Pont, and staff from nursing home are here today.  He will get his flu shot there.  Laker used to wear CPAP every night.  Starting about a year ago, possibly when his mask was changed, he began pulling it off immediately and refusing to wear it.  Sleep is described as restless.  He seems extremely tired in the daytime and is being evaluated for hypotension and bradycardia.  Staff note that he seems to sleep much better in recliner chair than in his bed, but chair has been removed from his room.  It can be put back. Liver functions were abnormal and Depakote was changed to Keppra.  Liver function will be reassessed when he follows up this afternoon with neurology.  ROS-see HPI- as reported by staff assistant with him  + = positive Constitutional:   No-   weight loss, night sweats, fevers, chills, +fatigue,  lassitude. HEENT:   No-  headaches, difficulty swallowing, tooth/dental problems, sore throat,       No-  sneezing, itching, ear ache, nasal congestion, post nasal drip,  CV:  No-   chest pain, orthopnea, PND, swelling in lower extremities, anasarca, dizziness, palpitations Resp: No-   shortness of breath with exertion or at rest.              No-   productive cough,  No non-productive cough,  No- coughing up of blood.              No-   change in color of mucus.  No- wheezing.   Skin: No-   rash or lesions. GI:  No-   heartburn, indigestion, abdominal pain, nausea, vomiting,  GU: No-   . MS:  No-   joint pain or swelling.  Neuro-     HPI Psych:  No- change in mood or affect. No depression or anxiety.  No memory loss.  OBJ- Physical Exam General- + very drowsy,  Affect-passive, Distress- none acute,+ non-verbal  Skin-  No breakdown or strap pressure marks. Lymphadenopathy- none Head- atraumatic            Eyes- Gross vision intact, PERRLA, conjunctivae and secretions clear            Ears- Hearing, grossly normal            Nose- Clear, no-Septal dev, mucus, polyps, erosion, perforation             Throat- Mallampati III-IV , mucosa clear , drainage- none, tonsils- atrophic Neck- flexible , trachea midline, no stridor , thyroid nl, carotid no bruit Chest -  symmetrical excursion , unlabored           Heart/CV- RRR , no murmur , no gallop  , no rub, nl s1 s2                           - JVD- none , edema- none, stasis changes- none, varices- none           Lung- clear to P&A, wheeze- none, cough- none , dullness-none, rub- none           Chest wall-  Abd-  Br/ Gen/ Rectal- Not done, not indicated Extrem-  Neuro- +severely retarded, nystagmus,  Does not seem to comprehend.

## 2018-03-14 ENCOUNTER — Telehealth: Payer: Self-pay | Admitting: Internal Medicine

## 2018-03-14 DIAGNOSIS — E039 Hypothyroidism, unspecified: Secondary | ICD-10-CM | POA: Diagnosis not present

## 2018-03-14 DIAGNOSIS — G40919 Epilepsy, unspecified, intractable, without status epilepticus: Secondary | ICD-10-CM | POA: Diagnosis not present

## 2018-03-14 DIAGNOSIS — Q909 Down syndrome, unspecified: Secondary | ICD-10-CM | POA: Diagnosis not present

## 2018-03-14 DIAGNOSIS — Z6829 Body mass index (BMI) 29.0-29.9, adult: Secondary | ICD-10-CM | POA: Diagnosis not present

## 2018-03-14 NOTE — Telephone Encounter (Signed)
Attempted to call Rodney with [facility]. I did not receive an answer. I have left a message for Rodney to return our call.  

## 2018-03-15 NOTE — Telephone Encounter (Signed)
He is in a group home. He would need an attended NPSG at sleep center, because he is mentally retarded Higher education careers adviser). Someone from group home or a parent would need to stay with him that night. We would need to let a parent know, for their approval, before going forward.

## 2018-03-15 NOTE — Telephone Encounter (Signed)
Attempted to call Rodney with [facility]. I did not receive an answer. I have left a message for Rodney to return our call.  

## 2018-03-15 NOTE — Telephone Encounter (Signed)
Spoke with Thereasa Distance with [facility]. He states that per the pt's insurance, he will have to repeat his sleep study in order to get a replacement machine.  CY - please advise. Thanks.

## 2018-03-15 NOTE — Telephone Encounter (Signed)
[name] is calling back [phone]-Ext# [phone]

## 2018-03-15 NOTE — Telephone Encounter (Signed)
Attempted to call pt's mother, Stephanie. I did not receive an answer. I have left a message for Stephanie to return our call.  

## 2018-03-16 NOTE — Telephone Encounter (Signed)
LMTCB

## 2018-03-17 NOTE — Telephone Encounter (Signed)
Attempted to call pt's mother, Judeth Cornfield. I did not receive an answer. I have left a message for Judeth Cornfield to return our call.

## 2018-03-20 NOTE — Telephone Encounter (Signed)
We have attempted to contact pt's mother, Judeth Cornfield several times with no success or call back from her. Per triage protocol, message will be closed.

## 2018-03-21 ENCOUNTER — Telehealth: Payer: Self-pay | Admitting: Internal Medicine

## 2018-03-21 DIAGNOSIS — G4733 Obstructive sleep apnea (adult) (pediatric): Secondary | ICD-10-CM

## 2018-03-21 NOTE — Telephone Encounter (Signed)
While helping out with phones up front, we received a call from patients father. He was returning call regarding new sleep study, he would like a call from [facility] to discuss this further.

## 2018-03-21 NOTE — Telephone Encounter (Signed)
Spoke with patient's father. He is aware that the patient will need another sleep study and is ok with this. Will go ahead and order the NPSG that CY has requested. He verbalized understanding. Nothing further needed at time of call.

## 2018-03-21 NOTE — Addendum Note (Signed)
Addended by: Maurene Capes on: 03/21/2018 02:41 PM   Modules accepted: Orders

## 2018-03-23 DIAGNOSIS — F329 Major depressive disorder, single episode, unspecified: Secondary | ICD-10-CM | POA: Diagnosis not present

## 2018-03-26 ENCOUNTER — Emergency Department (HOSPITAL_COMMUNITY): Payer: Medicare Other

## 2018-03-26 ENCOUNTER — Emergency Department (HOSPITAL_COMMUNITY)
Admission: EM | Admit: 2018-03-26 | Discharge: 2018-03-26 | Disposition: A | Discharge status: Home / Self Care | Payer: Medicare Other | Attending: Emergency Medicine | Admitting: Emergency Medicine

## 2018-03-26 DIAGNOSIS — Y9389 Activity, other specified: Secondary | ICD-10-CM | POA: Diagnosis not present

## 2018-03-26 DIAGNOSIS — Y999 Unspecified external cause status: Secondary | ICD-10-CM | POA: Insufficient documentation

## 2018-03-26 DIAGNOSIS — W07XXXA Fall from chair, initial encounter: Secondary | ICD-10-CM | POA: Insufficient documentation

## 2018-03-26 DIAGNOSIS — S0993XA Unspecified injury of face, initial encounter: Secondary | ICD-10-CM | POA: Diagnosis not present

## 2018-03-26 DIAGNOSIS — I4510 Unspecified right bundle-branch block: Secondary | ICD-10-CM | POA: Diagnosis not present

## 2018-03-26 DIAGNOSIS — Z79899 Other long term (current) drug therapy: Secondary | ICD-10-CM | POA: Diagnosis not present

## 2018-03-26 DIAGNOSIS — Y92198 Other place in other specified residential institution as the place of occurrence of the external cause: Secondary | ICD-10-CM | POA: Diagnosis not present

## 2018-03-26 DIAGNOSIS — I10 Essential (primary) hypertension: Secondary | ICD-10-CM | POA: Insufficient documentation

## 2018-03-26 DIAGNOSIS — S0990XA Unspecified injury of head, initial encounter: Secondary | ICD-10-CM | POA: Diagnosis not present

## 2018-03-26 DIAGNOSIS — R220 Localized swelling, mass and lump, head: Secondary | ICD-10-CM | POA: Diagnosis not present

## 2018-03-26 DIAGNOSIS — S0101XA Laceration without foreign body of scalp, initial encounter: Secondary | ICD-10-CM | POA: Diagnosis not present

## 2018-03-26 DIAGNOSIS — R58 Hemorrhage, not elsewhere classified: Secondary | ICD-10-CM | POA: Diagnosis not present

## 2018-03-26 DIAGNOSIS — R0902 Hypoxemia: Secondary | ICD-10-CM | POA: Diagnosis not present

## 2018-03-26 DIAGNOSIS — R404 Transient alteration of awareness: Secondary | ICD-10-CM | POA: Diagnosis not present

## 2018-03-26 DIAGNOSIS — E039 Hypothyroidism, unspecified: Secondary | ICD-10-CM | POA: Insufficient documentation

## 2018-03-26 DIAGNOSIS — S199XXA Unspecified injury of neck, initial encounter: Secondary | ICD-10-CM | POA: Diagnosis not present

## 2018-03-26 DIAGNOSIS — W19XXXA Unspecified fall, initial encounter: Secondary | ICD-10-CM | POA: Diagnosis not present

## 2018-03-26 DIAGNOSIS — Q909 Down syndrome, unspecified: Secondary | ICD-10-CM | POA: Insufficient documentation

## 2018-03-26 MED ORDER — LIDOCAINE HCL (PF) 1 % IJ SOLN
30.0000 mL | Freq: Once | INTRAMUSCULAR | Status: DC
  Filled 2018-03-26: qty 30

## 2018-03-26 NOTE — ED Notes (Signed)
Patient transported to CT 

## 2018-03-26 NOTE — ED Provider Notes (Signed)
[facility] EMERGENCY DEPARTMENT Provider Note   CSN: [id number] Arrival date & time: 03/26/18  4098     History   Chief Complaint Chief Complaint  Patient presents with  . Fall    HPI [name] is a 52 y.o. male with history of Down syndrome, hypertension, seizure disorder who presents with large scalp laceration after mechanical fall getting up from a chair.  He comes from a group home.  He was with a staff member and they turned her back and patient fell into a wooden cubby.  No loss of consciousness.  Patient is nonverbal at baseline.  History is limited from the patient, however history obtained from EMS and group home supervisor.  Tetanus 2016, per chart review.  Level 5 caveat secondary to Down syndrome and nonverbal status  HPI  Past Medical History:  Diagnosis Date  . Down's syndrome   . GERD (gastroesophageal reflux disease)   . Hypertension   . Hypothyroid   . Lives in group home    [facility]-[phone]-Roxanne-nurse  . Nystagmus   . Obstructive sleep apnea (adult) (pediatric)    does use a cpap at night  . Seizure disorder (HCC) 10/28/2016  . Seizures [facility])     Patient Active Problem List   Diagnosis Date Noted  . Bradycardia, drug induced 02/24/2018  . Syncope 02/23/2018  . Seizure disorder (HCC) 10/28/2016  . Obstructive sleep apnea 07/05/2008  . Down syndrome 07/05/2008    Past Surgical History:  Procedure Laterality Date  . IRRIGATION AND DEBRIDEMENT  07/01/2014      . ORIF HUMERUS FRACTURE Left 02/05/2013   Procedure: OPEN REDUCTION INTERNAL FIXATION (ORIF) MEDIAL HUMERUS CONDYLE FRACTURE;  Surgeon: Jodi Marble, MD;  Location: [facility];  Service: Orthopedics;  Laterality: Left;  . Right Elbow surgery          Home Medications    Prior to Admission medications   Medication Sig Start Date End Date Taking? Authorizing Provider  acetaminophen (TYLENOL) 325 MG tablet Take 650  mg by mouth every 6 (six) hours as needed for mild pain.    Yes [provider]  alum & mag hydroxide-simeth (MAALOX/MYLANTA) 200-200-20 MG/5ML suspension Take 15 mLs by mouth every 6 (six) hours as needed for indigestion or heartburn.   Yes [provider]  benazepril (LOTENSIN) 10 MG tablet Take 10 mg by mouth every evening.    Yes [provider]  calcitonin, salmon, (MIACALCIN/FORTICAL) 200 UNIT/ACT nasal spray Place 1 spray into alternate nostrils daily.   Yes [provider]  cholecalciferol (VITAMIN D) 1000 units tablet Take 2,000 Units by mouth daily.   Yes [provider]  diphenhydrAMINE (BENADRYL) 25 mg capsule Take 25 mg by mouth every 4 (four) hours as needed for itching or allergies.   Yes [provider]  Hypromellose (ARTIFICIAL TEARS OP) Apply 1 drop to eye 3 (three) times daily.    Yes [provider]  Infant Care Products (JOHNSONS BABY SHAMPOO) SHAM Apply 1 application topically daily. Mix 1/2 water and 1/2 shampoo, Scrub and apply on eyebrows and eyelid everyday.   Yes [provider]  Lacosamide (VIMPAT) 150 MG TABS Take 1 tablet (150 mg total) by mouth 2 (two) times daily. Patient taking differently: Take 100 mg by mouth 2 (two) times daily.  03/08/18  Yes [name], NP  lactulose (CHRONULAC) 10 GM/15ML solution Take 10 g by mouth daily as needed for mild constipation.    Yes  [provider]  levETIRAcetam (KEPPRA) 250 MG tablet Take 1 tablet (250 mg total) by mouth 2 (two) times daily. Patient taking differently: Take 500 mg by mouth 2 (two) times daily.  03/08/18  Yes [name], NP  levothyroxine (SYNTHROID, LEVOTHROID) 100 MCG tablet Take 100 mcg by mouth daily before breakfast.   Yes [provider]  loperamide (IMODIUM) 2 MG capsule Take by mouth as needed for diarrhea or loose stools.   Yes [provider]  promethazine (PHENERGAN) 25 MG tablet Take 25 mg by mouth  every 6 (six) hours as needed for nausea or vomiting.   Yes [provider]  pyridOXINE (VITAMIN B-6) 100 MG tablet Take 100 mg by mouth daily.   Yes [provider]  SUNSCREEN SPF30 EX Apply 1 application topically daily as needed (prevent sunburn).   Yes [provider]  terbinafine (LAMISIL) 1 % cream Apply 1 application topically daily.    Yes [provider]    Family History Family History  Problem Relation Age of Onset  . Dementia Maternal Grandmother   . Diabetes Paternal Grandfather   . Heart failure Paternal Grandfather   . Hypertension Paternal Grandfather     Social History Social History   Tobacco Use  . Smoking status: Never Smoker  . Smokeless tobacco: Never Used  Substance Use Topics  . Alcohol use: No  . Drug use: No     Allergies   Clonidine derivatives   Review of Systems Review of Systems  Unable to perform ROS: Patient nonverbal  Skin: Positive for wound.  Neurological: Negative for syncope.     Physical Exam Updated Vital Signs BP (!) 152/92   Pulse 72   Temp 97.9 F (36.6 C) (Axillary)   Resp 16   SpO2 100%   Physical Exam  Constitutional: He appears well-developed and well-nourished. No distress.  HENT:  Head: Normocephalic and atraumatic.  Mouth/Throat: Oropharynx is clear and moist. No oropharyngeal exudate.  15 cm hook-shaped laceration from the crown of the scalp into the forehead  Eyes: Pupils are equal, round, and reactive to light. Conjunctivae and EOM are normal. Right eye exhibits no discharge. Left eye exhibits no discharge. No scleral icterus.  Neck: Normal range of motion. Neck supple. No thyromegaly present.  Cardiovascular: Normal rate, regular rhythm, normal heart sounds and intact distal pulses. Exam reveals no gallop and no friction rub.  No murmur heard. Pulmonary/Chest: Effort normal and breath sounds normal. No stridor. No respiratory distress. He has no wheezes. He has no rales.    Abdominal: Soft. Bowel sounds are normal. He exhibits no distension. There is no tenderness. There is no rebound and no guarding.  Musculoskeletal: He exhibits no edema.  No step-offs or ecchymosis noted to the cervical, thoracic, or lumbar spine; no indication of pain  Lymphadenopathy:    He has no cervical adenopathy.  Neurological: He is alert. Coordination normal.  Patient minimally follows commands, this is baseline  Skin: Skin is warm and dry. No rash noted. He is not diaphoretic. No pallor.  Psychiatric: He has a normal mood and affect.  Nursing note and vitals reviewed.    ED Treatments / Results  Labs (all labs ordered are listed, but only abnormal results are displayed) Labs Reviewed - No data to display  EKG EKG Interpretation  Date/Time:  Sunday March 26 2018 09:13:18 EDT Ventricular Rate:  82 PR Interval:    QRS Duration: 127 QT Interval:  405 QTC Calculation: 473 R Axis:   -  33 Text Interpretation:  Sinus rhythm Right bundle branch block No significant change since last tracing Confirmed by Doug Sou [phone]) on 03/26/2018 9:31:29 AM   Radiology Ct Head Wo Contrast  Result Date: 03/26/2018 CLINICAL DATA:  Fall, head laceration EXAM: CT HEAD WITHOUT CONTRAST CT MAXILLOFACIAL WITHOUT CONTRAST CT CERVICAL SPINE WITHOUT CONTRAST TECHNIQUE: Multidetector CT imaging of the head, cervical spine, and maxillofacial structures were performed using the standard protocol without intravenous contrast. Multiplanar CT image reconstructions of the cervical spine and maxillofacial structures were also generated. COMPARISON:  CT head dated 11/09/2016 FINDINGS: CT HEAD FINDINGS Brain: No evidence of acute infarction, hemorrhage, hydrocephalus, extra-axial collection or mass lesion/mass effect. Vascular: No hyperdense vessel or unexpected calcification. Skull: Normal. Negative for fracture or focal lesion. Other: Large soft tissue laceration overlying the left frontal bone  (series 4/image 58). CT MAXILLOFACIAL FINDINGS Osseous: No evidence of maxillofacial fracture. Mandible is intact. Bilateral mandibular condyles are well-seated in the TMJs. Orbits: Bilateral orbits, including the globes and retroconal soft tissues, are within normal limits. Sinuses: The visualized paranasal sinuses are essentially clear. The mastoid air cells are unopacified. Soft tissues: Mild soft tissue swelling overlying the midline mandible (series 8/image 16). CT CERVICAL SPINE FINDINGS Alignment: Normal cervical lordosis. Skull base and vertebrae: No acute fracture. No primary bone lesion or focal pathologic process. Soft tissues and spinal canal: No prevertebral fluid or swelling. No visible canal hematoma. Disc levels: Mild degenerative changes of the upper cervical spine, most prominent at C3-4. Mild to moderate narrowing of right C3-4 and C4-5 neural foramina as well as the left C4-5 and C5-6 neural foramina. Spinal canal is patent. Upper chest: Visualized lung apices are clear. Other: Visualized thyroid is unremarkable. IMPRESSION: Large soft tissue laceration overlying the left frontal bone. No evidence of calvarial fracture. No evidence of acute intracranial abnormality. Mild soft tissue swelling overlying the midline mandible. No evidence of maxillofacial fracture. No evidence of traumatic injury to the cervical spine. Mild degenerative changes of the upper cervical spine, as above. Electronically Signed   By: Charline Bills M.D.   On: 03/26/2018 11:54   Ct Cervical Spine Wo Contrast  Result Date: 03/26/2018 CLINICAL DATA:  Fall, head laceration EXAM: CT HEAD WITHOUT CONTRAST CT MAXILLOFACIAL WITHOUT CONTRAST CT CERVICAL SPINE WITHOUT CONTRAST TECHNIQUE: Multidetector CT imaging of the head, cervical spine, and maxillofacial structures were performed using the standard protocol without intravenous contrast. Multiplanar CT image reconstructions of the cervical spine and maxillofacial  structures were also generated. COMPARISON:  CT head dated 11/09/2016 FINDINGS: CT HEAD FINDINGS Brain: No evidence of acute infarction, hemorrhage, hydrocephalus, extra-axial collection or mass lesion/mass effect. Vascular: No hyperdense vessel or unexpected calcification. Skull: Normal. Negative for fracture or focal lesion. Other: Large soft tissue laceration overlying the left frontal bone (series 4/image 58). CT MAXILLOFACIAL FINDINGS Osseous: No evidence of maxillofacial fracture. Mandible is intact. Bilateral mandibular condyles are well-seated in the TMJs. Orbits: Bilateral orbits, including the globes and retroconal soft tissues, are within normal limits. Sinuses: The visualized paranasal sinuses are essentially clear. The mastoid air cells are unopacified. Soft tissues: Mild soft tissue swelling overlying the midline mandible (series 8/image 16). CT CERVICAL SPINE FINDINGS Alignment: Normal cervical lordosis. Skull base and vertebrae: No acute fracture. No primary bone lesion or focal pathologic process. Soft tissues and spinal canal: No prevertebral fluid or swelling. No visible canal hematoma. Disc levels: Mild degenerative changes of the upper cervical spine, most prominent at C3-4. Mild to moderate narrowing of right C3-4 and C4-5  neural foramina as well as the left C4-5 and C5-6 neural foramina. Spinal canal is patent. Upper chest: Visualized lung apices are clear. Other: Visualized thyroid is unremarkable. IMPRESSION: Large soft tissue laceration overlying the left frontal bone. No evidence of calvarial fracture. No evidence of acute intracranial abnormality. Mild soft tissue swelling overlying the midline mandible. No evidence of maxillofacial fracture. No evidence of traumatic injury to the cervical spine. Mild degenerative changes of the upper cervical spine, as above. Electronically Signed   By: Charline Bills M.D.   On: 03/26/2018 11:54   Ct Maxillofacial Wo Contrast  Result Date:  03/26/2018 CLINICAL DATA:  Fall, head laceration EXAM: CT HEAD WITHOUT CONTRAST CT MAXILLOFACIAL WITHOUT CONTRAST CT CERVICAL SPINE WITHOUT CONTRAST TECHNIQUE: Multidetector CT imaging of the head, cervical spine, and maxillofacial structures were performed using the standard protocol without intravenous contrast. Multiplanar CT image reconstructions of the cervical spine and maxillofacial structures were also generated. COMPARISON:  CT head dated 11/09/2016 FINDINGS: CT HEAD FINDINGS Brain: No evidence of acute infarction, hemorrhage, hydrocephalus, extra-axial collection or mass lesion/mass effect. Vascular: No hyperdense vessel or unexpected calcification. Skull: Normal. Negative for fracture or focal lesion. Other: Large soft tissue laceration overlying the left frontal bone (series 4/image 58). CT MAXILLOFACIAL FINDINGS Osseous: No evidence of maxillofacial fracture. Mandible is intact. Bilateral mandibular condyles are well-seated in the TMJs. Orbits: Bilateral orbits, including the globes and retroconal soft tissues, are within normal limits. Sinuses: The visualized paranasal sinuses are essentially clear. The mastoid air cells are unopacified. Soft tissues: Mild soft tissue swelling overlying the midline mandible (series 8/image 16). CT CERVICAL SPINE FINDINGS Alignment: Normal cervical lordosis. Skull base and vertebrae: No acute fracture. No primary bone lesion or focal pathologic process. Soft tissues and spinal canal: No prevertebral fluid or swelling. No visible canal hematoma. Disc levels: Mild degenerative changes of the upper cervical spine, most prominent at C3-4. Mild to moderate narrowing of right C3-4 and C4-5 neural foramina as well as the left C4-5 and C5-6 neural foramina. Spinal canal is patent. Upper chest: Visualized lung apices are clear. Other: Visualized thyroid is unremarkable. IMPRESSION: Large soft tissue laceration overlying the left frontal bone. No evidence of calvarial fracture.  No evidence of acute intracranial abnormality. Mild soft tissue swelling overlying the midline mandible. No evidence of maxillofacial fracture. No evidence of traumatic injury to the cervical spine. Mild degenerative changes of the upper cervical spine, as above. Electronically Signed   By: Charline Bills M.D.   On: 03/26/2018 11:54    Procedures .Marland KitchenLaceration Repair Date/Time: 03/26/2018 3:11 PM Performed by: Emi Holes, PA-C Authorized by: Emi Holes, PA-C   Consent:    Consent obtained:  Verbal   Consent given by:  Healthcare agent (group home worker)   Risks discussed:  Pain, poor cosmetic result and infection   Alternatives discussed:  No treatment Anesthesia (see MAR for exact dosages):    Anesthesia method:  Local infiltration   Local anesthetic:  Lidocaine 1% w/o epi Laceration details:    Location:  Scalp   Scalp location:  Frontal (Crown to frontal)   Length (cm):  15 (hook shaped) Repair type:    Repair type:  Intermediate Pre-procedure details:    Preparation:  Patient was prepped and draped in usual sterile fashion and imaging obtained to evaluate for foreign bodies Exploration:    Hemostasis achieved with:  Direct pressure   Wound exploration: wound explored through full range of motion and entire depth of wound probed and  visualized     Wound extent: no foreign bodies/material noted, no muscle damage noted and no vascular damage noted     Contaminated: no   Treatment:    Area cleansed with:  Saline   Amount of cleaning:  Standard   Irrigation solution:  Sterile saline   Irrigation volume:  200   Irrigation method:  Syringe   Visualized foreign bodies/material removed: no   Skin repair:    Repair method:  Sutures and staples   Suture size:  5-0   Wound skin closure material used: Ethilon.   Suture technique:  Simple interrupted   Number of sutures:  8   Number of staples:  9 Approximation:    Approximation:  Close Post-procedure details:     Dressing:  Antibiotic ointment, non-adherent dressing and tube gauze   Patient tolerance of procedure:  Tolerated well, no immediate complications   (including critical care time)  Medications Ordered in ED Medications  lidocaine (PF) (XYLOCAINE) 1 % injection 30 mL (has no administration in time range)     Initial Impression / Assessment and Plan / ED Course  I have reviewed the triage vital signs and the nursing notes.  Pertinent labs & imaging results that were available during my care of the patient were reviewed by me and considered in my medical decision making (see chart for details).     Patient with large scalp laceration after mechanical fall repaired with a combination of staples and sutures.  Tetanus is up-to-date.  CT head, C-spine, and maxillofacial show large laceration overlying the frontal bone however no evidence of fracture or acute intracranial abnormality; mild soft tissue swelling overlying the midline mandible without fracture and no acute changes to the cervical spine.  Patient tolerated procedure well.  Care instructions given to group home worker.  Return in 7 to 10 days for suture and staple removal.  Return precautions discussed for reasons to return sooner.  Caregiver understands and agrees with plan.  Patient vitals stable here ED course and discharged in satisfactory condition.  Patient also violated by Dr. Ethelda Chick who guided the patient's management and agrees with plan.  Final Clinical Impressions(s) / ED Diagnoses   Final diagnoses:  Laceration of scalp, initial encounter    ED Discharge Orders    None       Emi Holes, PA-C 03/26/18 1533    Doug Sou, MD 03/26/18 [phone]

## 2018-03-26 NOTE — ED Triage Notes (Signed)
Pt here from a group home after a trip and fall hitting his head , lac to his head bleeding controlled , no loc

## 2018-03-26 NOTE — Discharge Instructions (Addendum)
Treatment: Keep your wound dry and dressing applied until this time tomorrow. After 24 hours, you may wash with warm soapy water. Dry and apply antibiotic ointment and clean dressing. Do this daily until your sutures are removed.  Follow-up: Please follow-up with your primary care provider or return to emergency department in 7-10 days for suture/staple removal. Be aware of signs of infection: fever, increasing pain, redness, swelling, drainage from the area. Please call your primary care provider or return to emergency department if you develop any of these symptoms or if any of the sutures come out prior to removal. Please return to the emergency department if you develop any other new or worsening symptoms.

## 2018-03-26 NOTE — ED Provider Notes (Signed)
Patient for mechanical fall while getting up off of a chair this morning creating laceration at scalp and forehead.   Doug Sou, MD 03/26/18 1007

## 2018-03-29 ENCOUNTER — Telehealth: Payer: Self-pay | Admitting: Adult Health

## 2018-03-29 NOTE — Telephone Encounter (Signed)
Pts father(Frank on DPR) requesting a call stating the pts PCP would like to discuss lowering the dosage for levETIRAcetam (KEPPRA) 250 MG tablet. Stating he will not be able to speak tomorrow 10/31 but can Friday 11/1. Did not wish to discuss further with me.

## 2018-03-30 NOTE — Telephone Encounter (Signed)
Noted  

## 2018-03-31 NOTE — Telephone Encounter (Signed)
LMVM for father of pt that am returning call to him as requested.  Here until 1200 today.

## 2018-04-03 NOTE — Progress Notes (Signed)
Cardiology Office Note Date:  04/06/2018  Patient ID:  [name] [birth year], MRN [id number] PCP:  Trinidad Curet  Electrophysiologist: Dr. Curt Bears   Chief Complaint: hospital f/u  History of Present Illness: [name] is a 52 y.o. male with history of Down's syndrome, seizure d/o, RBBB.  He was hospitalized in September for weakness, syncope. Review of his record notes: "[name] resides at a group home, is somewhat verbal with his father and caregiver, though not  Otherwise.  Of late his seizure medicines have been being adjusted, his Depakote decreased 2/2 high ammonia level, keppra added along the way and has been titrated as well with change in behavior with some agitation/agression.  His father reports he was recently started on clonidine for treatment of the behavorial issues by one of his doctors, his fist dose Tuesday, for sure got yesterday and this AM.  Today per the story given to the father from the group home is that while at breakfast he slumped over.  Very lethargic, weak, unclear if he had complete LOC.  EMS was called.  Record reviewed, their presenting vitals SB 54 on EKG w/RBBB, 80/p, 95%, described as alert.  Her in the ER EKG was SB 59, 106/59, while here he was observed to have SB into 30's associated with decreased LOC, the father was present for this, said he was unable to arrouse him with sternal rub, he was given -.5atropine with improvement of HR, BP was 83/47 > last 120/70 HR 60's.  In observation the patient remains sleepy, nods off though wakes to his father and verbal.  They report this as very unusual for the patient, usually very alert, active and interactive though not overtly verbal. "  The patient had been started on Clonidine (for agitation), and the patient's father and his caregiver report since then had been unusually lethargic.  [name] father prior to pursuing PPM, wanted to try off the clonidine given small  chance this may have contributed to bradycardia.  F/U the following day noted no recurrent bradycardia and the patient by his father's account back to his very usual, active/interactive baseline.  It was decided to follow up outpatient, no PPM felt indicated, and to avoid any rate limiting/potential nodal blocking agents.  Note 03/26/18 he had a ER visit for a fall without LOC, and laceration.  He is accompanied by his father and a resident care giver.  Since his hospital stay as above, he had a accident that resulted in a large head laceration requiring numerous stitches and stables.  This occurred to the best of knowledge, he was seated ion a recliner, was made aware the the meal was ready, the personel turned to leave the room and heard a thud, did not see him fall.  They report that when he turned saw the patient on the floor and they think he was said to have been awake.  Outside of this event while seated he has had 2 episodes of decreased LOC/lethargy, difficult to arouse. There were no observations of any seizure type movents, more limp/slumped, no reported diaphoresis or pallor, takes a couple minutes for him to come around.  He c/w his neurologist, seen recently and felt to have developed some degree of dementia, pending possible start of aricept or namenda.   Past Medical History:  Diagnosis Date  . Down's syndrome   . GERD (gastroesophageal reflux disease)   . Hypertension   . Hypothyroid   . Lives in group  home    [place]  . Nystagmus   . Obstructive sleep apnea (adult) (pediatric)    does use a cpap at night  . Seizure disorder ([facility]) 10/28/2016  . Seizures ([place])     Past Surgical History:  Procedure Laterality Date  . IRRIGATION AND DEBRIDEMENT  07/01/2014      . ORIF HUMERUS FRACTURE Left 02/05/2013   Procedure: OPEN REDUCTION INTERNAL FIXATION (ORIF) MEDIAL HUMERUS CONDYLE FRACTURE;  Surgeon: Jolyn Nap, MD;  Location:  [facility];  Service: Orthopedics;  Laterality: Left;  . Right Elbow surgery      Current Outpatient Medications  Medication Sig Dispense Refill  . acetaminophen (TYLENOL) 325 MG tablet Take 650 mg by mouth every 6 (six) hours as needed for mild pain.     Marland Kitchen alum & mag hydroxide-simeth (MAALOX/MYLANTA) 200-200-20 MG/5ML suspension Take 15 mLs by mouth every 6 (six) hours as needed for indigestion or heartburn.    . benazepril (LOTENSIN) 10 MG tablet Take 10 mg by mouth every evening.     . calcitonin, salmon, (MIACALCIN/FORTICAL) 200 UNIT/ACT nasal spray Place 1 spray into alternate nostrils daily.    . cholecalciferol (VITAMIN D) 1000 units tablet Take 2,000 Units by mouth daily.    . diphenhydrAMINE (BENADRYL) 25 mg capsule Take 25 mg by mouth every 4 (four) hours as needed for itching or allergies.    . Hypromellose (ARTIFICIAL TEARS OP) Apply 1 drop to eye 3 (three) times daily.     Candace Gallus Care Products (JOHNSONS BABY SHAMPOO) SHAM Apply 1 application topically daily. Mix 1/2 water and 1/2 shampoo, Scrub and apply on eyebrows and eyelid everyday.    . Lacosamide (VIMPAT) 150 MG TABS Take 1 tablet (150 mg total) by mouth 2 (two) times daily. (Patient taking differently: Take 100 mg by mouth 2 (two) times daily. ) 60 tablet 5  . lactulose (CHRONULAC) 10 GM/15ML solution Take 10 g by mouth daily as needed for mild constipation.     Marland Kitchen levothyroxine (SYNTHROID, LEVOTHROID) 100 MCG tablet Take 100 mcg by mouth daily before breakfast.    . loperamide (IMODIUM) 2 MG capsule Take by mouth as needed for diarrhea or loose stools.    . promethazine (PHENERGAN) 25 MG tablet Take 25 mg by mouth every 6 (six) hours as needed for nausea or vomiting.    . pyridOXINE (VITAMIN B-6) 100 MG tablet Take 100 mg by mouth daily.    . SUNSCREEN WUJ81 EX Apply 1 application topically daily as needed (prevent sunburn).    . terbinafine (LAMISIL) 1 % cream Apply 1 application topically daily.     Marland Kitchen  levETIRAcetam (KEPPRA) 250 MG tablet Take 2 tablets (500 mg total) by mouth 2 (two) times daily. 60 tablet 0   No current facility-administered medications for this visit.     Allergies:   Clonidine derivatives   Social History:  The patient  reports that he has never smoked. He has never used smokeless tobacco. He reports that he does not drink alcohol or use drugs.   Family History:  [name] family history includes Dementia in his maternal grandmother; Diabetes in his paternal grandfather; Heart failure in his paternal grandfather; Hypertension in his paternal grandfather.  ROS:  Please see the history of present illness.  All other systems are reviewed and otherwise negative.   PHYSICAL EXAM:  VS:  BP 138/84   Pulse 90   Ht _0  (1.549 m)   Wt 167 lb (75.8  kg)   BMI 31.55 kg/m  BMI: Body mass index is 31.55 kg/m. Well nourished, well developed, in no acute distress  HEENT: normocephalic,clean wound to left forehead, top pf head with intact clean sutures/staples, Down's facies Neck: no JVD, carotid bruits or masses Cardiac:   RRR; no significant murmurs, no rubs, or gallops Lungs: CTA b/l, no wheezing, rhonchi or rales  Abd: soft, nontender MS: no deformity or atrophy Ext: no edema  Skin: warm and dry, no rash Neuro:  Not overtly verbal, does seem to interact well with his father and appears to want to participate in the conversation Psych: he is pleasant and cooperative today   EKG:  Done today shows SR, 90bpm, RBBB  02/24/18: TTE Study Conclusions - Left ventricle: The cavity size was normal. Wall thickness was   normal. Systolic function was normal. The estimated ejection   fraction was in the range of 60% to 65%. Wall motion was normal;   there were no regional wall motion abnormalities. Doppler   parameters are consistent with abnormal left ventricular   relaxation (grade 1 diastolic dysfunction). - Aortic valve: There was trivial  regurgitation. Impressions: - Normal LV systolic function; mild diastolic dysfunction; trace   AI.  Recent Labs: 08/30/2017: ALT 12 02/23/2018: BUN 12; Creatinine, Ser 0.99; Hemoglobin 12.8; Platelets 200; Potassium 4.0; Sodium 138; TSH 0.350  No results found for requested labs within last 8760 hours.   CrCl cannot be calculated (Patient's most recent lab result is older than the maximum 21 days allowed.).   Wt Readings from Last 3 Encounters:  04/06/18 167 lb (75.8 kg)  03/08/18 168 lb 12.8 oz (76.6 kg)  03/08/18 169 lb 3.2 oz (76.7 kg)     Other studies reviewed: Additional studies/records reviewed today include: summarized above  ASSESSMENT AND PLAN:  1. Fall vs syncope/near syncope 2. Episodes of decreased LOC, lethargy      At the initial visit in the ER, EMS documented hypotension with normal pulse, ? If head injury/fall promted by orthostatic event, sitting to standing vitals today are normal. transient bradycardia noted at initial consultation in the ER, though lethergy persisted despite normalization of HR [name] father (an ophthalmologist) also made an observation that when up in his room at the hospital his HR would dip to the 40's without lethargy or any change in LOC.  Episodes not clearly HR related, suspect BP  They feel like they would be able to get vitals done with these episodes (have staff that are trained, and episodes last a few minutes so  should be able to) at the care facility.  For now will leave the benazepril alone.  Also recommend to monitor/encourage adequate PO/water intake.  As previously discussed and his father again confirms, would not be able to successfully get a wearable monitor done.  Can consider a loop, though think it best to try and avoid procedures for the patient. Dr. Curt Bears met with the patient and father today as well.  Disposition: F/u with [place] in 4-6 weeks, sooner if needed,  Current medicines are reviewed at length with the  patient today.  The patient did not have any concerns regarding medicines.  Venetia Night, PA-C 04/06/2018 12:43 PM     [place]  [address] [phone] (office)  [phone] (fax)

## 2018-04-03 NOTE — Telephone Encounter (Signed)
Spoke to father of pt and relayed that was returning call to him regarding pt.  He asked about pt being on keppra same dose (500mg  po BID)  from last office visit.  He was not there at visit.  I relayed that MM/NP did speak to Dr. Meredith Mody about behavioral changes and dosing of keppra.   Dose of keppra was decreased to 250mg  po bid on 03-08-18.  He will call group home and speak to them re: pts behavior if decrease of keppra has made difference in behavior. Pts vimpat was increased 150mg  po bid.   He is wondering if the 6 month RV is to long an interval and will call back and let us know after checking in on pt.

## 2018-04-06 ENCOUNTER — Ambulatory Visit (HOSPITAL_COMMUNITY)
Admission: EM | Admit: 2018-04-06 | Discharge: 2018-04-06 | Disposition: A | Discharge status: Home / Self Care | Payer: Medicare Other | Attending: Family Medicine | Admitting: Family Medicine

## 2018-04-06 ENCOUNTER — Ambulatory Visit (INDEPENDENT_AMBULATORY_CARE_PROVIDER_SITE_OTHER): Payer: Medicare Other | Admitting: Physician Assistant

## 2018-04-06 VITALS — BP 138/84 | HR 90 | Ht 61.0 in | Wt 167.0 lb

## 2018-04-06 DIAGNOSIS — I10 Essential (primary) hypertension: Secondary | ICD-10-CM

## 2018-04-06 DIAGNOSIS — R55 Syncope and collapse: Secondary | ICD-10-CM | POA: Diagnosis not present

## 2018-04-06 DIAGNOSIS — Z6829 Body mass index (BMI) 29.0-29.9, adult: Secondary | ICD-10-CM | POA: Diagnosis not present

## 2018-04-06 DIAGNOSIS — T50905A Adverse effect of unspecified drugs, medicaments and biological substances, initial encounter: Secondary | ICD-10-CM

## 2018-04-06 DIAGNOSIS — E039 Hypothyroidism, unspecified: Secondary | ICD-10-CM | POA: Diagnosis not present

## 2018-04-06 DIAGNOSIS — S0101XA Laceration without foreign body of scalp, initial encounter: Secondary | ICD-10-CM

## 2018-04-06 DIAGNOSIS — G40919 Epilepsy, unspecified, intractable, without status epilepticus: Secondary | ICD-10-CM | POA: Diagnosis not present

## 2018-04-06 DIAGNOSIS — Z4802 Encounter for removal of sutures: Secondary | ICD-10-CM | POA: Diagnosis not present

## 2018-04-06 DIAGNOSIS — R001 Bradycardia, unspecified: Secondary | ICD-10-CM | POA: Diagnosis not present

## 2018-04-06 DIAGNOSIS — Z23 Encounter for immunization: Secondary | ICD-10-CM | POA: Diagnosis not present

## 2018-04-06 DIAGNOSIS — Q909 Down syndrome, unspecified: Secondary | ICD-10-CM | POA: Diagnosis not present

## 2018-04-06 MED ORDER — LEVETIRACETAM 250 MG PO TABS: 500.0000 mg | ORAL_TABLET | Freq: Two times a day (BID) | ORAL | 0 refills | Status: DC

## 2018-04-06 NOTE — ED Triage Notes (Signed)
Pt here for staple and stitch removal to the top of his head and forehead.  Pt mostly nonverbal but shows no sign of pain or discomfort.  The wound is approximated, with complete closure along the forehead with stitches and some scabbing on the top of his head with staples.  Eight stitches were removed and 10 staples removed.  Pt tolerated it well.

## 2018-04-06 NOTE — Patient Instructions (Signed)
Medication Instructions:    NONE ORDERED  TODAY   If you need a refill on your cardiac medications before your next appointment, please call your pharmacy.   Lab work:  NONE ORDERED  TODAY  If you have labs (blood work) drawn today and your tests are completely normal, you will receive your results only by: Marland Kitchen MyChart Message (if you have MyChart) OR . A paper copy in the mail If you have any lab test that is abnormal or we need to change your treatment, we will call you to review the results.  Testing/Procedures:  NONE ORDERED  TODAY   Follow-Up:  At [facility], you and your health needs are our priority.  As part of our continuing mission to provide you with exceptional heart care, we have created designated Provider Care Teams.  These Care Teams include your primary Cardiologist (physician) and Advanced Practice Providers (APPs -  Physician Assistants and Nurse Practitioners) who all work together to provide you with the care you need, when you need it.   You will need a follow up WITH  Dr Elberta Fortis    appointment in 4- 6 weeks.  Please call our office 2 months in advance to schedule this appointment.  You may see  one of the following Advanced Practice Providers on your designated Care Team:   Gypsy Balsam, NP . Francis Dowse, PA-C  Any Other Special Instructions Will Be Listed Below (If Applicable).

## 2018-04-19 DIAGNOSIS — Q909 Down syndrome, unspecified: Secondary | ICD-10-CM | POA: Diagnosis not present

## 2018-04-19 DIAGNOSIS — E039 Hypothyroidism, unspecified: Secondary | ICD-10-CM | POA: Diagnosis not present

## 2018-04-19 DIAGNOSIS — G40919 Epilepsy, unspecified, intractable, without status epilepticus: Secondary | ICD-10-CM | POA: Diagnosis not present

## 2018-04-19 DIAGNOSIS — G26 Extrapyramidal and movement disorders in diseases classified elsewhere: Secondary | ICD-10-CM | POA: Diagnosis not present

## 2018-04-19 DIAGNOSIS — Z6827 Body mass index (BMI) 27.0-27.9, adult: Secondary | ICD-10-CM | POA: Diagnosis not present

## 2018-05-02 DIAGNOSIS — E039 Hypothyroidism, unspecified: Secondary | ICD-10-CM | POA: Diagnosis not present

## 2018-05-02 DIAGNOSIS — L249 Irritant contact dermatitis, unspecified cause: Secondary | ICD-10-CM | POA: Diagnosis not present

## 2018-05-02 DIAGNOSIS — Q909 Down syndrome, unspecified: Secondary | ICD-10-CM | POA: Diagnosis not present

## 2018-05-04 ENCOUNTER — Ambulatory Visit (HOSPITAL_BASED_OUTPATIENT_CLINIC_OR_DEPARTMENT_OTHER): Payer: Medicare Other | Attending: Internal Medicine | Admitting: Internal Medicine

## 2018-05-10 ENCOUNTER — Encounter: Payer: Self-pay | Admitting: Physician Assistant

## 2018-05-16 NOTE — Progress Notes (Signed)
Cardiology Office Note Date:  05/18/2018  Patient ID:  [name], DOB February 14, 1966, MRN [id number] PCP:  [name], [name]  Electrophysiologist: [name]   Chief Complaint: hospital f/u  History of Present Illness: [name] is a 52 y.o. male with history of Down's syndrome, seizure d/o, RBBB.  He was hospitalized in September for weakness, syncope. Review of his record notes: "Mr. [name] resides at a group home, is somewhat verbal with his father and caregiver, though not  Otherwise.  Of late his seizure medicines have been being adjusted, his Depakote decreased 2/2 high ammonia level, keppra added along the way and has been titrated as well with change in behavior with some agitation/agression.  His father reports he was recently started on clonidine for treatment of the behavorial issues by one of his doctors, his fist dose Tuesday, for sure got yesterday and this AM.  Today per the story given to the father from the group home is that while at breakfast he slumped over.  Very lethargic, weak, unclear if he had complete LOC.  EMS was called.  Record reviewed, their presenting vitals SB 54 on EKG w/RBBB, 80/p, 95%, described as alert.  Her in the ER EKG was SB 59, 106/59, while here he was observed to have SB into 30's associated with decreased LOC, the father was present for this, said he was unable to arrouse him with sternal rub, he was given -.5atropine with improvement of HR, BP was 83/47 > last 120/70 HR 60's.  In observation the patient remains sleepy, nods off though wakes to his father and verbal.  They report this as very unusual for the patient, usually very alert, active and interactive though not overtly verbal. "  The patient had been started on Clonidine (for agitation), and the patient's father and his caregiver report since then had been unusually lethargic.  [name] father prior to pursuing PPM, wanted to try off the clonidine given small  chance this may have contributed to bradycardia.  F/U the following day noted no recurrent bradycardia and the patient by his father's account back to his very usual, active/interactive baseline.  It was decided to follow up outpatient, no PPM felt indicated, and to avoid any rate limiting/potential nodal blocking agents.  Note 03/26/18 he had a ER visit for a fall without LOC, and laceration.  I saw him in f/u 04/06/18, he was accompanied by his father and a resident care giver.  Since his hospital stay as above, he had a accident that resulted in a large head laceration requiring numerous stitches and stables.  This occurred to the best of their knowledge, he was seated in a recliner, was made aware the the meal was ready, the personel turned to leave the room and heard a thud, did not see him fall.  They report that when he turned saw the patient on the floor and they think he was said to have been awake.  Outside of this event while seated he has had 2 episodes of decreased LOC/lethargy, difficult to arouse. There were no observations of any seizure type movents, more limp/slumped, no reported diaphoresis or pallor, takes a couple minutes for him to come around.  He c/w his neurologist, seen recently and felt to have developed some degree of dementia, pending possible start of aricept or namenda.  At this visit, [name] was able to step in, in d/w the father and caregiver felt that they would be able (had trained staff)  get his vital signs during these periods of decreased mentation/lethargy, syncope. We had previously discussed and his father again confirms, would not be able to successfully get a wearable monitor done.  Could consider a loop, though everyone felt it best to try and avoid procedures for the patient.   He comes today with his caregiver [name] from [name] (who was with him at his last visit as well), his father not with them today. [name] reports the patient is doing well, has not  had any recurrent spells that he is aware of.  I asked the patient how he was doing, he stated "good", and answered "no" to any pains.  I called the patient's father [name] who is usually with the patient, we spoke via telephone while the patient was here.  He apologized for missing the appointment, but agreed, that there have been no further events of lethargy, syncope, or spells of any kind that he is aware of either.   We discussed that we would keep our stratagey the same with watchful waiting.  He mentioned they continue to discuss getting the patient started on dementia medicines.  He is not convinced they are neccesary or will be beneficial.  And concerns of potential HR affects.  Should they decide to start anything, assured him we/Dr. Elberta Fortis would be happy to discuss with his doctors should they need.   Past Medical History:  Diagnosis Date  . Down's syndrome   . GERD (gastroesophageal reflux disease)   . Hypertension   . Hypothyroid   . Lives in group home    [facility]-[phone]-fax-[phone]-[name]-nurse  . Nystagmus   . Obstructive sleep apnea (adult) (pediatric)    does use a cpap at night  . Seizure disorder ([facility]) 10/28/2016  . Seizures ([facility])     Past Surgical History:  Procedure Laterality Date  . IRRIGATION AND DEBRIDEMENT  07/01/2014      . ORIF HUMERUS FRACTURE Left 02/05/2013   Procedure: OPEN REDUCTION INTERNAL FIXATION (ORIF) MEDIAL HUMERUS CONDYLE FRACTURE;  Surgeon: Manuel Marble, MD;  Location: [facility];  Service: Orthopedics;  Laterality: Left;  . Right Elbow surgery      Current Outpatient Medications  Medication Sig Dispense Refill  . acetaminophen (TYLENOL) 325 MG tablet Take 650 mg by mouth every 6 (six) hours as needed for mild pain.     Marland Kitchen alum & mag hydroxide-simeth (MAALOX/MYLANTA) 200-200-20 MG/5ML suspension Take 15 mLs by mouth every 6 (six) hours as needed for indigestion or heartburn.    . benazepril (LOTENSIN) 10 MG tablet  Take 10 mg by mouth every evening.     . calcitonin, salmon, (MIACALCIN/FORTICAL) 200 UNIT/ACT nasal spray Place 1 spray into alternate nostrils daily.    . cholecalciferol (VITAMIN D) 1000 units tablet Take 2,000 Units by mouth daily.    . diphenhydrAMINE (BENADRYL) 25 mg capsule Take 25 mg by mouth every 4 (four) hours as needed for itching or allergies.    . Hypromellose (ARTIFICIAL TEARS OP) Apply 1 drop to eye 3 (three) times daily.     Thornell Sartorius Care Products (JOHNSONS BABY SHAMPOO) SHAM Apply 1 application topically daily. Mix 1/2 water and 1/2 shampoo, Scrub and apply on eyebrows and eyelid everyday.    . Lacosamide (VIMPAT) 150 MG TABS Take 1 tablet (150 mg total) by mouth 2 (two) times daily. (Patient taking differently: Take 100 mg by mouth 2 (two) times daily. ) 60 tablet 5  . lactulose (CHRONULAC) 10 GM/15ML solution Take 10  g by mouth daily as needed for mild constipation.     . levETIRAcetam (KEPPRA) 250 MG tablet Take 2 tablets (500 mg total) by mouth 2 (two) times daily. 60 tablet 0  . levothyroxine (SYNTHROID, LEVOTHROID) 100 MCG tablet Take 100 mcg by mouth daily before breakfast.    . loperamide (IMODIUM) 2 MG capsule Take by mouth as needed for diarrhea or loose stools.    . promethazine (PHENERGAN) 25 MG tablet Take 25 mg by mouth every 6 (six) hours as needed for nausea or vomiting.    . pyridOXINE (VITAMIN B-6) 100 MG tablet Take 100 mg by mouth daily.    . SUNSCREEN SPF30 EX Apply 1 application topically daily as needed (prevent sunburn).    . terbinafine (LAMISIL) 1 % cream Apply 1 application topically daily.      No current facility-administered medications for this visit.     Allergies:   Clonidine derivatives   Social History:  The patient  reports that he has never smoked. He has never used smokeless tobacco. He reports that he does not drink alcohol or use drugs.   Family History:  [name] family history includes Dementia in his maternal grandmother;  Diabetes in his paternal grandfather; Heart failure in his paternal grandfather; Hypertension in his paternal grandfather.  ROS:  Please see the history of present illness.  All other systems are reviewed and otherwise negative.   PHYSICAL EXAM:  VS:  There were no vitals taken for this visit. BMI: There is no height or weight on file to calculate BMI. Well nourished, well developed, in no acute distress  HEENT: normocephalic,atraumatic (wound has healed well), Down's facies Neck: no JVD, carotid bruits or masses Cardiac:   RRR; no significant murmurs, no rubs, or gallops Lungs: CTA b/l, no wheezing, rhonchi or rales  Abd: soft, nontender MS: no deformity or atrophy Ext: no edema  Skin: warm and dry, no rash Neuro:  Not overtly verbal, does interact more with me today then he has in the past Psych: he is pleasant and cooperative today   EKG:  Not done today  02/24/18: TTE Study Conclusions - Left ventricle: The cavity size was normal. Wall thickness was   normal. Systolic function was normal. The estimated ejection   fraction was in the range of 60% to 65%. Wall motion was normal;   there were no regional wall motion abnormalities. Doppler   parameters are consistent with abnormal left ventricular   relaxation (grade 1 diastolic dysfunction). - Aortic valve: There was trivial regurgitation. Impressions: - Normal LV systolic function; mild diastolic dysfunction; trace   AI.  Recent Labs: 08/30/2017: ALT 12 02/23/2018: BUN 12; Creatinine, Ser 0.99; Hemoglobin 12.8; Platelets 200; Potassium 4.0; Sodium 138; TSH 0.350  No results found for requested labs within last 8760 hours.   CrCl cannot be calculated (Patient's most recent lab result is older than the maximum 21 days allowed.).   Wt Readings from Last 3 Encounters:  04/06/18 167 lb (75.8 kg)  03/08/18 168 lb 12.8 oz (76.6 kg)  03/08/18 169 lb 3.2 oz (76.7 kg)     Other studies reviewed: Additional studies/records  reviewed today include: summarized above  ASSESSMENT AND PLAN:  1. Fall vs syncope/near syncope 2. Episodes of decreased LOC, lethargy     Suspect BP was major driving factor  Not recurrent since his last visit As noted above in d/w the patient's father, will make no changes .   Disposition: See him back in 4  months, sooner if needed.  Current medicines are reviewed at length with the patient today.  The patient did not have any concerns regarding medicines.  Norma Fredrickson, PA-C 05/18/2018 11:50 AM     [facility] [address] [place] [address] [phone] (office)  [phone] (fax)

## 2018-05-18 ENCOUNTER — Ambulatory Visit (INDEPENDENT_AMBULATORY_CARE_PROVIDER_SITE_OTHER): Payer: Medicare Other | Admitting: Physician Assistant

## 2018-05-18 VITALS — BP 136/84 | HR 80 | Ht 61.0 in | Wt 162.0 lb

## 2018-05-18 DIAGNOSIS — R55 Syncope and collapse: Secondary | ICD-10-CM | POA: Diagnosis not present

## 2018-05-18 NOTE — Patient Instructions (Signed)
Medication Instructions:   Your physician recommends that you continue on your current medications as directed. Please refer to the Current Medication list given to you today.  If you need a refill on your cardiac medications before your next appointment, please call your pharmacy.   Lab work: NONE ORDERED  TODAY  If you have labs (blood work) drawn today and your tests are completely normal, you will receive your results only by: Marland Kitchen. MyChart Message (if you have MyChart) OR . A paper copy in the mail If you have any lab test that is abnormal or we need to change your treatment, we will call you to review the results.  Testing/Procedures: NONE ORDERED  TODAY   Follow-Up:  IN 4 MONTHS WITH DR CAMNITZ   Any Other Special Instructions Will Be Listed Below (If Applicable).

## 2018-06-06 DIAGNOSIS — Z6828 Body mass index (BMI) 28.0-28.9, adult: Secondary | ICD-10-CM | POA: Diagnosis not present

## 2018-06-06 DIAGNOSIS — R55 Syncope and collapse: Secondary | ICD-10-CM | POA: Diagnosis not present

## 2018-06-06 DIAGNOSIS — K5900 Constipation, unspecified: Secondary | ICD-10-CM | POA: Diagnosis not present

## 2018-06-06 DIAGNOSIS — G40909 Epilepsy, unspecified, not intractable, without status epilepticus: Secondary | ICD-10-CM | POA: Diagnosis not present

## 2018-06-06 DIAGNOSIS — I10 Essential (primary) hypertension: Secondary | ICD-10-CM | POA: Diagnosis not present

## 2018-06-06 DIAGNOSIS — F79 Unspecified intellectual disabilities: Secondary | ICD-10-CM | POA: Diagnosis not present

## 2018-07-04 DIAGNOSIS — E039 Hypothyroidism, unspecified: Secondary | ICD-10-CM | POA: Diagnosis not present

## 2018-07-04 DIAGNOSIS — G40919 Epilepsy, unspecified, intractable, without status epilepticus: Secondary | ICD-10-CM | POA: Diagnosis not present

## 2018-07-04 DIAGNOSIS — F329 Major depressive disorder, single episode, unspecified: Secondary | ICD-10-CM | POA: Diagnosis not present

## 2018-07-04 DIAGNOSIS — Z6829 Body mass index (BMI) 29.0-29.9, adult: Secondary | ICD-10-CM | POA: Diagnosis not present

## 2018-07-04 DIAGNOSIS — Q909 Down syndrome, unspecified: Secondary | ICD-10-CM | POA: Diagnosis not present

## 2018-07-07 ENCOUNTER — Telehealth: Payer: Self-pay

## 2018-07-07 NOTE — Telephone Encounter (Signed)
Contacted [facility] and left a voicemail on nurses phone to please have CPAP machine come with the patient to the office visit Monday 07/10/18 so we can retrieve the usage data.

## 2018-07-10 ENCOUNTER — Encounter: Payer: Self-pay | Admitting: Internal Medicine

## 2018-07-10 ENCOUNTER — Ambulatory Visit (INDEPENDENT_AMBULATORY_CARE_PROVIDER_SITE_OTHER): Payer: Medicare Other | Admitting: Internal Medicine

## 2018-07-10 VITALS — BP 118/68 | HR 79 | Ht 63.0 in | Wt 164.0 lb

## 2018-07-10 DIAGNOSIS — Q909 Down syndrome, unspecified: Secondary | ICD-10-CM | POA: Diagnosis not present

## 2018-07-10 DIAGNOSIS — G4733 Obstructive sleep apnea (adult) (pediatric): Secondary | ICD-10-CM | POA: Diagnosis not present

## 2018-07-10 NOTE — Progress Notes (Signed)
HPI male Down's Syndrome, followed for OSA, complicated by seizure disorder, GERD, HBP, hypothyroid NPSG 07/15/06- AHI 55.7/ hr, desaturation to 94%, body weight 187 lbs, bruxism . Report is in Epic Notes.10/13/10   ------------------------------------------------------------------------------------------------------------------------------  03/08/2018- 53 year old male Down's Syndrome, followed for OSA, complicated by seizure disorder, GERD, HBP, hypothyroid, bradycardia CPAP auto 5-15/Advanced    Old machine reportedly not replaced 12/23/16 as ordered. -----He pulls off CPAP and has not been wearing it regularly. Father, Dr. Eulah Pont, and staff from nursing home are here today.  He will get his flu shot there.  English used to wear CPAP every night.  Starting about a year ago, possibly when his mask was changed, he began pulling it off immediately and refusing to wear it.  Sleep is described as restless.  He seems extremely tired in the daytime and is being evaluated for hypotension and bradycardia.  Staff note that he seems to sleep much better in recliner chair than in his bed, but chair has been removed from his room.  It can be put back. Liver functions were abnormal and Depakote was changed to Keppra.  Liver function will be reassessed when he follows up this afternoon with neurology.  07/10/2018- 53 year old male Down's Syndrome, followed for OSA, complicated by seizure disorder, GERD, HBP, hypothyroid, bradycardia Order was sent from 03/08/2018 to [facility] to replace old CPAP machine and change to auto 5-15.  -----OSA f/u Father reports they presented for scheduled sleep study but Kairee could not tolerate having the monitor leads attached.  The attempted test was aborted about 10 PM.  He has not been using CPAP.  The attendant from his group home reports "not much snoring".  Huan had lost about 25 pounds when he was changed off of Depakote.  He sleeps in a recliner.  He sleeps more in the  daytime that at night but that is not causing any problems.  ROS-see HPI- as reported by staff assistant with him  + = positive Constitutional:   No-   weight loss, night sweats, fevers, chills, +fatigue, lassitude. HEENT:   No-  headaches, difficulty swallowing, tooth/dental problems, sore throat,       No-  sneezing, itching, ear ache, nasal congestion, post nasal drip,  CV:  No-   chest pain, orthopnea, PND, swelling in lower extremities, anasarca, dizziness, palpitations Resp: No-   shortness of breath with exertion or at rest.              No-   productive cough,  No non-productive cough,  No- coughing up of blood.              No-   change in color of mucus.  No- wheezing.   Skin: No-   rash or lesions. GI:  No-   heartburn, indigestion, abdominal pain, nausea, vomiting,  GU: No-   . MS:  No-   joint pain or swelling.  Neuro-     HPI Psych:  No- change in mood or affect. No depression or anxiety.  No memory loss.  OBJ- Physical Exam General- + very drowsy,  Affect-passive, Distress- none acute,+ non-verbal  Skin-  No breakdown or strap pressure marks. Lymphadenopathy- none Head- atraumatic            Eyes- Gross vision intact, PERRLA, conjunctivae and secretions clear            Ears- Hearing, grossly normal            Nose- Clear, no-Septal dev, mucus, polyps,  erosion, perforation             Throat- Mallampati III-IV , mucosa clear , drainage- none, tonsils- atrophic Neck- flexible , trachea midline, no stridor , thyroid nl, carotid no bruit Chest - symmetrical excursion , unlabored           Heart/CV- RRR , no murmur , no gallop  , no rub, nl s1 s2                           - JVD- none , edema- none, stasis changes- none, varices- none           Lung- clear to P&A, wheeze- none, cough- none , dullness-none, rub- none           Chest wall-  Abd-  Br/ Gen/ Rectal- Not done, not indicated Extrem-  Neuro- +severely retarded, +nystagmus,  Does not seem to comprehend. But more  alert and attentive than previous visits.

## 2018-07-10 NOTE — Assessment & Plan Note (Signed)
Father and I agreed that [name] is doing well enough now without CPAP.  His weight loss, and sleeping in recliner both contribute. There is circadian rhythm sleep disturbance which is harmless.  It is easier on [place] to let him be awake at night and sleep during the day Plan-no changes.  Okay to continue current lifestyle at group home.  We will be happy to see him back if needed.

## 2018-07-10 NOTE — Patient Instructions (Signed)
I am glad [name] is doing better.  Please call if we can help

## 2018-07-10 NOTE — Assessment & Plan Note (Signed)
He is profoundly involved but appears comfortable and well cared for.

## 2018-07-11 DIAGNOSIS — Q909 Down syndrome, unspecified: Secondary | ICD-10-CM | POA: Diagnosis not present

## 2018-07-11 DIAGNOSIS — Z6829 Body mass index (BMI) 29.0-29.9, adult: Secondary | ICD-10-CM | POA: Diagnosis not present

## 2018-07-11 DIAGNOSIS — F79 Unspecified intellectual disabilities: Secondary | ICD-10-CM | POA: Diagnosis not present

## 2018-07-11 DIAGNOSIS — G4733 Obstructive sleep apnea (adult) (pediatric): Secondary | ICD-10-CM | POA: Diagnosis not present

## 2018-07-11 DIAGNOSIS — I10 Essential (primary) hypertension: Secondary | ICD-10-CM | POA: Diagnosis not present

## 2018-07-18 DIAGNOSIS — G26 Extrapyramidal and movement disorders in diseases classified elsewhere: Secondary | ICD-10-CM | POA: Diagnosis not present

## 2018-07-18 DIAGNOSIS — Q909 Down syndrome, unspecified: Secondary | ICD-10-CM | POA: Diagnosis not present

## 2018-07-18 DIAGNOSIS — E039 Hypothyroidism, unspecified: Secondary | ICD-10-CM | POA: Diagnosis not present

## 2018-07-18 DIAGNOSIS — Z6829 Body mass index (BMI) 29.0-29.9, adult: Secondary | ICD-10-CM | POA: Diagnosis not present

## 2018-07-18 DIAGNOSIS — G40919 Epilepsy, unspecified, intractable, without status epilepticus: Secondary | ICD-10-CM | POA: Diagnosis not present

## 2018-07-26 DIAGNOSIS — R892 Abnormal level of other drugs, medicaments and biological substances in specimens from other organs, systems and tissues: Secondary | ICD-10-CM | POA: Diagnosis not present

## 2018-08-01 DIAGNOSIS — G40919 Epilepsy, unspecified, intractable, without status epilepticus: Secondary | ICD-10-CM | POA: Diagnosis not present

## 2018-08-01 DIAGNOSIS — Z6829 Body mass index (BMI) 29.0-29.9, adult: Secondary | ICD-10-CM | POA: Diagnosis not present

## 2018-08-01 DIAGNOSIS — E039 Hypothyroidism, unspecified: Secondary | ICD-10-CM | POA: Diagnosis not present

## 2018-08-01 DIAGNOSIS — Q909 Down syndrome, unspecified: Secondary | ICD-10-CM | POA: Diagnosis not present

## 2018-08-07 DIAGNOSIS — G40919 Epilepsy, unspecified, intractable, without status epilepticus: Secondary | ICD-10-CM | POA: Diagnosis not present

## 2018-08-07 DIAGNOSIS — E039 Hypothyroidism, unspecified: Secondary | ICD-10-CM | POA: Diagnosis not present

## 2018-08-07 DIAGNOSIS — Z6829 Body mass index (BMI) 29.0-29.9, adult: Secondary | ICD-10-CM | POA: Diagnosis not present

## 2018-08-07 DIAGNOSIS — Q909 Down syndrome, unspecified: Secondary | ICD-10-CM | POA: Diagnosis not present

## 2018-08-08 DIAGNOSIS — I10 Essential (primary) hypertension: Secondary | ICD-10-CM | POA: Diagnosis not present

## 2018-08-08 DIAGNOSIS — F79 Unspecified intellectual disabilities: Secondary | ICD-10-CM | POA: Diagnosis not present

## 2018-08-08 DIAGNOSIS — E559 Vitamin D deficiency, unspecified: Secondary | ICD-10-CM | POA: Diagnosis not present

## 2018-08-08 DIAGNOSIS — J309 Allergic rhinitis, unspecified: Secondary | ICD-10-CM | POA: Diagnosis not present

## 2018-08-08 DIAGNOSIS — Z6830 Body mass index (BMI) 30.0-30.9, adult: Secondary | ICD-10-CM | POA: Diagnosis not present

## 2018-08-08 DIAGNOSIS — Q909 Down syndrome, unspecified: Secondary | ICD-10-CM | POA: Diagnosis not present

## 2018-08-08 DIAGNOSIS — E039 Hypothyroidism, unspecified: Secondary | ICD-10-CM | POA: Diagnosis not present

## 2018-09-11 DIAGNOSIS — G40919 Epilepsy, unspecified, intractable, without status epilepticus: Secondary | ICD-10-CM | POA: Diagnosis not present

## 2018-09-11 DIAGNOSIS — E039 Hypothyroidism, unspecified: Secondary | ICD-10-CM | POA: Diagnosis not present

## 2018-09-11 DIAGNOSIS — Z6829 Body mass index (BMI) 29.0-29.9, adult: Secondary | ICD-10-CM | POA: Diagnosis not present

## 2018-09-11 DIAGNOSIS — Q909 Down syndrome, unspecified: Secondary | ICD-10-CM | POA: Diagnosis not present

## 2018-11-07 ENCOUNTER — Telehealth: Payer: Self-pay | Admitting: *Deleted

## 2018-11-07 NOTE — Telephone Encounter (Signed)
Noted  

## 2018-11-07 NOTE — Telephone Encounter (Signed)
Due to current COVID 19 pandemic, our office is severely reducing in office visits until further notice, in order to minimize the risk to our patients and healthcare providers.  She will have someone to call back and set up appt. Convert to [email or web address] when returns call.

## 2018-11-07 NOTE — Telephone Encounter (Signed)
The group home is note allowing pt's out. They will call back and r/s pt's appt later on when they allow pt's out.

## 2018-11-09 ENCOUNTER — Ambulatory Visit: Payer: Medicare Other | Admitting: Adult Health

## 2018-12-27 DIAGNOSIS — G40919 Epilepsy, unspecified, intractable, without status epilepticus: Secondary | ICD-10-CM | POA: Diagnosis not present

## 2018-12-27 DIAGNOSIS — Z6829 Body mass index (BMI) 29.0-29.9, adult: Secondary | ICD-10-CM | POA: Diagnosis not present

## 2018-12-27 DIAGNOSIS — Q909 Down syndrome, unspecified: Secondary | ICD-10-CM | POA: Diagnosis not present

## 2018-12-27 DIAGNOSIS — E039 Hypothyroidism, unspecified: Secondary | ICD-10-CM | POA: Diagnosis not present

## 2019-01-02 DIAGNOSIS — F329 Major depressive disorder, single episode, unspecified: Secondary | ICD-10-CM | POA: Diagnosis not present

## 2019-01-05 DIAGNOSIS — E559 Vitamin D deficiency, unspecified: Secondary | ICD-10-CM | POA: Diagnosis not present

## 2019-01-05 DIAGNOSIS — E039 Hypothyroidism, unspecified: Secondary | ICD-10-CM | POA: Diagnosis not present

## 2019-01-11 DIAGNOSIS — G40919 Epilepsy, unspecified, intractable, without status epilepticus: Secondary | ICD-10-CM | POA: Diagnosis not present

## 2019-01-11 DIAGNOSIS — Q909 Down syndrome, unspecified: Secondary | ICD-10-CM | POA: Diagnosis not present

## 2019-01-11 DIAGNOSIS — E039 Hypothyroidism, unspecified: Secondary | ICD-10-CM | POA: Diagnosis not present

## 2019-01-16 DIAGNOSIS — G26 Extrapyramidal and movement disorders in diseases classified elsewhere: Secondary | ICD-10-CM | POA: Diagnosis not present

## 2019-01-16 DIAGNOSIS — E039 Hypothyroidism, unspecified: Secondary | ICD-10-CM | POA: Diagnosis not present

## 2019-01-16 DIAGNOSIS — G40919 Epilepsy, unspecified, intractable, without status epilepticus: Secondary | ICD-10-CM | POA: Diagnosis not present

## 2019-01-16 DIAGNOSIS — Q909 Down syndrome, unspecified: Secondary | ICD-10-CM | POA: Diagnosis not present

## 2019-01-17 ENCOUNTER — Encounter (HOSPITAL_COMMUNITY): Payer: Self-pay

## 2019-01-17 ENCOUNTER — Emergency Department (HOSPITAL_COMMUNITY)
Admission: EM | Admit: 2019-01-17 | Discharge: 2019-01-17 | Disposition: A | Discharge status: Home / Self Care | Payer: Medicare Other | Attending: Emergency Medicine | Admitting: Emergency Medicine

## 2019-01-17 ENCOUNTER — Other Ambulatory Visit: Payer: Self-pay

## 2019-01-17 DIAGNOSIS — R5381 Other malaise: Secondary | ICD-10-CM | POA: Diagnosis not present

## 2019-01-17 DIAGNOSIS — Y999 Unspecified external cause status: Secondary | ICD-10-CM | POA: Insufficient documentation

## 2019-01-17 DIAGNOSIS — Q909 Down syndrome, unspecified: Secondary | ICD-10-CM | POA: Insufficient documentation

## 2019-01-17 DIAGNOSIS — Z79899 Other long term (current) drug therapy: Secondary | ICD-10-CM | POA: Insufficient documentation

## 2019-01-17 DIAGNOSIS — Y92019 Unspecified place in single-family (private) house as the place of occurrence of the external cause: Secondary | ICD-10-CM | POA: Diagnosis not present

## 2019-01-17 DIAGNOSIS — R69 Illness, unspecified: Secondary | ICD-10-CM | POA: Diagnosis not present

## 2019-01-17 DIAGNOSIS — E039 Hypothyroidism, unspecified: Secondary | ICD-10-CM | POA: Diagnosis not present

## 2019-01-17 DIAGNOSIS — W19XXXA Unspecified fall, initial encounter: Secondary | ICD-10-CM | POA: Insufficient documentation

## 2019-01-17 DIAGNOSIS — K0381 Cracked tooth: Secondary | ICD-10-CM | POA: Diagnosis not present

## 2019-01-17 DIAGNOSIS — Y939 Activity, unspecified: Secondary | ICD-10-CM | POA: Diagnosis not present

## 2019-01-17 DIAGNOSIS — S025XXA Fracture of tooth (traumatic), initial encounter for closed fracture: Secondary | ICD-10-CM | POA: Diagnosis not present

## 2019-01-17 DIAGNOSIS — I10 Essential (primary) hypertension: Secondary | ICD-10-CM | POA: Insufficient documentation

## 2019-01-17 NOTE — ED Provider Notes (Signed)
[facility]-EMERGENCY DEPT Provider Note   CSN: [id number] Arrival date & time: 01/17/19  1829     History   Chief Complaint Chief Complaint  Patient presents with  . Fall  . chipped tooth    HPI [name] is a 53 y.o. male.     HPI Patient has history of Down syndrome and is nonverbal.  Level 5 caveat applies.  Lives in a group home.  Had unwitnessed fall and brought in for upper lip and tooth injury.  Has been acting at his baseline per group home staff. Past Medical History:  Diagnosis Date  . Down's syndrome   . GERD (gastroesophageal reflux disease)   . Hypertension   . Hypothyroid   . Lives in group home    [phone]-nurse  . Nystagmus   . Obstructive sleep apnea (adult) (pediatric)    does use a cpap at night  . Seizure disorder (HCC) 10/28/2016  . Seizures [facility](HCC)     Patient Active Problem List   Diagnosis Date Noted  . Bradycardia, drug induced 02/24/2018  . Syncope 02/23/2018  . Seizure disorder (HCC) 10/28/2016  . Obstructive sleep apnea 07/05/2008  . Down syndrome 07/05/2008    Past Surgical History:  Procedure Laterality Date  . IRRIGATION AND DEBRIDEMENT  07/01/2014      . ORIF HUMERUS FRACTURE Left 02/05/2013   Procedure: OPEN REDUCTION INTERNAL FIXATION (ORIF) MEDIAL HUMERUS CONDYLE FRACTURE;  Surgeon: Jodi Marbleavid A Thompson, MD;  Location: [facility];  Service: Orthopedics;  Laterality: Left;  . Right Elbow surgery          Home Medications    Prior to Admission medications   Medication Sig Start Date End Date Taking? Authorizing Provider  acetaminophen (TYLENOL) 325 MG tablet Take 650 mg by mouth every 6 (six) hours as needed for mild pain.     [provider]  alum & mag hydroxide-simeth (MAALOX/MYLANTA) 200-200-20 MG/5ML suspension Take 15 mLs by mouth every 6 (six) hours as needed for indigestion or heartburn.    [provider]  Artificial  Tear Ointment (LACRI-LUBE OP) Apply to eye.    [provider]  benazepril (LOTENSIN) 10 MG tablet Take 10 mg by mouth every evening.     [provider]  bisacodyl (DULCOLAX) 10 MG suppository Place 10 mg rectally as needed for moderate constipation.    [provider]  calcitonin, salmon, (MIACALCIN/FORTICAL) 200 UNIT/ACT nasal spray Place 1 spray into alternate nostrils daily.    [provider]  cholecalciferol (VITAMIN D) 1000 units tablet Take 2,000 Units by mouth daily.    [provider]  diphenhydrAMINE (BENADRYL) 25 mg capsule Take 25 mg by mouth every 4 (four) hours as needed for itching or allergies.    [provider]  doxycycline (VIBRA-TABS) 100 MG tablet  06/18/18   [provider]  Hypromellose (ARTIFICIAL TEARS OP) Apply 1 drop to eye 3 (three) times daily.     [provider]  Infant Care Products (JOHNSONS BABY SHAMPOO) SHAM Apply 1 application topically daily. Mix 1/2 water and 1/2 shampoo, Scrub and apply on eyebrows and eyelid everyday.    [provider]  Lacosamide (VIMPAT) 150 MG TABS Take 1 tablet (150 mg total) by mouth 2 (two) times daily. Patient taking differently: Take 100 mg by mouth 2 (two) times daily.  03/08/18   Butch PennyMillikan, Megan, NP  lactulose (CHRONULAC) 10 GM/15ML solution Take 10 g by mouth daily as needed for  mild constipation.     [provider]  levETIRAcetam (KEPPRA) 250 MG tablet Take 2 tablets (500 mg total) by mouth 2 (two) times daily. 04/06/18   Baldwin Jamaica, PA-C  levothyroxine (SYNTHROID, LEVOTHROID) 100 MCG tablet Take 100 mcg by mouth daily before breakfast.    [provider]  loperamide (IMODIUM) 2 MG capsule Take by mouth as needed for diarrhea or loose stools.    [provider]  Omega-3 Fatty Acids (FISH OIL) 500 MG CAPS Take by mouth 3 (three) times daily.    [provider]  promethazine (PHENERGAN) 25 MG tablet Take 25 mg  by mouth every 6 (six) hours as needed for nausea or vomiting.    [provider]  pyridOXINE (VITAMIN B-6) 100 MG tablet Take 100 mg by mouth daily.    [provider]  SUNSCREEN XHB71 EX Apply 1 application topically daily as needed (prevent sunburn).    [provider]  terbinafine (LAMISIL) 1 % cream Apply 1 application topically daily.     [provider]    Family History Family History  Problem Relation Age of Onset  . Dementia Maternal Grandmother   . Diabetes Paternal Grandfather   . Heart failure Paternal Grandfather   . Hypertension Paternal Grandfather     Social History Social History   Tobacco Use  . Smoking status: Never Smoker  . Smokeless tobacco: Never Used  Substance Use Topics  . Alcohol use: No  . Drug use: No     Allergies   Clonidine derivatives   Review of Systems Review of Systems  Unable to perform ROS: Patient nonverbal     Physical Exam Updated Vital Signs BP (!) 147/97 (BP Location: Left Arm)   Pulse 88   Temp (!) 97.4 F (36.3 C) (Oral)   Resp 18   SpO2 99%   Physical Exam Vitals signs and nursing note reviewed.  Constitutional:      Appearance: Normal appearance. He is well-developed.  HENT:     Head: Normocephalic.     Comments: Patient with mild swelling and abrasion to the mucosal surface of the upper lip.  Patient has a fracture with a small amount of dentin exposed on tooth #8.  Teeth are otherwise stable.  Midface is stable.  No malocclusion.    Nose: Nose normal.     Mouth/Throat:     Mouth: Mucous membranes are moist.  Eyes:     Extraocular Movements: Extraocular movements intact.     Pupils: Pupils are equal, round, and reactive to light.  Neck:     Musculoskeletal: Normal range of motion and neck supple.     Comments: No posterior midline cervical tenderness to palpation. Cardiovascular:     Rate and Rhythm: Normal rate.  Pulmonary:     Effort: Pulmonary effort is normal.   Abdominal:     Palpations: Abdomen is soft.  Musculoskeletal: Normal range of motion.        General: No tenderness.  Skin:    General: Skin is warm and dry.     Findings: No erythema or rash.  Neurological:     General: No focal deficit present.     Mental Status: He is alert.     Comments: Follows commands.  Moving all extremities without deficit.  Sensation intact.  Psychiatric:        Behavior: Behavior normal.      ED Treatments / Results  Labs (all labs ordered are listed, but only  abnormal results are displayed) Labs Reviewed - No data to display  EKG None  Radiology No results found.  Procedures Procedures (including critical care time)  Medications Ordered in ED Medications - No data to display   Initial Impression / Assessment and Plan / ED Course  I have reviewed the triage vital signs and the nursing notes.  Pertinent labs & imaging results that were available during my care of the patient were reviewed by me and considered in my medical decision making (see chart for details).        Will have follow-up with his dentist.  No other significant injuries appreciated.  Final Clinical Impressions(s) / ED Diagnoses   Final diagnoses:  Closed fracture of tooth, initial encounter    ED Discharge Orders    None       Loren RacerYelverton, Giovanni Biby, MD 01/17/19 2107

## 2019-01-17 NOTE — Discharge Instructions (Addendum)
Call your dentist tomorrow morning to make an appointment to follow-up in the next 24 to 48 hours.

## 2019-01-17 NOTE — ED Triage Notes (Addendum)
Per EMs- Patient lives in a group home and had an unwitnessed fall while going to dinner. Patient has a chipped tooth and part of tooth sent with the patient. No LOC according to staff. No blood thinners.  Unable to complete triage due to patient having Down's Syndrome and no Group Home staff available at this time.

## 2019-01-30 DIAGNOSIS — G40919 Epilepsy, unspecified, intractable, without status epilepticus: Secondary | ICD-10-CM | POA: Diagnosis not present

## 2019-01-30 DIAGNOSIS — E039 Hypothyroidism, unspecified: Secondary | ICD-10-CM | POA: Diagnosis not present

## 2019-01-30 DIAGNOSIS — Q909 Down syndrome, unspecified: Secondary | ICD-10-CM | POA: Diagnosis not present

## 2019-03-05 DIAGNOSIS — E039 Hypothyroidism, unspecified: Secondary | ICD-10-CM | POA: Diagnosis not present

## 2019-03-14 DIAGNOSIS — G40919 Epilepsy, unspecified, intractable, without status epilepticus: Secondary | ICD-10-CM | POA: Diagnosis not present

## 2019-03-14 DIAGNOSIS — E039 Hypothyroidism, unspecified: Secondary | ICD-10-CM | POA: Diagnosis not present

## 2019-03-14 DIAGNOSIS — Q909 Down syndrome, unspecified: Secondary | ICD-10-CM | POA: Diagnosis not present

## 2019-03-20 DIAGNOSIS — Z6829 Body mass index (BMI) 29.0-29.9, adult: Secondary | ICD-10-CM | POA: Diagnosis not present

## 2019-03-20 DIAGNOSIS — Z789 Other specified health status: Secondary | ICD-10-CM | POA: Diagnosis not present

## 2019-03-20 DIAGNOSIS — Z23 Encounter for immunization: Secondary | ICD-10-CM | POA: Diagnosis not present

## 2019-03-20 DIAGNOSIS — E039 Hypothyroidism, unspecified: Secondary | ICD-10-CM | POA: Diagnosis not present

## 2019-03-20 DIAGNOSIS — G40919 Epilepsy, unspecified, intractable, without status epilepticus: Secondary | ICD-10-CM | POA: Diagnosis not present

## 2019-03-20 DIAGNOSIS — Q909 Down syndrome, unspecified: Secondary | ICD-10-CM | POA: Diagnosis not present

## 2019-04-05 DIAGNOSIS — Z23 Encounter for immunization: Secondary | ICD-10-CM | POA: Diagnosis not present

## 2019-04-10 DIAGNOSIS — E039 Hypothyroidism, unspecified: Secondary | ICD-10-CM | POA: Diagnosis not present

## 2019-04-10 DIAGNOSIS — Q909 Down syndrome, unspecified: Secondary | ICD-10-CM | POA: Diagnosis not present

## 2019-04-10 DIAGNOSIS — G40919 Epilepsy, unspecified, intractable, without status epilepticus: Secondary | ICD-10-CM | POA: Diagnosis not present

## 2019-04-17 DIAGNOSIS — E039 Hypothyroidism, unspecified: Secondary | ICD-10-CM | POA: Diagnosis not present

## 2019-04-17 DIAGNOSIS — G40919 Epilepsy, unspecified, intractable, without status epilepticus: Secondary | ICD-10-CM | POA: Diagnosis not present

## 2019-04-17 DIAGNOSIS — Q909 Down syndrome, unspecified: Secondary | ICD-10-CM | POA: Diagnosis not present

## 2019-06-08 DIAGNOSIS — Z23 Encounter for immunization: Secondary | ICD-10-CM | POA: Diagnosis not present

## 2019-07-06 DIAGNOSIS — Z23 Encounter for immunization: Secondary | ICD-10-CM | POA: Diagnosis not present

## 2019-07-11 DIAGNOSIS — G40919 Epilepsy, unspecified, intractable, without status epilepticus: Secondary | ICD-10-CM | POA: Diagnosis not present

## 2019-07-11 DIAGNOSIS — Z6829 Body mass index (BMI) 29.0-29.9, adult: Secondary | ICD-10-CM | POA: Diagnosis not present

## 2019-07-11 DIAGNOSIS — E039 Hypothyroidism, unspecified: Secondary | ICD-10-CM | POA: Diagnosis not present

## 2019-07-11 DIAGNOSIS — Q909 Down syndrome, unspecified: Secondary | ICD-10-CM | POA: Diagnosis not present

## 2019-07-12 DIAGNOSIS — G40909 Epilepsy, unspecified, not intractable, without status epilepticus: Secondary | ICD-10-CM | POA: Diagnosis not present

## 2019-07-12 DIAGNOSIS — H903 Sensorineural hearing loss, bilateral: Secondary | ICD-10-CM | POA: Diagnosis not present

## 2019-07-12 DIAGNOSIS — H6123 Impacted cerumen, bilateral: Secondary | ICD-10-CM | POA: Diagnosis not present

## 2019-07-12 DIAGNOSIS — Q909 Down syndrome, unspecified: Secondary | ICD-10-CM | POA: Diagnosis not present

## 2019-07-23 DIAGNOSIS — Z0110 Encounter for examination of ears and hearing without abnormal findings: Secondary | ICD-10-CM | POA: Diagnosis not present

## 2019-10-10 DIAGNOSIS — Z6828 Body mass index (BMI) 28.0-28.9, adult: Secondary | ICD-10-CM | POA: Diagnosis not present

## 2019-10-10 DIAGNOSIS — E039 Hypothyroidism, unspecified: Secondary | ICD-10-CM | POA: Diagnosis not present

## 2019-10-10 DIAGNOSIS — G40919 Epilepsy, unspecified, intractable, without status epilepticus: Secondary | ICD-10-CM | POA: Diagnosis not present

## 2019-10-10 DIAGNOSIS — E7800 Pure hypercholesterolemia, unspecified: Secondary | ICD-10-CM | POA: Diagnosis not present

## 2019-10-10 DIAGNOSIS — M816 Localized osteoporosis [Lequesne]: Secondary | ICD-10-CM | POA: Diagnosis not present

## 2019-10-17 DIAGNOSIS — Q909 Down syndrome, unspecified: Secondary | ICD-10-CM | POA: Diagnosis not present

## 2019-10-17 DIAGNOSIS — G40919 Epilepsy, unspecified, intractable, without status epilepticus: Secondary | ICD-10-CM | POA: Diagnosis not present

## 2019-10-17 DIAGNOSIS — Z6829 Body mass index (BMI) 29.0-29.9, adult: Secondary | ICD-10-CM | POA: Diagnosis not present

## 2019-10-17 DIAGNOSIS — E039 Hypothyroidism, unspecified: Secondary | ICD-10-CM | POA: Diagnosis not present

## 2019-12-17 DIAGNOSIS — Q909 Down syndrome, unspecified: Secondary | ICD-10-CM | POA: Diagnosis not present

## 2019-12-17 DIAGNOSIS — H0288B Meibomian gland dysfunction left eye, upper and lower eyelids: Secondary | ICD-10-CM | POA: Diagnosis not present

## 2019-12-17 DIAGNOSIS — H0288A Meibomian gland dysfunction right eye, upper and lower eyelids: Secondary | ICD-10-CM | POA: Diagnosis not present

## 2019-12-17 DIAGNOSIS — H4423 Degenerative myopia, bilateral: Secondary | ICD-10-CM | POA: Diagnosis not present

## 2019-12-18 DIAGNOSIS — Z6830 Body mass index (BMI) 30.0-30.9, adult: Secondary | ICD-10-CM | POA: Diagnosis not present

## 2019-12-18 DIAGNOSIS — G40909 Epilepsy, unspecified, not intractable, without status epilepticus: Secondary | ICD-10-CM | POA: Diagnosis not present

## 2019-12-18 DIAGNOSIS — Z0001 Encounter for general adult medical examination with abnormal findings: Secondary | ICD-10-CM | POA: Diagnosis not present

## 2019-12-18 DIAGNOSIS — K5900 Constipation, unspecified: Secondary | ICD-10-CM | POA: Diagnosis not present

## 2019-12-18 DIAGNOSIS — G4733 Obstructive sleep apnea (adult) (pediatric): Secondary | ICD-10-CM | POA: Diagnosis not present

## 2019-12-18 DIAGNOSIS — K219 Gastro-esophageal reflux disease without esophagitis: Secondary | ICD-10-CM | POA: Diagnosis not present

## 2019-12-18 DIAGNOSIS — F71 Moderate intellectual disabilities: Secondary | ICD-10-CM | POA: Diagnosis not present

## 2019-12-18 DIAGNOSIS — R1310 Dysphagia, unspecified: Secondary | ICD-10-CM | POA: Diagnosis not present

## 2019-12-18 DIAGNOSIS — E039 Hypothyroidism, unspecified: Secondary | ICD-10-CM | POA: Diagnosis not present

## 2020-01-03 DIAGNOSIS — N429 Disorder of prostate, unspecified: Secondary | ICD-10-CM | POA: Diagnosis not present

## 2020-01-03 DIAGNOSIS — Z0000 Encounter for general adult medical examination without abnormal findings: Secondary | ICD-10-CM | POA: Diagnosis not present

## 2020-01-03 DIAGNOSIS — E785 Hyperlipidemia, unspecified: Secondary | ICD-10-CM | POA: Diagnosis not present

## 2020-01-03 DIAGNOSIS — E039 Hypothyroidism, unspecified: Secondary | ICD-10-CM | POA: Diagnosis not present

## 2020-01-03 DIAGNOSIS — I10 Essential (primary) hypertension: Secondary | ICD-10-CM | POA: Diagnosis not present

## 2020-01-03 DIAGNOSIS — D638 Anemia in other chronic diseases classified elsewhere: Secondary | ICD-10-CM | POA: Diagnosis not present

## 2020-01-03 DIAGNOSIS — E559 Vitamin D deficiency, unspecified: Secondary | ICD-10-CM | POA: Diagnosis not present

## 2020-01-03 DIAGNOSIS — Q901 Trisomy 21, mosaicism (mitotic nondisjunction): Secondary | ICD-10-CM | POA: Diagnosis not present

## 2020-01-03 DIAGNOSIS — D649 Anemia, unspecified: Secondary | ICD-10-CM | POA: Diagnosis not present

## 2020-01-09 DIAGNOSIS — Z6829 Body mass index (BMI) 29.0-29.9, adult: Secondary | ICD-10-CM | POA: Diagnosis not present

## 2020-01-09 DIAGNOSIS — E039 Hypothyroidism, unspecified: Secondary | ICD-10-CM | POA: Diagnosis not present

## 2020-01-09 DIAGNOSIS — G40919 Epilepsy, unspecified, intractable, without status epilepticus: Secondary | ICD-10-CM | POA: Diagnosis not present

## 2020-01-09 DIAGNOSIS — Q909 Down syndrome, unspecified: Secondary | ICD-10-CM | POA: Diagnosis not present

## 2020-01-30 DIAGNOSIS — G40919 Epilepsy, unspecified, intractable, without status epilepticus: Secondary | ICD-10-CM | POA: Diagnosis not present

## 2020-01-30 DIAGNOSIS — M816 Localized osteoporosis [Lequesne]: Secondary | ICD-10-CM | POA: Diagnosis not present

## 2020-01-30 DIAGNOSIS — E039 Hypothyroidism, unspecified: Secondary | ICD-10-CM | POA: Diagnosis not present

## 2020-04-01 DIAGNOSIS — E039 Hypothyroidism, unspecified: Secondary | ICD-10-CM | POA: Diagnosis not present

## 2020-04-01 DIAGNOSIS — G40919 Epilepsy, unspecified, intractable, without status epilepticus: Secondary | ICD-10-CM | POA: Diagnosis not present

## 2020-04-01 DIAGNOSIS — M816 Localized osteoporosis [Lequesne]: Secondary | ICD-10-CM | POA: Diagnosis not present

## 2020-04-01 DIAGNOSIS — Z23 Encounter for immunization: Secondary | ICD-10-CM | POA: Diagnosis not present

## 2020-04-16 DIAGNOSIS — E039 Hypothyroidism, unspecified: Secondary | ICD-10-CM | POA: Diagnosis not present

## 2020-04-16 DIAGNOSIS — Q909 Down syndrome, unspecified: Secondary | ICD-10-CM | POA: Diagnosis not present

## 2020-04-16 DIAGNOSIS — G40919 Epilepsy, unspecified, intractable, without status epilepticus: Secondary | ICD-10-CM | POA: Diagnosis not present

## 2020-05-20 DIAGNOSIS — Z79899 Other long term (current) drug therapy: Secondary | ICD-10-CM | POA: Diagnosis not present

## 2020-05-20 DIAGNOSIS — G40909 Epilepsy, unspecified, not intractable, without status epilepticus: Secondary | ICD-10-CM | POA: Diagnosis not present

## 2020-05-20 DIAGNOSIS — Z6829 Body mass index (BMI) 29.0-29.9, adult: Secondary | ICD-10-CM | POA: Diagnosis not present

## 2020-05-20 DIAGNOSIS — F71 Moderate intellectual disabilities: Secondary | ICD-10-CM | POA: Diagnosis not present

## 2020-05-20 DIAGNOSIS — I10 Essential (primary) hypertension: Secondary | ICD-10-CM | POA: Diagnosis not present

## 2020-12-11 ENCOUNTER — Emergency Department (HOSPITAL_COMMUNITY): Payer: Medicare Other

## 2020-12-11 ENCOUNTER — Emergency Department (HOSPITAL_COMMUNITY)
Admission: EM | Admit: 2020-12-11 | Discharge: 2020-12-11 | Disposition: A | Discharge status: Home / Self Care | Payer: Medicare Other | Attending: Emergency Medicine | Admitting: Emergency Medicine

## 2020-12-11 ENCOUNTER — Other Ambulatory Visit: Payer: Self-pay

## 2020-12-11 DIAGNOSIS — Y92009 Unspecified place in unspecified non-institutional (private) residence as the place of occurrence of the external cause: Secondary | ICD-10-CM | POA: Insufficient documentation

## 2020-12-11 DIAGNOSIS — W19XXXA Unspecified fall, initial encounter: Secondary | ICD-10-CM | POA: Insufficient documentation

## 2020-12-11 DIAGNOSIS — I10 Essential (primary) hypertension: Secondary | ICD-10-CM | POA: Insufficient documentation

## 2020-12-11 DIAGNOSIS — S0181XA Laceration without foreign body of other part of head, initial encounter: Secondary | ICD-10-CM

## 2020-12-11 DIAGNOSIS — E039 Hypothyroidism, unspecified: Secondary | ICD-10-CM | POA: Diagnosis not present

## 2020-12-11 DIAGNOSIS — Z23 Encounter for immunization: Secondary | ICD-10-CM | POA: Diagnosis not present

## 2020-12-11 DIAGNOSIS — S0993XA Unspecified injury of face, initial encounter: Secondary | ICD-10-CM | POA: Diagnosis present

## 2020-12-11 DIAGNOSIS — Z79899 Other long term (current) drug therapy: Secondary | ICD-10-CM | POA: Diagnosis not present

## 2020-12-11 MED ORDER — LIDOCAINE-EPINEPHRINE-TETRACAINE (LET) TOPICAL GEL
3.0000 mL | Freq: Once | TOPICAL | Status: AC
  Administered 2020-12-11: 3 mL via TOPICAL
  Filled 2020-12-11: qty 3

## 2020-12-11 MED ORDER — TETANUS-DIPHTH-ACELL PERTUSSIS 5-2.5-18.5 LF-MCG/0.5 IM SUSY
0.5000 mL | PREFILLED_SYRINGE | Freq: Once | INTRAMUSCULAR | Status: AC
  Administered 2020-12-11: 0.5 mL via INTRAMUSCULAR
  Filled 2020-12-11: qty 0.5

## 2020-12-11 MED ORDER — HALOPERIDOL LACTATE 5 MG/ML IJ SOLN: 5.0000 mg | Freq: Once | INTRAMUSCULAR | Status: DC

## 2020-12-11 MED ORDER — LORAZEPAM 2 MG/ML IJ SOLN
2.0000 mg | Freq: Once | INTRAMUSCULAR | Status: AC
  Administered 2020-12-11: 2 mg via INTRAMUSCULAR
  Filled 2020-12-11: qty 1

## 2020-12-11 MED ORDER — HALOPERIDOL LACTATE 5 MG/ML IJ SOLN
4.0000 mg | Freq: Once | INTRAMUSCULAR | Status: AC
  Administered 2020-12-11: 4 mg via INTRAMUSCULAR
  Filled 2020-12-11: qty 1

## 2020-12-11 NOTE — ED Notes (Signed)
[name] RN at facility given report.

## 2020-12-11 NOTE — ED Triage Notes (Signed)
Patient presents from a group home with a laceration to the bottom of his chin. He sustained this injury from a fall while trying to put on a pair of pants. Bleeding has been controlled. He is non verbal and was uncooperative with EMS.    EMS vitals: 144/76 BP 75 HR

## 2020-12-11 NOTE — ED Notes (Signed)
[name], patients care giver called and transporting patient back to facility.

## 2020-12-11 NOTE — ED Notes (Signed)
Patient unable to lie flat for CT scan. Dr. Dalene Seltzer ordering medication.

## 2020-12-11 NOTE — ED Notes (Signed)
Patients caregiver, Jeannette Corpus [phone], is sitting outside ER and will drive patient back to facility.

## 2020-12-11 NOTE — ED Provider Notes (Signed)
[facility]-EMERGENCY DEPT Provider Note   CSN: [id number] Arrival date & time: 12/11/20  1657     History Chief Complaint  Patient presents with   Fall    [name] is a 55 y.o. male.  HPI     55yo male with history of Down's Syndrome, hypertension, hypothyroidism, seizures, OSA, nystagmus, resides in group home presents with concern for fall.  Per EMS, the group home called after he had had a fall with head trauma and laceration to his chin.  They report he was trying to put on pants that were too long for him, and tripped and fell, hitting the bottom of his chin.  He is nonverbal, however they report he is at his baseline.  He is not on anticoagulation.  They do not have other medical concerns or concerns for other injuries per EMS.  History is limited as patient is nonverbal.  Past Medical History:  Diagnosis Date   Down's syndrome    GERD (gastroesophageal reflux disease)    Hypertension    Hypothyroid    Lives in group home    [phone]-Roxanne-nurse   Nystagmus    Obstructive sleep apnea (adult) (pediatric)    does use a cpap at night   Seizure disorder (HCC) 10/28/2016   Seizures (HCC)     Patient Active Problem List   Diagnosis Date Noted   Bradycardia, drug induced 02/24/2018   Syncope 02/23/2018   Seizure disorder (HCC) 10/28/2016   Obstructive sleep apnea 07/05/2008   Down syndrome 07/05/2008    Past Surgical History:  Procedure Laterality Date   IRRIGATION AND DEBRIDEMENT  07/01/2014       ORIF HUMERUS FRACTURE Left 02/05/2013   Procedure: OPEN REDUCTION INTERNAL FIXATION (ORIF) MEDIAL HUMERUS CONDYLE FRACTURE;  Surgeon: Jodi Marble, MD;  Location:  SURGERY CENTER;  Service: Orthopedics;  Laterality: Left;   Right Elbow surgery         Family History  Problem Relation Age of Onset   Dementia Maternal Grandmother    Diabetes Paternal Grandfather    Heart failure Paternal  Grandfather    Hypertension Paternal Grandfather     Social History   Tobacco Use   Smoking status: Never   Smokeless tobacco: Never  Vaping Use   Vaping Use: Never used  Substance Use Topics   Alcohol use: No   Drug use: No    Home Medications Prior to Admission medications   Medication Sig Start Date End Date Taking? Authorizing Provider  acetaminophen (TYLENOL) 325 MG tablet Take 650 mg by mouth every 6 (six) hours as needed for mild pain.     [provider]  alum & mag hydroxide-simeth (MAALOX/MYLANTA) 200-200-20 MG/5ML suspension Take 15 mLs by mouth every 6 (six) hours as needed for indigestion or heartburn.    [provider]  Artificial Tear Ointment (LACRI-LUBE OP) Apply to eye.    [provider]  benazepril (LOTENSIN) 10 MG tablet Take 10 mg by mouth every evening.     [provider]  bisacodyl (DULCOLAX) 10 MG suppository Place 10 mg rectally as needed for moderate constipation.    [provider]  calcitonin, salmon, (MIACALCIN/FORTICAL) 200 UNIT/ACT nasal spray Place 1 spray into alternate nostrils daily.    [provider]  cholecalciferol (VITAMIN D) 1000 units tablet Take 2,000 Units by mouth daily.    [provider]  diphenhydrAMINE (BENADRYL) 25 mg capsule Take 25 mg by mouth every 4 (four)  hours as needed for itching or allergies.    [provider]  doxycycline (VIBRA-TABS) 100 MG tablet  06/18/18   [provider]  Hypromellose (ARTIFICIAL TEARS OP) Apply 1 drop to eye 3 (three) times daily.     [provider]  Infant Care Products (JOHNSONS Cline SHAMPOO) SHAM Apply 1 application topically daily. Mix 1/2 water and 1/2 shampoo, Scrub and apply on eyebrows and eyelid everyday.    [provider]  Lacosamide (VIMPAT) 150 MG TABS Take 1 tablet (150 mg total) by mouth 2 (two) times daily. Patient taking differently: Take 100 mg by mouth 2 (two) times daily.  03/08/18    [name], NP  lactulose (CHRONULAC) 10 GM/15ML solution Take 10 g by mouth daily as needed for mild constipation.     [provider]  levETIRAcetam (KEPPRA) 250 MG tablet Take 2 tablets (500 mg total) by mouth 2 (two) times daily. 04/06/18   Sheilah Pigeon, PA-C  levothyroxine (SYNTHROID, LEVOTHROID) 100 MCG tablet Take 100 mcg by mouth daily before breakfast.    [provider]  loperamide (IMODIUM) 2 MG capsule Take by mouth as needed for diarrhea or loose stools.    [provider]  Omega-3 Fatty Acids (FISH OIL) 500 MG CAPS Take by mouth 3 (three) times daily.    [provider]  promethazine (PHENERGAN) 25 MG tablet Take 25 mg by mouth every 6 (six) hours as needed for nausea or vomiting.    [provider]  pyridOXINE (VITAMIN B-6) 100 MG tablet Take 100 mg by mouth daily.    [provider]  SUNSCREEN SPF30 EX Apply 1 application topically daily as needed (prevent sunburn).    [provider]  terbinafine (LAMISIL) 1 % cream Apply 1 application topically daily.     [provider]    Allergies    Clonidine derivatives  Review of Systems   Review of Systems  Unable to perform ROS: Patient nonverbal   Physical Exam Updated Vital Signs BP 120/90 (BP Location: Right Arm)   Pulse 90   Temp 97.8 F (36.6 C) (Oral)   Resp 16   Ht 5\' 3"  (1.6 m)   Wt 74.4 kg   SpO2 100%   BMI 29.06 kg/m   Physical Exam Vitals and nursing note reviewed.  Constitutional:      General: He is not in acute distress.    Appearance: Normal appearance. He is not ill-appearing, toxic-appearing or diaphoretic.  HENT:     Head: Normocephalic.     Comments: 1cm laceration to chin Eyes:     Conjunctiva/sclera: Conjunctivae normal.  Cardiovascular:     Rate and Rhythm: Normal rate and regular rhythm.     Pulses: Normal pulses.  Pulmonary:     Effort: Pulmonary effort is normal. No respiratory distress.  Musculoskeletal:         General: No deformity or signs of injury.     Cervical back: No rigidity.  Skin:    General: Skin is warm and dry.     Coloration: Skin is not jaundiced or pale.     Comments: 1cm chin laceration  Neurological:     General: No focal deficit present.     Mental Status: He is alert.     Comments: Nystagmus, chronic    ED Results / Procedures / Treatments   Labs (all labs ordered are listed, but only abnormal results are displayed) Labs Reviewed - No data to display  EKG  None  Radiology CT Head Wo Contrast  Result Date: 12/11/2020 CLINICAL DATA:  Fall.  Bruise lip with laceration on chin. EXAM: CT HEAD WITHOUT CONTRAST CT MAXILLOFACIAL WITHOUT CONTRAST CT CERVICAL SPINE WITHOUT CONTRAST TECHNIQUE: Multidetector CT imaging of the head, cervical spine, and maxillofacial structures were performed using the standard protocol without intravenous contrast. Multiplanar CT image reconstructions of the cervical spine and maxillofacial structures were also generated. COMPARISON:  Maxillofacial CT 03/26/2018 head CT 11/09/2016 FINDINGS: CT HEAD FINDINGS Brain: Limited study due to extensive patient motion through the upper cerebrum, despite repeat scanning. Within this limitation, there is no gross abnormality identified although subtle areas of ischemia or small/subtle areas of acute hemorrhage could be obscured by the motion artifact. Diffuse loss of parenchymal volume is consistent with atrophy with prominence of the lateral ventricle similar to prior. Vascular: No hyperdense vessel or unexpected calcification. Skull: Study limited by motion artifact. No depressed skull fracture evident. Other: None. CT MAXILLOFACIAL FINDINGS Osseous: Nondiagnostic due to substantial motion artifact. Orbits: Nondiagnostic. Sinuses: No evidence for air-fluid level in the paranasal sinuses. No fluid in the mastoid air cells. Soft tissues: Obscured by motion. CT CERVICAL SPINE FINDINGS Alignment: Straightening of  normal cervical lordosis. No gross subluxation. Skull base and vertebrae: Markedly motion degraded. No gross or markedly displaced fracture. Subtle or nondisplaced fractures of the upper and lower cervical spine could be obscured. Soft tissues and spinal canal: No prevertebral fluid or swelling. No visible canal hematoma. Disc levels: Marked loss of disc height noted at C3-4 with endplate degeneration. Relatively diffuse loss of intervertebral disc height noted throughout the cervical spine. Upper chest: Unremarkable. Other: None. IMPRESSION: 1. Head CT markedly limited by patient motion despite repeat scanning. While no gross abnormality is evident, areas of subtle ischemia or small/subtle areas of acute hemorrhage could be obscured. No evidence for gross skull fracture. 2. Bony anatomy of the face is obscured by motion artifact. Maxillofacial CT is nondiagnostic. If there is clinical concern for maxillofacial fracture, repeat scanning when the patient is better able to participate with positioning is recommended. 3. Markedly motion degraded CT cervical spine. No gross subluxation on sagittal reconstructions. No fracture can be identified although imaging in the upper and lower portions of the cervical spine is nondiagnostic for subtle or nondisplaced fractures. If there is clinical concern for cervical spine fracture, consider repeat CT cervical spine when the patient is better able to participate with positioning. 4. Diffuse degenerative disc disease in the cervical spine, most advanced at C3-4. 5. Loss of cervical lordosis. This can be related to patient positioning, muscle spasm or soft tissue injury. Electronically Signed   By: Kennith Center M.D.   On: 12/11/2020 21:22   CT Cervical Spine Wo Contrast  Result Date: 12/11/2020 CLINICAL DATA:  Fall.  Bruise lip with laceration on chin. EXAM: CT HEAD WITHOUT CONTRAST CT MAXILLOFACIAL WITHOUT CONTRAST CT CERVICAL SPINE WITHOUT CONTRAST TECHNIQUE: Multidetector  CT imaging of the head, cervical spine, and maxillofacial structures were performed using the standard protocol without intravenous contrast. Multiplanar CT image reconstructions of the cervical spine and maxillofacial structures were also generated. COMPARISON:  Maxillofacial CT 03/26/2018 head CT 11/09/2016 FINDINGS: CT HEAD FINDINGS Brain: Limited study due to extensive patient motion through the upper cerebrum, despite repeat scanning. Within this limitation, there is no gross abnormality identified although subtle areas of ischemia or small/subtle areas of acute hemorrhage could be obscured by the motion artifact. Diffuse loss of parenchymal volume is consistent with atrophy with prominence of  the lateral ventricle similar to prior. Vascular: No hyperdense vessel or unexpected calcification. Skull: Study limited by motion artifact. No depressed skull fracture evident. Other: None. CT MAXILLOFACIAL FINDINGS Osseous: Nondiagnostic due to substantial motion artifact. Orbits: Nondiagnostic. Sinuses: No evidence for air-fluid level in the paranasal sinuses. No fluid in the mastoid air cells. Soft tissues: Obscured by motion. CT CERVICAL SPINE FINDINGS Alignment: Straightening of normal cervical lordosis. No gross subluxation. Skull base and vertebrae: Markedly motion degraded. No gross or markedly displaced fracture. Subtle or nondisplaced fractures of the upper and lower cervical spine could be obscured. Soft tissues and spinal canal: No prevertebral fluid or swelling. No visible canal hematoma. Disc levels: Marked loss of disc height noted at C3-4 with endplate degeneration. Relatively diffuse loss of intervertebral disc height noted throughout the cervical spine. Upper chest: Unremarkable. Other: None. IMPRESSION: 1. Head CT markedly limited by patient motion despite repeat scanning. While no gross abnormality is evident, areas of subtle ischemia or small/subtle areas of acute hemorrhage could be obscured. No  evidence for gross skull fracture. 2. Bony anatomy of the face is obscured by motion artifact. Maxillofacial CT is nondiagnostic. If there is clinical concern for maxillofacial fracture, repeat scanning when the patient is better able to participate with positioning is recommended. 3. Markedly motion degraded CT cervical spine. No gross subluxation on sagittal reconstructions. No fracture can be identified although imaging in the upper and lower portions of the cervical spine is nondiagnostic for subtle or nondisplaced fractures. If there is clinical concern for cervical spine fracture, consider repeat CT cervical spine when the patient is better able to participate with positioning. 4. Diffuse degenerative disc disease in the cervical spine, most advanced at C3-4. 5. Loss of cervical lordosis. This can be related to patient positioning, muscle spasm or soft tissue injury. Electronically Signed   By: Kennith CenterEric  Mansell M.D.   On: 12/11/2020 21:22   CT MAXILLOFACIAL WO CONTRAST  Result Date: 12/11/2020 CLINICAL DATA:  Fall.  Bruise lip with laceration on chin. EXAM: CT HEAD WITHOUT CONTRAST CT MAXILLOFACIAL WITHOUT CONTRAST CT CERVICAL SPINE WITHOUT CONTRAST TECHNIQUE: Multidetector CT imaging of the head, cervical spine, and maxillofacial structures were performed using the standard protocol without intravenous contrast. Multiplanar CT image reconstructions of the cervical spine and maxillofacial structures were also generated. COMPARISON:  Maxillofacial CT 03/26/2018 head CT 11/09/2016 FINDINGS: CT HEAD FINDINGS Brain: Limited study due to extensive patient motion through the upper cerebrum, despite repeat scanning. Within this limitation, there is no gross abnormality identified although subtle areas of ischemia or small/subtle areas of acute hemorrhage could be obscured by the motion artifact. Diffuse loss of parenchymal volume is consistent with atrophy with prominence of the lateral ventricle similar to prior.  Vascular: No hyperdense vessel or unexpected calcification. Skull: Study limited by motion artifact. No depressed skull fracture evident. Other: None. CT MAXILLOFACIAL FINDINGS Osseous: Nondiagnostic due to substantial motion artifact. Orbits: Nondiagnostic. Sinuses: No evidence for air-fluid level in the paranasal sinuses. No fluid in the mastoid air cells. Soft tissues: Obscured by motion. CT CERVICAL SPINE FINDINGS Alignment: Straightening of normal cervical lordosis. No gross subluxation. Skull base and vertebrae: Markedly motion degraded. No gross or markedly displaced fracture. Subtle or nondisplaced fractures of the upper and lower cervical spine could be obscured. Soft tissues and spinal canal: No prevertebral fluid or swelling. No visible canal hematoma. Disc levels: Marked loss of disc height noted at C3-4 with endplate degeneration. Relatively diffuse loss of intervertebral disc height noted throughout the cervical  spine. Upper chest: Unremarkable. Other: None. IMPRESSION: 1. Head CT markedly limited by patient motion despite repeat scanning. While no gross abnormality is evident, areas of subtle ischemia or small/subtle areas of acute hemorrhage could be obscured. No evidence for gross skull fracture. 2. Bony anatomy of the face is obscured by motion artifact. Maxillofacial CT is nondiagnostic. If there is clinical concern for maxillofacial fracture, repeat scanning when the patient is better able to participate with positioning is recommended. 3. Markedly motion degraded CT cervical spine. No gross subluxation on sagittal reconstructions. No fracture can be identified although imaging in the upper and lower portions of the cervical spine is nondiagnostic for subtle or nondisplaced fractures. If there is clinical concern for cervical spine fracture, consider repeat CT cervical spine when the patient is better able to participate with positioning. 4. Diffuse degenerative disc disease in the cervical  spine, most advanced at C3-4. 5. Loss of cervical lordosis. This can be related to patient positioning, muscle spasm or soft tissue injury. Electronically Signed   By: Kennith Center M.D.   On: 12/11/2020 21:22    Procedures .Marland KitchenLaceration Repair  Date/Time: 12/12/2020 1:35 AM Performed by: Alvira Monday, MD Authorized by: Alvira Monday, MD   Consent:    Consent obtained:  Verbal   Consent given by:  Guardian Anesthesia:    Anesthesia method:  Topical application   Topical anesthetic:  LET Laceration details:    Location:  Face   Face location:  Chin   Length (cm):  1   Depth (mm):  0.5 Pre-procedure details:    Preparation:  Patient was prepped and draped in usual sterile fashion Treatment:    Area cleansed with:  Saline and chlorhexidine   Amount of cleaning:  Standard   Irrigation solution:  Sterile saline   Irrigation method:  Syringe   Visualized foreign bodies/material removed: no     Debridement:  None   Undermining:  None Skin repair:    Repair method:  Sutures   Suture size:  5-0   Wound skin closure material used: vicryl rapide.   Number of sutures:  2 Repair type:    Repair type:  Simple Post-procedure details:    Procedure completion:  Tolerated   Medications Ordered in ED Medications  Tdap (BOOSTRIX) injection 0.5 mL (0.5 mLs Intramuscular Given 12/11/20 1744)  lidocaine-EPINEPHrine-tetracaine (LET) topical gel (3 mLs Topical Given 12/11/20 1742)  haloperidol lactate (HALDOL) injection 4 mg (4 mg Intramuscular Given 12/11/20 1805)  LORazepam (ATIVAN) injection 2 mg (2 mg Intramuscular Given 12/11/20 1915)  lidocaine-EPINEPHrine-tetracaine (LET) topical gel (3 mLs Topical Given 12/11/20 1939)    ED Course  I have reviewed the triage vital signs and the nursing notes.  Pertinent labs & imaging results that were available during my care of the patient were reviewed by me and considered in my medical decision making (see chart for details).    MDM  Rules/Calculators/A&P                           55yo male with history of Down's Syndrome, hypertension, hypothyroidism, seizures, OSA, nystagmus, resides in group home presents with concern for fall.  Given history of Down's, nonverbal, with head trauma, ordered CT head, face and cervical spine.  Patient would not lay down for CT imaging.  He was given Haldol, however still was not able to lay down.  He was given additional Ativan.  Discussed situation with his family.  His father  came to bedside, and was able to discuss obtaining a CT with patient and help him lay down for imaging.  They obtained CT imaging, however the imaging is very limited.  He had a markedly motion degraded CT, however there is no evidence of large fracture or intracranial bleed, and discussed that it is nondiagnostic for subtle or nondisplaced fractures.  Cohan does not appear uncomfortable on my exam, and discussed possibility of repeat imaging with father, however we agree that failure of his not appearing uncomfortable, and the potential need for significant sedation in order to obtain imaging and risks associated, feel it is reasonable to forego further evaluation.   He was given Tdap booster.  Laceration was irrigated and closed using vircyl rapide with 2 sutures. Patient discharged in stable condition with understanding of reasons to return.    Final Clinical Impression(s) / ED Diagnoses Final diagnoses:  Fall, initial encounter  Chin laceration, initial encounter    Rx / DC Orders ED Discharge Orders     None        Alvira Monday, MD 12/12/20 [phone]

## 2020-12-11 NOTE — ED Notes (Addendum)
Patient transported to CT. Patients dad at bedside. Patient appearing to be more calm, still and now able to lie head back.

## 2020-12-11 NOTE — ED Notes (Signed)
[phone], Zannie Kehr, patients nurse, called and received update.

## 2020-12-18 ENCOUNTER — Encounter (HOSPITAL_BASED_OUTPATIENT_CLINIC_OR_DEPARTMENT_OTHER): Payer: Self-pay

## 2020-12-18 ENCOUNTER — Other Ambulatory Visit: Payer: Self-pay

## 2020-12-18 ENCOUNTER — Emergency Department (HOSPITAL_BASED_OUTPATIENT_CLINIC_OR_DEPARTMENT_OTHER)
Admission: EM | Admit: 2020-12-18 | Discharge: 2020-12-18 | Disposition: A | Discharge status: Home / Self Care | Payer: Medicare Other | Attending: Emergency Medicine | Admitting: Emergency Medicine

## 2020-12-18 DIAGNOSIS — Z79899 Other long term (current) drug therapy: Secondary | ICD-10-CM | POA: Diagnosis not present

## 2020-12-18 DIAGNOSIS — K08119 Complete loss of teeth due to trauma, unspecified class: Secondary | ICD-10-CM | POA: Insufficient documentation

## 2020-12-18 DIAGNOSIS — I10 Essential (primary) hypertension: Secondary | ICD-10-CM | POA: Insufficient documentation

## 2020-12-18 DIAGNOSIS — S0083XA Contusion of other part of head, initial encounter: Secondary | ICD-10-CM

## 2020-12-18 DIAGNOSIS — S01511A Laceration without foreign body of lip, initial encounter: Secondary | ICD-10-CM | POA: Diagnosis not present

## 2020-12-18 DIAGNOSIS — E039 Hypothyroidism, unspecified: Secondary | ICD-10-CM | POA: Diagnosis not present

## 2020-12-18 DIAGNOSIS — W01198A Fall on same level from slipping, tripping and stumbling with subsequent striking against other object, initial encounter: Secondary | ICD-10-CM | POA: Diagnosis not present

## 2020-12-18 DIAGNOSIS — S0993XA Unspecified injury of face, initial encounter: Secondary | ICD-10-CM

## 2020-12-18 DIAGNOSIS — Y9210 Unspecified residential institution as the place of occurrence of the external cause: Secondary | ICD-10-CM | POA: Insufficient documentation

## 2020-12-18 DIAGNOSIS — W010XXA Fall on same level from slipping, tripping and stumbling without subsequent striking against object, initial encounter: Secondary | ICD-10-CM

## 2020-12-18 MED ORDER — AMOXICILLIN 250 MG/5ML PO SUSR
500.0000 mg | Freq: Once | ORAL | Status: AC
  Administered 2020-12-18: 500 mg via ORAL
  Filled 2020-12-18: qty 10

## 2020-12-18 MED ORDER — LIDOCAINE HCL (PF) 1 % IJ SOLN
5.0000 mL | Freq: Once | INTRAMUSCULAR | Status: AC
  Administered 2020-12-18: 5 mL
  Filled 2020-12-18: qty 5

## 2020-12-18 MED ORDER — ACETAMINOPHEN 160 MG/5ML PO SOLN
650.0000 mg | Freq: Once | ORAL | Status: AC
  Administered 2020-12-18: 650 mg via ORAL
  Filled 2020-12-18: qty 20.3

## 2020-12-18 MED ORDER — AMOXICILLIN 500 MG PO CAPS: 500.0000 mg | ORAL_CAPSULE | Freq: Three times a day (TID) | ORAL | 0 refills | Status: DC

## 2020-12-18 NOTE — ED Provider Notes (Signed)
[facility] DE[facility] Provider Note   CSN: [id number] Arrival date & time: 12/18/20  1543     History Chief Complaint  Patient presents with   Fall    [name] is a 55 y.o. male.  Patient s/p trip and fall. Hx Down's syndrome. Pt was getting up, foot got caught on furniture, and fell forward, hit face. No loc. Patient has been acting normally since fall. Tooth 9 fell out and is missing/absent. Lower lip laceration. Tetanus is up to date. No nausea/vomiting post fall. No other apparent injury noted. Pts alertness, mental status, and functional ability reported as being at baseline since fall. No anticoagulant use. Pt is limited historian - level 5 caveat. Discussed w caregiver and nurse - no seizure activity noted prior to, during or since fall.   The history is provided by the patient, a caregiver and medical records. The history is limited by the condition of the patient.  Fall      Past Medical History:  Diagnosis Date   Down's syndrome    GERD (gastroesophageal reflux disease)    Hypertension    Hypothyroid    Lives in group home    [phone]-Roxanne-nurse   Nystagmus    Obstructive sleep apnea (adult) (pediatric)    does use a cpap at night   Seizure disorder ([place]) 10/28/2016   Seizures ([place])     Patient Active Problem List   Diagnosis Date Noted   Bradycardia, drug induced 02/24/2018   Syncope 02/23/2018   Seizure disorder ([place]) 10/28/2016   Obstructive sleep apnea 07/05/2008   Down syndrome 07/05/2008    Past Surgical History:  Procedure Laterality Date   IRRIGATION AND DEBRIDEMENT  07/01/2014       ORIF HUMERUS FRACTURE Left 02/05/2013   Procedure: OPEN REDUCTION INTERNAL FIXATION (ORIF) MEDIAL HUMERUS CONDYLE FRACTURE;  Surgeon: Jolyn Nap, MD;  Location: [facility];  Service: Orthopedics;  Laterality: Left;   Right Elbow surgery         Family History  Problem Relation  Age of Onset   Dementia Maternal Grandmother    Diabetes Paternal Grandfather    Heart failure Paternal Grandfather    Hypertension Paternal Grandfather     Social History   Tobacco Use   Smoking status: Never   Smokeless tobacco: Never  Vaping Use   Vaping Use: Never used  Substance Use Topics   Alcohol use: No   Drug use: No    Home Medications Prior to Admission medications   Medication Sig Start Date End Date Taking? Authorizing Provider  acetaminophen (TYLENOL) 325 MG tablet Take 650 mg by mouth every 6 (six) hours as needed for mild pain.     [provider]  alum & mag hydroxide-simeth (MAALOX/MYLANTA) 200-200-20 MG/5ML suspension Take 15 mLs by mouth every 6 (six) hours as needed for indigestion or heartburn.    [provider]  Artificial Tear Ointment (LACRI-LUBE OP) Apply to eye.    [provider]  benazepril (LOTENSIN) 10 MG tablet Take 10 mg by mouth every evening.     [provider]  bisacodyl (DULCOLAX) 10 MG suppository Place 10 mg rectally as needed for moderate constipation.    [provider]  calcitonin, salmon, (MIACALCIN/FORTICAL) 200 UNIT/ACT nasal spray Place 1 spray into alternate nostrils daily.    [provider]  cholecalciferol (VITAMIN D) 1000 units tablet Take 2,000 Units by mouth daily.    [provider]  diphenhydrAMINE (BENADRYL)  25 mg capsule Take 25 mg by mouth every 4 (four) hours as needed for itching or allergies.    [provider]  doxycycline (VIBRA-TABS) 100 MG tablet  06/18/18   [provider]  Hypromellose (ARTIFICIAL TEARS OP) Apply 1 drop to eye 3 (three) times daily.     [provider]  Infant Care Products (JOHNSONS BABY SHAMPOO) SHAM Apply 1 application topically daily. Mix 1/2 water and 1/2 shampoo, Scrub and apply on eyebrows and eyelid everyday.    [provider]  Lacosamide (VIMPAT) 150 MG TABS Take 1 tablet (150 mg total) by  mouth 2 (two) times daily. Patient taking differently: Take 100 mg by mouth 2 (two) times daily.  03/08/18   Ward Givens, NP  lactulose (CHRONULAC) 10 GM/15ML solution Take 10 g by mouth daily as needed for mild constipation.     [provider]  levETIRAcetam (KEPPRA) 250 MG tablet Take 2 tablets (500 mg total) by mouth 2 (two) times daily. 04/06/18   Baldwin Jamaica, PA-C  levothyroxine (SYNTHROID, LEVOTHROID) 100 MCG tablet Take 100 mcg by mouth daily before breakfast.    [provider]  loperamide (IMODIUM) 2 MG capsule Take by mouth as needed for diarrhea or loose stools.    [provider]  Omega-3 Fatty Acids (FISH OIL) 500 MG CAPS Take by mouth 3 (three) times daily.    [provider]  promethazine (PHENERGAN) 25 MG tablet Take 25 mg by mouth every 6 (six) hours as needed for nausea or vomiting.    [provider]  pyridOXINE (VITAMIN B-6) 100 MG tablet Take 100 mg by mouth daily.    [provider]  SUNSCREEN DGU44 EX Apply 1 application topically daily as needed (prevent sunburn).    [provider]  terbinafine (LAMISIL) 1 % cream Apply 1 application topically daily.     [provider]    Allergies    Clonidine derivatives  Review of Systems   Review of Systems  Unable to perform ROS: Patient nonverbal  Constitutional:  Negative for fever.  Gastrointestinal:  Negative for vomiting.  Level 5 caveat, pt does not respond verbally to ros questions  Physical Exam Updated Vital Signs BP (!) 162/95 (BP Location: Right Arm)   Pulse 76   Temp 98 F (36.7 C)   Resp 16   SpO2 100%   Physical Exam Vitals and nursing note reviewed.  Constitutional:      Appearance: Normal appearance. He is well-developed.  HENT:     Head:     Comments: Contusion/laceration to lower lip. No fb seen or felt in wound. Tooth 9 missing/absent. No active bleeding at site. Other teeth firmly intact. No malocclusion.     Nose:  Nose normal.     Mouth/Throat:     Mouth: Mucous membranes are moist.     Pharynx: Oropharynx is clear.     Comments: Through and through lower lip lac - on mucosal surface, 1.5 cm, on out aspect lip 5 mm Eyes:     General: No scleral icterus.    Conjunctiva/sclera: Conjunctivae normal.     Pupils: Pupils are equal, round, and reactive to light.  Neck:     Trachea: No tracheal deviation.     Comments: Pt appears to move neck freely/comfortably in all direction. Spine aligned, non tender.  Cardiovascular:     Rate and Rhythm: Normal rate and regular rhythm.     Pulses: Normal pulses.  Heart sounds: Normal heart sounds. No murmur heard.   No friction rub. No gallop.  Pulmonary:     Effort: Pulmonary effort is normal. No accessory muscle usage or respiratory distress.     Breath sounds: Normal breath sounds.  Chest:     Chest wall: No tenderness.  Abdominal:     General: There is no distension.     Tenderness: There is no abdominal tenderness.  Genitourinary:    Comments: No cva tenderness. Musculoskeletal:        General: No swelling.     Cervical back: Normal range of motion and neck supple. No rigidity.     Comments: CTLS spine, non tender, aligned, no step off. No focal bony tenderness on extremity exam.   Skin:    General: Skin is warm and dry.     Findings: No rash.  Neurological:     Mental Status: He is alert.     Comments: Alert. Mental status and functional ability reported as c/w baseline. Able to ambulate.   Psychiatric:        Mood and Affect: Mood normal.    ED Results / Procedures / Treatments   Labs (all labs ordered are listed, but only abnormal results are displayed) Labs Reviewed - No data to display  EKG None  Radiology No results found.  Procedures .Marland KitchenLaceration Repair  Date/Time: 12/18/2020 6:31 PM Performed by: Lajean Saver, MD Authorized by: Lajean Saver, MD   Consent:    Consent given by:  Healthcare agent Laceration details:     Location:  Lip   Lip location:  Lower lip, full thickness   Vermilion border involved: no     Length (cm):  1.5 Pre-procedure details:    Preparation:  Patient was prepped and draped in usual sterile fashion Exploration:    Wound exploration: entire depth of wound visualized     Wound extent: no foreign bodies/material noted     Contaminated: no   Treatment:    Area cleansed with:  Saline   Irrigation solution:  Sterile saline   Irrigation method:  Syringe   Visualized foreign bodies/material removed: no (lip examined x 2, no foreign bodies visualized or felt, or detected w probing wound x 2)   Skin repair:    Repair method:  Sutures   Suture size:  5-0   Suture material:  Chromic gut (on mucosal surface, 1.5 cm wound, 3 5-0 chromic sutres, on outer surface, 5 mm lac, 1 5-0 rapid vicryl suture)   Suture technique:  Simple interrupted   Number of sutures:  4 Repair type:    Repair type:  Intermediate Comments:     Visualized wound as well possible, examined twice, as thoroughly as pt would allow/permit (considered imaging to definitively r/o fb, but noted on recent ct imaging, ct maf not diagnostic due to pt movement/inability to remain still. Examined again, and no fb seen or felt w examination and probing of wound.    Medications Ordered in ED Medications - No data to display  ED Course  I have reviewed the triage vital signs and the nursing notes.  Pertinent labs & imaging results that were available during my care of the patient were reviewed by me and considered in my medical decision making (see chart for details).    MDM Rules/Calculators/A&P                          Wound cleaned. No fb seen or  felt.   Reviewed nursing notes and prior charts for additional history.  Recent imaging post other fall reviewed - neg acute then.  With current fall. No loc, no change in mental status or function post fall, no nv, no anticoag use or other red flags.  As through and through  lip lac - will tx w course amox.   Pt observed in ED for ~ 3 hrs - mental status remains c/w baseline, no new symptoms.   Pt currently appear stable for d/c.   Fall precautions.  Return precautions provided.    Final Clinical Impression(s) / ED Diagnoses Final diagnoses:  None    Rx / DC Orders ED Discharge Orders     None        Lajean Saver, MD 12/18/20 Bosie Helper

## 2020-12-18 NOTE — ED Notes (Signed)
Wound care to bottom lip and to abrasion on chin.

## 2020-12-18 NOTE — Discharge Instructions (Addendum)
It was our pleasure to provide your ER care today - we hope that you feel better.  Keep wound very clean.   May give acetaminophen as need.   Give amoxicillin as prescribed.   Fall precautions.  Return to ER if worse, new symptoms, change in mental status, new or severe pain, persistent vomiting, infection of wound (spreading redness, pus, increased swelling), or other concern.

## 2020-12-18 NOTE — ED Triage Notes (Addendum)
Pt arrives with caregiver from group home. Caregiver states pt fell.  Pt bottom lip injured and bleeding.  Per caregiver, pt lost front tooth.  Caregiver denies any seizure or syncopal episode that caused the fall.

## 2020-12-18 NOTE — ED Notes (Signed)
ED Provider at bedside. 

## 2021-01-08 ENCOUNTER — Other Ambulatory Visit: Payer: Self-pay

## 2021-01-08 ENCOUNTER — Encounter: Payer: Self-pay | Admitting: Podiatry

## 2021-01-08 ENCOUNTER — Ambulatory Visit (INDEPENDENT_AMBULATORY_CARE_PROVIDER_SITE_OTHER): Payer: Medicare Other | Admitting: Podiatry

## 2021-01-08 DIAGNOSIS — M79675 Pain in left toe(s): Secondary | ICD-10-CM

## 2021-01-08 DIAGNOSIS — B351 Tinea unguium: Secondary | ICD-10-CM

## 2021-01-08 DIAGNOSIS — M79674 Pain in right toe(s): Secondary | ICD-10-CM

## 2021-01-08 NOTE — Progress Notes (Signed)
Subjective:   Patient ID: [name], male   DOB: 55 y.o.   MRN: [id number]   HPI Patient presents with caregiver with significant thickness yellow brittle nailbeds 1-5 both feet that they cannot take care of and they can become discomforting with shoe gear.  Patient's caregiver gives him full-time care and is not able to handle these and he is not able to take care of himself.  Patient does not smoke does not have good mental acuity   ROS      Objective:  Physical Exam Vitals and nursing note reviewed.  Constitutional:      Appearance: He is well-developed.  Pulmonary:     Effort: Pulmonary effort is normal.  Musculoskeletal:        General: Normal range of motion.  Skin:    General: Skin is warm.  Neurological:     Mental Status: He is alert.    Neurovascular status intact muscle strength is adequate range of motion adequate with patient found to have significant thickness discoloration and deterioration in nailbeds 1-5 both feet that can get incurvated in the corners and are impossible for them to cut     Assessment:  Chronic mycotic nail infection 1-5 both feet and patient with Down syndrome     Plan:  H&P condition reviewed with caregiver and at this point nail debridement 1-5 both feet no iatrogenic bleeding accomplished and advised on continue treatment as needed.  Patient will be seen back to recheck as needed

## 2021-02-03 NOTE — Progress Notes (Signed)
PCP:  Lucretia Field Primary Cardiologist: None Electrophysiologist: Will Jorja Loa, MD   [name] is a 55 y.o. male seen today for Will Jorja Loa, MD for routine electrophysiology followup.  Since last being seen in our clinic his caregiver reports he has been doing well. Denies overt syncope. He does have falls which seem to be related to rapid standing. He is at his mental baseline (pleasantly demented) prior to and immediately after these episodes. He does not answer questions. Caregiver has no specific complaints or concerns to report.   Past Medical History:  Diagnosis Date   Down's syndrome    GERD (gastroesophageal reflux disease)    Hypertension    Hypothyroid    Lives in group home    [phone]-[phone]-Roxanne-nurse   Nystagmus    Obstructive sleep apnea (adult) (pediatric)    does use a cpap at night   Seizure disorder ([facility]) 10/28/2016   Seizures ([facility])    Past Surgical History:  Procedure Laterality Date   IRRIGATION AND DEBRIDEMENT  07/01/2014       ORIF HUMERUS FRACTURE Left 02/05/2013   Procedure: OPEN REDUCTION INTERNAL FIXATION (ORIF) MEDIAL HUMERUS CONDYLE FRACTURE;  Surgeon: Jodi Marble, MD;  Location: [facility];  Service: Orthopedics;  Laterality: Left;   Right Elbow surgery      Current Outpatient Medications  Medication Sig Dispense Refill   acetaminophen (TYLENOL) 325 MG tablet Take 650 mg by mouth every 6 (six) hours as needed for mild pain.      alum & mag hydroxide-simeth (MAALOX/MYLANTA) 200-200-20 MG/5ML suspension Take 15 mLs by mouth every 6 (six) hours as needed for indigestion or heartburn.     amoxicillin (AMOXIL) 500 MG capsule Take 1 capsule (500 mg total) by mouth 3 (three) times daily. 15 capsule 0   Artificial Tear Ointment (LACRI-LUBE OP) Apply to eye.     benazepril (LOTENSIN) 10 MG tablet Take 10 mg by mouth every evening.      bisacodyl (DULCOLAX) 10 MG suppository Place 10  mg rectally as needed for moderate constipation.     calcitonin, salmon, (MIACALCIN/FORTICAL) 200 UNIT/ACT nasal spray Place 1 spray into alternate nostrils daily.     cholecalciferol (VITAMIN D) 1000 units tablet Take 2,000 Units by mouth daily.     diphenhydrAMINE (BENADRYL) 25 mg capsule Take 25 mg by mouth every 4 (four) hours as needed for itching or allergies.     doxycycline (VIBRA-TABS) 100 MG tablet      Hypromellose (ARTIFICIAL TEARS OP) Apply 1 drop to eye 3 (three) times daily.      Infant Care Products (JOHNSONS BABY SHAMPOO) SHAM Apply 1 application topically daily. Mix 1/2 water and 1/2 shampoo, Scrub and apply on eyebrows and eyelid everyday.     Lacosamide (VIMPAT) 150 MG TABS Take 1 tablet (150 mg total) by mouth 2 (two) times daily. (Patient taking differently: Take 100 mg by mouth 2 (two) times daily.) 60 tablet 5   lactulose (CHRONULAC) 10 GM/15ML solution Take 10 g by mouth daily as needed for mild constipation.      levETIRAcetam (KEPPRA) 250 MG tablet Take 2 tablets (500 mg total) by mouth 2 (two) times daily. 60 tablet 0   levothyroxine (SYNTHROID, LEVOTHROID) 100 MCG tablet Take 100 mcg by mouth daily before breakfast.     loperamide (IMODIUM) 2 MG capsule Take by mouth as needed for diarrhea or loose stools.     Omega-3 Fatty Acids (FISH OIL) 500 MG  CAPS Take by mouth 3 (three) times daily.     promethazine (PHENERGAN) 25 MG tablet Take 25 mg by mouth every 6 (six) hours as needed for nausea or vomiting.     pyridOXINE (VITAMIN B-6) 100 MG tablet Take 100 mg by mouth daily.     SUNSCREEN SPF30 EX Apply 1 application topically daily as needed (prevent sunburn).     terbinafine (LAMISIL) 1 % cream Apply 1 application topically daily.      No current facility-administered medications for this visit.    Allergies  Allergen Reactions   Clonidine Derivatives     Causes symptomatic bradycardia with syncope and hypotension    Social History   Socioeconomic History    Marital status: Single    Spouse name: Not on file   Number of children: 0   Years of education: 12   Highest education level: Not on file  Occupational History   Occupation: [employer]  Tobacco Use   Smoking status: Never   Smokeless tobacco: Never  Vaping Use   Vaping Use: Never used  Substance and Sexual Activity   Alcohol use: No   Drug use: No   Sexual activity: Not on file  Other Topics Concern   Not on file  Social History Narrative   Lives at a group home   Caffeine use: Tea/soda once per week   Right handed   Social Determinants of Health   Financial Resource Strain: Not on file  Food Insecurity: Not on file  Transportation Needs: Not on file  Physical Activity: Not on file  Stress: Not on file  Social Connections: Not on file  Intimate Partner Violence: Not on file     Review of Systems: General: No chills, fever, night sweats or weight changes  Cardiovascular:  No chest pain, dyspnea on exertion, edema, orthopnea, palpitations, paroxysmal nocturnal dyspnea Dermatological: No rash, lesions or masses Respiratory: No cough, dyspnea Urologic: No hematuria, dysuria Abdominal: No nausea, vomiting, diarrhea, bright red blood per rectum, melena, or hematemesis Neurologic: No visual changes, weakness, changes in mental status All other systems reviewed and are otherwise negative except as noted above.  Physical Exam: Vitals:   02/04/21 1043  BP: 126/73  Pulse: 78  Weight: 158 lb 9.6 oz (71.9 kg)  Height: 5\' 3"  (1.6 m)    GEN- The patient is well appearing, alert and oriented x 3 today.   HEENT: normocephalic, atraumatic; sclera clear, conjunctiva pink; hearing intact; oropharynx clear; neck supple, no JVP Lymph- no cervical lymphadenopathy Lungs- Clear to ausculation bilaterally, normal work of breathing.  No wheezes, rales, rhonchi Heart- Regular rate and rhythm, no murmurs, rubs or gallops, PMI not laterally displaced GI- soft, non-tender, non-distended,  bowel sounds present, no hepatosplenomegaly Extremities- no clubbing, cyanosis, or edema; DP/PT/radial pulses 2+ bilaterally MS- no significant deformity or atrophy Skin- warm and dry, no rash or lesion Psych- euthymic mood, full affect Neuro- strength and sensation are intact  EKG is ordered. Personal review of EKG from today shows NSR at 78 bpm with IVCD at 112 ms.   Additional studies reviewed include: Previous EP office notes.   Assessment and Plan:  1. Fall vs syncope/near syncope 2. Episodes of decreased LOC, lethargy     Suspect BP was major driving factor  He continues to have intermittent falls. He is at his mental baseline before and immediately after these events, and they are mostly associated with rapid standing. Recommended fall precautions.   He would be a poor candidate  for monitoring, even loop recorder. We will continue to follow annually, and conservatively.   Graciella Freer, PA-C  02/04/21 10:47 AM

## 2021-02-04 ENCOUNTER — Other Ambulatory Visit: Payer: Self-pay

## 2021-02-04 ENCOUNTER — Encounter (INDEPENDENT_AMBULATORY_CARE_PROVIDER_SITE_OTHER): Payer: Self-pay

## 2021-02-04 ENCOUNTER — Ambulatory Visit (INDEPENDENT_AMBULATORY_CARE_PROVIDER_SITE_OTHER): Payer: Medicare Other | Admitting: Student

## 2021-02-04 ENCOUNTER — Encounter: Payer: Self-pay | Admitting: Student

## 2021-02-04 VITALS — BP 126/73 | HR 78 | Ht 63.0 in | Wt 158.6 lb

## 2021-02-04 DIAGNOSIS — R55 Syncope and collapse: Secondary | ICD-10-CM

## 2021-02-04 DIAGNOSIS — R001 Bradycardia, unspecified: Secondary | ICD-10-CM | POA: Diagnosis not present

## 2021-02-04 DIAGNOSIS — T50905A Adverse effect of unspecified drugs, medicaments and biological substances, initial encounter: Secondary | ICD-10-CM | POA: Diagnosis not present

## 2021-02-04 NOTE — Patient Instructions (Signed)
Medication Instructions:  Your physician recommends that you continue on your current medications as directed. Please refer to the Current Medication list given to you today.  *If you need a refill on your cardiac medications before your next appointment, please call your pharmacy*   Lab Work: None  If you have labs (blood work) drawn today and your tests are completely normal, you will receive your results only by: MyChart Message (if you have MyChart) OR A paper copy in the mail If you have any lab test that is abnormal or we need to change your treatment, we will call you to review the results.   Follow-Up: At [facility], you and your health needs are our priority.  As part of our continuing mission to provide you with exceptional heart care, we have created designated Provider Care Teams.  These Care Teams include your primary Cardiologist (physician) and Advanced Practice Providers (APPs -  Physician Assistants and Nurse Practitioners) who all work together to provide you with the care you need, when you need it.  We recommend signing up for the patient portal called "MyChart".  Sign up information is provided on this After Visit Summary.  MyChart is used to connect with patients for Virtual Visits (Telemedicine).  Patients are able to view lab/test results, encounter notes, upcoming appointments, etc.  Non-urgent messages can be sent to your provider as well.   To learn more about what you can do with MyChart, go to [email or web address].    Your next appointment:   1 year(s)  The format for your next appointment:   In Person  Provider:   You may see Will [name] Camnitz, MD or one of the following Advanced Practice Providers on your designated Care Team:   Renee Ursuy, PA-C Michael "Andy" Tillery, PA-C    

## 2021-02-15 ENCOUNTER — Emergency Department (HOSPITAL_COMMUNITY): Payer: Medicare Other

## 2021-02-15 ENCOUNTER — Inpatient Hospital Stay (HOSPITAL_COMMUNITY)
Admission: EM | Admit: 2021-02-15 | Discharge: 2021-02-18 | DRG: 071 | Disposition: A | Discharge status: Home / Self Care | Payer: Medicare Other | Attending: Internal Medicine | Admitting: Internal Medicine

## 2021-02-15 ENCOUNTER — Other Ambulatory Visit: Payer: Self-pay

## 2021-02-15 ENCOUNTER — Encounter (HOSPITAL_COMMUNITY): Payer: Self-pay

## 2021-02-15 DIAGNOSIS — G4733 Obstructive sleep apnea (adult) (pediatric): Secondary | ICD-10-CM | POA: Diagnosis present

## 2021-02-15 DIAGNOSIS — A419 Sepsis, unspecified organism: Secondary | ICD-10-CM | POA: Diagnosis not present

## 2021-02-15 DIAGNOSIS — R680 Hypothermia, not associated with low environmental temperature: Secondary | ICD-10-CM | POA: Diagnosis present

## 2021-02-15 DIAGNOSIS — R001 Bradycardia, unspecified: Secondary | ICD-10-CM

## 2021-02-15 DIAGNOSIS — R339 Retention of urine, unspecified: Secondary | ICD-10-CM | POA: Diagnosis present

## 2021-02-15 DIAGNOSIS — Q909 Down syndrome, unspecified: Secondary | ICD-10-CM | POA: Diagnosis not present

## 2021-02-15 DIAGNOSIS — Z9181 History of falling: Secondary | ICD-10-CM | POA: Diagnosis not present

## 2021-02-15 DIAGNOSIS — Z79890 Hormone replacement therapy (postmenopausal): Secondary | ICD-10-CM | POA: Diagnosis not present

## 2021-02-15 DIAGNOSIS — G40909 Epilepsy, unspecified, not intractable, without status epilepticus: Secondary | ICD-10-CM | POA: Diagnosis present

## 2021-02-15 DIAGNOSIS — R55 Syncope and collapse: Secondary | ICD-10-CM | POA: Diagnosis not present

## 2021-02-15 DIAGNOSIS — Z79899 Other long term (current) drug therapy: Secondary | ICD-10-CM | POA: Diagnosis not present

## 2021-02-15 DIAGNOSIS — D539 Nutritional anemia, unspecified: Secondary | ICD-10-CM | POA: Diagnosis present

## 2021-02-15 DIAGNOSIS — Z833 Family history of diabetes mellitus: Secondary | ICD-10-CM

## 2021-02-15 DIAGNOSIS — E039 Hypothyroidism, unspecified: Secondary | ICD-10-CM | POA: Diagnosis present

## 2021-02-15 DIAGNOSIS — Z888 Allergy status to other drugs, medicaments and biological substances status: Secondary | ICD-10-CM

## 2021-02-15 DIAGNOSIS — S02612A Fracture of condylar process of left mandible, initial encounter for closed fracture: Secondary | ICD-10-CM | POA: Diagnosis present

## 2021-02-15 DIAGNOSIS — K219 Gastro-esophageal reflux disease without esophagitis: Secondary | ICD-10-CM | POA: Diagnosis present

## 2021-02-15 DIAGNOSIS — Z20822 Contact with and (suspected) exposure to covid-19: Secondary | ICD-10-CM | POA: Diagnosis present

## 2021-02-15 DIAGNOSIS — I959 Hypotension, unspecified: Secondary | ICD-10-CM | POA: Diagnosis present

## 2021-02-15 DIAGNOSIS — G9341 Metabolic encephalopathy: Secondary | ICD-10-CM | POA: Diagnosis present

## 2021-02-15 DIAGNOSIS — R296 Repeated falls: Secondary | ICD-10-CM | POA: Diagnosis present

## 2021-02-15 DIAGNOSIS — Z8249 Family history of ischemic heart disease and other diseases of the circulatory system: Secondary | ICD-10-CM

## 2021-02-15 DIAGNOSIS — J9811 Atelectasis: Secondary | ICD-10-CM | POA: Diagnosis present

## 2021-02-15 DIAGNOSIS — R6510 Systemic inflammatory response syndrome (SIRS) of non-infectious origin without acute organ dysfunction: Secondary | ICD-10-CM | POA: Diagnosis present

## 2021-02-15 DIAGNOSIS — R569 Unspecified convulsions: Secondary | ICD-10-CM | POA: Diagnosis not present

## 2021-02-15 DIAGNOSIS — I10 Essential (primary) hypertension: Secondary | ICD-10-CM | POA: Diagnosis present

## 2021-02-15 DIAGNOSIS — R4182 Altered mental status, unspecified: Secondary | ICD-10-CM | POA: Diagnosis present

## 2021-02-15 LAB — CBC
HCT: 32.7 % — ABNORMAL LOW (ref 39.0–52.0)
Hemoglobin: 10.9 g/dL — ABNORMAL LOW (ref 13.0–17.0)
MCH: 34.6 pg — ABNORMAL HIGH (ref 26.0–34.0)
MCHC: 33.3 g/dL (ref 30.0–36.0)
MCV: 103.8 fL — ABNORMAL HIGH (ref 80.0–100.0)
Platelets: 226 10*3/uL (ref 150–400)
RBC: 3.15 MIL/uL — ABNORMAL LOW (ref 4.22–5.81)
RDW: 13.6 % (ref 11.5–15.5)
WBC: 4.1 10*3/uL (ref 4.0–10.5)
nRBC: 0 % (ref 0.0–0.2)

## 2021-02-15 LAB — HEPATIC FUNCTION PANEL
ALT: 41 U/L (ref 0–44)
AST: 44 U/L — ABNORMAL HIGH (ref 15–41)
Albumin: 2.9 g/dL — ABNORMAL LOW (ref 3.5–5.0)
Alkaline Phosphatase: 167 U/L — ABNORMAL HIGH (ref 38–126)
Bilirubin, Direct: 0.2 mg/dL (ref 0.0–0.2)
Indirect Bilirubin: 0.3 mg/dL (ref 0.3–0.9)
Total Bilirubin: 0.5 mg/dL (ref 0.3–1.2)
Total Protein: 6 g/dL — ABNORMAL LOW (ref 6.5–8.1)

## 2021-02-15 LAB — URINALYSIS, ROUTINE W REFLEX MICROSCOPIC
Bilirubin Urine: NEGATIVE
Glucose, UA: NEGATIVE mg/dL
Hgb urine dipstick: NEGATIVE
Ketones, ur: NEGATIVE mg/dL
Leukocytes,Ua: NEGATIVE
Nitrite: NEGATIVE
Protein, ur: NEGATIVE mg/dL
Specific Gravity, Urine: 1.01 (ref 1.005–1.030)
pH: 6.5 (ref 5.0–8.0)

## 2021-02-15 LAB — CBG MONITORING, ED: Glucose-Capillary: 99 mg/dL (ref 70–99)

## 2021-02-15 LAB — FOLATE: Folate: 10.7 ng/mL (ref >5.9)

## 2021-02-15 LAB — CBC WITH DIFFERENTIAL/PLATELET
Abs Immature Granulocytes: 0.01 10*3/uL (ref 0.00–0.07)
Basophils Absolute: 0 10*3/uL (ref 0.0–0.1)
Basophils Relative: 1 %
Eosinophils Absolute: 0 10*3/uL (ref 0.0–0.5)
Eosinophils Relative: 1 %
HCT: 36.3 % — ABNORMAL LOW (ref 39.0–52.0)
Hemoglobin: 12 g/dL — ABNORMAL LOW (ref 13.0–17.0)
Immature Granulocytes: 0 %
Lymphocytes Relative: 44 %
Lymphs Abs: 2 10*3/uL (ref 0.7–4.0)
MCH: 34.3 pg — ABNORMAL HIGH (ref 26.0–34.0)
MCHC: 33.1 g/dL (ref 30.0–36.0)
MCV: 103.7 fL — ABNORMAL HIGH (ref 80.0–100.0)
Monocytes Absolute: 0.4 10*3/uL (ref 0.1–1.0)
Monocytes Relative: 9 %
Neutro Abs: 1.9 10*3/uL (ref 1.7–7.7)
Neutrophils Relative %: 45 %
Platelets: 252 10*3/uL (ref 150–400)
RBC: 3.5 MIL/uL — ABNORMAL LOW (ref 4.22–5.81)
RDW: 13.4 % (ref 11.5–15.5)
WBC: 4.3 10*3/uL (ref 4.0–10.5)
nRBC: 0 % (ref 0.0–0.2)

## 2021-02-15 LAB — RESP PANEL BY RT-PCR (FLU A&B, COVID) ARPGX2
Influenza A by PCR: NEGATIVE
Influenza B by PCR: NEGATIVE
SARS Coronavirus 2 by RT PCR: NEGATIVE

## 2021-02-15 LAB — BASIC METABOLIC PANEL
Anion gap: 6 (ref 5–15)
BUN: 15 mg/dL (ref 6–20)
CO2: 25 mmol/L (ref 22–32)
Calcium: 8.8 mg/dL — ABNORMAL LOW (ref 8.9–10.3)
Chloride: 108 mmol/L (ref 98–111)
Creatinine, Ser: 0.87 mg/dL (ref 0.61–1.24)
GFR, Estimated: >60 mL/min (ref >60)
Glucose, Bld: 108 mg/dL — ABNORMAL HIGH (ref 70–99)
Potassium: 4.1 mmol/L (ref 3.5–5.1)
Sodium: 139 mmol/L (ref 135–145)

## 2021-02-15 LAB — GAMMA GT: GGT: 28 U/L (ref 7–50)

## 2021-02-15 LAB — GLUCOSE, CAPILLARY: Glucose-Capillary: 95 mg/dL (ref 70–99)

## 2021-02-15 LAB — TROPONIN I (HIGH SENSITIVITY)
Troponin I (High Sensitivity): 5 ng/L (ref <18)
Troponin I (High Sensitivity): 4 ng/L (ref <18)

## 2021-02-15 LAB — LACTIC ACID, PLASMA
Lactic Acid, Venous: 1.6 mmol/L (ref 0.5–1.9)
Lactic Acid, Venous: 1.1 mmol/L (ref 0.5–1.9)

## 2021-02-15 LAB — APTT: aPTT: 26 seconds (ref 24–36)

## 2021-02-15 LAB — PROTIME-INR
INR: 1.2 (ref 0.8–1.2)
Prothrombin Time: 14.9 seconds (ref 11.4–15.2)

## 2021-02-15 LAB — CREATININE, SERUM
Creatinine, Ser: 0.6 mg/dL — ABNORMAL LOW (ref 0.61–1.24)
GFR, Estimated: >60 mL/min (ref >60)

## 2021-02-15 LAB — TSH: TSH: 0.757 u[IU]/mL (ref 0.350–4.500)

## 2021-02-15 LAB — MRSA NEXT GEN BY PCR, NASAL: MRSA by PCR Next Gen: NOT DETECTED

## 2021-02-15 MED ORDER — ONDANSETRON HCL 4 MG PO TABS: 4.0000 mg | ORAL_TABLET | Freq: Four times a day (QID) | ORAL | Status: DC | PRN

## 2021-02-15 MED ORDER — VANCOMYCIN HCL 1750 MG/350ML IV SOLN
1750.0000 mg | INTRAVENOUS | Status: DC
  Administered 2021-02-16: 1750 mg via INTRAVENOUS
  Filled 2021-02-15 (×2): qty 350

## 2021-02-15 MED ORDER — LACTATED RINGERS IV BOLUS
500.0000 mL | Freq: Once | INTRAVENOUS | Status: AC
  Administered 2021-02-15: 500 mL via INTRAVENOUS

## 2021-02-15 MED ORDER — VANCOMYCIN HCL 1250 MG/250ML IV SOLN
1250.0000 mg | Freq: Once | INTRAVENOUS | Status: AC
  Administered 2021-02-15: 1250 mg via INTRAVENOUS
  Filled 2021-02-15: qty 250

## 2021-02-15 MED ORDER — ENOXAPARIN SODIUM 40 MG/0.4ML IJ SOSY
40.0000 mg | PREFILLED_SYRINGE | INTRAMUSCULAR | Status: DC
  Administered 2021-02-15 – 2021-02-17 (×3): 40 mg via SUBCUTANEOUS
  Filled 2021-02-15 (×3): qty 0.4

## 2021-02-15 MED ORDER — ONDANSETRON HCL 4 MG/2ML IJ SOLN: 4.0000 mg | Freq: Four times a day (QID) | INTRAMUSCULAR | Status: DC | PRN

## 2021-02-15 MED ORDER — LACTATED RINGERS IV BOLUS (SEPSIS)
250.0000 mL | Freq: Once | INTRAVENOUS | Status: AC
Start: 2021-02-15 — End: 2021-02-15
  Administered 2021-02-15: 250 mL via INTRAVENOUS

## 2021-02-15 MED ORDER — LACTATED RINGERS IV SOLN: INTRAVENOUS | Status: AC

## 2021-02-15 MED ORDER — METRONIDAZOLE 500 MG/100ML IV SOLN
500.0000 mg | Freq: Once | INTRAVENOUS | Status: AC
  Administered 2021-02-15: 500 mg via INTRAVENOUS
  Filled 2021-02-15: qty 100

## 2021-02-15 MED ORDER — LACTATED RINGERS IV BOLUS (SEPSIS)
1000.0000 mL | Freq: Once | INTRAVENOUS | Status: AC
  Administered 2021-02-15: 1000 mL via INTRAVENOUS

## 2021-02-15 MED ORDER — VANCOMYCIN HCL IN DEXTROSE 1-5 GM/200ML-% IV SOLN: 1000.0000 mg | Freq: Once | INTRAVENOUS | Status: DC

## 2021-02-15 MED ORDER — LEVETIRACETAM IN NACL 500 MG/100ML IV SOLN
500.0000 mg | Freq: Two times a day (BID) | INTRAVENOUS | Status: DC
  Administered 2021-02-15 – 2021-02-18 (×6): 500 mg via INTRAVENOUS
  Filled 2021-02-15 (×7): qty 100

## 2021-02-15 MED ORDER — SODIUM CHLORIDE 0.9 % IV SOLN
2.0000 g | Freq: Once | INTRAVENOUS | Status: AC
  Administered 2021-02-15: 2 g via INTRAVENOUS
  Filled 2021-02-15: qty 2

## 2021-02-15 MED ORDER — CHLORHEXIDINE GLUCONATE CLOTH 2 % EX PADS
6.0000 | MEDICATED_PAD | Freq: Every day | CUTANEOUS | Status: DC
  Administered 2021-02-15 – 2021-02-18 (×4): 6 via TOPICAL

## 2021-02-15 MED ORDER — METRONIDAZOLE 500 MG/100ML IV SOLN
500.0000 mg | Freq: Two times a day (BID) | INTRAVENOUS | Status: DC
  Administered 2021-02-15 – 2021-02-16 (×3): 500 mg via INTRAVENOUS
  Filled 2021-02-15 (×3): qty 100

## 2021-02-15 MED ORDER — SODIUM CHLORIDE 0.9 % IV SOLN
2.0000 g | Freq: Three times a day (TID) | INTRAVENOUS | Status: DC
  Administered 2021-02-15 – 2021-02-17 (×4): 2 g via INTRAVENOUS
  Filled 2021-02-15 (×4): qty 2

## 2021-02-15 NOTE — H&P (Signed)
History and Physical    [name] XAJ:[id number] DOB: [birth year] DOA: 02/15/2021  PCP: Trinidad Curet  Patient coming from: Group home  Chief Complaint: Altered mental status  HPI: [name] is a 55 y.o. male with medical history significant of Down's syndrome, seizure d/o, hypothyroidism, HTN. Presenting w/ altered mental status. History is per chart review as patient is somnolent/confused and family at bedside is unaware of his history. Apparently the patient was in his normal state of health until this morning. During breakfast, he was found to be unresponsive. He was splashed with cold water and he reacted but was not his normal self. It was noted that he was incontinent of urine, but no tonic-clonic activity was noted. The facility became concerned, and sent him to the ED.   ED Course: He was found to be hypotensive and hypothermic when he arrived. He was weakly responsive to noxious stimuli. CTH was negative for acute lesion. An acute mildly displaced fracture of the left mandibular condyle was noted on CT c-spine. Plastics was consulted. He was started on broad spec abx. Neuro was consulted. TRH was called for admission.   Review of Systems:  Unable to obtain d/t mentation.  PMHx Past Medical History:  Diagnosis Date   Down's syndrome    GERD (gastroesophageal reflux disease)    Hypertension    Hypothyroid    Lives in group home    [phone]-fax-[phone]-[name]-nurse   Nystagmus    Obstructive sleep apnea (adult) (pediatric)    does use a cpap at night   Seizure disorder ([place]) 10/28/2016   Seizures ([place])     PSHx Past Surgical History:  Procedure Laterality Date   IRRIGATION AND DEBRIDEMENT  07/01/2014       ORIF HUMERUS FRACTURE Left 02/05/2013   Procedure: OPEN REDUCTION INTERNAL FIXATION (ORIF) MEDIAL HUMERUS CONDYLE FRACTURE;  Surgeon: Jolyn Nap, MD;  Location: [facility];  Service: Orthopedics;  Laterality:  Left;   Right Elbow surgery      SocHx  reports that he has never smoked. He has never used smokeless tobacco. He reports that he does not drink alcohol and does not use drugs.  Allergies  Allergen Reactions   Clonidine Derivatives     Causes symptomatic bradycardia with syncope and hypotension    FamHx Family History  Problem Relation Age of Onset   Dementia Maternal Grandmother    Diabetes Paternal Grandfather    Heart failure Paternal Grandfather    Hypertension Paternal Grandfather     Prior to Admission medications   Medication Sig Start Date End Date Taking? Authorizing Provider  acetaminophen (TYLENOL) 325 MG tablet Take 650 mg by mouth every 6 (six) hours as needed for mild pain.     [provider]  alum & mag hydroxide-simeth (MAALOX/MYLANTA) 200-200-20 MG/5ML suspension Take 15 mLs by mouth every 6 (six) hours as needed for indigestion or heartburn.    [provider]  amoxicillin (AMOXIL) 500 MG capsule Take 1 capsule (500 mg total) by mouth 3 (three) times daily. 12/18/20   Lajean Saver, MD  Artificial Tear Ointment (LACRI-LUBE OP) Apply to eye.    [provider]  benazepril (LOTENSIN) 10 MG tablet Take 10 mg by mouth every evening.     [provider]  bisacodyl (DULCOLAX) 10 MG suppository Place 10 mg rectally as needed for moderate constipation.    [provider]  calcitonin, salmon, (MIACALCIN/FORTICAL) 200 UNIT/ACT nasal spray Place 1 spray into alternate  nostrils daily.    [provider]  cholecalciferol (VITAMIN D) 1000 units tablet Take 2,000 Units by mouth daily.    [provider]  diphenhydrAMINE (BENADRYL) 25 mg capsule Take 25 mg by mouth every 4 (four) hours as needed for itching or allergies.    [provider]  doxycycline (VIBRA-TABS) 100 MG tablet  06/18/18   [provider]  Hypromellose (ARTIFICIAL TEARS OP) Apply 1 drop to eye 3 (three) times daily.     [provider]  Infant Care Products (JOHNSONS BABY SHAMPOO) SHAM Apply 1 application topically daily. Mix 1/2 water and 1/2 shampoo, Scrub and apply on eyebrows and eyelid everyday.    [provider]  Lacosamide (VIMPAT) 150 MG TABS Take 1 tablet (150 mg total) by mouth 2 (two) times daily. Patient taking differently: Take 100 mg by mouth 2 (two) times daily. 03/08/18   Ward Givens, NP  lactulose (CHRONULAC) 10 GM/15ML solution Take 10 g by mouth daily as needed for mild constipation.     [provider]  levETIRAcetam (KEPPRA) 250 MG tablet Take 2 tablets (500 mg total) by mouth 2 (two) times daily. 04/06/18   Baldwin Jamaica, PA-C  levothyroxine (SYNTHROID, LEVOTHROID) 100 MCG tablet Take 100 mcg by mouth daily before breakfast.    [provider]  loperamide (IMODIUM) 2 MG capsule Take by mouth as needed for diarrhea or loose stools.    [provider]  Omega-3 Fatty Acids (FISH OIL) 500 MG CAPS Take by mouth 3 (three) times daily.    [provider]  promethazine (PHENERGAN) 25 MG tablet Take 25 mg by mouth every 6 (six) hours as needed for nausea or vomiting.    [provider]  pyridOXINE (VITAMIN B-6) 100 MG tablet Take 100 mg by mouth daily.    [provider]  SUNSCREEN [id number] EX Apply 1 application topically daily as needed (prevent sunburn).    [provider]  terbinafine (LAMISIL) 1 % cream Apply 1 application topically daily.     [provider]    Physical Exam: Vitals:   02/15/21 1148 02/15/21 1218 02/15/21 1259 02/15/21 1333  BP: (!) 81/49 (!) 86/53 99/65 (!) 89/58  Pulse:  (!) 53 (!) 55 (!) 58  Resp: '13 16 16 16  ' Temp:   (!) 94.7 F (34.8 C)   TempSrc:   Rectal   SpO2:  100% 100% 100%    General: 55 y.o. male resting in bed in NAD Eyes: PERRL, normal sclera ENMT: Nares patent w/o discharge, orophaynx clear, dentition normal, ears w/o discharge/lesions/ulcers Neck: Supple, trachea  midline Cardiovascular: RRR, +S1, S2, no m/g/r, equal pulses throughout Respiratory: CTABL, no w/r/r, normal WOB GI: BS+, NDNT, no masses noted, no organomegaly noted MSK: No e/c/c Neuro: very drowsy, not following commands  Labs on Admission: I have personally reviewed following labs and imaging studies  CBC: Recent Labs  Lab 02/15/21 1021  WBC 4.3  NEUTROABS 1.9  HGB 12.0*  HCT 36.3*  MCV 103.7*  PLT 338   Basic Metabolic Panel: Recent Labs  Lab 02/15/21 1021  NA 139  K 4.1  CL 108  CO2 25  GLUCOSE 108*  BUN 15  CREATININE 0.87  CALCIUM 8.8*   GFR: Estimated Creatinine Clearance: 87.4 mL/min (by C-G formula based on SCr of 0.87 mg/dL). Liver Function Tests: Recent Labs  Lab 02/15/21 1021  AST 44*  ALT 41  ALKPHOS 167*  BILITOT 0.5  PROT 6.0*  ALBUMIN  2.9*   No results for input(s): LIPASE, AMYLASE in the last 168 hours. No results for input(s): AMMONIA in the last 168 hours. Coagulation Profile: Recent Labs  Lab 02/15/21 1255  INR 1.2   Cardiac Enzymes: No results for input(s): CKTOTAL, CKMB, CKMBINDEX, TROPONINI in the last 168 hours. BNP (last 3 results) No results for input(s): PROBNP in the last 8760 hours. HbA1C: No results for input(s): HGBA1C in the last 72 hours. CBG: Recent Labs  Lab 02/15/21 1019  GLUCAP 99   Lipid Profile: No results for input(s): CHOL, HDL, LDLCALC, TRIG, CHOLHDL, LDLDIRECT in the last 72 hours. Thyroid Function Tests: Recent Labs    02/15/21 1021  TSH 0.757   Anemia Panel: No results for input(s): VITAMINB12, FOLATE, FERRITIN, TIBC, IRON, RETICCTPCT in the last 72 hours. Urine analysis:    Component Value Date/Time   COLORURINE COLORLESS (A) 02/23/2018 1254   APPEARANCEUR CLEAR 02/23/2018 1254   LABSPEC 1.003 (L) 02/23/2018 1254   PHURINE 7.0 02/23/2018 1254   GLUCOSEU NEGATIVE 02/23/2018 1254   HGBUR NEGATIVE 02/23/2018 1254   BILIRUBINUR NEGATIVE 02/23/2018 1254   KETONESUR NEGATIVE 02/23/2018 1254    PROTEINUR NEGATIVE 02/23/2018 1254   NITRITE NEGATIVE 02/23/2018 1254   LEUKOCYTESUR NEGATIVE 02/23/2018 1254    Radiological Exams on Admission: DG Chest 2 View  Result Date: 02/15/2021 CLINICAL DATA:  Syncope. EXAM: CHEST - 2 VIEW COMPARISON:  February 23, 2018 FINDINGS: The cardiomediastinal silhouette is stable. No pneumothorax. Mild opacity in the right upper lobe, somewhat platelike. No other infiltrates. No nodules or masses. IMPRESSION: Mild opacity in the right upper lobe, somewhat platelike, favored to represent atelectasis. No other acute abnormalities. Electronically Signed   By: Dorise Bullion III M.D.   On: 02/15/2021 11:14   CT HEAD WO CONTRAST  Result Date: 02/15/2021 CLINICAL DATA:  Seizure EXAM: CT HEAD WITHOUT CONTRAST TECHNIQUE: Contiguous axial images were obtained from the base of the skull through the vertex without intravenous contrast. COMPARISON:  CT head 12/11/2020 FINDINGS: Brain: There is no evidence of acute intracranial hemorrhage, extra-axial fluid collection, or infarct. There is mild parenchymal volume loss with enlargement of the ventricular system, advanced for age. There is no significant burden of chronic white matter microangiopathy. There is no mass lesion. There is no midline shift. Vascular: There is calcification of the bilateral cavernous ICAs. Skull: Normal. Negative for fracture or focal lesion. Sinuses/Orbits: There is minimal mucosal thickening in the left maxillary sinus. The globes and orbits are unremarkable. Other: There is prominent calcification in the right nasopharynx, unchanged since 2011. IMPRESSION: 1. No acute intracranial pathology or epileptogenic focus identified. 2. Parenchymal volume loss with enlargement of the ventricular system, slightly advanced for age. Electronically Signed   By: Valetta Mole M.D.   On: 02/15/2021 11:13   CT CERVICAL SPINE WO CONTRAST  Result Date: 02/15/2021 CLINICAL DATA:  Unwitnessed seizure, found  unconscious EXAM: CT CERVICAL SPINE WITHOUT CONTRAST TECHNIQUE: Multidetector CT imaging of the cervical spine was performed without intravenous contrast. Multiplanar CT image reconstructions were also generated. COMPARISON:  CT cervical spine 12/11/2020 FINDINGS: Alignment: There is straightening of the cervical spine lordosis with mild focal kyphosis centered at C3, unchanged. There is trace retrolisthesis of C3 on C4, also unchanged. There is no evidence of traumatic malalignment. There is no jumped or perched facet. Skull base and vertebrae: Skull base alignment is normal. Vertebral body heights are preserved. There is no evidence of acute vertebral body fracture. Soft tissues and spinal canal: No prevertebral  fluid or swelling. No visible canal hematoma. Disc levels: There is marked multilevel intervertebral disc space narrowing, most advanced at C3-C4. There is associated multilevel uncovertebral and facet arthropathy resulting in mild spinal canal stenosis at C3-C4 and severe bilateral neural foraminal stenosis and numerous levels. Upper chest: The imaged portions of the lung apices are clear. Other: There is an acute mildly displaced fracture of the left mandibular condyle. Calcification in the right nasopharynx is unchanged going back to 2011 IMPRESSION: 1. No acute fracture or traumatic malalignment of the cervical spine. 2. Acute mildly displaced fracture of the left mandibular condyle. 3. Multilevel degenerative changes as above. Electronically Signed   By: Valetta Mole M.D.   On: 02/15/2021 11:26    EKG: Independently reviewed. Sinus brady, RBBB, no st elevation  Assessment/Plan Acute metabolic encephalopathy Seizure Sepsis w/ unknown source     - admit to inpt, stepdown     - check EEG, keppra levels     - run keppra IV 584m BID for now     - keep him NPO for now     - Bld Cx, UA/Ucx     - continue broad spec abx     - initially on Bair hugger, wean as able     - wean O2 as able     -  continue fluids  Urinary retention     - bladder scan at 770cc     - place coude; if unable to place, will need to speak w/ urology  Hypothyroidism     - continue home regimen when able to take PO  HTN     - currently hypotensive; hold home regimen  Seizure d/o     - continue keppra as IV; resume his other anti-epileptics when he can take PO  Elevated alk phos     - check ggt; rest of LFTs ok  Macrocytic anemia     - no evidence of bleed     - check B12, THF  Mandibular fracture     - appreciate plastics assistance     - get maxillofacial CT once patient is stable     - when alert enough to take PO; place on soft non-chew diet  DVT prophylaxis: lovenox  Code Status: FULL  Family Communication: w/ brother at bedside  Consults called: EDP spoke with plastic surgery and neuro.   Status is: Inpatient  Remains inpatient appropriate because:Inpatient level of care appropriate due to severity of illness  Dispo: The patient is from: Group home              Anticipated d/c is to: Group home              Patient currently is not medically stable to d/c.   Difficult to place patient No  Time spent coordinating admission: 75 minutes  [facility]  If 7PM-7AM, please contact night-coverage [email or web address]  02/15/2021, 3:20 PM

## 2021-02-15 NOTE — ED Triage Notes (Signed)
Patient coming from a group home with c/o possible unwitnessed seizure. Patient was eating breakfast and when staff return to check on him her was unconscious and incontinent. Patient have history of seizure.

## 2021-02-15 NOTE — ED Notes (Addendum)
Patient placed back on bairhugger

## 2021-02-15 NOTE — ED Notes (Signed)
Patient transported to CT 

## 2021-02-15 NOTE — Progress Notes (Signed)
A consult was received from an ED physician for Vancomycin and Cefepime per pharmacy dosing.  [name] profile has been reviewed for ht/wt/allergies/indication/available labs.   A one time order has been placed for Cefepime 2g IV and Vancomycin 1250mg  IV.  Further antibiotics/pharmacy consults should be ordered by admitting physician if indicated.                       Thank you, PharmD, BCPS Clinical Pharmacist [facility] main pharmacy [phone] 02/15/2021 10:50 AM

## 2021-02-15 NOTE — Plan of Care (Signed)
Discussed with father in front of patient plan of care for the evening, temperature and admission questions with some teach back displayed by the parent.  Problem: Education: Goal: Knowledge of General Education information will improve Description: Including pain rating scale, medication(s)/side effects and non-pharmacologic comfort measures Outcome: Progressing   Problem: Health Behavior/Discharge Planning: Goal: Ability to manage health-related needs will improve Outcome: Progressing

## 2021-02-15 NOTE — ED Provider Notes (Signed)
I provided a substantive portion of the care of this patient.  I personally performed the entirety of the medical decision making for this encounter.   55 year old male with history of Down syndrome who presents after having seizure prior to arrival.  Patient found to be hypotensive and hypothermic here.  Code sepsis called and treatment started.  He will require admission      Lorre Nick, MD 02/15/21 1103

## 2021-02-15 NOTE — ED Provider Notes (Signed)
[facility]-EMERGENCY DEPT Provider Note   CSN: [id number] Arrival date & time: 02/15/21  0944     History No chief complaint on file.   [name] is a 55 y.o. male with history of Down syndrome, hypertension, seizure disorder (on Keppra), bradycardia, syncope.  Presents to the emergency department with a chief complaint of change in mental status.  Patient is a resident of [facility].  I contacted Elberta Leatherwood who works at the group home.  He reports that patient.  Received his morning medications this morning at 0 800.  Patient was acting normal at that time.  Patient was found on the couch sleeping approximately 0 900.  Patient was walked to eat breakfast.  At 0915 Mr.Kessell noted that patient was not eating when he came to assess patient patient was not responding.  Patient was splashed with cold water and became responsive however was not acting at his normal mental baseline.  Patient was noted to be incontinent of urine.  Patient was not noted to have any seizure-like activity.  Patient was not noted to have any falls.  Patient has not had any fever or complaints of illness in the last few days.  At baseline patient is alert and pleasant however noncommunicative.  Level 5 caveat applies.  HPI     Past Medical History:  Diagnosis Date   Down's syndrome    GERD (gastroesophageal reflux disease)    Hypertension    Hypothyroid    Lives in group home    [phone]-Roxanne-nurse   Nystagmus    Obstructive sleep apnea (adult) (pediatric)    does use a cpap at night   Seizure disorder (HCC) 10/28/2016   Seizures (HCC)     Patient Active Problem List   Diagnosis Date Noted   Bradycardia, drug induced 02/24/2018   Syncope 02/23/2018   Seizure disorder (HCC) 10/28/2016   Obstructive sleep apnea 07/05/2008   Down syndrome 07/05/2008    Past Surgical History:  Procedure Laterality Date   IRRIGATION AND  DEBRIDEMENT  07/01/2014       ORIF HUMERUS FRACTURE Left 02/05/2013   Procedure: OPEN REDUCTION INTERNAL FIXATION (ORIF) MEDIAL HUMERUS CONDYLE FRACTURE;  Surgeon: Jodi Marble, MD;  Location: [facility];  Service: Orthopedics;  Laterality: Left;   Right Elbow surgery         Family History  Problem Relation Age of Onset   Dementia Maternal Grandmother    Diabetes Paternal Grandfather    Heart failure Paternal Grandfather    Hypertension Paternal Grandfather     Social History   Tobacco Use   Smoking status: Never   Smokeless tobacco: Never  Vaping Use   Vaping Use: Never used  Substance Use Topics   Alcohol use: No   Drug use: No    Home Medications Prior to Admission medications   Medication Sig Start Date End Date Taking? Authorizing Provider  acetaminophen (TYLENOL) 325 MG tablet Take 650 mg by mouth every 6 (six) hours as needed for mild pain.     [provider]  alum & mag hydroxide-simeth (MAALOX/MYLANTA) 200-200-20 MG/5ML suspension Take 15 mLs by mouth every 6 (six) hours as needed for indigestion or heartburn.    [provider]  amoxicillin (AMOXIL) 500 MG capsule Take 1 capsule (500 mg total) by mouth 3 (three) times daily. 12/18/20   Cathren Laine, MD  Artificial Tear Ointment (LACRI-LUBE OP) Apply to eye.    [provider]  benazepril (LOTENSIN) 10 MG tablet Take 10 mg by mouth every evening.     [provider]  bisacodyl (DULCOLAX) 10 MG suppository Place 10 mg rectally as needed for moderate constipation.    [provider]  calcitonin, salmon, (MIACALCIN/FORTICAL) 200 UNIT/ACT nasal spray Place 1 spray into alternate nostrils daily.    [provider]  cholecalciferol (VITAMIN D) 1000 units tablet Take 2,000 Units by mouth daily.    [provider]  diphenhydrAMINE (BENADRYL) 25 mg capsule Take 25 mg by mouth every 4 (four) hours as needed for itching or allergies.    [provider]  doxycycline (VIBRA-TABS) 100 MG tablet  06/18/18   [provider]  Hypromellose (ARTIFICIAL TEARS OP) Apply 1 drop to eye 3 (three) times daily.     [provider]  Infant Care Products (JOHNSONS BABY SHAMPOO) SHAM Apply 1 application topically daily. Mix 1/2 water and 1/2 shampoo, Scrub and apply on eyebrows and eyelid everyday.    [provider]  Lacosamide (VIMPAT) 150 MG TABS Take 1 tablet (150 mg total) by mouth 2 (two) times daily. Patient taking differently: Take 100 mg by mouth 2 (two) times daily. 03/08/18   [name], NP  lactulose (CHRONULAC) 10 GM/15ML solution Take 10 g by mouth daily as needed for mild constipation.     [provider]  levETIRAcetam (KEPPRA) 250 MG tablet Take 2 tablets (500 mg total) by mouth 2 (two) times daily. 04/06/18   Sheilah Pigeon, PA-C  levothyroxine (SYNTHROID, LEVOTHROID) 100 MCG tablet Take 100 mcg by mouth daily before breakfast.    [provider]  loperamide (IMODIUM) 2 MG capsule Take by mouth as needed for diarrhea or loose stools.    [provider]  Omega-3 Fatty Acids (FISH OIL) 500 MG CAPS Take by mouth 3 (three) times daily.    [provider]  promethazine (PHENERGAN) 25 MG tablet Take 25 mg by mouth every 6 (six) hours as needed for nausea or vomiting.    [provider]  pyridOXINE (VITAMIN B-6) 100 MG tablet Take 100 mg by mouth daily.    [provider]  SUNSCREEN SPF30 EX Apply 1 application topically daily as needed (prevent sunburn).    [provider]  terbinafine (LAMISIL) 1 % cream Apply 1 application topically daily.     [provider]    Allergies    Clonidine derivatives  Review of Systems   Review of Systems  Unable to perform ROS: Patient nonverbal   Physical Exam Updated Vital Signs SpO2 96%   Physical Exam Vitals and nursing note reviewed.  Constitutional:      General: He is not in acute  distress.    Appearance: He is not ill-appearing, toxic-appearing or diaphoretic.  HENT:     Head: Normocephalic and atraumatic. No raccoon eyes, Battle's sign, abrasion, contusion, masses or laceration.  Eyes:     General: No scleral icterus.       Right eye: No discharge.        Left eye: No discharge.  Cardiovascular:     Rate and Rhythm: Normal rate.  Pulmonary:     Effort: Pulmonary effort is normal.  Abdominal:     General: There is no distension. There are no signs of injury.     Palpations: Abdomen is soft. There is no mass or pulsatile mass.     Tenderness: There is no abdominal tenderness. There is no guarding or rebound.  Musculoskeletal:  Cervical back: Neck supple.     Comments: No midline tenderness or deformity to cervical, thoracic, or lumbar spine.  Skin:    General: Skin is warm and dry.  Neurological:     General: No focal deficit present.     Mental Status: He is alert.     GCS: GCS eye subscore is 2. GCS verbal subscore is 1. GCS motor subscore is 4.     Comments: Patient is noncommunicative at baseline.  On exam patient responds to painful stimuli.  Psychiatric:        Behavior: Behavior is cooperative.    ED Results / Procedures / Treatments   Labs (all labs ordered are listed, but only abnormal results are displayed) Labs Reviewed  BASIC METABOLIC PANEL - Abnormal; Notable for the following components:      Result Value   Glucose, Bld 108 (*)    Calcium 8.8 (*)    All other components within normal limits  CBC WITH DIFFERENTIAL/PLATELET - Abnormal; Notable for the following components:   RBC 3.50 (*)    Hemoglobin 12.0 (*)    HCT 36.3 (*)    MCV 103.7 (*)    MCH 34.3 (*)    All other components within normal limits  HEPATIC FUNCTION PANEL - Abnormal; Notable for the following components:   Total Protein 6.0 (*)    Albumin 2.9 (*)    AST 44 (*)    Alkaline Phosphatase 167 (*)    All other components within normal limits  CULTURE, BLOOD  (ROUTINE X 2)  CULTURE, BLOOD (ROUTINE X 2)  URINE CULTURE  RESP PANEL BY RT-PCR (FLU A&B, COVID) ARPGX2  LACTIC ACID, PLASMA  TSH  LEVETIRACETAM LEVEL  URINALYSIS, ROUTINE W REFLEX MICROSCOPIC  LACTIC ACID, PLASMA  PROTIME-INR  APTT  CBG MONITORING, ED  CBG MONITORING, ED  TROPONIN I (HIGH SENSITIVITY)  TROPONIN I (HIGH SENSITIVITY)    EKG EKG Interpretation  Date/Time:  Sunday February 15 2021 09:56:56 EDT Ventricular Rate:  47 PR Interval:  171 QRS Duration: 126 QT Interval:  483 QTC Calculation: 427 R Axis:   -28 Text Interpretation: Sinus bradycardia Right bundle branch block Left ventricular hypertrophy ST elev, probable normal early repol pattern Confirmed by Lorre Nick ([id number]) on 02/15/2021 11:59:59 AM  Radiology DG Chest 2 View  Result Date: 02/15/2021 CLINICAL DATA:  Syncope. EXAM: CHEST - 2 VIEW COMPARISON:  February 23, 2018 FINDINGS: The cardiomediastinal silhouette is stable. No pneumothorax. Mild opacity in the right upper lobe, somewhat platelike. No other infiltrates. No nodules or masses. IMPRESSION: Mild opacity in the right upper lobe, somewhat platelike, favored to represent atelectasis. No other acute abnormalities. Electronically Signed   By: Gerome Sam III M.D.   On: 02/15/2021 11:14   CT HEAD WO CONTRAST  Result Date: 02/15/2021 CLINICAL DATA:  Seizure EXAM: CT HEAD WITHOUT CONTRAST TECHNIQUE: Contiguous axial images were obtained from the base of the skull through the vertex without intravenous contrast. COMPARISON:  CT head 12/11/2020 FINDINGS: Brain: There is no evidence of acute intracranial hemorrhage, extra-axial fluid collection, or infarct. There is mild parenchymal volume loss with enlargement of the ventricular system, advanced for age. There is no significant burden of chronic white matter microangiopathy. There is no mass lesion. There is no midline shift. Vascular: There is calcification of the bilateral cavernous ICAs. Skull:  Normal. Negative for fracture or focal lesion. Sinuses/Orbits: There is minimal mucosal thickening in the left maxillary sinus. The globes and orbits are unremarkable. Other:  There is prominent calcification in the right nasopharynx, unchanged since 2011. IMPRESSION: 1. No acute intracranial pathology or epileptogenic focus identified. 2. Parenchymal volume loss with enlargement of the ventricular system, slightly advanced for age. Electronically Signed   By: Lesia Hausen M.D.   On: 02/15/2021 11:13   CT CERVICAL SPINE WO CONTRAST  Result Date: 02/15/2021 CLINICAL DATA:  Unwitnessed seizure, found unconscious EXAM: CT CERVICAL SPINE WITHOUT CONTRAST TECHNIQUE: Multidetector CT imaging of the cervical spine was performed without intravenous contrast. Multiplanar CT image reconstructions were also generated. COMPARISON:  CT cervical spine 12/11/2020 FINDINGS: Alignment: There is straightening of the cervical spine lordosis with mild focal kyphosis centered at C3, unchanged. There is trace retrolisthesis of C3 on C4, also unchanged. There is no evidence of traumatic malalignment. There is no jumped or perched facet. Skull base and vertebrae: Skull base alignment is normal. Vertebral body heights are preserved. There is no evidence of acute vertebral body fracture. Soft tissues and spinal canal: No prevertebral fluid or swelling. No visible canal hematoma. Disc levels: There is marked multilevel intervertebral disc space narrowing, most advanced at C3-C4. There is associated multilevel uncovertebral and facet arthropathy resulting in mild spinal canal stenosis at C3-C4 and severe bilateral neural foraminal stenosis and numerous levels. Upper chest: The imaged portions of the lung apices are clear. Other: There is an acute mildly displaced fracture of the left mandibular condyle. Calcification in the right nasopharynx is unchanged going back to 2011 IMPRESSION: 1. No acute fracture or traumatic malalignment of the  cervical spine. 2. Acute mildly displaced fracture of the left mandibular condyle. 3. Multilevel degenerative changes as above. Electronically Signed   By: Lesia Hausen M.D.   On: 02/15/2021 11:26    Procedures .Critical Care Performed by: Haskel Schroeder, PA-C Authorized by: Haskel Schroeder, PA-C   Critical care provider statement:    Critical care time (minutes):  45   Critical care was necessary to treat or prevent imminent or life-threatening deterioration of the following conditions:  Sepsis   Critical care was time spent personally by me on the following activities:  Discussions with consultants, evaluation of patient's response to treatment, examination of patient, ordering and performing treatments and interventions, ordering and review of laboratory studies, ordering and review of radiographic studies, pulse oximetry, re-evaluation of patient's condition, obtaining history from patient or surrogate and review of old charts   Care discussed with: admitting provider     Medications Ordered in ED Medications  lactated ringers infusion (has no administration in time range)  lactated ringers bolus 1,000 mL (1,000 mLs Intravenous New Bag/Given 02/15/21 1145)    And  lactated ringers bolus 1,000 mL (1,000 mLs Intravenous New Bag/Given 02/15/21 1138)    And  lactated ringers bolus 250 mL (has no administration in time range)  ceFEPIme (MAXIPIME) 2 g in sodium chloride 0.9 % 100 mL IVPB (has no administration in time range)  vancomycin (VANCOREADY) IVPB 1250 mg/250 mL (has no administration in time range)  metroNIDAZOLE (FLAGYL) IVPB 500 mg (500 mg Intravenous New Bag/Given 02/15/21 1135)    ED Course  I have reviewed the triage vital signs and the nursing notes.  Pertinent labs & imaging results that were available during my care of the patient were reviewed by me and considered in my medical decision making (see chart for details).  Clinical Course as of 02/15/21 1527  Sun  Feb 15, 2021  1211 Spoke with ENT Dr. Arita Miss who recommended putting patient on not  chewing diet.  He will see the patient for consult during his hospital admission. [PB]  1329 Spoke to hospitalist Dr. Ronaldo Miyamoto who will see the patient for admission. [PB]  1332 Spoke to neurologist Dr.Collins with neurology, advised that Keppra level and routine EEG at this time are appropriate.  If EEG shows anything concerning they will see the patient at [facility].  No need to move the patient to [facility] this time. [PB]    Clinical Course User Index [PB] Berneice Heinrich   MDM Rules/Calculators/A&P                           55 year old male in no acute distress, nontoxic appearing.  Patient is responsive to painful stimuli.  Withdraws from pain.  Once aroused patient spontaneously opens eyes, mouth and moves limbs.    Patient noted to be initially hypotensive at 72/43.  Rectal temperature 95.8 F.  Patient is bradycardic with heart rate in the 50s.  Due to hypothermia and hypotension concern for sepsis.  ED evolving sepsis work-up initiated at this time.  We will start patient on 30 mL/kg lactated ringer for bolus.  Patient started on broad-spectrum antibiotics.  Due to unwitnessed nature of patient's episode of unresponsiveness will obtain noncontrast head and cervical neck CT scan.  Noncontrast head CT is unremarkable.  CT cervical spine shows no acute fracture or traumatic malalignment of the cervical spine.  Acute mildly displaced fracture of the left mandibular condyle.  Due to patient's noted mandibular fracture will consult on-call maxillofacial physician.  Spoke to Dr. Arita Miss who will see the patient while admitted to the hospital.  Patient's brother at bedside reports that patient had dental work done 9 days prior.  Patient was placed under sedation for this procedure.  Unclear if this is a possible source of patient's injury.  BMP shows no electrolyte abnormality CBC shows no leukocytosis,  anemia with hemoglobin at 12.0 and hematocrit 36.3; appears stable when compared to labs obtained 2 years prior. Troponin 5 Lactic acid within normal limits at 1.6  After receiving 2 L LR bolus patient's pulse rate improved to 60s, BP 99/65.  Will consult hospitalist for admission at this time for likely sepsis.  Patient was discussed with and evaluated by Dr. Freida Busman.  Spoke to hospitalist Dr. Ronaldo Miyamoto who will see the patient for admission.  Requested consult to neurology to see if patient needs to be transferred to [facility] for further seizure work-up.  Spoke to neurologist Dr. Thomasena Edis who advised that Keppra level and routine EEG at this time are appropriate.  If EEG shows anything concerning they will see the patient at [facility].  No need to move the patient to [facility] this time    Final Clinical Impression(s) / ED Diagnoses Final diagnoses:  Sepsis, due to unspecified organism, unspecified whether acute organ dysfunction present [facility])  Bradycardia    Rx / DC Orders ED Discharge Orders     None        Berneice Heinrich 02/15/21 1528    Lorre Nick, MD 02/23/21 1540

## 2021-02-15 NOTE — Progress Notes (Signed)
Pharmacy Antibiotic Note  [name] is a 55 y.o. male admitted on 02/15/2021 with sepsis of unknown source.  Pharmacy has been consulted for vancomycin and cefepime dosing.  Plan: Cefepime 2g IV q8h, Flagyl 500mg  IV q12h Vancomycin 1250mg  IV given in ED, continue with 1750mg  IV q24h for estimated AUC 482 using SCr 0.87 Check vancomycin levels as needed, goal AUC 400-550 Follow up renal function & cultures    Temp (24hrs), Avg:95.3 F (35.2 C), Min:94.7 F (34.8 C), Max:95.8 F (35.4 C)  Recent Labs  Lab 02/15/21 1021 02/15/21 1255  WBC 4.3  --   CREATININE 0.87  --   LATICACIDVEN 1.6 1.1    Estimated Creatinine Clearance: 87.4 mL/min (by C-G formula based on SCr of 0.87 mg/dL).    Allergies  Allergen Reactions   Clonidine Derivatives Other (See Comments)    Causes symptomatic bradycardia with syncope and hypotension    Antimicrobials this admission:  9/18 Flagyl >> 9/18 Cefepime >> 9/18 Vanc >>  Dose adjustments this admission:   Microbiology results:  9/18 BCx: 9/18 UCx: 9/18 COVID/Flu: neg/neg  Thank you for allowing pharmacy to be a part of this patient's care.  10/18, PharmD, BCPS Pharmacy: [phone] 02/15/2021 6:03 PM

## 2021-02-15 NOTE — Consult Note (Signed)
Consulted for incidental finding of what appears to be acute mildly displaced fracture of left mandibular condyle.  This was found on ct head and neck done to evaluate him for fall after seizure.  Would recommend dedicated maxillofacial CT to fully visualize the mandible and maxilla.  This can be done when patient is stable enough to do so.  For the meantime recommend soft non-chew diet.  We will follow up on the imaging and evaluate patient to come up with an appropriate plan.

## 2021-02-16 ENCOUNTER — Inpatient Hospital Stay (HOSPITAL_COMMUNITY): Payer: Medicare Other

## 2021-02-16 ENCOUNTER — Inpatient Hospital Stay (HOSPITAL_COMMUNITY)
Admit: 2021-02-16 | Discharge: 2021-02-16 | Disposition: A | Discharge status: Home / Self Care | Payer: Medicare Other | Attending: Emergency Medicine | Admitting: Emergency Medicine

## 2021-02-16 ENCOUNTER — Encounter (HOSPITAL_COMMUNITY): Payer: Self-pay | Admitting: Internal Medicine

## 2021-02-16 DIAGNOSIS — R55 Syncope and collapse: Secondary | ICD-10-CM | POA: Diagnosis not present

## 2021-02-16 DIAGNOSIS — A419 Sepsis, unspecified organism: Secondary | ICD-10-CM

## 2021-02-16 DIAGNOSIS — R569 Unspecified convulsions: Secondary | ICD-10-CM

## 2021-02-16 LAB — COMPREHENSIVE METABOLIC PANEL WITH GFR
ALT: 41 U/L (ref 0–44)
AST: 44 U/L — ABNORMAL HIGH (ref 15–41)
Albumin: 2.8 g/dL — ABNORMAL LOW (ref 3.5–5.0)
Alkaline Phosphatase: 160 U/L — ABNORMAL HIGH (ref 38–126)
Anion gap: 7 (ref 5–15)
BUN: 12 mg/dL (ref 6–20)
CO2: 25 mmol/L (ref 22–32)
Calcium: 8.4 mg/dL — ABNORMAL LOW (ref 8.9–10.3)
Chloride: 106 mmol/L (ref 98–111)
Creatinine, Ser: 0.58 mg/dL — ABNORMAL LOW (ref 0.61–1.24)
GFR, Estimated: >60 mL/min
Glucose, Bld: 90 mg/dL (ref 70–99)
Potassium: 4.2 mmol/L (ref 3.5–5.1)
Sodium: 138 mmol/L (ref 135–145)
Total Bilirubin: 0.8 mg/dL (ref 0.3–1.2)
Total Protein: 5.7 g/dL — ABNORMAL LOW (ref 6.5–8.1)

## 2021-02-16 LAB — GLUCOSE, CAPILLARY
Glucose-Capillary: 81 mg/dL (ref 70–99)
Glucose-Capillary: 97 mg/dL (ref 70–99)
Glucose-Capillary: 114 mg/dL — ABNORMAL HIGH (ref 70–99)
Glucose-Capillary: 130 mg/dL — ABNORMAL HIGH (ref 70–99)
Glucose-Capillary: 137 mg/dL — ABNORMAL HIGH (ref 70–99)

## 2021-02-16 LAB — ECHOCARDIOGRAM COMPLETE
Area-P 1/2: 5.02 cm2
Height: 62 in
S' Lateral: 1.5 cm
Weight: 2694.9 oz

## 2021-02-16 LAB — CBC
HCT: 34.7 % — ABNORMAL LOW (ref 39.0–52.0)
Hemoglobin: 11.6 g/dL — ABNORMAL LOW (ref 13.0–17.0)
MCH: 34.1 pg — ABNORMAL HIGH (ref 26.0–34.0)
MCHC: 33.4 g/dL (ref 30.0–36.0)
MCV: 102.1 fL — ABNORMAL HIGH (ref 80.0–100.0)
Platelets: 227 10*3/uL (ref 150–400)
RBC: 3.4 MIL/uL — ABNORMAL LOW (ref 4.22–5.81)
RDW: 13.5 % (ref 11.5–15.5)
WBC: 3.7 10*3/uL — ABNORMAL LOW (ref 4.0–10.5)
nRBC: 0 % (ref 0.0–0.2)

## 2021-02-16 LAB — VITAMIN B12: Vitamin B-12: 297 pg/mL (ref 180–914)

## 2021-02-16 LAB — PROCALCITONIN: Procalcitonin: <0.1 ng/mL

## 2021-02-16 LAB — URINE CULTURE: Culture: NO GROWTH

## 2021-02-16 LAB — PROTIME-INR
INR: 1 (ref 0.8–1.2)
Prothrombin Time: 13.5 s (ref 11.4–15.2)

## 2021-02-16 LAB — CORTISOL-AM, BLOOD: Cortisol - AM: 12.7 ug/dL (ref 6.7–22.6)

## 2021-02-16 LAB — HIV ANTIBODY (ROUTINE TESTING W REFLEX): HIV Screen 4th Generation wRfx: NONREACTIVE

## 2021-02-16 MED ORDER — LACOSAMIDE 150 MG PO TABS: 150.0000 mg | ORAL_TABLET | Freq: Two times a day (BID) | ORAL | Status: DC

## 2021-02-16 MED ORDER — VITAMIN B-6 100 MG PO TABS
100.0000 mg | ORAL_TABLET | Freq: Every day | ORAL | Status: DC
  Administered 2021-02-16 – 2021-02-18 (×3): 100 mg via ORAL
  Filled 2021-02-16 (×3): qty 1

## 2021-02-16 MED ORDER — TETRAHYDROZOLINE HCL 0.05 % OP SOLN
1.0000 [drp] | Freq: Three times a day (TID) | OPHTHALMIC | Status: DC
  Administered 2021-02-16 – 2021-02-18 (×7): 1 [drp] via OPHTHALMIC
  Filled 2021-02-16: qty 15

## 2021-02-16 MED ORDER — JOHNSONS BABY SHAMPOO EX SHAM
1.0000 "application " | MEDICATED_SHAMPOO | Freq: Every day | CUTANEOUS | Status: DC
  Administered 2021-02-17: 1 via TOPICAL

## 2021-02-16 MED ORDER — LEVOTHYROXINE SODIUM 88 MCG PO TABS
88.0000 ug | ORAL_TABLET | Freq: Every morning | ORAL | Status: DC
  Administered 2021-02-16 – 2021-02-18 (×3): 88 ug via ORAL
  Filled 2021-02-16 (×3): qty 1

## 2021-02-16 MED ORDER — LACOSAMIDE 50 MG PO TABS
150.0000 mg | ORAL_TABLET | Freq: Two times a day (BID) | ORAL | Status: DC
  Administered 2021-02-16 – 2021-02-18 (×5): 150 mg via ORAL
  Filled 2021-02-16 (×5): qty 3

## 2021-02-16 MED ORDER — CYANOCOBALAMIN 1000 MCG/ML IJ SOLN
1000.0000 ug | Freq: Once | INTRAMUSCULAR | Status: AC
  Administered 2021-02-16: 1000 ug via INTRAMUSCULAR
  Filled 2021-02-16: qty 1

## 2021-02-16 MED ORDER — CALCITONIN (SALMON) 200 UNIT/ACT NA SOLN
1.0000 | Freq: Every day | NASAL | Status: DC
  Administered 2021-02-16 – 2021-02-18 (×3): 1 via NASAL
  Filled 2021-02-16: qty 3.7

## 2021-02-16 MED ORDER — LACTULOSE 10 GM/15ML PO SOLN
10.0000 g | Freq: Every morning | ORAL | Status: DC
  Administered 2021-02-16 – 2021-02-17 (×2): 10 g via ORAL
  Filled 2021-02-16 (×2): qty 15

## 2021-02-16 MED ORDER — MELATONIN 3 MG PO TABS
3.0000 mg | ORAL_TABLET | Freq: Every day | ORAL | Status: DC
  Administered 2021-02-16 – 2021-02-17 (×2): 3 mg via ORAL
  Filled 2021-02-16 (×2): qty 1

## 2021-02-16 MED ORDER — TOLNAFTATE 1 % EX AERP: 1.0000 "application " | INHALATION_SPRAY | Freq: Every day | CUTANEOUS | Status: DC

## 2021-02-16 MED ORDER — CICLOPIROX 8 % EX SOLN: 1.0000 "application " | CUTANEOUS | Status: DC

## 2021-02-16 MED ORDER — LORAZEPAM 2 MG/ML IJ SOLN
0.2500 mg | INTRAMUSCULAR | Status: DC | PRN
  Filled 2021-02-16: qty 1

## 2021-02-16 MED ORDER — OMEGA-3-ACID ETHYL ESTERS 1 G PO CAPS
1.0000 g | ORAL_CAPSULE | Freq: Every day | ORAL | Status: DC
  Administered 2021-02-16 – 2021-02-18 (×3): 1 g via ORAL
  Filled 2021-02-16 (×3): qty 1

## 2021-02-16 MED ORDER — HYDRALAZINE HCL 20 MG/ML IJ SOLN
5.0000 mg | INTRAMUSCULAR | Status: DC | PRN
  Administered 2021-02-16: 5 mg via INTRAVENOUS
  Filled 2021-02-16: qty 1

## 2021-02-16 NOTE — Progress Notes (Signed)
Patients labs rescheduled for 0800 when parent can be present

## 2021-02-16 NOTE — Progress Notes (Signed)
PROGRESS NOTE  [name] UEA:[id number] DOB: 09/27/1965 DOA: 02/15/2021 PCP: Lucretia Field   LOS: 1 day   Brief Narrative / Interim history: 55 year old male with history of Down syndrome, apparently nonverbal at baseline, recurrent falls, seizures, hypothyroidism, hypertension comes into the hospital with altered mental status.  He was in his normal state of health up until the morning of admission, during breakfast he was found unresponsive.  He was splashed with cold water, was reacting but was not himself.  He was incontinent of urine, no seizure-like activity noted.  He was brought to the ED where he was found to be hypothermic as low as 94.7 rectal and hypotensive with a blood pressure in the 70s on admission.  He was admitted to the hospital and empirically started on broad-spectrum antibiotics.  COVID was negative and blood and urine cultures were obtained on admission.  Case was discussed with neurology who recommended getting a Keppra level as well as obtaining a routine EEG.  CT C-spine on admission showed acute mildly displaced fracture of the left mandibular condyle.  Subjective / 24h Interval events: He is opening his eyes for me this morning, responds to name and looks at me but does not answer questions.  Assessment & Plan: Principal Problem Acute metabolic encephalopathy/syncope/seizure-unclear etiology right now.  There is concern for seizure given history of seizures as well as urinary incontinence -Continue Vimpat and Keppra, Keppra levels pending -Routine EEG pending -Urinalysis with rare bacteria but no WBC or nitrites.  COVID was negative.  Chest x-ray with mild opacity in the right upper lobe favored to represent atelectasis.  He has no respiratory symptoms. He was started on antibiotics but infection seems less likely given normal WBC and normal state of health just prior to this acute event. Procalcitonin less than 0.1 and lactic acid was unremarkable.  Keep  on antibiotics until tomorrow and discontinue then if cultures remain negative -TSH at 0.75, unremarkable -B12 borderline low but still within normal limits, I will give IM x1 -Folate normal  Active Problems Recurrent falls/history of syncope-patient is seen by cardiology as an outpatient for recurrent syncope and now being under observation.  Obtain a 2D echo, pending culture, echo, EEG results will see if discussing with cardiology is feasible  Hypothyroidism-continue home Synthroid  Essential hypertension-hold home regimen due to hypotension on admission  Macrocytic anemia-give B12  Mandibular fracture-appreciate plastics assistance, will get CT maxillofacial today  Scheduled Meds:  Chlorhexidine Gluconate Cloth  6 each Topical Daily   cyanocobalamin  1,000 mcg Intramuscular Once   enoxaparin (LOVENOX) injection  40 mg Subcutaneous Q24H   Lacosamide  150 mg Oral BID   Continuous Infusions:  ceFEPime (MAXIPIME) IV Stopped (02/16/21 0419)   levETIRAcetam 500 mg (02/16/21 0841)   metronidazole Stopped (02/15/21 2330)   vancomycin     PRN Meds:.ondansetron **OR** ondansetron (ZOFRAN) IV  Diet Orders (From admission, onward)     Start     Ordered   02/15/21 1045  Diet NPO time specified  (Septic presentation on arrival (screening labs, nursing and treatment orders for obvious sepsis))  Diet effective now        02/15/21 1046            DVT prophylaxis: enoxaparin (LOVENOX) injection 40 mg Start: 02/15/21 2200     Code Status: Full Code  Family Communication: No family at bedside, will call in the afternoon  Status is: Inpatient  Remains inpatient appropriate because:Inpatient level of care appropriate due to  severity of illness  Dispo: The patient is from: Group home              Anticipated d/c is to: Group home              Patient currently is not medically stable to d/c.   Difficult to place patient No   Level of care: Stepdown  Consultants:   None  Procedures:  2D echo: pending  Microbiology  Cultures blood, urine - NGTD  Antimicrobials: Vancomcyin 9/18 >> Cefepime 9/18 >> Metronidazole 9/18 >>   Objective: Vitals:   02/16/21 0500 02/16/21 0600 02/16/21 0642 02/16/21 0800  BP: 122/67 (!) 158/83  (!) 151/79  Pulse: 84 65  89  Resp: 13 17  14   Temp:   98.3 F (36.8 C) 98.2 F (36.8 C)  TempSrc:   Axillary Axillary  SpO2: 95% 95%  93%  Weight:      Height:        Intake/Output Summary (Last 24 hours) at 02/16/2021 0914 Last data filed at 02/16/2021 0715 Gross per 24 hour  Intake 2998.11 ml  Output 1000 ml  Net 1998.11 ml   Filed Weights   02/15/21 2100  Weight: 76.4 kg    Examination:  Constitutional: No apparent distress, wakes up when called Eyes: no scleral icterus ENMT: Mucous membranes are moist.  Neck: normal, supple Respiratory: clear to auscultation bilaterally, no wheezing, no crackles. Normal respiratory effort.  Cardiovascular: Regular rate and rhythm, no murmurs / rubs / gallops.  Abdomen: non distended, no tenderness. Bowel sounds positive.  Musculoskeletal: no clubbing / cyanosis.  Skin: no rashes Neurologic: Does not follow commands but appears to move all 4 extremities Psychiatric: Nonverbal, unable to assess  Data Reviewed: I have independently reviewed following labs and imaging studies   CBC: Recent Labs  Lab 02/15/21 1021 02/15/21 1936  WBC 4.3 4.1  NEUTROABS 1.9  --   HGB 12.0* 10.9*  HCT 36.3* 32.7*  MCV 103.7* 103.8*  PLT 252 226   Basic Metabolic Panel: Recent Labs  Lab 02/15/21 1021 02/15/21 1936 02/16/21 0747  NA 139  --  138  K 4.1  --  4.2  CL 108  --  106  CO2 25  --  25  GLUCOSE 108*  --  90  BUN 15  --  12  CREATININE 0.87 0.60* 0.58*  CALCIUM 8.8*  --  8.4*   Liver Function Tests: Recent Labs  Lab 02/15/21 1021 02/16/21 0747  AST 44* 44*  ALT 41 41  ALKPHOS 167* 160*  BILITOT 0.5 0.8  PROT 6.0* 5.7*  ALBUMIN 2.9* 2.8*   Coagulation  Profile: Recent Labs  Lab 02/15/21 1255 02/16/21 0747  INR 1.2 1.0   HbA1C: No results for input(s): HGBA1C in the last 72 hours. CBG: Recent Labs  Lab 02/15/21 1019 02/15/21 2325 02/16/21 0341 02/16/21 0802  GLUCAP 99 95 81 97    Recent Results (from the past 240 hour(s))  Blood culture (routine x 2)     Status: None (Preliminary result)   Collection Time: 02/15/21 11:22 AM   Specimen: BLOOD  Result Value Ref Range Status   Specimen Description   Final    BLOOD RIGHT ANTECUBITAL Performed at [facility], [address] [place], [place]    Special Requests   Final    BOTTLES DRAWN AEROBIC AND ANAEROBIC Blood Culture results may not be optimal due to an excessive volume of blood received in culture bottles  Performed at [facility], [address] [place], [place] [address]    Culture   Final    NO GROWTH < 24 HOURS Performed at [facility], [address] [place], [place] [address]    Report Status PENDING  Incomplete  Blood culture (routine x 2)     Status: None (Preliminary result)   Collection Time: 02/15/21 11:30 AM   Specimen: BLOOD  Result Value Ref Range Status   Specimen Description   Final    BLOOD BLOOD RIGHT FOREARM Performed at [facility], [address] [place], [place] [address]    Special Requests   Final    BOTTLES DRAWN AEROBIC AND ANAEROBIC Blood Culture results may not be optimal due to an excessive volume of blood received in culture bottles Performed at [facility], [address], [place], [place] [address]    Culture   Final    NO GROWTH < 24 HOURS Performed at [facility], [name], [place], [place] [address]    Report Status PENDING  Incomplete  Resp Panel by RT-PCR (Flu A&B, Covid) Nasopharyngeal Swab     Status: None   Collection Time: 02/15/21 11:47 AM   Specimen: Nasopharyngeal Swab; Nasopharyngeal(NP) swabs in vial  transport medium  Result Value Ref Range Status   SARS Coronavirus 2 by RT PCR NEGATIVE NEGATIVE Final    Comment: (NOTE) SARS-CoV-2 target nucleic acids are NOT DETECTED.  The SARS-CoV-2 RNA is generally detectable in upper respiratory specimens during the acute phase of infection. The lowest concentration of SARS-CoV-2 viral copies this assay can detect is 138 copies/mL. A negative result does not preclude SARS-Cov-2 infection and should not be used as the sole basis for treatment or other patient management decisions. A negative result may occur with  improper specimen collection/handling, submission of specimen other than nasopharyngeal swab, presence of viral mutation(s) within the areas targeted by this assay, and inadequate number of viral copies(<138 copies/mL). A negative result must be combined with clinical observations, patient history, and epidemiological information. The expected result is Negative.  Fact Sheet for Patients:  BloggerCourse.com  Fact Sheet for Healthcare Providers:  SeriousBroker.it  This test is no t yet approved or cleared by the [place] FDA and  has been authorized for detection and/or diagnosis of SARS-CoV-2 by FDA under an Emergency Use Authorization (EUA). This EUA will remain  in effect (meaning this test can be used) for the duration of the COVID-19 declaration under Section 564(b)(1) of the Act, 21 U.S.C.section 360bbb-3(b)(1), unless the authorization is terminated  or revoked sooner.       Influenza A by PCR NEGATIVE NEGATIVE Final   Influenza B by PCR NEGATIVE NEGATIVE Final    Comment: (NOTE) The Xpert Xpress SARS-CoV-2/FLU/RSV plus assay is intended as an aid in the diagnosis of influenza from Nasopharyngeal swab specimens and should not be used as a sole basis for treatment. Nasal washings and aspirates are unacceptable for Xpert Xpress SARS-CoV-2/FLU/RSV testing.  Fact  Sheet for Patients: BloggerCourse.com  Fact Sheet for Healthcare Providers: SeriousBroker.it  This test is not yet approved or cleared by the [place] FDA and has been authorized for detection and/or diagnosis of SARS-CoV-2 by FDA under an Emergency Use Authorization (EUA). This EUA will remain in effect (meaning this test can be used) for the duration of the COVID-19 declaration under Section 564(b)(1) of the Act, 21 U.S.C. section 360bbb-3(b)(1), unless the authorization is terminated or  revoked.  Performed at [facility], [address], [place], [place] [address]   MRSA Next Gen by PCR, Nasal     Status: None   Collection Time: 02/15/21  9:33 PM   Specimen: Nasal Mucosa; Nasal Swab  Result Value Ref Range Status   MRSA by PCR Next Gen NOT DETECTED NOT DETECTED Final    Comment: (NOTE) The GeneXpert MRSA Assay (FDA approved for NASAL specimens only), is one component of a comprehensive MRSA colonization surveillance program. It is not intended to diagnose MRSA infection nor to guide or monitor treatment for MRSA infections. Test performance is not FDA approved in patients less than 16 years old. Performed at [facility], [address], [place], [place] [address]      Radiology Studies: DG Chest 2 View  Result Date: 02/15/2021 CLINICAL DATA:  Syncope. EXAM: CHEST - 2 VIEW COMPARISON:  February 23, 2018 FINDINGS: The cardiomediastinal silhouette is stable. No pneumothorax. Mild opacity in the right upper lobe, somewhat platelike. No other infiltrates. No nodules or masses. IMPRESSION: Mild opacity in the right upper lobe, somewhat platelike, favored to represent atelectasis. No other acute abnormalities. Electronically Signed   By: Gerome Sam III M.D.   On: 02/15/2021 11:14   CT HEAD WO CONTRAST  Result Date: 02/15/2021 CLINICAL DATA:  Seizure EXAM: CT HEAD WITHOUT CONTRAST  TECHNIQUE: Contiguous axial images were obtained from the base of the skull through the vertex without intravenous contrast. COMPARISON:  CT head 12/11/2020 FINDINGS: Brain: There is no evidence of acute intracranial hemorrhage, extra-axial fluid collection, or infarct. There is mild parenchymal volume loss with enlargement of the ventricular system, advanced for age. There is no significant burden of chronic white matter microangiopathy. There is no mass lesion. There is no midline shift. Vascular: There is calcification of the bilateral cavernous ICAs. Skull: Normal. Negative for fracture or focal lesion. Sinuses/Orbits: There is minimal mucosal thickening in the left maxillary sinus. The globes and orbits are unremarkable. Other: There is prominent calcification in the right nasopharynx, unchanged since 2011. IMPRESSION: 1. No acute intracranial pathology or epileptogenic focus identified. 2. Parenchymal volume loss with enlargement of the ventricular system, slightly advanced for age. Electronically Signed   By: Lesia Hausen M.D.   On: 02/15/2021 11:13   CT CERVICAL SPINE WO CONTRAST  Result Date: 02/15/2021 CLINICAL DATA:  Unwitnessed seizure, found unconscious EXAM: CT CERVICAL SPINE WITHOUT CONTRAST TECHNIQUE: Multidetector CT imaging of the cervical spine was performed without intravenous contrast. Multiplanar CT image reconstructions were also generated. COMPARISON:  CT cervical spine 12/11/2020 FINDINGS: Alignment: There is straightening of the cervical spine lordosis with mild focal kyphosis centered at C3, unchanged. There is trace retrolisthesis of C3 on C4, also unchanged. There is no evidence of traumatic malalignment. There is no jumped or perched facet. Skull base and vertebrae: Skull base alignment is normal. Vertebral body heights are preserved. There is no evidence of acute vertebral body fracture. Soft tissues and spinal canal: No prevertebral fluid or swelling. No visible canal hematoma.  Disc levels: There is marked multilevel intervertebral disc space narrowing, most advanced at C3-C4. There is associated multilevel uncovertebral and facet arthropathy resulting in mild spinal canal stenosis at C3-C4 and severe bilateral neural foraminal stenosis and numerous levels. Upper chest: The imaged portions of the lung apices are clear. Other: There is an acute mildly displaced fracture of the left mandibular condyle. Calcification in the right nasopharynx is unchanged going back to 2011 IMPRESSION: 1. No  acute fracture or traumatic malalignment of the cervical spine. 2. Acute mildly displaced fracture of the left mandibular condyle. 3. Multilevel degenerative changes as above. Electronically Signed   By: Lesia Hausen M.D.   On: 02/15/2021 11:26    Pamella Pert, MD, PhD [facility]  Between 7 am - 7 pm I am available, please contact me via Amion (for emergencies) or Securechat (non urgent messages)  Between 7 pm - 7 am I am not available, please contact night coverage MD/APP via Amion

## 2021-02-16 NOTE — Progress Notes (Signed)
  Echocardiogram 2D Echocardiogram has been performed.  [name] 02/16/2021, 1:53 PM

## 2021-02-16 NOTE — TOC Initial Note (Signed)
Transition of Care [facility]) - Initial/Assessment Note    Patient Details  Name: [name] MRN: [id number] Date of Birth: [birth year]  Transition of Care [facility]) CM/SW Contact:    Golda Acre, RN Phone Number: 02/16/2021, 9:15 AM  Clinical Narrative:                 55 y.o. male with medical history significant of Down's syndrome, seizure d/o, hypothyroidism, HTN. Presenting w/ altered mental status. History is per chart review as patient is somnolent/confused and family at bedside is unaware of his history. Apparently the patient was in his normal state of health until this morning. During breakfast, he was found to be unresponsive. He was splashed with cold water and he reacted but was not his normal self. It was noted that he was incontinent of urine, but no tonic-clonic activity was noted. The facility became concerned, and sent him to the ED.    ED Course: He was found to be hypotensive and hypothermic when he arrived. He was weakly responsive to noxious stimuli. CTH was negative for acute lesion. An acute mildly displaced fracture of the left mandibular condyle was noted on CT c-spine. Plastics was consulted. He was started on broad spec abx. Neuro was consulted. TRH was called for admission.  TOC PLAN OF CARE: Living with parents, plan is to return to home.  Following for progression and toc needs. Legal guardian is the father: [name] at [phone] Expected Discharge Plan: Home/Self Care Barriers to Discharge: Continued Medical Work up   Patient Goals and CMS Choice Patient states their goals for this hospitalization and ongoing recovery are:: uanable to state due to condition CMS Medicare.gov Compare Post Acute Care list provided to:: Legal Guardian    Expected Discharge Plan and Services Expected Discharge Plan: Home/Self Care   Discharge Planning Services: CM Consult   Living arrangements for the past 2 months: Single Family Home                                       Prior Living Arrangements/Services Living arrangements for the past 2 months: Single Family Home Lives with:: Parents Patient language and need for interpreter reviewed:: Yes Do you feel safe going back to the place where you live?: Yes            Criminal Activity/Legal Involvement Pertinent to Current Situation/Hospitalization: No - Comment as needed  Activities of Daily Living Home Assistive Devices/Equipment: Eyeglasses ADL Screening (condition at time of admission) Patient's cognitive ability adequate to safely complete daily activities?: No Is the patient deaf or have difficulty hearing?: No Does the patient have difficulty seeing, even when wearing glasses/contacts?: Yes Does the patient have difficulty concentrating, remembering, or making decisions?: Yes Patient able to express need for assistance with ADLs?: No Does the patient have difficulty dressing or bathing?: Yes Independently performs ADLs?: No Communication: Dependent Is this a change from baseline?: Pre-admission baseline Dressing (OT): Dependent Is this a change from baseline?: Pre-admission baseline Grooming: Dependent Is this a change from baseline?: Pre-admission baseline Feeding: Independent, Needs assistance (after setup) Bathing: Dependent Is this a change from baseline?: Pre-admission baseline Toileting: Needs assistance Is this a change from baseline?: Pre-admission baseline In/Out Bed: Needs assistance Is this a change from baseline?: Pre-admission baseline Walks in Home: Needs assistance Is this a change from baseline?: Pre-admission baseline Does the patient have difficulty walking or climbing stairs?:  No Weakness of Legs: None Weakness of Arms/Hands: None  Permission Sought/Granted                  Emotional Assessment Appearance:: Appears stated age     Orientation: : Oriented to Self, Oriented to Place, Oriented to  Time, Oriented to Situation Alcohol /  Substance Use: Not Applicable Psych Involvement: No (comment)  Admission diagnosis:  Bradycardia [R00.1] SIRS (systemic inflammatory response syndrome) (HCC) [R65.10] Sepsis, due to unspecified organism, unspecified whether acute organ dysfunction present [facility]) [A41.9] Patient Active Problem List   Diagnosis Date Noted   SIRS (systemic inflammatory response syndrome) (HCC) 02/15/2021   Bradycardia, drug induced 02/24/2018   Syncope 02/23/2018   Seizure disorder (HCC) 10/28/2016   Obstructive sleep apnea 07/05/2008   Down syndrome 07/05/2008   PCP:  Lucretia Field Pharmacy:   [facility] [place], [place] - [address] [place] [address] Phone: [phone] Fax: [phone]     Social Determinants of Health (SDOH) Interventions    Readmission Risk Interventions No flowsheet data found.

## 2021-02-16 NOTE — Procedures (Signed)
Patient Name: [name]  MRN: [id number]  Epilepsy Attending: Charlsie Quest  Referring Physician/Provider: Haskel Schroeder, PA-C Date: 02/16/2021 Duration: 22.31 mins  Patient history: 55yo m with h/o epilepsy, now with ams. EEG to evaluate for seizure.   Level of alertness: Awake  AEDs during EEG study: LEV, LCM  Technical aspects: This EEG study was done with scalp electrodes positioned according to the 10-20 International system of electrode placement. Electrical activity was acquired at a sampling rate of 500Hz  and reviewed with a high frequency filter of 70Hz  and a low frequency filter of 1Hz . EEG data were recorded continuously and digitally stored.   Description:  No posterior dominant rhythm was seen. EEG showed continuous generalized 3 to 6 Hz theta-delta slowing. Generalized, maximal bifrontal spike and wave complex were noted. Hyperventilation and photic stimulation were not performed.     Of note, this study was technically difficult due to significant movement artifact.   ABNORMALITY -Spike and wave,generalized - Continuous slow, generalized  IMPRESSION: This technically difficult study is consistent with patient's known history of epilepsy with generalized onset.There is also moderate diffuse encephalopathy, non etiology. No seizures were seen throughout the recording.  [name] 

## 2021-02-16 NOTE — Progress Notes (Signed)
EEG completed, results pending. 

## 2021-02-17 DIAGNOSIS — S02612A Fracture of condylar process of left mandible, initial encounter for closed fracture: Secondary | ICD-10-CM

## 2021-02-17 DIAGNOSIS — R001 Bradycardia, unspecified: Secondary | ICD-10-CM

## 2021-02-17 LAB — GLUCOSE, CAPILLARY
Glucose-Capillary: 118 mg/dL — ABNORMAL HIGH (ref 70–99)
Glucose-Capillary: 104 mg/dL — ABNORMAL HIGH (ref 70–99)
Glucose-Capillary: 139 mg/dL — ABNORMAL HIGH (ref 70–99)
Glucose-Capillary: 124 mg/dL — ABNORMAL HIGH (ref 70–99)

## 2021-02-17 MED ORDER — ORAL CARE MOUTH RINSE
15.0000 mL | Freq: Two times a day (BID) | OROMUCOSAL | Status: DC
  Administered 2021-02-17 (×2): 15 mL via OROMUCOSAL

## 2021-02-17 NOTE — Progress Notes (Addendum)
PROGRESS NOTE  [name] WJX:[id number] DOB: [birth year] DOA: 02/15/2021 PCP: Lucretia Field   LOS: 2 days   Brief Narrative / Interim history: 55 year old male with history of Down syndrome, apparently nonverbal at baseline, recurrent falls, seizures, hypothyroidism, hypertension comes into the hospital with altered mental status.  He was in his normal state of health up until the morning of admission, during breakfast he was found unresponsive.  He was splashed with cold water, was reacting but was not himself.  He was incontinent of urine, no seizure-like activity noted.  He was brought to the ED where he was found to be hypothermic as low as 94.7 rectal and hypotensive with a blood pressure in the 70s on admission.  He was admitted to the hospital and empirically started on broad-spectrum antibiotics.  COVID was negative and blood and urine cultures were obtained on admission.  Case was discussed with neurology who recommended getting a Keppra level as well as obtaining a routine EEG.  CT C-spine on admission showed acute mildly displaced fracture of the left mandibular condyle.  Subjective / 24h Interval events: Much more alert today, father is at bedside.  Remains nonverbal but does not appear to be in any distress and is comfortable  Assessment & Plan: Principal Problem Acute metabolic encephalopathy/syncope/seizure-unclear etiology right now,?  Vasovagal.  There is concern for seizure given history of seizures as well as urinary incontinence, EEG negative for epileptiform discharges -Continue Vimpat and Keppra, Keppra levels pending -Urinalysis with rare bacteria but no WBC or nitrites.  COVID was negative.  Chest x-ray with mild opacity in the right upper lobe favored to represent atelectasis.  He has no respiratory symptoms. He was started on antibiotics but infection seems less likely given normal WBC and normal state of health just prior to this acute event. Procalcitonin less  than 0.1 and lactic acid was unremarkable.  His cultures are negative today at 48 hours and I will discontinue his antibiotics. -TSH and cortisol normal -B12 borderline low but still within normal limits, I will give IM x1 -Folate normal -He appears back to baseline per father, alert, no longer hypothermic.  Antibiotics will be stopped today.  If he remains stable off antibiotics without further issues anticipate he could be discharged to his group home tomorrow.  Transfer out of stepdown today  Active Problems Recurrent falls/history of syncope-patient is seen by cardiology as an outpatient for recurrent syncope and now being under observation.  2D echo fairly unremarkable  Hypothyroidism-continue home Synthroid  Essential hypertension-hold home regimen due to hypotension on admission  Macrocytic anemia-give B12  Mandibular fracture-CT maxillofacial obtained yesterday showed mildly displaced fracture through the head/neck of the left mandible.  It is not clear how this fracture happened, he has no overlying ecchymosis to suggest injury, no reported tonic-clonic seizures either.  He did have dental work recently so not sure if he could potentially be related to that.  Discussed with Dr. Steffanie Dunn with plastics, he will evaluate patient again today   Scheduled Meds:  calcitonin (salmon)  1 spray Alternating Nares Daily   Chlorhexidine Gluconate Cloth  6 each Topical Daily   [START ON 02/18/2021] ciclopirox  1 application Topical Q Wed-1800   enoxaparin (LOVENOX) injection  40 mg Subcutaneous Q24H   Johnsons Baby Shampoo  1 application Topical Daily   lacosamide  150 mg Oral BID   lactulose  10 g Oral q morning   levothyroxine  88 mcg Oral q morning   melatonin  3 mg Oral QHS   omega-3 acid ethyl esters  1 g Oral Daily   pyridOXINE  100 mg Oral Daily   tetrahydrozoline  1 drop Both Eyes TID   tolnaftate  1 application Topical QHS   Continuous Infusions:  levETIRAcetam Stopped (02/17/21  0817)   PRN Meds:.hydrALAZINE, LORazepam, ondansetron **OR** ondansetron (ZOFRAN) IV  Diet Orders (From admission, onward)     Start     Ordered   02/16/21 1233  DIET - DYS 1 Room service appropriate? Yes; Fluid consistency: Thin  Diet effective now       Question Answer Comment  Room service appropriate? Yes   Fluid consistency: Thin      02/16/21 1232            DVT prophylaxis: enoxaparin (LOVENOX) injection 40 mg Start: 02/15/21 2200     Code Status: Full Code  Family Communication: Father present at bedside  Status is: Inpatient  Remains inpatient appropriate because:Inpatient level of care appropriate due to severity of illness  Dispo: The patient is from: Group home              Anticipated d/c is to: Group home              Patient currently is not medically stable to d/c.   Difficult to place patient No   Level of care: Med-Surg  Consultants:  None  Procedures:  2D echo  1. Left ventricular ejection fraction, by estimation, is >75%. The left ventricle has hyperdynamic function. The left ventricle has no regional wall motion abnormalities. Left ventricular diastolic parameters were normal. Elevated left atrial pressure.   2. Right ventricular systolic function is normal. The right ventricular size is normal. There is normal pulmonary artery systolic pressure.   3. The mitral valve is grossly normal. Trivial mitral valve regurgitation.   4. The aortic valve is tricuspid. Aortic valve regurgitation is not visualized. Mild aortic valve sclerosis is present, with no evidence of aortic valve stenosis.   Microbiology  Cultures blood, urine - NGTD  Antimicrobials: Vancomcyin 9/18 >> 9/20 Cefepime 9/18 >> 9/20 Metronidazole 9/18 >> 9/20   Objective: Vitals:   02/17/21 0846 02/17/21 0900 02/17/21 1000 02/17/21 1100  BP:  127/70 121/89 119/69  Pulse:  63 (!) 58 (!) 55  Resp:  11 13 13   Temp: 97.7 F (36.5 C)     TempSrc: Oral     SpO2:  95% 95% 95%   Weight:      Height:        Intake/Output Summary (Last 24 hours) at 02/17/2021 1136 Last data filed at 02/17/2021 1100 Gross per 24 hour  Intake 1174.08 ml  Output 3200 ml  Net -2025.92 ml    Filed Weights   02/15/21 2100  Weight: 76.4 kg    Examination:  Constitutional: He is in no distress, alert Eyes: Anicteric ENMT: Moist mucous membranes Neck: normal, supple Respiratory: CTA bilaterally without wheezing or crackles Cardiovascular: Regular rate and rhythm, no murmurs Abdomen: Soft, NT, ND, bowel sounds positive Musculoskeletal: no clubbing / cyanosis.  Skin: No rashes seen Neurologic: Nonfocal Psychiatric: Nonverbal, unable to assess  Data Reviewed: I have independently reviewed following labs and imaging studies   CBC: Recent Labs  Lab 02/15/21 1021 02/15/21 1936 02/16/21 0833  WBC 4.3 4.1 3.7*  NEUTROABS 1.9  --   --   HGB 12.0* 10.9* 11.6*  HCT 36.3* 32.7* 34.7*  MCV 103.7* 103.8* 102.1*  PLT 252 226 227  Basic Metabolic Panel: Recent Labs  Lab 02/15/21 1021 02/15/21 1936 02/16/21 0747  NA 139  --  138  K 4.1  --  4.2  CL 108  --  106  CO2 25  --  25  GLUCOSE 108*  --  90  BUN 15  --  12  CREATININE 0.87 0.60* 0.58*  CALCIUM 8.8*  --  8.4*    Liver Function Tests: Recent Labs  Lab 02/15/21 1021 02/16/21 0747  AST 44* 44*  ALT 41 41  ALKPHOS 167* 160*  BILITOT 0.5 0.8  PROT 6.0* 5.7*  ALBUMIN 2.9* 2.8*    Coagulation Profile: Recent Labs  Lab 02/15/21 1255 02/16/21 0747  INR 1.2 1.0    HbA1C: No results for input(s): HGBA1C in the last 72 hours. CBG: Recent Labs  Lab 02/16/21 1717 02/16/21 2034 02/16/21 2323 02/17/21 0330 02/17/21 0824  GLUCAP 114* 130* 137* 118* 104*     Recent Results (from the past 240 hour(s))  Blood culture (routine x 2)     Status: None (Preliminary result)   Collection Time: 02/15/21 11:22 AM   Specimen: BLOOD  Result Value Ref Range Status   Specimen Description   Final    BLOOD  RIGHT ANTECUBITAL Performed at [facility], [address] [place], [place] [address]    Special Requests   Final    BOTTLES DRAWN AEROBIC AND ANAEROBIC Blood Culture results may not be optimal due to an excessive volume of blood received in culture bottles Performed at [facility], [address], [place], [place] [address]    Culture   Final    NO GROWTH 2 DAYS Performed at [facility], [address] [place], [place] [address]    Report Status PENDING  Incomplete  Blood culture (routine x 2)     Status: None (Preliminary result)   Collection Time: 02/15/21 11:30 AM   Specimen: BLOOD  Result Value Ref Range Status   Specimen Description   Final    BLOOD BLOOD RIGHT FOREARM Performed at [facility], [address] [place], [place] [address]    Special Requests   Final    BOTTLES DRAWN AEROBIC AND ANAEROBIC Blood Culture results may not be optimal due to an excessive volume of blood received in culture bottles Performed at [facility], [address], [place], [place] [address]    Culture   Final    NO GROWTH 2 DAYS Performed at [facility], [address], [place], [place] [address]    Report Status PENDING  Incomplete  Resp Panel by RT-PCR (Flu A&B, Covid) Nasopharyngeal Swab     Status: None   Collection Time: 02/15/21 11:47 AM   Specimen: Nasopharyngeal Swab; Nasopharyngeal(NP) swabs in vial transport medium  Result Value Ref Range Status   SARS Coronavirus 2 by RT PCR NEGATIVE NEGATIVE Final    Comment: (NOTE) SARS-CoV-2 target nucleic acids are NOT DETECTED.  The SARS-CoV-2 RNA is generally detectable in upper respiratory specimens during the acute phase of infection. The lowest concentration of SARS-CoV-2 viral copies this assay can detect is 138 copies/mL. A negative result does not preclude SARS-Cov-2 infection and should not be used as the sole basis for treatment  or other patient management decisions. A negative result may occur with  improper specimen collection/handling, submission of specimen other than nasopharyngeal swab, presence of viral mutation(s) within the areas targeted by this assay, and inadequate number of  viral copies(<138 copies/mL). A negative result must be combined with clinical observations, patient history, and epidemiological information. The expected result is Negative.  Fact Sheet for Patients:  BloggerCourse.com  Fact Sheet for Healthcare Providers:  SeriousBroker.it  This test is no t yet approved or cleared by the [place] FDA and  has been authorized for detection and/or diagnosis of SARS-CoV-2 by FDA under an Emergency Use Authorization (EUA). This EUA will remain  in effect (meaning this test can be used) for the duration of the COVID-19 declaration under Section 564(b)(1) of the Act, 21 U.S.C.section 360bbb-3(b)(1), unless the authorization is terminated  or revoked sooner.       Influenza A by PCR NEGATIVE NEGATIVE Final   Influenza B by PCR NEGATIVE NEGATIVE Final    Comment: (NOTE) The Xpert Xpress SARS-CoV-2/FLU/RSV plus assay is intended as an aid in the diagnosis of influenza from Nasopharyngeal swab specimens and should not be used as a sole basis for treatment. Nasal washings and aspirates are unacceptable for Xpert Xpress SARS-CoV-2/FLU/RSV testing.  Fact Sheet for Patients: BloggerCourse.com  Fact Sheet for Healthcare Providers: SeriousBroker.it  This test is not yet approved or cleared by the [place] FDA and has been authorized for detection and/or diagnosis of SARS-CoV-2 by FDA under an Emergency Use Authorization (EUA). This EUA will remain in effect (meaning this test can be used) for the duration of the COVID-19 declaration under Section 564(b)(1) of the Act, 21 U.S.C. section  360bbb-3(b)(1), unless the authorization is terminated or revoked.  Performed at [facility], [address], [place], [place] [address]   Urine Culture     Status: None   Collection Time: 02/15/21  5:54 PM   Specimen: In/Out Cath Urine  Result Value Ref Range Status   Specimen Description   Final    IN/OUT CATH URINE Performed at [facility], [address], [place], [place] [address]    Special Requests   Final    NONE Performed at [facility], [address], [place], [place] [address]    Culture   Final    NO GROWTH Performed at [facility], [address] [place], [place] [address]    Report Status Mar 11, 2021 FINAL  Final  MRSA Next Gen by PCR, Nasal     Status: None   Collection Time: 02/15/21  9:33 PM   Specimen: Nasal Mucosa; Nasal Swab  Result Value Ref Range Status   MRSA by PCR Next Gen NOT DETECTED NOT DETECTED Final    Comment: (NOTE) The GeneXpert MRSA Assay (FDA approved for NASAL specimens only), is one component of a comprehensive MRSA colonization surveillance program. It is not intended to diagnose MRSA infection nor to guide or monitor treatment for MRSA infections. Test performance is not FDA approved in patients less than 49 years old. Performed at [facility], [address] [place], [place] [address]       Radiology Studies: EEG adult  Result Date: March 11, 2021 Charlsie Quest, MD     Mar 11, 2021  2:31 PM Patient Name: ABRON NEDDO MRN: [id number] Epilepsy Attending: Charlsie Quest Referring Physician/Provider: Haskel Schroeder, PA-C Date: March 11, 2021 Duration: 22.31 mins Patient history: 55yo m with h/o epilepsy, now with ams. EEG to evaluate for seizure. Level of alertness: Awake AEDs during EEG study: LEV, LCM Technical aspects: This EEG study was done with scalp electrodes positioned according to the 10-20 International system of electrode placement.  Electrical activity  was acquired at a sampling rate of 500Hz  and reviewed with a high frequency filter of 70Hz  and a low frequency filter of 1Hz . EEG data were recorded continuously and digitally stored. Description:  No posterior dominant rhythm was seen. EEG showed continuous generalized 3 to 6 Hz theta-delta slowing. Generalized, maximal bifrontal spike and wave complex were noted. Hyperventilation and photic stimulation were not performed.   Of note, this study was technically difficult due to significant movement artifact. ABNORMALITY -Spike and wave,generalized - Continuous slow, generalized IMPRESSION: This technically difficult study is consistent with patient's known history of epilepsy with generalized onset.There is also moderate diffuse encephalopathy, non etiology. No seizures were seen throughout the recording.   ECHOCARDIOGRAM COMPLETE  Result Date: 02/16/2021    ECHOCARDIOGRAM REPORT   Patient Name:   LERAY GARVERICK Date of Exam: 02/16/2021 Medical Rec #:  02/18/2021            Height:       62.0 in Accession #:    [id number] Shone           Weight:       168.4 lb Date of Birth:  [birth year]            BSA:          1.777 m Patient Age:    55 years             BP:           145/84 mmHg Patient Gender: M                    HR:           95 bpm. Exam Location:  Inpatient Procedure: 2D Echo, Color Doppler and Cardiac Doppler Indications:    R55 Syncope  History:        Patient has prior history of Echocardiogram examinations, most                 recent 02/24/2018. Risk Factors:Hypertension and Down's Syndrome.  Sonographer:    09/17/1965 RDCS Referring Phys: [phone] 02/26/2018 [facility]  Sonographer Comments: No subcostal window. IMPRESSIONS  1. Left ventricular ejection fraction, by estimation, is >75%. The left ventricle has hyperdynamic function. The left ventricle has no regional wall motion abnormalities. Left ventricular diastolic parameters were normal. Elevated left atrial  pressure.  2. Right ventricular systolic function is normal. The right ventricular size is normal. There is normal pulmonary artery systolic pressure.  3. The mitral valve is grossly normal. Trivial mitral valve regurgitation.  4. The aortic valve is tricuspid. Aortic valve regurgitation is not visualized. Mild aortic valve sclerosis is present, with no evidence of aortic valve stenosis. FINDINGS  Left Ventricle: Left ventricular ejection fraction, by estimation, is >75%. The left ventricle has hyperdynamic function. The left ventricle has no regional wall motion abnormalities. The left ventricular internal cavity size was small. There is no left  ventricular hypertrophy. Left ventricular diastolic parameters were normal. Elevated left atrial pressure. Right Ventricle: The right ventricular size is normal. Right vetricular wall thickness was not assessed. Right ventricular systolic function is normal. There is normal pulmonary artery systolic pressure. The tricuspid regurgitant velocity is 2.20 m/s, and with an assumed right atrial pressure of 3 mmHg, the estimated right ventricular systolic pressure is 22.4 mmHg. Left Atrium: Left atrial size was normal in size. Right Atrium: Right atrial size was normal in size. Pericardium: There is no evidence of pericardial effusion. Mitral Valve:  The mitral valve is grossly normal. Trivial mitral valve regurgitation. Tricuspid Valve: The tricuspid valve is normal in structure. Tricuspid valve regurgitation is mild. Aortic Valve: The aortic valve is tricuspid. Aortic valve regurgitation is not visualized. Mild aortic valve sclerosis is present, with no evidence of aortic valve stenosis. Pulmonic Valve: The pulmonic valve was not well visualized. Pulmonic valve regurgitation is not visualized. Aorta: The aortic root is normal in size and structure. IAS/Shunts: No atrial level shunt detected by color flow Doppler.  LEFT VENTRICLE PLAX 2D LVIDd:         2.70 cm  Diastology LVIDs:          1.50 cm  LV e' medial:    8.27 cm/s LV PW:         1.00 cm  LV E/e' medial:  17.3 LV IVS:        0.90 cm  LV e' lateral:   7.29 cm/s LVOT diam:     2.30 cm  LV E/e' lateral: 19.6 LV SV:         67 LV SV Index:   38 LVOT Area:     4.15 cm  RIGHT VENTRICLE RV S prime:     19.30 cm/s TAPSE (M-mode): 3.0 cm LEFT ATRIUM             Index       RIGHT ATRIUM          Index LA diam:        2.60 cm 1.46 cm/m  RA Area:     7.33 cm LA Vol (A2C):   21.7 ml 12.21 ml/m RA Volume:   15.10 ml 8.50 ml/m LA Vol (A4C):   25.9 ml 14.57 ml/m LA Biplane Vol: 24.7 ml 13.90 ml/m  AORTIC VALVE LVOT Vmax:   78.40 cm/s LVOT Vmean:  61.700 cm/s LVOT VTI:    0.161 m  AORTA Ao Root diam: 3.30 cm Ao Asc diam:  3.20 cm MITRAL VALVE                TRICUSPID VALVE MV Area (PHT): 5.02 cm     TR Peak grad:   19.4 mmHg MV Decel Time: 151 msec     TR Vmax:        220.00 cm/s MV E velocity: 143.00 cm/s MV A velocity: 126.00 cm/s  SHUNTS MV E/A ratio:  1.13         Systemic VTI:  0.16 m                             Systemic Diam: 2.30 cm Dietrich Pates MD Electronically signed by Dietrich Pates MD Signature Date/Time: 02/16/2021/4:58:13 PM    Final    CT MAXILLOFACIAL WO CONTRAST  Result Date: 02/16/2021 CLINICAL DATA:  Dental facial anomaly EXAM: CT MAXILLOFACIAL WITHOUT CONTRAST TECHNIQUE: Multidetector CT imaging of the maxillofacial structures was performed. Multiplanar CT image reconstructions were also generated. COMPARISON:  CT head 02/16/2019. FINDINGS: Osseous: Mildly displaced fracture through the head/neck of the left mandible (series 4, images 48-50 and series 7, image 58). No additional fracture is seen in the mandible. No other acute facial bone fracture. Orbits: Negative. No traumatic or inflammatory finding. Sinuses: Minimal mucosal thickening.  Otherwise clear. Soft tissues: Negative. Limited intracranial: No significant or unexpected finding. IMPRESSION: Mildly displaced fracture through the head/neck of the left mandible. No  additional fracture is seen. Electronically Signed   By: Elaina Pattee.D.  On: 02/16/2021 19:29    Pamella Pert, MD, PhD [facility]  Between 7 am - 7 pm I am available, please contact me via Amion (for emergencies) or Securechat (non urgent messages)  Between 7 pm - 7 am I am not available, please contact night coverage MD/APP via Amion

## 2021-02-17 NOTE — Consult Note (Signed)
Reason for Consult/CC: Left mandibular condyle fracture  [name] is an 55 y.o. male.  HPI: Patient presented to the emergency room a few days ago with altered mental status.  Unknown reason.  Is been admitted for the past few days in the ICU.  Due to the altered mental status CT scan was done of his head and neck which incidentally showed a minimally displaced fracture of the left mandibular condyle.  Patient has since become more alert and his dad is at bedside.  He is tolerating soft diet fine.  Does not appear to have any pain in the face or jaw itself.  Allergies:  Allergies  Allergen Reactions   Clonidine Derivatives Other (See Comments)    Causes symptomatic bradycardia with syncope and hypotension    Medications:  Current Facility-Administered Medications:    calcitonin (salmon) (MIACALCIN/FORTICAL) nasal spray 1 spray, 1 spray, Alternating Nares, Daily, Elvera Lennox, Daylene Katayama, MD, 1 spray at 02/17/21 1025   Chlorhexidine Gluconate Cloth 2 % PADS 6 each, 6 each, Topical, Daily, Kyle, Tyrone A, DO, 6 each at 02/16/21 1700   [START ON 02/18/2021] ciclopirox (PENLAC) 8 % solution 1 application, 1 application, Topical, Q Wed-1800, Gherghe, Costin M, MD   enoxaparin (LOVENOX) injection 40 mg, 40 mg, Subcutaneous, Q24H, Kyle, Tyrone A, DO, 40 mg at 02/16/21 2128   hydrALAZINE (APRESOLINE) injection 5 mg, 5 mg, Intravenous, Q4H PRN, Leatha Gilding, MD, 5 mg at 02/16/21 2206   Johnsons Baby Shampoo SHAM 1 application, 1 application, Topical, Daily, Leatha Gilding, MD, 1 application at 02/17/21 1026   lacosamide (VIMPAT) tablet 150 mg, 150 mg, Oral, BID, Elvera Lennox, Costin M, MD, 150 mg at 02/17/21 1022   lactulose (CHRONULAC) 10 GM/15ML solution 10 g, 10 g, Oral, q morning, Gherghe, Costin M, MD, 10 g at 02/17/21 1034   levETIRAcetam (KEPPRA) IVPB 500 mg/100 mL premix, 500 mg, Intravenous, Q12H, Kyle, Tyrone A, DO, Stopped at 02/17/21 1610   levothyroxine (SYNTHROID) tablet 88 mcg, 88  mcg, Oral, q morning, Leatha Gilding, MD, 88 mcg at 02/17/21 0603   LORazepam (ATIVAN) injection 0.25 mg, 0.25 mg, Intravenous, Q5 Min x 2 PRN, Elvera Lennox, Daylene Katayama, MD   melatonin tablet 3 mg, 3 mg, Oral, QHS, Luiz Iron, NP, 3 mg at 02/16/21 2328   omega-3 acid ethyl esters (LOVAZA) capsule 1 g, 1 g, Oral, Daily, Gherghe, Costin M, MD, 1 g at 02/17/21 1023   ondansetron (ZOFRAN) tablet 4 mg, 4 mg, Oral, Q6H PRN **OR** ondansetron (ZOFRAN) injection 4 mg, 4 mg, Intravenous, Q6H PRN, Ronaldo Miyamoto, Tyrone A, DO   pyridOXINE (VITAMIN B-6) tablet 100 mg, 100 mg, Oral, Daily, Elvera Lennox, Costin M, MD, 100 mg at 02/17/21 1022   tetrahydrozoline 0.05 % ophthalmic solution 1 drop, 1 drop, Both Eyes, TID, Gherghe, Costin M, MD, 1 drop at 02/17/21 1025   tolnaftate (TINACTIN) 1 % spray 1 application, 1 application, Topical, QHS, Gherghe, Costin M, MD  Past Medical History:  Diagnosis Date   Down's syndrome    GERD (gastroesophageal reflux disease)    Hypertension    Hypothyroid    Lives in group home    [facility]-[id number]-[phone]-Roxanne-nurse   Nystagmus    Obstructive sleep apnea (adult) (pediatric)    does use a cpap at night   Seizure disorder (HCC) 10/28/2016   Seizures (HCC)     Past Surgical History:  Procedure Laterality Date   IRRIGATION AND DEBRIDEMENT  07/01/2014       ORIF HUMERUS  FRACTURE Left 02/05/2013   Procedure: OPEN REDUCTION INTERNAL FIXATION (ORIF) MEDIAL HUMERUS CONDYLE FRACTURE;  Surgeon: Jodi Marble, MD;  Location: [facility];  Service: Orthopedics;  Laterality: Left;   Right Elbow surgery      Family History  Problem Relation Age of Onset   Dementia Maternal Grandmother    Diabetes Paternal Grandfather    Heart failure Paternal Grandfather    Hypertension Paternal Grandfather     Social History:  reports that he has never smoked. He has never used smokeless tobacco. He reports that he does not drink alcohol and does not use  drugs.  Physical Exam Blood pressure 119/69, pulse (!) 55, temperature 98.4 F (36.9 C), temperature source Oral, resp. rate 13, height 5\' 2"  (1.575 m), weight 76.4 kg, SpO2 95 %. General: No acute distress.  Interactive at bedside. HEENT: No obvious signs of external trauma.  No lacerations.  No bruising or swelling.  Cranial nerves appear to be grossly intact.  Dentition is difficult to evaluate as he is missing several teeth due to recent falls in the past couple months.  No pain to palpation of the TMJ that I can tell.  Extraocular movements intact.  Results for orders placed or performed during the hospital encounter of 02/15/21 (from the past 48 hour(s))  Urinalysis, Routine w reflex microscopic Urine, Clean Catch     Status: Abnormal   Collection Time: 02/15/21  5:54 PM  Result Value Ref Range   Color, Urine YELLOW (A) YELLOW   APPearance CLEAR (A) CLEAR   Specific Gravity, Urine 1.010 1.005 - 1.030   pH 6.5 5.0 - 8.0   Glucose, UA NEGATIVE NEGATIVE mg/dL   Hgb urine dipstick NEGATIVE NEGATIVE   Bilirubin Urine NEGATIVE NEGATIVE   Ketones, ur NEGATIVE NEGATIVE mg/dL   Protein, ur NEGATIVE NEGATIVE mg/dL   Nitrite NEGATIVE NEGATIVE   Leukocytes,Ua NEGATIVE NEGATIVE   RBC / HPF 0-5 0 - 5 RBC/hpf   WBC, UA 0-5 0 - 5 WBC/hpf   Bacteria, UA RARE (A) NONE SEEN   Mucus PRESENT     Comment: Performed at [facility], [address], [place], [place]  Urine Culture     Status: None   Collection Time: 02/15/21  5:54 PM   Specimen: In/Out Cath Urine  Result Value Ref Range   Specimen Description      IN/OUT CATH URINE Performed at [facility], [address] [place], [place]    Special Requests      NONE Performed at [facility], [address], [place], [place]    Culture      NO GROWTH Performed at [facility], [address] [facility], [place], [place]    Report Status  02/16/2021 FINAL   HIV Antibody (routine testing w rflx)     Status: None   Collection Time: 02/15/21  7:36 PM  Result Value Ref Range   HIV Screen 4th Generation wRfx Non Reactive Non Reactive    Comment: Performed at [facility], [address], [place], [place]  CBC     Status: Abnormal   Collection Time: 02/15/21  7:36 PM  Result Value Ref Range   WBC 4.1 4.0 - 10.5 K/uL   RBC 3.15 (L) 4.22 - 5.81 MIL/uL   Hemoglobin 10.9 (L) 13.0 - 17.0 g/dL   HCT 02/17/21 (L) 17.5 - 10.2 %   MCV 103.8 (H)  80.0 - 100.0 fL   MCH 34.6 (H) 26.0 - 34.0 pg   MCHC 33.3 30.0 - 36.0 g/dL   RDW 75.6 43.3 - 29.5 %   Platelets 226 150 - 400 K/uL   nRBC 0.0 0.0 - 0.2 %    Comment: Performed at [facility], [address], [place], [place] [address]  Creatinine, serum     Status: Abnormal   Collection Time: 02/15/21  7:36 PM  Result Value Ref Range   Creatinine, Ser 0.60 (L) 0.61 - 1.24 mg/dL   GFR, Estimated >66 >06 mL/min    Comment: (NOTE) Calculated using the CKD-EPI Creatinine Equation (2021) Performed at [facility], [address] [place], [place] [address]   Vitamin B12     Status: None   Collection Time: 02/15/21  7:36 PM  Result Value Ref Range   Vitamin B-12 297 180 - 914 pg/mL    Comment: (NOTE) This assay is not validated for testing neonatal or myeloproliferative syndrome specimens for Vitamin B12 levels. Performed at [facility], [address], [place], [place] [address]   Folate, serum, performed at [facility] lab     Status: None   Collection Time: 02/15/21  7:36 PM  Result Value Ref Range   Folate 10.7 >5.9 ng/mL    Comment: Performed at [facility], [address] [place], [place] [address]  Gamma GT     Status: None   Collection Time: 02/15/21  7:36 PM  Result Value Ref Range   GGT 28 7 - 50 U/L    Comment: Performed at [facility], [address],  [place], [place] [address]  MRSA Next Gen by PCR, Nasal     Status: None   Collection Time: 02/15/21  9:33 PM   Specimen: Nasal Mucosa; Nasal Swab  Result Value Ref Range   MRSA by PCR Next Gen NOT DETECTED NOT DETECTED    Comment: (NOTE) The GeneXpert MRSA Assay (FDA approved for NASAL specimens only), is one component of a comprehensive MRSA colonization surveillance program. It is not intended to diagnose MRSA infection nor to guide or monitor treatment for MRSA infections. Test performance is not FDA approved in patients less than 59 years old. Performed at [facility], [address], [place], [place] [address]   Glucose, capillary     Status: None   Collection Time: 02/15/21 11:25 PM  Result Value Ref Range   Glucose-Capillary 95 70 - 99 mg/dL    Comment: Glucose reference range applies only to samples taken after fasting for at least 8 hours.   Comment 1 Notify RN    Comment 2 Document in Chart   Glucose, capillary     Status: None   Collection Time: 02/16/21  3:41 AM  Result Value Ref Range   Glucose-Capillary 81 70 - 99 mg/dL    Comment: Glucose reference range applies only to samples taken after fasting for at least 8 hours.   Comment 1 Notify RN    Comment 2 Document in Chart   Protime-INR     Status: None   Collection Time: 02/16/21  7:47 AM  Result Value Ref Range   Prothrombin Time 13.5 11.4 - 15.2 seconds   INR 1.0 0.8 - 1.2    Comment: (NOTE) INR goal varies based on device and disease states. Performed at [facility], [address], [place], [place] [address]   Cortisol-am, blood  Status: None   Collection Time: 02/16/21  7:47 AM  Result Value Ref Range   Cortisol - AM 12.7 6.7 - 22.6 ug/dL    Comment: Performed at [facility], [address] [place], [place] [address]  Procalcitonin     Status: None   Collection Time: 02/16/21  7:47 AM  Result Value Ref Range   Procalcitonin <0.10 ng/mL    Comment:         Interpretation: PCT (Procalcitonin) <= 0.5 ng/mL: Systemic infection (sepsis) is not likely. Local bacterial infection is possible. (NOTE)       Sepsis PCT Algorithm           Lower Respiratory Tract                                      Infection PCT Algorithm    ----------------------------     ----------------------------         PCT < 0.25 ng/mL                PCT < 0.10 ng/mL          Strongly encourage             Strongly discourage   discontinuation of antibiotics    initiation of antibiotics    ----------------------------     -----------------------------       PCT 0.25 - 0.50 ng/mL            PCT 0.10 - 0.25 ng/mL               OR       >80% decrease in PCT            Discourage initiation of                                            antibiotics      Encourage discontinuation           of antibiotics    ----------------------------     -----------------------------         PCT >= 0.50 ng/mL              PCT 0.26 - 0.50 ng/mL               AND        <80% decrease in PCT             Encourage initiation of                                             antibiotics       Encourage continuation           of antibiotics    ----------------------------     -----------------------------        PCT >= 0.50 ng/mL                  PCT > 0.50 ng/mL               AND         increase in PCT  Strongly encourage                                      initiation of antibiotics    Strongly encourage escalation           of antibiotics                                     -----------------------------                                           PCT <= 0.25 ng/mL                                                 OR                                        > 80% decrease in PCT                                      Discontinue / Do not initiate                                             antibiotics  Performed at [facility], [address], [place], [place] [address]   Comprehensive metabolic panel     Status: Abnormal   Collection Time: 02/16/21  7:47 AM  Result Value Ref Range   Sodium 138 135 - 145 mmol/L   Potassium 4.2 3.5 - 5.1 mmol/L   Chloride 106 98 - 111 mmol/L   CO2 25 22 - 32 mmol/L   Glucose, Bld 90 70 - 99 mg/dL    Comment: Glucose reference range applies only to samples taken after fasting for at least 8 hours.   BUN 12 6 - 20 mg/dL   Creatinine, Ser 6.04 (L) 0.61 - 1.24 mg/dL   Calcium 8.4 (L) 8.9 - 10.3 mg/dL   Total Protein 5.7 (L) 6.5 - 8.1 g/dL   Albumin 2.8 (L) 3.5 - 5.0 g/dL   AST 44 (H) 15 - 41 U/L   ALT 41 0 - 44 U/L   Alkaline Phosphatase 160 (H) 38 - 126 U/L   Total Bilirubin 0.8 0.3 - 1.2 mg/dL   GFR, Estimated >54 >09 mL/min    Comment: (NOTE) Calculated using the CKD-EPI Creatinine Equation (2021)    Anion gap 7 5 - 15    Comment: Performed at [facility], [address] [place], [place] [address]  Glucose, capillary     Status: None   Collection Time: 02/16/21  8:02 AM  Result Value Ref Range   Glucose-Capillary 97 70 - 99 mg/dL    Comment: Glucose reference range applies only to samples taken after fasting for at least 8 hours.  Comment 1 Document in Chart   CBC     Status: Abnormal   Collection Time: 02/16/21  8:33 AM  Result Value Ref Range   WBC 3.7 (L) 4.0 - 10.5 K/uL   RBC 3.40 (L) 4.22 - 5.81 MIL/uL   Hemoglobin 11.6 (L) 13.0 - 17.0 g/dL   HCT 16.1 (L) 09.6 - 04.5 %   MCV 102.1 (H) 80.0 - 100.0 fL   MCH 34.1 (H) 26.0 - 34.0 pg   MCHC 33.4 30.0 - 36.0 g/dL   RDW 40.9 81.1 - 91.4 %   Platelets 227 150 - 400 K/uL   nRBC 0.0 0.0 - 0.2 %    Comment: Performed at [facility], [address], [place], [place] [address]  Glucose, capillary     Status: Abnormal   Collection Time: 02/16/21  5:17 PM  Result Value Ref Range   Glucose-Capillary 114 (H) 70 - 99 mg/dL    Comment: Glucose reference range applies only to samples taken after  fasting for at least 8 hours.   Comment 1 Notify RN    Comment 2 Document in Chart   Glucose, capillary     Status: Abnormal   Collection Time: 02/16/21  8:34 PM  Result Value Ref Range   Glucose-Capillary 130 (H) 70 - 99 mg/dL    Comment: Glucose reference range applies only to samples taken after fasting for at least 8 hours.  Glucose, capillary     Status: Abnormal   Collection Time: 02/16/21 11:23 PM  Result Value Ref Range   Glucose-Capillary 137 (H) 70 - 99 mg/dL    Comment: Glucose reference range applies only to samples taken after fasting for at least 8 hours.  Glucose, capillary     Status: Abnormal   Collection Time: 02/17/21  3:30 AM  Result Value Ref Range   Glucose-Capillary 118 (H) 70 - 99 mg/dL    Comment: Glucose reference range applies only to samples taken after fasting for at least 8 hours.  Glucose, capillary     Status: Abnormal   Collection Time: 02/17/21  8:24 AM  Result Value Ref Range   Glucose-Capillary 104 (H) 70 - 99 mg/dL    Comment: Glucose reference range applies only to samples taken after fasting for at least 8 hours.   Comment 1 Notify RN    Comment 2 Document in Chart   Glucose, capillary     Status: Abnormal   Collection Time: 02/17/21 11:59 AM  Result Value Ref Range   Glucose-Capillary 139 (H) 70 - 99 mg/dL    Comment: Glucose reference range applies only to samples taken after fasting for at least 8 hours.   Comment 1 Notify RN    Comment 2 Document in Chart     EEG adult  Result Date: 02/16/2021 Charlsie Quest, MD     02/16/2021  2:31 PM Patient Name: KWANE ROHL MRN: [id number] Epilepsy Attending: Charlsie Quest Referring Physician/Provider: Haskel Schroeder, PA-C Date: 02/16/2021 Duration: 22.31 mins Patient history: 55yo m with h/o epilepsy, now with ams. EEG to evaluate for seizure. Level of alertness: Awake AEDs during EEG study: LEV, LCM Technical aspects: This EEG study was done with scalp electrodes positioned  according to the 10-20 International system of electrode placement. Electrical activity was acquired at a sampling rate of  and reviewed with a high frequency filter of  and a low frequency filter of . EEG data were recorded continuously and digitally stored. Description:  No posterior dominant rhythm was seen. EEG showed continuous generalized 3 to 6 Hz theta-delta slowing. Generalized, maximal bifrontal spike and wave complex were noted. Hyperventilation and photic stimulation were not performed.   Of note, this study was technically difficult due to significant movement artifact. ABNORMALITY -Spike and wave,generalized - Continuous slow, generalized IMPRESSION: This technically difficult study is consistent with patient's known history of epilepsy with generalized onset.There is also moderate diffuse encephalopathy, non etiology. No seizures were seen throughout the recording. Charlsie Quest   ECHOCARDIOGRAM COMPLETE  Result Date: 02/16/2021    ECHOCARDIOGRAM REPORT   Patient Name:   TALAL ADAMY Date of Exam: 02/16/2021 Medical Rec #:  [id number]            Height:       62.0 in Accession #:    [id number]           Weight:       168.4 lb Date of Birth:  [birth year]            BSA:          1.777 m Patient Age:    55 years             BP:           145/84 mmHg Patient Gender: M                    HR:           95 bpm. Exam Location:  Inpatient Procedure: 2D Echo, Color Doppler and Cardiac Doppler Indications:    R55 Syncope  History:        Patient has prior history of Echocardiogram examinations, most                 recent 02/24/2018. Risk Factors:Hypertension and Down's Syndrome.  Sonographer:    Eulah Pont RDCS Referring Phys: [id number] Daylene Katayama [facility]  Sonographer Comments: No subcostal window. IMPRESSIONS  1. Left ventricular ejection fraction, by estimation, is >75%. The left ventricle has hyperdynamic function. The left ventricle has no regional wall motion abnormalities. Left  ventricular diastolic parameters were normal. Elevated left atrial pressure.  2. Right ventricular systolic function is normal. The right ventricular size is normal. There is normal pulmonary artery systolic pressure.  3. The mitral valve is grossly normal. Trivial mitral valve regurgitation.  4. The aortic valve is tricuspid. Aortic valve regurgitation is not visualized. Mild aortic valve sclerosis is present, with no evidence of aortic valve stenosis. FINDINGS  Left Ventricle: Left ventricular ejection fraction, by estimation, is >75%. The left ventricle has hyperdynamic function. The left ventricle has no regional wall motion abnormalities. The left ventricular internal cavity size was small. There is no left  ventricular hypertrophy. Left ventricular diastolic parameters were normal. Elevated left atrial pressure. Right Ventricle: The right ventricular size is normal. Right vetricular wall thickness was not assessed. Right ventricular systolic function is normal. There is normal pulmonary artery systolic pressure. The tricuspid regurgitant velocity is 2.20 m/s, and with an assumed right atrial pressure of 3 mmHg, the estimated right ventricular systolic pressure is 22.4 mmHg. Left Atrium: Left atrial size was normal in size. Right Atrium: Right atrial size was normal in size. Pericardium: There is no evidence of pericardial effusion. Mitral Valve: The mitral valve is grossly normal. Trivial mitral valve regurgitation. Tricuspid Valve: The tricuspid valve is normal in structure. Tricuspid valve regurgitation is mild. Aortic Valve: The aortic valve is tricuspid. Aortic valve regurgitation  is not visualized. Mild aortic valve sclerosis is present, with no evidence of aortic valve stenosis. Pulmonic Valve: The pulmonic valve was not well visualized. Pulmonic valve regurgitation is not visualized. Aorta: The aortic root is normal in size and structure. IAS/Shunts: No atrial level shunt detected by color flow Doppler.   LEFT VENTRICLE PLAX 2D LVIDd:         2.70 cm  Diastology LVIDs:         1.50 cm  LV e' medial:    8.27 cm/s LV PW:         1.00 cm  LV E/e' medial:  17.3 LV IVS:        0.90 cm  LV e' lateral:   7.29 cm/s LVOT diam:     2.30 cm  LV E/e' lateral: 19.6 LV SV:         67 LV SV Index:   38 LVOT Area:     4.15 cm  RIGHT VENTRICLE RV S prime:     19.30 cm/s TAPSE (M-mode): 3.0 cm LEFT ATRIUM             Index       RIGHT ATRIUM          Index LA diam:        2.60 cm 1.46 cm/m  RA Area:     7.33 cm LA Vol (A2C):   21.7 ml 12.21 ml/m RA Volume:   15.10 ml 8.50 ml/m LA Vol (A4C):   25.9 ml 14.57 ml/m LA Biplane Vol: 24.7 ml 13.90 ml/m  AORTIC VALVE LVOT Vmax:   78.40 cm/s LVOT Vmean:  61.700 cm/s LVOT VTI:    0.161 m  AORTA Ao Root diam: 3.30 cm Ao Asc diam:  3.20 cm MITRAL VALVE                TRICUSPID VALVE MV Area (PHT): 5.02 cm     TR Peak grad:   19.4 mmHg MV Decel Time: 151 msec     TR Vmax:        220.00 cm/s MV E velocity: 143.00 cm/s MV A velocity: 126.00 cm/s  SHUNTS MV E/A ratio:  1.13         Systemic VTI:  0.16 m                             Systemic Diam: 2.30 cm Dietrich Pates MD Electronically signed by Dietrich Pates MD Signature Date/Time: 02/16/2021/4:58:13 PM    Final    CT MAXILLOFACIAL WO CONTRAST  Result Date: 02/16/2021 CLINICAL DATA:  Dental facial anomaly EXAM: CT MAXILLOFACIAL WITHOUT CONTRAST TECHNIQUE: Multidetector CT imaging of the maxillofacial structures was performed. Multiplanar CT image reconstructions were also generated. COMPARISON:  CT head 02/16/2019. FINDINGS: Osseous: Mildly displaced fracture through the head/neck of the left mandible (series 4, images 48-50 and series 7, image 58). No additional fracture is seen in the mandible. No other acute facial bone fracture. Orbits: Negative. No traumatic or inflammatory finding. Sinuses: Minimal mucosal thickening.  Otherwise clear. Soft tissues: Negative. Limited intracranial: No significant or unexpected finding. IMPRESSION: Mildly  displaced fracture through the head/neck of the left mandible. No additional fracture is seen. Electronically Signed   By: Wiliam Ke M.D.   On: 02/16/2021 19:29    Assessment/Plan: Patient presents with altered mental status and incidental finding of left mandibular condyle fracture.  I suspect that this is a result of a fall  that occurred prior to his most recent episode of altered mental status.  It sounds like during those previous falls he lost some teeth and had a chin laceration which would be more consistent with a type of trauma that would cause the condyle fracture.  Given the time lag I do not believe that a closed reduction would be possible.  Given the patient is totally functional and seems to be eating and arranging his job at baseline would not recommend surgical intervention for this fracture.  I had a long discussion with his father at bedside discussing the details of the operative course and we both agree that that would be particularly difficult for this patient and unlikely to give him much in the way of benefit.  We will plan to offer them my office information should they want to discuss this any further.  We will sign off.  Call with any questions.  Allena Napoleon 02/17/2021, 1:27 PM

## 2021-02-18 LAB — COMPREHENSIVE METABOLIC PANEL
ALT: 51 U/L — ABNORMAL HIGH (ref 0–44)
AST: 56 U/L — ABNORMAL HIGH (ref 15–41)
Albumin: 2.7 g/dL — ABNORMAL LOW (ref 3.5–5.0)
Alkaline Phosphatase: 162 U/L — ABNORMAL HIGH (ref 38–126)
Anion gap: 4 — ABNORMAL LOW (ref 5–15)
BUN: 9 mg/dL (ref 6–20)
CO2: 28 mmol/L (ref 22–32)
Calcium: 8.5 mg/dL — ABNORMAL LOW (ref 8.9–10.3)
Chloride: 107 mmol/L (ref 98–111)
Creatinine, Ser: 0.66 mg/dL (ref 0.61–1.24)
GFR, Estimated: >60 mL/min (ref >60)
Glucose, Bld: 105 mg/dL — ABNORMAL HIGH (ref 70–99)
Potassium: 3.9 mmol/L (ref 3.5–5.1)
Sodium: 139 mmol/L (ref 135–145)
Total Bilirubin: 0.6 mg/dL (ref 0.3–1.2)
Total Protein: 5.6 g/dL — ABNORMAL LOW (ref 6.5–8.1)

## 2021-02-18 LAB — CBC
HCT: 32.1 % — ABNORMAL LOW (ref 39.0–52.0)
Hemoglobin: 10.9 g/dL — ABNORMAL LOW (ref 13.0–17.0)
MCH: 34.5 pg — ABNORMAL HIGH (ref 26.0–34.0)
MCHC: 34 g/dL (ref 30.0–36.0)
MCV: 101.6 fL — ABNORMAL HIGH (ref 80.0–100.0)
Platelets: 218 10*3/uL (ref 150–400)
RBC: 3.16 MIL/uL — ABNORMAL LOW (ref 4.22–5.81)
RDW: 13.7 % (ref 11.5–15.5)
WBC: 4.3 10*3/uL (ref 4.0–10.5)
nRBC: 0 % (ref 0.0–0.2)

## 2021-02-18 LAB — GLUCOSE, CAPILLARY
Glucose-Capillary: 121 mg/dL — ABNORMAL HIGH (ref 70–99)
Glucose-Capillary: 88 mg/dL (ref 70–99)
Glucose-Capillary: 92 mg/dL (ref 70–99)
Glucose-Capillary: 94 mg/dL (ref 70–99)

## 2021-02-18 LAB — RESP PANEL BY RT-PCR (FLU A&B, COVID) ARPGX2
Influenza A by PCR: NEGATIVE
Influenza B by PCR: NEGATIVE
SARS Coronavirus 2 by RT PCR: NEGATIVE

## 2021-02-18 LAB — LEVETIRACETAM LEVEL: Levetiracetam Lvl: 6.5 ug/mL — ABNORMAL LOW (ref 10.0–40.0)

## 2021-02-18 MED ORDER — LEVETIRACETAM 250 MG PO TABS: 500.0000 mg | ORAL_TABLET | Freq: Two times a day (BID) | ORAL | 0 refills | Status: DC

## 2021-02-18 MED ORDER — LEVETIRACETAM 500 MG PO TABS: 500.0000 mg | ORAL_TABLET | Freq: Two times a day (BID) | ORAL | Status: DC

## 2021-02-18 NOTE — Discharge Summary (Addendum)
Physician Discharge Summary  CANIO WINOKUR QBV:[id number] DOB: 05/04/1966 DOA: 02/15/2021  PCP: Lucretia Field  Admit date: 02/15/2021 Discharge date: 02/18/2021  Admitted From: Group home  Disposition:  Group Home.   Recommendations for Outpatient Follow-up:  Follow up with PCP in 1-2 weeks Please obtain BMP/CBC in one week Please follow up with neurology in 4 weeks.   Discharge Condition:stable. CODE STATUS:full code.  Diet recommendation: Heart Healthy / dysphagia 1 Diet with thin liquids  Brief/Interim Summary:  55 year old male with history of Down syndrome, apparently nonverbal at baseline, recurrent falls, seizures, hypothyroidism, hypertension comes into the hospital with altered mental status.  He was in his normal state of health up until the morning of admission, during breakfast he was found unresponsive.  He was splashed with cold water, was reacting but was not himself.  He was incontinent of urine, no seizure-like activity noted.  He was brought to the ED where he was found to be hypothermic as low as 94.7 rectal and hypotensive with a blood pressure in the 70s on admission.  He was admitted to the hospital and empirically started on broad-spectrum antibiotics.  COVID was negative and blood and urine cultures were obtained on admission.  Case was discussed with neurology who recommended getting a Keppra level as well as obtaining a routine EEG.  CT C-spine on admission showed acute mildly displaced fracture of the left mandibular condyle. Discharge Diagnoses:  Active Problems:   SIRS (systemic inflammatory response syndrome) (HCC)  Acute metabolic encephalopathy/syncope/seizure-unclear etiology right now,?  Vasovagal.  There is concern for seizure given history of seizures as well as urinary incontinence, EEG negative for epileptiform discharges -Continue Vimpat and Keppra, Keppra levels pending. Keppra dose increased to 500 mg BID.  -Urinalysis with rare bacteria but  no WBC or nitrites.  COVID was negative.  Chest x-ray with mild opacity in the right upper lobe favored to represent atelectasis.  He has no respiratory symptoms. He was started on antibiotics but infection seems less likely given normal WBC and normal state of health just prior to this acute event. Procalcitonin less than 0.1 and lactic acid was unremarkable.  His cultures are negative today at 72 hours and  we have discontinued his antibiotics. -TSH and cortisol normal -B12 borderline low but still within normal limits, was given one dose of IV B12 injection.  -Folate normal -He appears back to baseline per father, alert, no longer hypothermic. since he remains stable off antibiotics without further issues anticipate he could be discharged to his group home today.     Recurrent falls/history of syncope-patient is seen by cardiology as an outpatient for recurrent syncope and now being under observation.  2D echo fairly unremarkable   Hypothyroidism-continue home Synthroid  Essential hypertension-hold home regimen due to hypotension on admission  Macrocytic anemia-give B12  Mandibular fracture-CT maxillofacial obtained yesterday showed mildly displaced fracture through the head/neck of the left mandible.  It is not clear how this fracture happened, he has no overlying ecchymosis to suggest injury, no reported tonic-clonic seizures either.  He did have dental work recently so not sure if he could potentially be related to that.  Discussed with Dr. Steffanie Dunn with plastics,  who evaluated the patient, and recommended no surgical or operative course given pt is totally functional , eating and back to baseline.   Discharge Instructions   Allergies as of 02/18/2021       Reactions   Clonidine Derivatives Other (See Comments)   Causes symptomatic bradycardia  with syncope and hypotension        Medication List     STOP taking these medications    doxycycline 50 MG capsule Commonly known as:  VIBRAMYCIN       TAKE these medications    acetaminophen 650 MG CR tablet Commonly known as: TYLENOL Take 650 mg by mouth 3 (three) times daily.   benazepril 10 MG tablet Commonly known as: LOTENSIN Take 10 mg by mouth at bedtime.   calcitonin (salmon) 200 UNIT/ACT nasal spray Commonly known as: MIACALCIN/FORTICAL Place 1 spray into alternate nostrils daily.   ciclopirox 8 % solution Commonly known as: PENLAC Apply 1 application topically See admin instructions. Apply topically to toenails every Wednesday evening   Fish Oil 500 MG Caps Take 1,500 mg by mouth every morning.   Johnsons Baby Shampoo Sham Apply 1 application topically See admin instructions. Mix 1/2 water and 1/2 shampoo - use to scrub eyebrows and eyelids every morning   Lacosamide 150 MG Tabs Commonly known as: Vimpat Take 1 tablet (150 mg total) by mouth 2 (two) times daily.   LACRI-LUBE OP Place 1 application into both eyes at bedtime.   lactulose 10 GM/15ML solution Commonly known as: CHRONULAC Take 10 g by mouth every morning.   levETIRAcetam 250 MG tablet Commonly known as: KEPPRA Take 2 tablets (500 mg total) by mouth 2 (two) times daily. What changed: how much to take   levothyroxine 88 MCG tablet Commonly known as: SYNTHROID Take 88 mcg by mouth every morning.   PHISODERM EX Apply 1 application topically 2 (two) times daily. Apply to face   pyridOXINE 100 MG tablet Commonly known as: VITAMIN B-6 Take 100 mg by mouth every morning.   tolnaftate 1 % spray Commonly known as: TINACTIN Apply 1 application topically See admin instructions. Spray topically between toes daily at bedtime   VISINE OP Place 1 drop into both eyes 3 (three) times daily.   Vitamin D3 50 MCG (2000 UT) Tabs Take 2,000 Units by mouth every morning.        Allergies  Allergen Reactions   Clonidine Derivatives Other (See Comments)    Causes symptomatic bradycardia with syncope and hypotension     Consultations: Plastic surgery.    Procedures/Studies: DG Chest 2 View  Result Date: 02/15/2021 CLINICAL DATA:  Syncope. EXAM: CHEST - 2 VIEW COMPARISON:  February 23, 2018 FINDINGS: The cardiomediastinal silhouette is stable. No pneumothorax. Mild opacity in the right upper lobe, somewhat platelike. No other infiltrates. No nodules or masses. IMPRESSION: Mild opacity in the right upper lobe, somewhat platelike, favored to represent atelectasis. No other acute abnormalities. Electronically Signed   By: Gerome Sam III M.D.   On: 02/15/2021 11:14   CT HEAD WO CONTRAST  Result Date: 02/15/2021 CLINICAL DATA:  Seizure EXAM: CT HEAD WITHOUT CONTRAST TECHNIQUE: Contiguous axial images were obtained from the base of the skull through the vertex without intravenous contrast. COMPARISON:  CT head 12/11/2020 FINDINGS: Brain: There is no evidence of acute intracranial hemorrhage, extra-axial fluid collection, or infarct. There is mild parenchymal volume loss with enlargement of the ventricular system, advanced for age. There is no significant burden of chronic white matter microangiopathy. There is no mass lesion. There is no midline shift. Vascular: There is calcification of the bilateral cavernous ICAs. Skull: Normal. Negative for fracture or focal lesion. Sinuses/Orbits: There is minimal mucosal thickening in the left maxillary sinus. The globes and orbits are unremarkable. Other: There is prominent calcification in the right  nasopharynx, unchanged since 2011. IMPRESSION: 1. No acute intracranial pathology or epileptogenic focus identified. 2. Parenchymal volume loss with enlargement of the ventricular system, slightly advanced for age. Electronically Signed   By: Lesia Hausen M.D.   On: 02/15/2021 11:13   CT CERVICAL SPINE WO CONTRAST  Result Date: 02/15/2021 CLINICAL DATA:  Unwitnessed seizure, found unconscious EXAM: CT CERVICAL SPINE WITHOUT CONTRAST TECHNIQUE: Multidetector CT imaging of the  cervical spine was performed without intravenous contrast. Multiplanar CT image reconstructions were also generated. COMPARISON:  CT cervical spine 12/11/2020 FINDINGS: Alignment: There is straightening of the cervical spine lordosis with mild focal kyphosis centered at C3, unchanged. There is trace retrolisthesis of C3 on C4, also unchanged. There is no evidence of traumatic malalignment. There is no jumped or perched facet. Skull base and vertebrae: Skull base alignment is normal. Vertebral body heights are preserved. There is no evidence of acute vertebral body fracture. Soft tissues and spinal canal: No prevertebral fluid or swelling. No visible canal hematoma. Disc levels: There is marked multilevel intervertebral disc space narrowing, most advanced at C3-C4. There is associated multilevel uncovertebral and facet arthropathy resulting in mild spinal canal stenosis at C3-C4 and severe bilateral neural foraminal stenosis and numerous levels. Upper chest: The imaged portions of the lung apices are clear. Other: There is an acute mildly displaced fracture of the left mandibular condyle. Calcification in the right nasopharynx is unchanged going back to 2011 IMPRESSION: 1. No acute fracture or traumatic malalignment of the cervical spine. 2. Acute mildly displaced fracture of the left mandibular condyle. 3. Multilevel degenerative changes as above. Electronically Signed   By: Lesia Hausen M.D.   On: 02/15/2021 11:26   EEG adult  Result Date: 02/16/2021 Charlsie Quest, MD     02/16/2021  2:31 PM Patient Name: [name] MRN: [id number] Epilepsy Attending: Charlsie Quest Referring Physician/Provider: Haskel Schroeder, PA-C Date: 02/16/2021 Duration: 22.31 mins Patient history: 55yo m with h/o epilepsy, now with ams. EEG to evaluate for seizure. Level of alertness: Awake AEDs during EEG study: LEV, LCM Technical aspects: This EEG study was done with scalp electrodes positioned according to the 10-20  International system of electrode placement. Electrical activity was acquired at a sampling rate of  and reviewed with a high frequency filter of  and a low frequency filter of . EEG data were recorded continuously and digitally stored. Description:  No posterior dominant rhythm was seen. EEG showed continuous generalized 3 to 6 Hz theta-delta slowing. Generalized, maximal bifrontal spike and wave complex were noted. Hyperventilation and photic stimulation were not performed.   Of note, this study was technically difficult due to significant movement artifact. ABNORMALITY -Spike and wave,generalized - Continuous slow, generalized IMPRESSION: This technically difficult study is consistent with patient's known history of epilepsy with generalized onset.There is also moderate diffuse encephalopathy, non etiology. No seizures were seen throughout the recording. Charlsie Quest   ECHOCARDIOGRAM COMPLETE  Result Date: 02/16/2021    ECHOCARDIOGRAM REPORT   Patient Name:   ZANDON TALTON Date of Exam: 02/16/2021 Medical Rec #:  [id number]            Height:       62.0 in Accession #:    [id number]           Weight:       168.4 lb Date of Birth:  1965/11/04            BSA:  1.777 m Patient Age:    55 years             BP:           145/84 mmHg Patient Gender: M                    HR:           95 bpm. Exam Location:  Inpatient Procedure: 2D Echo, Color Doppler and Cardiac Doppler Indications:    R55 Syncope  History:        Patient has prior history of Echocardiogram examinations, most                 recent 02/24/2018. Risk Factors:Hypertension and Down's Syndrome.  Sonographer:    Eulah Pont RDCS Referring Phys: [phone] Daylene Katayama [facility]  Sonographer Comments: No subcostal window. IMPRESSIONS  1. Left ventricular ejection fraction, by estimation, is >75%. The left ventricle has hyperdynamic function. The left ventricle has no regional wall motion abnormalities. Left ventricular diastolic  parameters were normal. Elevated left atrial pressure.  2. Right ventricular systolic function is normal. The right ventricular size is normal. There is normal pulmonary artery systolic pressure.  3. The mitral valve is grossly normal. Trivial mitral valve regurgitation.  4. The aortic valve is tricuspid. Aortic valve regurgitation is not visualized. Mild aortic valve sclerosis is present, with no evidence of aortic valve stenosis. FINDINGS  Left Ventricle: Left ventricular ejection fraction, by estimation, is >75%. The left ventricle has hyperdynamic function. The left ventricle has no regional wall motion abnormalities. The left ventricular internal cavity size was small. There is no left  ventricular hypertrophy. Left ventricular diastolic parameters were normal. Elevated left atrial pressure. Right Ventricle: The right ventricular size is normal. Right vetricular wall thickness was not assessed. Right ventricular systolic function is normal. There is normal pulmonary artery systolic pressure. The tricuspid regurgitant velocity is 2.20 m/s, and with an assumed right atrial pressure of 3 mmHg, the estimated right ventricular systolic pressure is 22.4 mmHg. Left Atrium: Left atrial size was normal in size. Right Atrium: Right atrial size was normal in size. Pericardium: There is no evidence of pericardial effusion. Mitral Valve: The mitral valve is grossly normal. Trivial mitral valve regurgitation. Tricuspid Valve: The tricuspid valve is normal in structure. Tricuspid valve regurgitation is mild. Aortic Valve: The aortic valve is tricuspid. Aortic valve regurgitation is not visualized. Mild aortic valve sclerosis is present, with no evidence of aortic valve stenosis. Pulmonic Valve: The pulmonic valve was not well visualized. Pulmonic valve regurgitation is not visualized. Aorta: The aortic root is normal in size and structure. IAS/Shunts: No atrial level shunt detected by color flow Doppler.  LEFT VENTRICLE PLAX  2D LVIDd:         2.70 cm  Diastology LVIDs:         1.50 cm  LV e' medial:    8.27 cm/s LV PW:         1.00 cm  LV E/e' medial:  17.3 LV IVS:        0.90 cm  LV e' lateral:   7.29 cm/s LVOT diam:     2.30 cm  LV E/e' lateral: 19.6 LV SV:         67 LV SV Index:   38 LVOT Area:     4.15 cm  RIGHT VENTRICLE RV S prime:     19.30 cm/s TAPSE (M-mode): 3.0 cm LEFT ATRIUM  Index       RIGHT ATRIUM          Index LA diam:        2.60 cm 1.46 cm/m  RA Area:     7.33 cm LA Vol (A2C):   21.7 ml 12.21 ml/m RA Volume:   15.10 ml 8.50 ml/m LA Vol (A4C):   25.9 ml 14.57 ml/m LA Biplane Vol: 24.7 ml 13.90 ml/m  AORTIC VALVE LVOT Vmax:   78.40 cm/s LVOT Vmean:  61.700 cm/s LVOT VTI:    0.161 m  AORTA Ao Root diam: 3.30 cm Ao Asc diam:  3.20 cm MITRAL VALVE                TRICUSPID VALVE MV Area (PHT): 5.02 cm     TR Peak grad:   19.4 mmHg MV Decel Time: 151 msec     TR Vmax:        220.00 cm/s MV E velocity: 143.00 cm/s MV A velocity: 126.00 cm/s  SHUNTS MV E/A ratio:  1.13         Systemic VTI:  0.16 m                             Systemic Diam: 2.30 cm Dietrich Pates MD Electronically signed by Dietrich Pates MD Signature Date/Time: 02/16/2021/4:58:13 PM    Final    CT MAXILLOFACIAL WO CONTRAST  Result Date: 02/16/2021 CLINICAL DATA:  Dental facial anomaly EXAM: CT MAXILLOFACIAL WITHOUT CONTRAST TECHNIQUE: Multidetector CT imaging of the maxillofacial structures was performed. Multiplanar CT image reconstructions were also generated. COMPARISON:  CT head 02/16/2019. FINDINGS: Osseous: Mildly displaced fracture through the head/neck of the left mandible (series 4, images 48-50 and series 7, image 58). No additional fracture is seen in the mandible. No other acute facial bone fracture. Orbits: Negative. No traumatic or inflammatory finding. Sinuses: Minimal mucosal thickening.  Otherwise clear. Soft tissues: Negative. Limited intracranial: No significant or unexpected finding. IMPRESSION: Mildly displaced fracture  through the head/neck of the left mandible. No additional fracture is seen. Electronically Signed   By: Wiliam Ke M.D.   On: 02/16/2021 19:29      Subjective: No new complaints, mother at bedside.   Discharge Exam: Vitals:   02/17/21 2241 02/18/21 0556  BP: 119/61 (!) 155/78  Pulse: 67 72  Resp: 18 17  Temp:  98 F (36.7 C)  SpO2: 99% 100%   Vitals:   02/17/21 1357 02/17/21 1822 02/17/21 2241 02/18/21 0556  BP: (!) 165/85 (!) 120/51 119/61 (!) 155/78  Pulse: 83 77 67 72  Resp: Temp: 97.9 F (36.6 C) 98.1 F (36.7 C) 98.8 F (37.1 C) 98 F (36.7 C)  TempSrc: Oral Oral Oral Oral  SpO2: 100% 96% 99% 100%  Weight:      Height:        General: Pt is alert, awake, not in acute distress Cardiovascular: RRR, S1/S2 +, no rubs, no gallops Respiratory: CTA bilaterally, no wheezing, no rhonchi Abdominal: Soft, NT, ND, bowel sounds + Extremities: no edema, no cyanosis    The results of significant diagnostics from this hospitalization (including imaging, microbiology, ancillary and laboratory) are listed below for reference.     Microbiology: Recent Results (from the past 240 hour(s))  Blood culture (routine x 2)     Status: None (Preliminary result)   Collection Time: 02/15/21 11:22 AM   Specimen: BLOOD  Result Value  Ref Range Status   Specimen Description   Final    BLOOD RIGHT ANTECUBITAL Performed at [facility], [address] [place], [place] [address]    Special Requests   Final    BOTTLES DRAWN AEROBIC AND ANAEROBIC Blood Culture results may not be optimal due to an excessive volume of blood received in culture bottles Performed at [facility], [address] [place], [place] [address]    Culture   Final    NO GROWTH 2 DAYS Performed at [facility], [address], [place], [place] [address]    Report Status PENDING  Incomplete  Blood culture (routine x 2)     Status: None (Preliminary result)    Collection Time: 02/15/21 11:30 AM   Specimen: BLOOD  Result Value Ref Range Status   Specimen Description   Final    BLOOD BLOOD RIGHT FOREARM Performed at [facility], [address] [place], [place] [address]    Special Requests   Final    BOTTLES DRAWN AEROBIC AND ANAEROBIC Blood Culture results may not be optimal due to an excessive volume of blood received in culture bottles Performed at [facility], [address] [place], [place] [address]    Culture   Final    NO GROWTH 2 DAYS Performed at [facility], [address], [place], [place] [address]    Report Status PENDING  Incomplete  Resp Panel by RT-PCR (Flu A&B, Covid) Nasopharyngeal Swab     Status: None   Collection Time: 02/15/21 11:47 AM   Specimen: Nasopharyngeal Swab; Nasopharyngeal(NP) swabs in vial transport medium  Result Value Ref Range Status   SARS Coronavirus 2 by RT PCR NEGATIVE NEGATIVE Final    Comment: (NOTE) SARS-CoV-2 target nucleic acids are NOT DETECTED.  The SARS-CoV-2 RNA is generally detectable in upper respiratory specimens during the acute phase of infection. The lowest concentration of SARS-CoV-2 viral copies this assay can detect is 138 copies/mL. A negative result does not preclude SARS-Cov-2 infection and should not be used as the sole basis for treatment or other patient management decisions. A negative result may occur with  improper specimen collection/handling, submission of specimen other than nasopharyngeal swab, presence of viral mutation(s) within the areas targeted by this assay, and inadequate number of viral copies(<138 copies/mL). A negative result must be combined with clinical observations, patient history, and epidemiological information. The expected result is Negative.  Fact Sheet for Patients:  BloggerCourse.com  Fact Sheet for Healthcare Providers:   SeriousBroker.it  This test is no t yet approved or cleared by the [place] FDA and  has been authorized for detection and/or diagnosis of SARS-CoV-2 by FDA under an Emergency Use Authorization (EUA). This EUA will remain  in effect (meaning this test can be used) for the duration of the COVID-19 declaration under Section 564(b)(1) of the Act, 21 U.S.C.section 360bbb-3(b)(1), unless the authorization is terminated  or revoked sooner.       Influenza A by PCR NEGATIVE NEGATIVE Final   Influenza B by PCR NEGATIVE NEGATIVE Final    Comment: (NOTE) The Xpert Xpress SARS-CoV-2/FLU/RSV plus assay is intended as an aid in the diagnosis of influenza from Nasopharyngeal swab specimens and should not be used as a sole basis for treatment. Nasal washings and aspirates are unacceptable for Xpert Xpress SARS-CoV-2/FLU/RSV testing.  Fact Sheet for Patients: BloggerCourse.com  Fact Sheet for Healthcare Providers: SeriousBroker.it  This test is not yet  approved or cleared by the [place] and has been authorized for detection and/or diagnosis of SARS-CoV-2 by FDA under an Emergency Use Authorization (EUA). This EUA will remain in effect (meaning this test can be used) for the duration of the COVID-19 declaration under Section 564(b)(1) of the Act, 21 U.S.C. section 360bbb-3(b)(1), unless the authorization is terminated or revoked.  Performed at [facility], [address], [place], [place] [address]   Urine Culture     Status: None   Collection Time: 02/15/21  5:54 PM   Specimen: In/Out Cath Urine  Result Value Ref Range Status   Specimen Description   Final    IN/OUT CATH URINE Performed at [facility], [address], [place], [place] [address]    Special Requests   Final    NONE Performed at [facility], [address]  [place], [place] [address]    Culture   Final    NO GROWTH Performed at [facility], [address], [place], [place] [address]    Report Status 02/16/2021 FINAL  Final  MRSA Next Gen by PCR, Nasal     Status: None   Collection Time: 02/15/21  9:33 PM   Specimen: Nasal Mucosa; Nasal Swab  Result Value Ref Range Status   MRSA by PCR Next Gen NOT DETECTED NOT DETECTED Final    Comment: (NOTE) The GeneXpert MRSA Assay (FDA approved for NASAL specimens only), is one component of a comprehensive MRSA colonization surveillance program. It is not intended to diagnose MRSA infection nor to guide or monitor treatment for MRSA infections. Test performance is not FDA approved in patients less than 56 years old. Performed at [facility], [address], [place], [place] [address]      Labs: BNP (last 3 results) No results for input(s): BNP in the last 8760 hours. Basic Metabolic Panel: Recent Labs  Lab 02/15/21 1021 02/15/21 1936 02/16/21 0747 02/18/21 0747  NA 139  --  138 139  K 4.1  --  4.2 3.9  CL 108  --  106 107  CO2 25  --  25 28  GLUCOSE 108*  --  90 105*  BUN 15  --  12 9  CREATININE 0.87 0.60* 0.58* 0.66  CALCIUM 8.8*  --  8.4* 8.5*   Liver Function Tests: Recent Labs  Lab 02/15/21 1021 02/16/21 0747 02/18/21 0747  AST 44* 44* 56*  ALT 41 41 51*  ALKPHOS 167* 160* 162*  BILITOT 0.5 0.8 0.6  PROT 6.0* 5.7* 5.6*  ALBUMIN 2.9* 2.8* 2.7*   No results for input(s): LIPASE, AMYLASE in the last 168 hours. No results for input(s): AMMONIA in the last 168 hours. CBC: Recent Labs  Lab 02/15/21 1021 02/15/21 1936 02/16/21 0833 02/18/21 0747  WBC 4.3 4.1 3.7* 4.3  NEUTROABS 1.9  --   --   --   HGB 12.0* 10.9* 11.6* 10.9*  HCT 36.3* 32.7* 34.7* 32.1*  MCV 103.7* 103.8* 102.1* 101.6*  PLT 252 226 227 218   Cardiac Enzymes: No results for input(s): CKTOTAL, CKMB, CKMBINDEX, TROPONINI in the last 168 hours. BNP: Invalid input(s):  POCBNP CBG: Recent Labs  Lab 02/17/21 1159 02/17/21 2003 02/18/21 0033 02/18/21 0558 02/18/21 0756  GLUCAP 139* 124* 92 88 94   D-Dimer No results for input(s): DDIMER in the last 72 hours. Hgb A1c No results for input(s): HGBA1C in the last 72 hours. Lipid Profile No results for  input(s): CHOL, HDL, LDLCALC, TRIG, CHOLHDL, LDLDIRECT in the last 72 hours. Thyroid function studies No results for input(s): TSH, T4TOTAL, T3FREE, THYROIDAB in the last 72 hours.  Invalid input(s): FREET3 Anemia work up Recent Labs    02/15/21 1936  VITAMINB12 297  FOLATE 10.7   Urinalysis    Component Value Date/Time   COLORURINE YELLOW (A) 02/15/2021 1754   APPEARANCEUR CLEAR (A) 02/15/2021 1754   LABSPEC 1.010 02/15/2021 1754   PHURINE 6.5 02/15/2021 1754   GLUCOSEU NEGATIVE 02/15/2021 1754   HGBUR NEGATIVE 02/15/2021 1754   BILIRUBINUR NEGATIVE 02/15/2021 1754   KETONESUR NEGATIVE 02/15/2021 1754   PROTEINUR NEGATIVE 02/15/2021 1754   NITRITE NEGATIVE 02/15/2021 1754   LEUKOCYTESUR NEGATIVE 02/15/2021 1754   [facility] Invalid input(s): PROCALCITONIN,  WBC,  LACTICIDVEN Microbiology Recent Results (from the past 240 hour(s))  Blood culture (routine x 2)     Status: None (Preliminary result)   Collection Time: 02/15/21 11:22 AM   Specimen: BLOOD  Result Value Ref Range Status   Specimen Description   Final    BLOOD RIGHT ANTECUBITAL Performed at [facility], [address], [place], [place] [address]    Special Requests   Final    BOTTLES DRAWN AEROBIC AND ANAEROBIC Blood Culture results may not be optimal due to an excessive volume of blood received in culture bottles Performed at [facility], [address], [place], [place] [address]    Culture   Final    NO GROWTH 2 DAYS Performed at [facility], [address] [place], [place] [address]    Report Status PENDING  Incomplete  Blood culture (routine x 2)     Status: None  (Preliminary result)   Collection Time: 02/15/21 11:30 AM   Specimen: BLOOD  Result Value Ref Range Status   Specimen Description   Final    BLOOD BLOOD RIGHT FOREARM Performed at [facility], [address], [place], [place] [address]    Special Requests   Final    BOTTLES DRAWN AEROBIC AND ANAEROBIC Blood Culture results may not be optimal due to an excessive volume of blood received in culture bottles Performed at [facility], [address], [place], [place] [address]    Culture   Final    NO GROWTH 2 DAYS Performed at [facility], [address] [place], [place] [address]    Report Status PENDING  Incomplete  Resp Panel by RT-PCR (Flu A&B, Covid) Nasopharyngeal Swab     Status: None   Collection Time: 02/15/21 11:47 AM   Specimen: Nasopharyngeal Swab; Nasopharyngeal(NP) swabs in vial transport medium  Result Value Ref Range Status   SARS Coronavirus 2 by RT PCR NEGATIVE NEGATIVE Final    Comment: (NOTE) SARS-CoV-2 target nucleic acids are NOT DETECTED.  The SARS-CoV-2 RNA is generally detectable in upper respiratory specimens during the acute phase of infection. The lowest concentration of SARS-CoV-2 viral copies this assay can detect is 138 copies/mL. A negative result does not preclude SARS-Cov-2 infection and should not be used as the sole basis for treatment or other patient management decisions. A negative result may occur with  improper specimen collection/handling, submission of specimen other than nasopharyngeal swab, presence of viral mutation(s) within the areas targeted by this assay, and inadequate number of viral copies(<138 copies/mL). A negative result must be combined with clinical observations, patient history, and epidemiological information. The expected result is Negative.  Fact Sheet for  Patients:  BloggerCourse.com  Fact Sheet for Healthcare Providers:   SeriousBroker.it  This test is no t yet approved or cleared by the [place] FDA and  has been authorized for detection and/or diagnosis of SARS-CoV-2 by FDA under an Emergency Use Authorization (EUA). This EUA will remain  in effect (meaning this test can be used) for the duration of the COVID-19 declaration under Section 564(b)(1) of the Act, 21 U.S.C.section 360bbb-3(b)(1), unless the authorization is terminated  or revoked sooner.       Influenza A by PCR NEGATIVE NEGATIVE Final   Influenza B by PCR NEGATIVE NEGATIVE Final    Comment: (NOTE) The Xpert Xpress SARS-CoV-2/FLU/RSV plus assay is intended as an aid in the diagnosis of influenza from Nasopharyngeal swab specimens and should not be used as a sole basis for treatment. Nasal washings and aspirates are unacceptable for Xpert Xpress SARS-CoV-2/FLU/RSV testing.  Fact Sheet for Patients: BloggerCourse.com  Fact Sheet for Healthcare Providers: SeriousBroker.it  This test is not yet approved or cleared by the [place] FDA and has been authorized for detection and/or diagnosis of SARS-CoV-2 by FDA under an Emergency Use Authorization (EUA). This EUA will remain in effect (meaning this test can be used) for the duration of the COVID-19 declaration under Section 564(b)(1) of the Act, 21 U.S.C. section 360bbb-3(b)(1), unless the authorization is terminated or revoked.  Performed at [facility], [address] [place], [place] [address]   Urine Culture     Status: None   Collection Time: 02/15/21  5:54 PM   Specimen: In/Out Cath Urine  Result Value Ref Range Status   Specimen Description   Final    IN/OUT CATH URINE Performed at [facility], [address] [place], [place] [address]    Special Requests   Final    NONE Performed at [facility], [address]  [place], [place] [address]    Culture   Final    NO GROWTH Performed at [facility], [address] [place], [place] [address]    Report Status 02/16/2021 FINAL  Final  MRSA Next Gen by PCR, Nasal     Status: None   Collection Time: 02/15/21  9:33 PM   Specimen: Nasal Mucosa; Nasal Swab  Result Value Ref Range Status   MRSA by PCR Next Gen NOT DETECTED NOT DETECTED Final    Comment: (NOTE) The GeneXpert MRSA Assay (FDA approved for NASAL specimens only), is one component of a comprehensive MRSA colonization surveillance program. It is not intended to diagnose MRSA infection nor to guide or monitor treatment for MRSA infections. Test performance is not FDA approved in patients less than 41 years old. Performed at [facility], [address], [place], [place] [address]      Time coordinating discharge: 38 minutes.   SIGNED:   Kathlen Mody, MD  [facility]

## 2021-02-18 NOTE — TOC Transition Note (Signed)
Transition of Care [facility]) - CM/SW Discharge Note   Patient Details  Name: [name] MRN: [id number] Date of Birth: 12-Dec-1965  Transition of Care [facility]) CM/SW Contact:  Bartholome Bill, RN Phone Number: 02/18/2021, 2:10 PM   Clinical Narrative:    Pt to dc back to [facility] group home today. Melissa White [phone]) from the group home contacted for dc planning. DC summary faxed to [facility] (fax [phone]). Covid test was ordered by MD and was negative. Group home staff to come pick pt up at 4pm. Pt mom made aware.   Final next level of care: Group Home Barriers to Discharge: No Barriers Identified   Patient Goals and CMS Choice Patient states their goals for this hospitalization and ongoing recovery are:: uanable to state due to condition CMS Medicare.gov Compare Post Acute Care list provided to:: Legal Guardian    Discharge Plan and Services   Discharge Planning Services: CM Consult               Readmission Risk Interventions Readmission Risk Prevention Plan 02/18/2021  Transportation Screening Complete  PCP or Specialist Appt within 5-7 Days Complete  Home Care Screening Complete  Medication Review (RN CM) Complete  Some recent data might be hidden

## 2021-02-18 NOTE — Progress Notes (Signed)
PHARMACIST - PHYSICIAN COMMUNICATION   CONCERNING: IV to Oral Route Change Policy  RECOMMENDATION: This patient is receiving keppra by the intravenous route.  Based on criteria approved by the Pharmacy and Therapeutics Committee, the intravenous medication(s) is/are being converted to the equivalent oral dose form(s).   DESCRIPTION: These criteria include: The patient is eating (either orally or via tube) and/or has been taking other orally administered medications for a least 24 hours The patient has no evidence of active gastrointestinal bleeding or impaired GI absorption (gastrectomy, short bowel, patient on TNA or NPO).  If you have questions about this conversion, please contact the Pharmacy Department  []   [phone] )  ( [phone] []   [phone] )  [facility] []   [phone] )  [facility] []   [phone] )  [facility] [x]   [phone] )  [facility]   ( [phone], [facility] 02/18/2021 8:52 AM

## 2021-02-18 NOTE — Progress Notes (Signed)
Report given to Hans Eden and nurse from Group Home.  Discussed that patient will be on a dysphagia I diet r/t mandible fracture.  Group home to pick up pt at 1600 today. Father at [facility].

## 2021-02-23 LAB — CULTURE, BLOOD (ROUTINE X 2)
Culture: NO GROWTH
Culture: NO GROWTH

## 2021-03-23 ENCOUNTER — Emergency Department (HOSPITAL_BASED_OUTPATIENT_CLINIC_OR_DEPARTMENT_OTHER)
Admission: EM | Admit: 2021-03-23 | Discharge: 2021-03-23 | Disposition: A | Discharge status: Home / Self Care | Payer: Medicare Other | Attending: Emergency Medicine | Admitting: Emergency Medicine

## 2021-03-23 ENCOUNTER — Encounter (HOSPITAL_BASED_OUTPATIENT_CLINIC_OR_DEPARTMENT_OTHER): Payer: Self-pay

## 2021-03-23 ENCOUNTER — Other Ambulatory Visit: Payer: Self-pay

## 2021-03-23 DIAGNOSIS — W01110A Fall on same level from slipping, tripping and stumbling with subsequent striking against sharp glass, initial encounter: Secondary | ICD-10-CM | POA: Insufficient documentation

## 2021-03-23 DIAGNOSIS — Z79899 Other long term (current) drug therapy: Secondary | ICD-10-CM | POA: Diagnosis not present

## 2021-03-23 DIAGNOSIS — I10 Essential (primary) hypertension: Secondary | ICD-10-CM | POA: Insufficient documentation

## 2021-03-23 DIAGNOSIS — S01412A Laceration without foreign body of left cheek and temporomandibular area, initial encounter: Secondary | ICD-10-CM | POA: Diagnosis not present

## 2021-03-23 DIAGNOSIS — S0993XA Unspecified injury of face, initial encounter: Secondary | ICD-10-CM | POA: Diagnosis present

## 2021-03-23 DIAGNOSIS — S0181XA Laceration without foreign body of other part of head, initial encounter: Secondary | ICD-10-CM

## 2021-03-23 DIAGNOSIS — Y92000 Kitchen of unspecified non-institutional (private) residence as  the place of occurrence of the external cause: Secondary | ICD-10-CM | POA: Diagnosis not present

## 2021-03-23 DIAGNOSIS — E039 Hypothyroidism, unspecified: Secondary | ICD-10-CM | POA: Diagnosis not present

## 2021-03-23 MED ORDER — LIDOCAINE-EPINEPHRINE-TETRACAINE (LET) TOPICAL GEL
3.0000 mL | Freq: Once | TOPICAL | Status: AC
  Administered 2021-03-23: 3 mL via TOPICAL
  Filled 2021-03-23: qty 3

## 2021-03-23 NOTE — ED Triage Notes (Signed)
Patient BIB [facility] from Group Home for Fall.  Patient had a Witnessed (by Haematologist) Naval architect Fall at [employer] in [place]. Patient fell and sustained a 4-5 cm Laceration beneath Left Eye with Moderate Bruising Involved.  NAD Noted during Triage. Hx of Developmental Impairment. Group Home Staff states Patient is at Baseline. No History of Blood-Thinning Medications. VSS with EMS.

## 2021-03-23 NOTE — ED Provider Notes (Signed)
[facility] EMERGENCY DEPT Provider Note   CSN: [id number] Arrival date & time: 03/23/21  2106     History Chief Complaint  Patient presents with   Fall    [name] is a 55 y.o. male w/ hx of down syndrome (nonverbal) here from his group home with concern for laceration to left eye.  They report that he had a fall today and sustained a laceration possibly from his glasses cutting into his left cheek.  He has had cuts before in his chin and his head that required suturing.  His father is present at bedside.  The patient is nonverbal and follows only simple commands.  HPI     Past Medical History:  Diagnosis Date   Down's syndrome    GERD (gastroesophageal reflux disease)    Hypertension    Hypothyroid    Lives in group home    [phone]-Roxanne-nurse   Nystagmus    Obstructive sleep apnea (adult) (pediatric)    does use a cpap at night   Seizure disorder (HCC) 10/28/2016   Seizures (HCC)     Patient Active Problem List   Diagnosis Date Noted   SIRS (systemic inflammatory response syndrome) (HCC) 02/15/2021   Bradycardia, drug induced 02/24/2018   Syncope 02/23/2018   Seizure disorder (HCC) 10/28/2016   Obstructive sleep apnea 07/05/2008   Down syndrome 07/05/2008    Past Surgical History:  Procedure Laterality Date   IRRIGATION AND DEBRIDEMENT  07/01/2014       ORIF HUMERUS FRACTURE Left 02/05/2013   Procedure: OPEN REDUCTION INTERNAL FIXATION (ORIF) MEDIAL HUMERUS CONDYLE FRACTURE;  Surgeon: Jodi Marble, MD;  Location: [facility];  Service: Orthopedics;  Laterality: Left;   Right Elbow surgery         Family History  Problem Relation Age of Onset   Dementia Maternal Grandmother    Diabetes Paternal Grandfather    Heart failure Paternal Grandfather    Hypertension Paternal Grandfather     Social History   Tobacco Use   Smoking status: Never   Smokeless tobacco: Never  Vaping  Use   Vaping Use: Never used  Substance Use Topics   Alcohol use: No   Drug use: No    Home Medications Prior to Admission medications   Medication Sig Start Date End Date Taking? Authorizing Provider  acetaminophen (TYLENOL) 650 MG CR tablet Take 650 mg by mouth 3 (three) times daily.    [provider]  Artificial Tear Ointment (LACRI-LUBE OP) Place 1 application into both eyes at bedtime.    [provider]  benazepril (LOTENSIN) 10 MG tablet Take 10 mg by mouth at bedtime.    [provider]  calcitonin, salmon, (MIACALCIN/FORTICAL) 200 UNIT/ACT nasal spray Place 1 spray into alternate nostrils daily.    [provider]  Cholecalciferol (VITAMIN D3) 50 MCG (2000 UT) TABS Take 2,000 Units by mouth every morning.    [provider]  ciclopirox (PENLAC) 8 % solution Apply 1 application topically See admin instructions. Apply topically to toenails every Wednesday evening 01/08/21   [provider]  Infant Care Products (JOHNSONS BABY SHAMPOO) SHAM Apply 1 application topically See admin instructions. Mix 1/2 water and 1/2 shampoo - use to scrub eyebrows and eyelids every morning    [provider]  Lacosamide (VIMPAT) 150 MG TABS Take 1 tablet (150 mg total) by mouth 2 (two) times daily. 03/08/18   [name], NP  lactulose (CHRONULAC) 10 GM/15ML solution Take  10 g by mouth every morning.    [provider]  levETIRAcetam (KEPPRA) 250 MG tablet Take 2 tablets (500 mg total) by mouth 2 (two) times daily. 02/18/21   Kathlen Mody, MD  levothyroxine (SYNTHROID) 88 MCG tablet Take 88 mcg by mouth every morning. 01/16/21   [provider]  Omega-3 Fatty Acids (FISH OIL) 500 MG CAPS Take 1,500 mg by mouth every morning.    [provider]  pyridOXINE (VITAMIN B-6) 100 MG tablet Take 100 mg by mouth every morning.    [provider]  Soap & Cleansers (PHISODERM EX) Apply 1 application topically 2 (two)  times daily. Apply to face    [provider]  Tetrahydrozoline HCl (VISINE OP) Place 1 drop into both eyes 3 (three) times daily.    [provider]  tolnaftate (TINACTIN) 1 % spray Apply 1 application topically See admin instructions. Spray topically between toes daily at bedtime    [provider]    Allergies    Clonidine derivatives  Review of Systems   Review of Systems  Unable to perform ROS: Patient nonverbal (level 5 caveat)   Physical Exam Updated Vital Signs BP (!) 158/87 (BP Location: Left Arm)   Pulse (!) 108   Temp 98.6 F (37 C) (Oral)   Resp 16   Ht 5\' 2"  (1.575 m)   Wt 76.4 kg   SpO2 100%   BMI 30.81 kg/m   Physical Exam Constitutional:      General: He is not in acute distress. HENT:     Head: Normocephalic.  Eyes:     Conjunctiva/sclera: Conjunctivae normal.     Pupils: Pupils are equal, round, and reactive to light.  Cardiovascular:     Rate and Rhythm: Normal rate and regular rhythm.  Pulmonary:     Effort: Pulmonary effort is normal. No respiratory distress.  Abdominal:     General: There is no distension.     Tenderness: There is no abdominal tenderness.  Skin:    General: Skin is warm and dry.     Comments: 3 cm curvilinear laceration underneath left eye  Neurological:     General: No focal deficit present.     Mental Status: He is alert. Mental status is at baseline.    ED Results / Procedures / Treatments   Labs (all labs ordered are listed, but only abnormal results are displayed) Labs Reviewed - No data to display  EKG None  Radiology No results found.  Procedures . Laceration Repair  Date/Time: 03/23/2021 11:37 PM Performed by: 03/25/2021, MD Authorized by: Terald Sleeper, MD   Consent:    Consent obtained:  Verbal   Consent given by:  Parent   Risks, benefits, and alternatives were discussed: yes     Risks discussed:  Infection, poor cosmetic result and pain Universal protocol:     Procedure explained and questions answered to patient or proxy's satisfaction: yes     Immediately prior to procedure, a time out was called: yes     Patient identity confirmed:  Arm band Anesthesia:    Anesthesia method:  Topical application   Topical anesthetic:  LET Laceration details:    Location:  Face   Face location:  L cheek   Length (cm):  3   Depth (mm):  3 Pre-procedure details:    Preparation:  Patient was prepped and draped in usual sterile fashion Exploration:    Imaging outcome: foreign body not noted  Wound exploration: wound explored through full range of motion     Contaminated: no   Treatment:    Area cleansed with:  Saline   Amount of cleaning:  Standard Skin repair:    Repair method:  Sutures   Suture size:  5-0   Suture material:  Prolene   Suture technique:  Simple interrupted   Number of sutures:  4 Approximation:    Approximation:  Close Repair type:    Repair type:  Simple Post-procedure details:    Dressing:  Non-adherent dressing   Procedure completion:  Tolerated well, no immediate complications   Medications Ordered in ED Medications  lidocaine-EPINEPHrine-tetracaine (LET) topical gel (has no administration in time range)    ED Course  I have reviewed the triage vital signs and the nursing notes.  Pertinent labs & imaging results that were available during my care of the patient were reviewed by me and considered in my medical decision making (see chart for details).  55 yo male nonverbal with Down syndrome here with fall and laceration to left cheek, below eye.  EOM intact.  Wound gently irrigated with sterile saline, and 4 sutures placed for approxiamation, along with steri strips.  Pt tolerated well.   Okay for discharge  Doubt facial fx or ICH with this low impact fall.    Final Clinical Impression(s) / ED Diagnoses Final diagnoses:  None    Rx / DC Orders ED Discharge Orders     None        Terald Sleeper,  MD 03/23/21 2338

## 2021-03-23 NOTE — Discharge Instructions (Addendum)
The facial stitches should be removed in 5 days (Oct 28th or 29th).  4 stitches were placed in the ER today.

## 2021-04-16 ENCOUNTER — Encounter: Payer: Self-pay | Admitting: Plastic Surgery

## 2021-04-16 ENCOUNTER — Ambulatory Visit (INDEPENDENT_AMBULATORY_CARE_PROVIDER_SITE_OTHER): Payer: Medicare Other | Admitting: Plastic Surgery

## 2021-04-16 ENCOUNTER — Other Ambulatory Visit: Payer: Self-pay

## 2021-04-16 DIAGNOSIS — S02622D Fracture of subcondylar process of left mandible, subsequent encounter for fracture with routine healing: Secondary | ICD-10-CM | POA: Diagnosis not present

## 2021-04-16 NOTE — Progress Notes (Signed)
   Referring Provider Lucretia Field [address] [place],  [place] [address]   CC:  Chief Complaint  Patient presents with   Follow-up      [name] is an 55 y.o. male.  HPI: Patient presents with his caregiver for evaluation of a left mandibular neck fracture.  This was found incidentally on head CT after a fall back in September.  He had had a series of falls prior to that 1 of which caused a laceration in his chin.  Patient did not appear to be having any trouble moving his jaw or eating at that time.  After speaking with his father we decided to assume that this was an old fracture and treated nonoperatively.  Patient's been doing fine with regards to his jaw.  He is still intermittently having what are described as syncope spells and will occasionally fall.  All the information comes from his caregiver as he is not able to communicate with me.  Review of Systems General: No fevers or chills  Physical Exam Vitals with BMI 03/23/2021 03/23/2021 03/23/2021  Height - 5\' 2"  -  Weight - 168 lbs 7 oz -  BMI - 30.8 -  Systolic 153 - 158  Diastolic 84 - 87  Pulse 98 - 108    General:  No acute distress,  Alert and oriented, Non-Toxic, Normal speech and affect On exam he looks to have normal opening and closing of his jaw.  He is missing some of his central upper incisors as a result of previous falls.  No clicking or obvious pain in the TMJ.  Assessment/Plan Patient presents several months out from fracture in the left mandibular subcondylar area.  He looks to be doing fine from the standpoint.  Would not recommend any further treatment in this area unless there appears to be some symptom that is related.  At that point I be happy to see him back.  We will plan to see him again on an as-needed basis.  All the caregivers questions were answered.  04/16/2021, 3:53 PM

## 2021-07-03 ENCOUNTER — Ambulatory Visit (INDEPENDENT_AMBULATORY_CARE_PROVIDER_SITE_OTHER): Payer: Medicare Other | Admitting: Neurology

## 2021-07-03 ENCOUNTER — Encounter: Payer: Self-pay | Admitting: Neurology

## 2021-07-03 VITALS — BP 110/72 | HR 54 | Ht 61.0 in | Wt 162.0 lb

## 2021-07-03 DIAGNOSIS — Q909 Down syndrome, unspecified: Secondary | ICD-10-CM | POA: Diagnosis not present

## 2021-07-03 DIAGNOSIS — G40909 Epilepsy, unspecified, not intractable, without status epilepticus: Secondary | ICD-10-CM | POA: Diagnosis not present

## 2021-07-03 DIAGNOSIS — R2681 Unsteadiness on feet: Secondary | ICD-10-CM

## 2021-07-03 DIAGNOSIS — R6889 Other general symptoms and signs: Secondary | ICD-10-CM | POA: Diagnosis not present

## 2021-07-03 NOTE — Patient Instructions (Addendum)
Taper off Keppra=Levetiracetam 250mg   Now He is taking Keppra 250mg  2 tabs twice a day Decrease to 250mg  one tab twice a day for one week,  Then stop.  Keep Vimpat 150mg  twice a day

## 2021-07-03 NOTE — Progress Notes (Signed)
Chief Complaint  Patient presents with   New Patient (Initial Visit)    RM 15. Paper referral from Katina Dung (PCP) for recent falls. Parents POA (do not have copy-going to try and get copy for our office). Pt in Group home-[facility]. Having increased falls, unable to tell us know many in last month.     PCP    Katina Dung, MD   Seizures    No seizures per staff      ASSESSMENT AND PLAN  [name] is a 56 y.o. male   Downs syndrome Epilepsy Polypharmacy treatment Worsening gait abnormality  Is worsening gait abnormality likely multifactorial, premature brain atrophy, dementia, polypharmacy all contribute,  There was no witnessed seizure, and he was put on titrating dose of polypharmacy treatment, reported behavior issue with Keppra,  Will keep current dose of Vimpat 150 mg twice a day, check the level, tapering off Keppra  Call clinic for worsening symptoms or recurrent seizure   DIAGNOSTIC DATA (LABS, IMAGING, TESTING) - I reviewed patient records, labs, notes, testing and imaging myself where available.   MEDICAL HISTORY:  [name] is a 56 year old male, seen in request by his primary care from [name] for evaluation of increased gait abnormality, he is accompanied by staff at today's visit on July 03, 2021.  I reviewed and summarized the referring note. PMHX. Down's syndrome HTN Seizure,  Patient has Down syndrome, has been In facility for many years, per caregiver who has known him since 2022, he does have some regression, was noted to have increased gait abnormality, there was no recurrent seizure reported  He was seen by our clinic previously by Dr. Anne Hahn, last visit was with Aundra Millet in October 2019, long history of seizure, previously was treated with Depakote, and Keppra, reported elevated ammonia level, Depakote was switched to Vimpat, titrating dose to 150 mg twice a day now, there was also  reported behavior issue with higher dose of Keppra,   Patient had hospital admission in September 2022 when she was found unresponsive, then became reactive but not himself, incontinence of urine, no seizure-like activity noted,  EEG by Dr. [name] on Feb 16 2021 This technically difficult study is consistent with patient's known history of epilepsy with generalized onset.There is also moderate diffuse encephalopathy, non etiology. No seizures were seen throughout the recording.  I personally reviewed CT head without contrast, no acute abnormality, parenchymal loss, enlarged ventricle, advanced for age  CT of cervical spine, no acute abnormality, multilevel degenerative changes  CT of maxillary showed mildly displaced fracture of left mandibular condyle,  Laboratory evaluations in September 2022, normal procalcitonin, INR, CMP with mild decreased calcium 8.4, elevated alkaline phosphate 160, albumin 2.8, normal B12, folic acid, urinalysis showed rare bacteria, Keppra level was 6.5,  He was diagnosed with acute metabolic encephalopathy, Keppra dose was increased from 250 mg twice a day to 500 mg twice a day, remained on Vimpat 150 mg twice a day    PHYSICAL EXAM:   Vitals:   07/03/21 1016  BP: 110/72  Pulse: (!) 54  Weight: 162 lb (73.5 kg)  Height: 5\' 1"  (1.549 m)   Not recorded     Body mass index is 30.61 kg/m.  PHYSICAL EXAMNIATION:  Gen: NAD, conversant, well nourised, well groomed                     Cardiovascular: Regular rate rhythm, no peripheral edema, warm, nontender.  Eyes: Conjunctivae clear without exudates or hemorrhage Neck: Supple, no carotid bruits. Pulmonary: Clear to auscultation bilaterally   NEUROLOGICAL EXAM:  MENTAL STATUS: Speech/cognition: He is pleasant, know his name, cooperative on examinations, but was not able to carry on conversation, typical dysmorphic features with Down syndrome   CRANIAL NERVES: CN II: Visual fields are full to  confrontation. Pupils are round equal and briskly reactive to light. CN III, IV, VI: extraocular movement are normal. No ptosis. CN V: Facial sensation is intact to light touch CN VII: Face is symmetric with normal eye closure  CN VIII: Hearing is normal to causal conversation. CN IX, X: Phonation is normal. CN XI: Head turning and shoulder shrug are intact  MOTOR: Moving 4 extremities without difficulty  REFLEXES: Reflexes are 1 and symmetric at the biceps, triceps, knees, and ankles. Plantar responses are flexor.  SENSORY: Withdraws to pain   COORDINATION: There is no trunk or limb dysmetria noted.  GAIT/STANCE: He can push up from seated position, leaning forward, wide-based, unsteady  REVIEW OF SYSTEMS:  Full 14 system review of systems performed and notable only for as above All other review of systems were negative.   ALLERGIES: Allergies  Allergen Reactions   Clonidine Derivatives Other (See Comments)    Causes symptomatic bradycardia with syncope and hypotension    HOME MEDICATIONS: Current Outpatient Medications  Medication Sig Dispense Refill   acetaminophen (TYLENOL) 650 MG CR tablet Take 650 mg by mouth 3 (three) times daily.     aluminum hydroxide-magnesium carbonate (ACID GONE) 95-358 MG/15ML SUSP Take by mouth.     Artificial Tear Ointment (LACRI-LUBE OP) Place 1 application into both eyes at bedtime.     benazepril (LOTENSIN) 10 MG tablet Take 10 mg by mouth at bedtime.     bisacodyl (DULCOLAX) 10 MG suppository Place 10 mg rectally as needed for moderate constipation.     brompheniramine-pseudoephedrine-dextromethorphan (DIMETAPP DM) 15-1-5 MG/5ML ELIX Take by mouth every 6 (six) hours as needed.     calcitonin, salmon, (MIACALCIN/FORTICAL) 200 UNIT/ACT nasal spray Place 1 spray into alternate nostrils daily.     Cholecalciferol (VITAMIN D3) 50 MCG (2000 UT) TABS Take 2,000 Units by mouth every morning.     ciclopirox (PENLAC) 8 % solution Apply 1  application topically See admin instructions. Apply topically to toenails every Wednesday evening     Cold Sore Products (BLISTEX EX) Apply topically.     Diethyltoluamide (OFF ACTIVE EX) Apply topically.     diphenhydrAMINE (BENADRYL) 25 MG tablet Take 25 mg by mouth as needed.     Docosanol (ABREVA) 10 % CREA Apply topically.     Docosanol 10 % CREA Apply topically.     doxycycline (VIBRAMYCIN) 50 MG capsule Take 50 mg by mouth daily.     guaiFENesin (SILTUSSIN DAS) 100 MG/5ML liquid Take 5 mLs by mouth every 4 (four) hours as needed for cough or to loosen phlegm.     Homeopathic Products (MURINE EAR WAX RELIEF OT) Place in ear(s).     hydrocortisone cream 1 % Apply 1 application topically 2 (two) times daily.     Infant Care Products (JOHNSONS BABY SHAMPOO) SHAM Apply 1 application topically See admin instructions. Mix 1/2 water and 1/2 shampoo - use to scrub eyebrows and eyelids every morning     Lacosamide (VIMPAT) 150 MG TABS Take 1 tablet (150 mg total) by mouth 2 (two) times daily. 60 tablet 5   lactulose (CHRONULAC) 10 GM/15ML solution Take 10 g by mouth  every morning.     levETIRAcetam (KEPPRA) 250 MG tablet Take 2 tablets (500 mg total) by mouth 2 (two) times daily. 60 tablet 0   levothyroxine (SYNTHROID) 88 MCG tablet Take 88 mcg by mouth every morning.     magnesium hydroxide (MILK OF MAGNESIA) 400 MG/5ML suspension Take by mouth daily as needed for mild constipation.     Omega-3 Fatty Acids (FISH OIL) 500 MG CAPS Take 1,500 mg by mouth every morning.     promethazine (PHENERGAN) 25 MG tablet Take 25 mg by mouth as needed for nausea or vomiting.     pyridOXINE (VITAMIN B-6) 100 MG tablet Take 100 mg by mouth every morning.     Soap & Cleansers (PHISODERM EX) Apply 1 application topically 2 (two) times daily. Apply to face     Sunscreens (COPPERTONE DEFEND & CARE EX) Apply topically.     Tetrahydrozoline HCl (VISINE OP) Place 1 drop into both eyes 3 (three) times daily.      tolnaftate (TINACTIN) 1 % spray Apply 1 application topically See admin instructions. Spray topically between toes daily at bedtime     Zinc Oxide (BALMEX EX) Apply topically.     No current facility-administered medications for this visit.    PAST MEDICAL HISTORY: Past Medical History:  Diagnosis Date   Down's syndrome    GERD (gastroesophageal reflux disease)    Hypertension    Hypothyroid    Lives in group home    [facility]-[phone]-Roxanne-nurse   Nystagmus    Obstructive sleep apnea (adult) (pediatric)    does use a cpap at night   Seizure disorder (HCC) 10/28/2016   Seizures (HCC)     PAST SURGICAL HISTORY: Past Surgical History:  Procedure Laterality Date   IRRIGATION AND DEBRIDEMENT  07/01/2014       ORIF HUMERUS FRACTURE Left 02/05/2013   Procedure: OPEN REDUCTION INTERNAL FIXATION (ORIF) MEDIAL HUMERUS CONDYLE FRACTURE;  Surgeon: Jodi Marbleavid A Thompson, MD;  Location: [facility];  Service: Orthopedics;  Laterality: Left;   Right Elbow surgery      FAMILY HISTORY: Family History  Problem Relation Age of Onset   Dementia Maternal Grandmother    Diabetes Paternal Grandfather    Heart failure Paternal Grandfather    Hypertension Paternal Grandfather     SOCIAL HISTORY: Social History   Socioeconomic History   Marital status: Single    Spouse name: Not on file   Number of children: 0   Years of education: 12   Highest education level: Not on file  Occupational History   Occupation: [employer]  Tobacco Use   Smoking status: Never   Smokeless tobacco: Never  Vaping Use   Vaping Use: Never used  Substance and Sexual Activity   Alcohol use: No   Drug use: No   Sexual activity: Not Currently    Birth control/protection: None  Other Topics Concern   Not on file  Social History Narrative   Lives at a group home   Caffeine use: Tea/soda once per week   Right handed   Social Determinants of Health   Financial Resource  Strain: Not on file  Food Insecurity: Not on file  Transportation Needs: Not on file  Physical Activity: Not on file  Stress: Not on file  Social Connections: Not on file  Intimate Partner Violence: Not on file      Levert FeinsteinYijun Marivel Mcclarty, M.D. Ph.D.  [facility] [address], [address] [place], [place] [address] Ph: [phone]  Fax: [phone]  CC:  Lucretia [facility], [place] M [address] [place] [id number]  [facility]ung[facility], [place] M

## 2021-07-07 LAB — COMPREHENSIVE METABOLIC PANEL
ALT: 27 IU/L (ref 0–44)
AST: 36 IU/L (ref 0–40)
Albumin/Globulin Ratio: 1.4 (ref 1.2–2.2)
Albumin: 4.1 g/dL (ref 3.8–4.9)
Alkaline Phosphatase: 118 IU/L (ref 44–121)
BUN/Creatinine Ratio: 16 (ref 9–20)
BUN: 15 mg/dL (ref 6–24)
Bilirubin Total: 0.5 mg/dL (ref 0.0–1.2)
CO2: 22 mmol/L (ref 20–29)
Calcium: 9.1 mg/dL (ref 8.7–10.2)
Chloride: 101 mmol/L (ref 96–106)
Creatinine, Ser: 0.93 mg/dL (ref 0.76–1.27)
Globulin, Total: 3 g/dL (ref 1.5–4.5)
Glucose: 94 mg/dL (ref 70–99)
Potassium: 4.3 mmol/L (ref 3.5–5.2)
Sodium: 141 mmol/L (ref 134–144)
Total Protein: 7.1 g/dL (ref 6.0–8.5)
eGFR: 98 mL/min/{1.73_m2} (ref >59)

## 2021-07-07 LAB — CBC WITH DIFFERENTIAL/PLATELET
Basophils Absolute: 0 10*3/uL (ref 0.0–0.2)
Basos: 1 %
EOS (ABSOLUTE): 0 10*3/uL (ref 0.0–0.4)
Eos: 1 %
Hematocrit: 41.8 % (ref 37.5–51.0)
Hemoglobin: 14.2 g/dL (ref 13.0–17.7)
Immature Grans (Abs): 0 10*3/uL (ref 0.0–0.1)
Immature Granulocytes: 0 %
Lymphocytes Absolute: 2 10*3/uL (ref 0.7–3.1)
Lymphs: 41 %
MCH: 33.8 pg — ABNORMAL HIGH (ref 26.6–33.0)
MCHC: 34 g/dL (ref 31.5–35.7)
MCV: 100 fL — ABNORMAL HIGH (ref 79–97)
Monocytes Absolute: 0.4 10*3/uL (ref 0.1–0.9)
Monocytes: 9 %
Neutrophils Absolute: 2.5 10*3/uL (ref 1.4–7.0)
Neutrophils: 48 %
Platelets: 267 10*3/uL (ref 150–450)
RBC: 4.2 x10E6/uL (ref 4.14–5.80)
RDW: 12.5 % (ref 11.6–15.4)
WBC: 5 10*3/uL (ref 3.4–10.8)

## 2021-07-07 LAB — TSH: TSH: 0.748 u[IU]/mL (ref 0.450–4.500)

## 2021-07-07 LAB — LACOSAMIDE: Lacosamide: 13.4 ug/mL — ABNORMAL HIGH (ref 5.0–10.0)

## 2021-07-10 ENCOUNTER — Telehealth: Payer: Self-pay | Admitting: Neurology

## 2021-07-10 NOTE — Telephone Encounter (Signed)
Dr. Dayna Barker called wanting to know the update on the pt's levETIRAcetam (KEPPRA) 250 MG tablet so that everyone is on the same page. Please advise.

## 2021-07-13 NOTE — Telephone Encounter (Signed)
I returned the call to [facility] at [phone] and spoke to [place]. She took down the information below for Dr. Dayna Barker. A copy of the AVS was also faxed to her attention at [phone].

## 2021-07-13 NOTE — Telephone Encounter (Addendum)
AVS from visit on 07/03/2021:

## 2021-07-20 ENCOUNTER — Other Ambulatory Visit: Payer: Self-pay

## 2021-07-20 ENCOUNTER — Emergency Department (HOSPITAL_COMMUNITY): Payer: Medicare Other

## 2021-07-20 ENCOUNTER — Emergency Department (HOSPITAL_COMMUNITY)
Admission: EM | Admit: 2021-07-20 | Discharge: 2021-07-20 | Disposition: A | Discharge status: Home / Self Care | Payer: Medicare Other | Attending: Emergency Medicine | Admitting: Emergency Medicine

## 2021-07-20 ENCOUNTER — Encounter (HOSPITAL_COMMUNITY): Payer: Self-pay

## 2021-07-20 DIAGNOSIS — W19XXXA Unspecified fall, initial encounter: Secondary | ICD-10-CM | POA: Diagnosis not present

## 2021-07-20 DIAGNOSIS — S0993XA Unspecified injury of face, initial encounter: Secondary | ICD-10-CM

## 2021-07-20 DIAGNOSIS — S00511A Abrasion of lip, initial encounter: Secondary | ICD-10-CM | POA: Diagnosis not present

## 2021-07-20 DIAGNOSIS — Y92129 Unspecified place in nursing home as the place of occurrence of the external cause: Secondary | ICD-10-CM | POA: Diagnosis not present

## 2021-07-20 DIAGNOSIS — S0083XA Contusion of other part of head, initial encounter: Secondary | ICD-10-CM | POA: Diagnosis not present

## 2021-07-20 LAB — URINALYSIS, ROUTINE W REFLEX MICROSCOPIC
Bilirubin Urine: NEGATIVE
Glucose, UA: NEGATIVE mg/dL
Hgb urine dipstick: NEGATIVE
Ketones, ur: NEGATIVE mg/dL
Leukocytes,Ua: NEGATIVE
Nitrite: NEGATIVE
Protein, ur: NEGATIVE mg/dL
Specific Gravity, Urine: 1.012 (ref 1.005–1.030)
pH: 6 (ref 5.0–8.0)

## 2021-07-20 NOTE — ED Triage Notes (Signed)
Patient is from a group home. Patient had a fall this AM and the group home staff member is not sure if the patient had a seizure or not. Patient is nonverbal. Patient has been crying, but the group home staff member states that is his normal behavior. Patient has a small amount of blood on his bottom lip.  Patient is not on blood thinners.

## 2021-07-20 NOTE — Discharge Instructions (Signed)
You were seen in the emergency department for evaluation of injuries from a fall.  You had some bruises and abrasions to your face and mouth.  You can use ice to these areas as tolerated.  Tylenol as needed for pain.  Head injury instructions given, return emergency department if any worsening or concerning symptoms

## 2021-07-20 NOTE — ED Provider Notes (Signed)
[facility]-EMERGENCY DEPT Provider Note   CSN: [id number] Arrival date & time: 07/20/21  0827     History  Chief Complaint  Patient presents with   Fall    BRYCESON GRAPE is a 56 y.o. male.  Level 5 caveat secondary to intellectual disability and minimal communication.  56 year old male coming from group home for evaluation of fall with facial injury.  History of falls seizure and unsteady gait.  No reported loss of consciousness.  No reported seizure-like activity.  He is at his baseline mental status per caregiver.   The history is provided by the patient and a caregiver.  Fall This is a recurrent problem. The current episode started 1 to 2 hours ago. Pertinent negatives include no chest pain, no abdominal pain, no headaches and no shortness of breath. Nothing aggravates the symptoms. Nothing relieves the symptoms. He has tried nothing for the symptoms. The treatment provided no relief.      Home Medications Prior to Admission medications   Medication Sig Start Date End Date Taking? Authorizing Provider  acetaminophen (TYLENOL) 650 MG CR tablet Take 650 mg by mouth 3 (three) times daily.    [provider]  aluminum hydroxide-magnesium carbonate (ACID GONE) 95-358 MG/15ML SUSP Take by mouth.    [provider]  Artificial Tear Ointment (LACRI-LUBE OP) Place 1 application into both eyes at bedtime.    [provider]  benazepril (LOTENSIN) 10 MG tablet Take 10 mg by mouth at bedtime.    [provider]  bisacodyl (DULCOLAX) 10 MG suppository Place 10 mg rectally as needed for moderate constipation.    [provider]  brompheniramine-pseudoephedrine-dextromethorphan (DIMETAPP DM) 15-1-5 MG/5ML ELIX Take by mouth every 6 (six) hours as needed.    [provider]  calcitonin, salmon, (MIACALCIN/FORTICAL) 200 UNIT/ACT nasal spray Place 1 spray into alternate nostrils daily.    [provider]   Cholecalciferol (VITAMIN D3) 50 MCG (2000 UT) TABS Take 2,000 Units by mouth every morning.    [provider]  ciclopirox (PENLAC) 8 % solution Apply 1 application topically See admin instructions. Apply topically to toenails every Wednesday evening 01/08/21   [provider]  Cold Sore Products (BLISTEX EX) Apply topically.    [provider]  Diethyltoluamide (OFF ACTIVE EX) Apply topically.    [provider]  diphenhydrAMINE (BENADRYL) 25 MG tablet Take 25 mg by mouth as needed.    [provider]  Docosanol (ABREVA) 10 % CREA Apply topically.    [provider]  Docosanol 10 % CREA Apply topically.    [provider]  doxycycline (VIBRAMYCIN) 50 MG capsule Take 50 mg by mouth daily.    [provider]  guaiFENesin (SILTUSSIN DAS) 100 MG/5ML liquid Take 5 mLs by mouth every 4 (four) hours as needed for cough or to loosen phlegm.    [provider]  Homeopathic Products (MURINE EAR WAX RELIEF OT) Place in ear(s).    [provider]  hydrocortisone cream 1 % Apply 1 application topically 2 (two) times daily.    [provider]  Infant Care Products (JOHNSONS BABY SHAMPOO) SHAM Apply 1 application topically See admin instructions. Mix 1/2 water and 1/2 shampoo - use to scrub eyebrows and eyelids every morning    [provider]  Lacosamide (VIMPAT) 150 MG TABS Take 1 tablet (150 mg total) by mouth 2 (two) times daily. 03/08/18   [name], NP  lactulose (CHRONULAC) 10 GM/15ML solution Take  10 g by mouth every morning.    [provider]  levETIRAcetam (KEPPRA) 250 MG tablet Take 2 tablets (500 mg total) by mouth 2 (two) times daily. 02/18/21   Kathlen Mody, MD  levothyroxine (SYNTHROID) 88 MCG tablet Take 88 mcg by mouth every morning. 01/16/21   [provider]  magnesium hydroxide (MILK OF MAGNESIA) 400 MG/5ML suspension Take by mouth daily as needed for mild  constipation.    [provider]  Omega-3 Fatty Acids (FISH OIL) 500 MG CAPS Take 1,500 mg by mouth every morning.    [provider]  promethazine (PHENERGAN) 25 MG tablet Take 25 mg by mouth as needed for nausea or vomiting.    [provider]  pyridOXINE (VITAMIN B-6) 100 MG tablet Take 100 mg by mouth every morning.    [provider]  Soap & Cleansers (PHISODERM EX) Apply 1 application topically 2 (two) times daily. Apply to face    [provider]  Sunscreens (COPPERTONE DEFEND & CARE EX) Apply topically.    [provider]  Tetrahydrozoline HCl (VISINE OP) Place 1 drop into both eyes 3 (three) times daily.    [provider]  tolnaftate (TINACTIN) 1 % spray Apply 1 application topically See admin instructions. Spray topically between toes daily at bedtime    [provider]  Zinc Oxide (BALMEX EX) Apply topically.    [provider]      Allergies    Clonidine derivatives    Review of Systems   Review of Systems  Unable to perform ROS: Other  Respiratory:  Negative for shortness of breath.   Cardiovascular:  Negative for chest pain.  Gastrointestinal:  Negative for abdominal pain.  Neurological:  Negative for headaches.   Physical Exam Updated Vital Signs BP (!) 166/101 (BP Location: Left Arm)    Pulse 84    Temp 97.7 F (36.5 C) (Axillary)    Resp 20    SpO2 100%  Physical Exam Vitals and nursing note reviewed.  Constitutional:      General: He is not in acute distress.    Appearance: Normal appearance. He is well-developed.  HENT:     Head:     Comments: Down's facies.  Small abrasion over left eyebrow and abrasion of upper lip, upper inner lip has some macerated gingiva Eyes:     Conjunctiva/sclera: Conjunctivae normal.  Cardiovascular:     Rate and Rhythm: Normal rate and regular rhythm.     Heart sounds: No murmur heard. Pulmonary:     Effort: Pulmonary effort is normal. No respiratory  distress.     Breath sounds: Normal breath sounds.  Abdominal:     Palpations: Abdomen is soft.     Tenderness: There is no abdominal tenderness. There is no guarding or rebound.  Musculoskeletal:        General: No swelling, tenderness or deformity. Normal range of motion.     Cervical back: Neck supple.  Skin:    General: Skin is warm and dry.     Capillary Refill: Capillary refill takes less than 2 seconds.  Neurological:     General: No focal deficit present.     Mental Status: He is alert.     Comments: Minimally verbal.  Following commands and moving all extremities.    ED Results / Procedures / Treatments   Labs (all labs ordered are listed, but only abnormal results are displayed) Labs Reviewed  URINALYSIS, ROUTINE W REFLEX MICROSCOPIC - Abnormal; Notable  for the following components:      Result Value   Color, Urine STRAW (*)    All other components within normal limits    EKG None  Radiology No results found.  Procedures Procedures    Medications Ordered in ED Medications - No data to display  ED Course/ Medical Decision Making/ A&P Clinical Course as of 07/20/21 1849  Mon Jul 20, 2021  6734 Apparently staff manager at group home is requesting imaging [MB]  1059 Patient was unable to comply with laying down for imaging, became very anxious.  I reviewed with staff member that I do not feel it is appropriate to do a sedation for the patient when I do not feel he has any significant physical exam findings warranting CT.  She reviewed this with primary care doctor and they are in agreement with observation at home [MB]    Clinical Course User Index [MB] Terrilee Files, MD                           Medical Decision Making Amount and/or Complexity of Data Reviewed Labs: ordered.  56 year old male history of unsteady gait falls Down syndrome dementia here after fall.  Minimal signs of facial trauma.  Not on blood thinners.  Baseline mental status.  Do not  feel needs advanced imaging at this time.  Patient has been eating and drinking well.  Reviewed recent neurology visit in epic which comments upon his worsening gait and falls.  They have been tapering him off his Keppra and primarily using Vimpat for seizure control.  No indication that this episode was associated with seizures.  Do not feel further lab work is indicated at this time, urinalysis negative for signs of infection and appears well-hydrated.  We will have him return to his facility where they can continue to observe him.  Head injury instructions given.         Final Clinical Impression(s) / ED Diagnoses Final diagnoses:  Fall, initial encounter  Contusion of face, initial encounter  Injury of lip, initial encounter    Rx / DC Orders ED Discharge Orders     None         Terrilee Files, MD 07/20/21 1850

## 2021-07-20 NOTE — ED Notes (Signed)
Pt unable to lie flat for CT, despite attempts be caregiver to explain and encourage. Caregiver called pt's primary care dr who agreed with assessment, discharge, and followup plan.

## 2021-08-01 ENCOUNTER — Emergency Department (HOSPITAL_COMMUNITY): Payer: Medicare Other

## 2021-08-01 ENCOUNTER — Emergency Department (HOSPITAL_COMMUNITY)
Admission: EM | Admit: 2021-08-01 | Discharge: 2021-08-01 | Disposition: A | Discharge status: Home / Self Care | Payer: Medicare Other | Attending: Emergency Medicine | Admitting: Emergency Medicine

## 2021-08-01 ENCOUNTER — Encounter (HOSPITAL_COMMUNITY): Payer: Self-pay | Admitting: Emergency Medicine

## 2021-08-01 ENCOUNTER — Other Ambulatory Visit: Payer: Self-pay

## 2021-08-01 DIAGNOSIS — Y92002 Bathroom of unspecified non-institutional (private) residence single-family (private) house as the place of occurrence of the external cause: Secondary | ICD-10-CM | POA: Insufficient documentation

## 2021-08-01 DIAGNOSIS — W19XXXA Unspecified fall, initial encounter: Secondary | ICD-10-CM

## 2021-08-01 DIAGNOSIS — S0990XA Unspecified injury of head, initial encounter: Secondary | ICD-10-CM | POA: Diagnosis present

## 2021-08-01 DIAGNOSIS — W01198A Fall on same level from slipping, tripping and stumbling with subsequent striking against other object, initial encounter: Secondary | ICD-10-CM | POA: Diagnosis not present

## 2021-08-01 DIAGNOSIS — S0083XA Contusion of other part of head, initial encounter: Secondary | ICD-10-CM

## 2021-08-01 DIAGNOSIS — S0010XA Contusion of unspecified eyelid and periocular area, initial encounter: Secondary | ICD-10-CM | POA: Insufficient documentation

## 2021-08-01 MED ORDER — LORAZEPAM 2 MG/ML IJ SOLN
2.0000 mg | Freq: Once | INTRAMUSCULAR | Status: AC
  Administered 2021-08-01: 2 mg via INTRAMUSCULAR
  Filled 2021-08-01: qty 1

## 2021-08-01 MED ORDER — HALOPERIDOL LACTATE 5 MG/ML IJ SOLN
4.0000 mg | Freq: Once | INTRAMUSCULAR | Status: AC
  Administered 2021-08-01: 4 mg via INTRAMUSCULAR
  Filled 2021-08-01: qty 1

## 2021-08-01 NOTE — ED Provider Notes (Signed)
[facility]-EMERGENCY DEPT Provider Note   CSN: [id number] Arrival date & time: 08/01/21  1004     History  Chief Complaint  Patient presents with   Fall    [name] is a 56 y.o. male.   Fall Pertinent negatives include no shortness of breath. Patient nonverbal at baseline.  History of Down syndrome.  Had a fall.  History of seizures.  Sent in for further evaluation for head injury.  At baseline.  Swelling of forehead and some mild periorbital hematoma.    Past Medical History:  Diagnosis Date   Down's syndrome    GERD (gastroesophageal reflux disease)    Hypertension    Hypothyroid    Lives in group home    [facility]-[phone]-[phone]-Roxanne-nurse   Nystagmus    Obstructive sleep apnea (adult) (pediatric)    does use a cpap at night   Seizure disorder (HCC) 10/28/2016   Seizures (HCC)     Home Medications Prior to Admission medications   Medication Sig Start Date End Date Taking? Authorizing Provider  acetaminophen (TYLENOL) 650 MG CR tablet Take 650 mg by mouth 3 (three) times daily.    [provider]  aluminum hydroxide-magnesium carbonate (ACID GONE) 95-358 MG/15ML SUSP Take by mouth.    [provider]  Artificial Tear Ointment (LACRI-LUBE OP) Place 1 application into both eyes at bedtime.    [provider]  benazepril (LOTENSIN) 10 MG tablet Take 10 mg by mouth at bedtime.    [provider]  bisacodyl (DULCOLAX) 10 MG suppository Place 10 mg rectally as needed for moderate constipation.    [provider]  brompheniramine-pseudoephedrine-dextromethorphan (DIMETAPP DM) 15-1-5 MG/5ML ELIX Take by mouth every 6 (six) hours as needed.    [provider]  calcitonin, salmon, (MIACALCIN/FORTICAL) 200 UNIT/ACT nasal spray Place 1 spray into alternate nostrils daily.    [provider]  Cholecalciferol (VITAMIN D3) 50 MCG (2000 UT) TABS Take 2,000 Units by mouth  every morning.    [provider]  ciclopirox (PENLAC) 8 % solution Apply 1 application topically See admin instructions. Apply topically to toenails every Wednesday evening 01/08/21   [provider]  Cold Sore Products (BLISTEX EX) Apply topically.    [provider]  Diethyltoluamide (OFF ACTIVE EX) Apply topically.    [provider]  diphenhydrAMINE (BENADRYL) 25 MG tablet Take 25 mg by mouth as needed.    [provider]  Docosanol (ABREVA) 10 % CREA Apply topically.    [provider]  Docosanol 10 % CREA Apply topically.    [provider]  doxycycline (VIBRAMYCIN) 50 MG capsule Take 50 mg by mouth daily.    [provider]  guaiFENesin (SILTUSSIN DAS) 100 MG/5ML liquid Take 5 mLs by mouth every 4 (four) hours as needed for cough or to loosen phlegm.    [provider]  Homeopathic Products (MURINE EAR WAX RELIEF OT) Place in ear(s).    [provider]  hydrocortisone cream 1 % Apply 1 application topically 2 (two) times daily.    [provider]  Infant Care Products (JOHNSONS BABY SHAMPOO) SHAM Apply 1 application topically See admin instructions. Mix 1/2 water and 1/2 shampoo - use to scrub eyebrows and eyelids every morning    [provider]  Lacosamide (VIMPAT) 150 MG TABS Take 1 tablet (150 mg total) by mouth 2 (two) times daily. 03/08/18   [name], NP  lactulose (CHRONULAC) 10 GM/15ML solution Take 10  g by mouth every morning.    [provider]  levETIRAcetam (KEPPRA) 250 MG tablet Take 2 tablets (500 mg total) by mouth 2 (two) times daily. 02/18/21   Kathlen Mody, MD  levothyroxine (SYNTHROID) 88 MCG tablet Take 88 mcg by mouth every morning. 01/16/21   [provider]  magnesium hydroxide (MILK OF MAGNESIA) 400 MG/5ML suspension Take by mouth daily as needed for mild constipation.    [provider]  Omega-3 Fatty Acids (FISH OIL) 500 MG CAPS  Take 1,500 mg by mouth every morning.    [provider]  promethazine (PHENERGAN) 25 MG tablet Take 25 mg by mouth as needed for nausea or vomiting.    [provider]  pyridOXINE (VITAMIN B-6) 100 MG tablet Take 100 mg by mouth every morning.    [provider]  Soap & Cleansers (PHISODERM EX) Apply 1 application topically 2 (two) times daily. Apply to face    [provider]  Sunscreens (COPPERTONE DEFEND & CARE EX) Apply topically.    [provider]  Tetrahydrozoline HCl (VISINE OP) Place 1 drop into both eyes 3 (three) times daily.    [provider]  tolnaftate (TINACTIN) 1 % spray Apply 1 application topically See admin instructions. Spray topically between toes daily at bedtime    [provider]  Zinc Oxide (BALMEX EX) Apply topically.    [provider]      Allergies    Clonidine derivatives    Review of Systems   Review of Systems  Respiratory:  Negative for shortness of breath.    Physical Exam Updated Vital Signs BP 137/74    Pulse 67    Temp 98.2 F (36.8 C) (Oral)    Resp 18    SpO2 98%  Physical Exam Vitals reviewed.  HENT:     Head:     Comments: Hematoma left forehead with mild periorbital ecchymosis on left side.  Eye movements intact. Cardiovascular:     Rate and Rhythm: Regular rhythm.  Abdominal:     General: Abdomen is flat.  Musculoskeletal:        General: No tenderness.     Cervical back: Neck supple. No tenderness.  Skin:    General: Skin is warm.  Neurological:     Mental Status: He is alert.     Comments: At patient's nonverbal baseline.    ED Results / Procedures / Treatments   Labs (all labs ordered are listed, but only abnormal results are displayed) Labs Reviewed - No data to display  EKG None  Radiology CT HEAD WO CONTRAST ( )  Result Date: 08/01/2021 CLINICAL DATA:  Injury.  Head trauma.  Frontal hematoma. EXAM: CT HEAD WITHOUT CONTRAST CT MAXILLOFACIAL  WITHOUT CONTRAST CT CERVICAL SPINE WITHOUT CONTRAST TECHNIQUE: Multidetector CT imaging of the head, cervical spine, and maxillofacial structures were performed using the standard protocol without intravenous contrast. Multiplanar CT image reconstructions of the cervical spine and maxillofacial structures were also generated. RADIATION DOSE REDUCTION: This exam was performed according to the departmental dose-optimization program which includes automated exposure control, adjustment of the mA and/or kV according to patient size and/or use of iterative reconstruction technique. COMPARISON:  02/16/2021 and 02/15/2021. FINDINGS: CT HEAD FINDINGS Brain: No evidence of acute infarction, hemorrhage, hydrocephalus, extra-axial collection or mass lesion/mass effect. There is ventricular and sulcal enlargement that is without significant change from the prior exam. Vascular: No hyperdense vessel or unexpected calcification. Skull: Normal. Negative for fracture or focal lesion. Other:  Left frontal scalp hematoma. CT MAXILLOFACIAL FINDINGS Osseous: No fracture or mandibular dislocation. No destructive process. Orbits: Negative. No traumatic or inflammatory finding. Sinuses: Clear. Soft tissues: Negative. CT CERVICAL SPINE FINDINGS Alignment: Mild kyphosis, apex at C3. Slight anterolisthesis of C2 on C3. Skull base and vertebrae: No acute fracture. No primary bone lesion or focal pathologic process. Soft tissues and spinal canal: No prevertebral fluid or swelling. No visible canal hematoma. Disc levels: Marked loss of disc height at C3-C4 with moderate loss of disc height at C4-C5 through C6-C7. Spondylotic disc bulging and endplate spurring. Significant facet degenerative changes on the right at C2-C3. No convincing disc herniation. No change. Upper chest: There are linear type upper lung opacities most consistent with scarring. No acute findings. Other: None. IMPRESSION: HEAD CT 1. No acute intracranial abnormalities. 2.  Frontal scalp hematoma.  No skull fracture. MAXILLOFACIAL CT 1. No fracture or acute finding. CERVICAL CT 1. No fracture or acute finding. 2. Advanced degenerative changes. Electronically Signed   By: Amie Portlandavid  Ormond M.D.   On: 08/01/2021 14:08   CT Cervical Spine Wo Contrast  Result Date: 08/01/2021 CLINICAL DATA:  Injury.  Head trauma.  Frontal hematoma. EXAM: CT HEAD WITHOUT CONTRAST CT MAXILLOFACIAL WITHOUT CONTRAST CT CERVICAL SPINE WITHOUT CONTRAST TECHNIQUE: Multidetector CT imaging of the head, cervical spine, and maxillofacial structures were performed using the standard protocol without intravenous contrast. Multiplanar CT image reconstructions of the cervical spine and maxillofacial structures were also generated. RADIATION DOSE REDUCTION: This exam was performed according to the departmental dose-optimization program which includes automated exposure control, adjustment of the mA and/or kV according to patient size and/or use of iterative reconstruction technique. COMPARISON:  02/16/2021 and 02/15/2021. FINDINGS: CT HEAD FINDINGS Brain: No evidence of acute infarction, hemorrhage, hydrocephalus, extra-axial collection or mass lesion/mass effect. There is ventricular and sulcal enlargement that is without significant change from the prior exam. Vascular: No hyperdense vessel or unexpected calcification. Skull: Normal. Negative for fracture or focal lesion. Other: Left frontal scalp hematoma. CT MAXILLOFACIAL FINDINGS Osseous: No fracture or mandibular dislocation. No destructive process. Orbits: Negative. No traumatic or inflammatory finding. Sinuses: Clear. Soft tissues: Negative. CT CERVICAL SPINE FINDINGS Alignment: Mild kyphosis, apex at C3. Slight anterolisthesis of C2 on C3. Skull base and vertebrae: No acute fracture. No primary bone lesion or focal pathologic process. Soft tissues and spinal canal: No prevertebral fluid or swelling. No visible canal hematoma. Disc levels: Marked loss of disc  height at C3-C4 with moderate loss of disc height at C4-C5 through C6-C7. Spondylotic disc bulging and endplate spurring. Significant facet degenerative changes on the right at C2-C3. No convincing disc herniation. No change. Upper chest: There are linear type upper lung opacities most consistent with scarring. No acute findings. Other: None. IMPRESSION: HEAD CT 1. No acute intracranial abnormalities. 2. Frontal scalp hematoma.  No skull fracture. MAXILLOFACIAL CT 1. No fracture or acute finding. CERVICAL CT 1. No fracture or acute finding. 2. Advanced degenerative changes. Electronically Signed   By: Amie Portlandavid  Ormond M.D.   On: 08/01/2021 14:08   CT Maxillofacial Wo Contrast  Result Date: 08/01/2021 CLINICAL DATA:  Injury.  Head trauma.  Frontal hematoma. EXAM: CT HEAD WITHOUT CONTRAST CT MAXILLOFACIAL WITHOUT CONTRAST CT CERVICAL SPINE WITHOUT CONTRAST TECHNIQUE: Multidetector CT imaging of the head, cervical spine, and maxillofacial structures were performed using the standard protocol without intravenous contrast. Multiplanar CT image reconstructions of the cervical spine and maxillofacial structures were also generated. RADIATION DOSE REDUCTION: This exam was performed according to  the departmental dose-optimization program which includes automated exposure control, adjustment of the mA and/or kV according to patient size and/or use of iterative reconstruction technique. COMPARISON:  02/16/2021 and 02/15/2021. FINDINGS: CT HEAD FINDINGS Brain: No evidence of acute infarction, hemorrhage, hydrocephalus, extra-axial collection or mass lesion/mass effect. There is ventricular and sulcal enlargement that is without significant change from the prior exam. Vascular: No hyperdense vessel or unexpected calcification. Skull: Normal. Negative for fracture or focal lesion. Other: Left frontal scalp hematoma. CT MAXILLOFACIAL FINDINGS Osseous: No fracture or mandibular dislocation. No destructive process. Orbits: Negative.  No traumatic or inflammatory finding. Sinuses: Clear. Soft tissues: Negative. CT CERVICAL SPINE FINDINGS Alignment: Mild kyphosis, apex at C3. Slight anterolisthesis of C2 on C3. Skull base and vertebrae: No acute fracture. No primary bone lesion or focal pathologic process. Soft tissues and spinal canal: No prevertebral fluid or swelling. No visible canal hematoma. Disc levels: Marked loss of disc height at C3-C4 with moderate loss of disc height at C4-C5 through C6-C7. Spondylotic disc bulging and endplate spurring. Significant facet degenerative changes on the right at C2-C3. No convincing disc herniation. No change. Upper chest: There are linear type upper lung opacities most consistent with scarring. No acute findings. Other: None. IMPRESSION: HEAD CT 1. No acute intracranial abnormalities. 2. Frontal scalp hematoma.  No skull fracture. MAXILLOFACIAL CT 1. No fracture or acute finding. CERVICAL CT 1. No fracture or acute finding. 2. Advanced degenerative changes. Electronically Signed   By: Amie Portland M.D.   On: 08/01/2021 14:08    Procedures Procedures    Medications Ordered in ED Medications  haloperidol lactate (HALDOL) injection 4 mg (4 mg Intramuscular Given 08/01/21 1156)  LORazepam (ATIVAN) injection 2 mg (2 mg Intramuscular Given 08/01/21 1157)    ED Course/ Medical Decision Making/ A&P                           Medical Decision Making Amount and/or Complexity of Data Reviewed Radiology: ordered.  Risk Prescription drug management.   Patient with fall.  Left-sided hematoma on forehead.  CT eventually able to be done after Haldol and Ativan.  Reassuring.  Discussed with patient's caregivers both 1 that was here and the director of his home.  No severe injury seen.  Mild head injury.  Reviewed previous ER notes.  Independently interpreted CT scan with no intracranial injury.  Will discharge.        Final Clinical Impression(s) / ED Diagnoses Final diagnoses:  Fall, initial  encounter  Traumatic hematoma of forehead, initial encounter  Minor head injury, initial encounter    Rx / DC Orders ED Discharge Orders     None         Benjiman Core, MD 08/01/21 1537

## 2021-08-01 NOTE — Discharge Instructions (Addendum)
The CT scans were reassuring.  Besides the swelling of the forehead no other severe injury found. ?

## 2021-08-01 NOTE — ED Notes (Signed)
Pt transported to CT at this time.

## 2021-08-01 NOTE — ED Triage Notes (Signed)
Patient [name], patient non verbal, from group home, patient fell in bathroom this morning, hematoma to left side of face. Patient unable to verbalize pain/injury, but will withdraw to painful touch. Director of group home en route to ED  ?

## 2021-08-01 NOTE — ED Notes (Signed)
Spoke with [place] from group home regarding D/C of patient. Kesselly verbalized understanding of d/c instructions. Pt was wheeled to [facility] and loaded into [facility] with [employer]. ?

## 2021-08-05 ENCOUNTER — Telehealth: Payer: Self-pay

## 2021-08-05 NOTE — Addendum Note (Signed)
Addended by: Lilla Shook on: 08/05/2021 05:48 PM ? ? Modules accepted: Orders ? ?

## 2021-08-05 NOTE — Telephone Encounter (Signed)
Please verify his medication list, also ask if he has any recurrent seizure ? ?Based on last visit in February 2023, he should only take Vimpat 150 mg twice a day, weaning off Keppra 250 mg 2 tablets twice a day ? ?If it is difficult to talk with the staff to clarify the medication list, or if he is already off [place] still have recurrent fall, putting him on nurse practitioner revisit ? ? ?

## 2021-08-05 NOTE — Telephone Encounter (Signed)
I spoke to his nurse, Tameka. She confirmed he is only taking lacosamide 150mg , one tab BID. He is off levetiracetam. She has noticed some intermittent jerking movements that could potentially represent seizures, some rather violent (example - sitting at a table, one significant jerk then hit his head on the table then falling to the ground. Able to get up). He has a strong history of multiple falls but it has been more frequent recently. Feels the changes have come after the discontinuation of levetiracetam. ? ?Per vo by Dr. , increase lacosamide to 200mg , one tab BID and schedule him the next available follow up w/ NP. Provided verbal orders to Terrace Arabia, RN and she repeated them back correctly. She will order the new dosage of medication from the pharmacy that services his group home. Appt scheduled.  ? ?For the falls, they are also consulting with his PCP, PT and psychiatric MD.  ?

## 2021-08-05 NOTE — Telephone Encounter (Signed)
Charletta Cousin, RN from [facility] called to report patient has had multiple falls. She stated this past July he had fallen and broken a tooth. Most recently, Saturday he fell and bruised his face. She reviewed visit notes from last visit in our office and could not find a possible cause for patient's falls.  ? ?RN inquired about anything that came up in testing that would show why he is falling frequently. ? ?Requesting call back. ? ?Charletta Cousin, RN ?Cell #: [phone] ?

## 2021-08-26 ENCOUNTER — Encounter: Payer: Self-pay | Admitting: Podiatry

## 2021-08-26 ENCOUNTER — Ambulatory Visit (INDEPENDENT_AMBULATORY_CARE_PROVIDER_SITE_OTHER): Payer: Medicare Other | Admitting: Podiatry

## 2021-08-26 DIAGNOSIS — B351 Tinea unguium: Secondary | ICD-10-CM

## 2021-08-26 DIAGNOSIS — M79674 Pain in right toe(s): Secondary | ICD-10-CM

## 2021-08-26 DIAGNOSIS — M79675 Pain in left toe(s): Secondary | ICD-10-CM

## 2021-08-26 NOTE — Progress Notes (Signed)
Subjective:  ? ?Patient ID: [name], male   DOB: 56 y.o.   MRN: [id number]  ? ?HPI ?Patient presents with caregiver and father with elongated nailbeds of both feet a somewhat agitated condition and long-term history of fungal infection ? ? ?ROS ? ? ?   ?Objective:  ?Physical Exam  ?Thick yellow brittle nailbeds 1-5 of both feet that do have fungal component associated with them and are moderately painful and impossible for them to cut ? ?   ?Assessment:  ?Chronic mycotic nail infection that is long-term in nature and will not be able to be eradicated with medication ? ?   ?Plan:  ?Reviewed condition and explained this to family and at this point trimming down with mechanical debridement of the nailbeds.  No iatrogenic bleeding this can be repeated as needed and do not recommend further treatment currently ?   ? ? ?

## 2021-09-23 ENCOUNTER — Encounter: Payer: Self-pay | Admitting: Adult Health

## 2021-09-23 ENCOUNTER — Ambulatory Visit (INDEPENDENT_AMBULATORY_CARE_PROVIDER_SITE_OTHER): Payer: Medicare Other | Admitting: Adult Health

## 2021-09-23 VITALS — BP 158/78 | HR 70 | Ht 60.0 in | Wt 163.0 lb

## 2021-09-23 DIAGNOSIS — Q909 Down syndrome, unspecified: Secondary | ICD-10-CM | POA: Diagnosis not present

## 2021-09-23 DIAGNOSIS — G40909 Epilepsy, unspecified, not intractable, without status epilepticus: Secondary | ICD-10-CM

## 2021-09-23 MED ORDER — LACOSAMIDE 200 MG PO TABS: 200.0000 mg | ORAL_TABLET | Freq: Two times a day (BID) | ORAL | 3 refills | Status: DC

## 2021-09-23 NOTE — Progress Notes (Signed)
? ? ?PATIENT: [name] ?DOB: 11/21/1965 ? ?REASON FOR VISIT: follow up ?HISTORY FROM: patient ?PRIMARY NEUROLOGIST: Dr. Terrace Arabia ? ?Chief Complaint  ?Patient presents with  ? Follow-up  ?  Pt in 19  pt is here  with caregiver  pt is non verbal  pt is here for involuntary movements  and jerks and falls . Caregiver states pt  fell in feb and had laceration on face and black eye .   ? ? ? ?HISTORY OF PRESENT ILLNESS: ?Today 09/23/21: ? ?:He is here with a caregiver. She doesn't think he has had any seizure events. She states that sometimes he will jerk forward and then will fall backwards. She describes as an aggressive sneeze except he is not sneezing. Jerks started after fall in February. Does feel that he frequency has decreased. Facility called our office in March complaining of jerking episodes.  Vimpat at that point was increased to 200 mg twice a day however looking at his paperwork today he is still taking 150 mg twice a day.  Caregiver is not aware of any reason that he was not started on 200 mg twice a day ? ?HISTORY (copied from Dr. Zannie Cove note) ? [name] is a 56 year old male, seen in request by his primary care from [name] for evaluation of increased gait abnormality, he is accompanied by staff at today's visit on July 03, 2021. ?  ?  ?Patient has Down syndrome, has been In facility for many years, per caregiver who has known him since 2022, he does have some regression, was noted to have increased gait abnormality, there was no recurrent seizure reported ? ?He was seen by our clinic previously by Dr. Anne Hahn, last visit was with [facility] in October 2019, long history of seizure, previously was treated with Depakote, and Keppra, reported elevated ammonia level, Depakote was switched to Vimpat, titrating dose to 150 mg twice a day now, there was also reported behavior issue with higher dose of Keppra, ? ?  ?Patient had hospital admission in September 2022 when  she was found unresponsive, then became reactive but not himself, incontinence of urine, no seizure-like activity noted, ?  ?EEG by Dr. [name] on Feb 16 2021 ?This technically difficult study is consistent with patient's known history of epilepsy with generalized onset.There is also moderate diffuse encephalopathy, non etiology. No seizures were seen throughout the recording. ?  ?I personally reviewed CT head without contrast, no acute abnormality, parenchymal loss, enlarged ventricle, advanced for age ? ?CT of cervical spine, no acute abnormality, multilevel degenerative changes ? ?CT of maxillary showed mildly displaced fracture of left mandibular condyle, ? ?Laboratory evaluations in September 2022, normal procalcitonin, INR, CMP with mild decreased calcium 8.4, elevated alkaline phosphate 160, albumin 2.8, normal B12, folic acid, urinalysis showed rare bacteria, Keppra level was 6.5, ? ?He was diagnosed with acute metabolic encephalopathy, Keppra dose was increased from 250 mg twice a day to 500 mg twice a day, remained on Vimpat 150 mg twice a day  ? ?REVIEW OF SYSTEMS: Out of a complete 14 system review of symptoms, the patient complains only of the following symptoms, and all other reviewed systems are negative. ? ?ALLERGIES: ?Allergies  ?Allergen Reactions  ? Clonidine Derivatives Other (See Comments)  ?  Causes symptomatic bradycardia with syncope and hypotension  ? ? ?HOME MEDICATIONS: ?Outpatient Medications Prior to Visit  ?Medication Sig Dispense Refill  ? acetaminophen (TYLENOL) 650 MG CR tablet Take 650 mg by  mouth 3 (three) times daily.    ? aluminum hydroxide-magnesium carbonate (ACID GONE) 95-358 MG/15ML SUSP Take by mouth.    ? Artificial Tear Ointment (LACRI-LUBE OP) Place 1 application into both eyes at bedtime.    ? benazepril (LOTENSIN) 10 MG tablet Take 10 mg by mouth at bedtime.    ? bisacodyl (DULCOLAX) 10 MG suppository Place 10 mg rectally as needed for moderate constipation.    ?  brompheniramine-pseudoephedrine-dextromethorphan (DIMETAPP DM) 15-1-5 MG/5ML ELIX Take by mouth every 6 (six) hours as needed.    ? calcitonin, salmon, (MIACALCIN/FORTICAL) 200 UNIT/ACT nasal spray Place 1 spray into alternate nostrils daily.    ? Cholecalciferol (VITAMIN D3) 50 MCG (2000 UT) TABS Take 2,000 Units by mouth every morning.    ? ciclopirox (PENLAC) 8 % solution Apply 1 application topically See admin instructions. Apply topically to toenails every Wednesday evening    ? Cold Sore Products (BLISTEX EX) Apply topically.    ? Diethyltoluamide (OFF ACTIVE EX) Apply topically.    ? diphenhydrAMINE (BENADRYL) 25 MG tablet Take 25 mg by mouth as needed.    ? Docosanol 10 % CREA Apply topically.    ? Docosanol 10 % CREA Apply topically.    ? doxycycline (VIBRAMYCIN) 50 MG capsule Take 50 mg by mouth daily.    ? guaiFENesin (ROBITUSSIN) 100 MG/5ML liquid Take 5 mLs by mouth every 4 (four) hours as needed for cough or to loosen phlegm.    ? Homeopathic Products (MURINE EAR WAX RELIEF OT) Place in ear(s).    ? hydrocortisone cream 1 % Apply 1 application topically 2 (two) times daily.    ? Infant Care Products (JOHNSONS BABY SHAMPOO) SHAM Apply 1 application topically See admin instructions. Mix 1/2 water and 1/2 shampoo - use to scrub eyebrows and eyelids every morning    ? lacosamide (VIMPAT) 200 MG TABS tablet Take 200 mg by mouth 2 (two) times daily.    ? lactulose (CHRONULAC) 10 GM/15ML solution Take 10 g by mouth every morning.    ? levothyroxine (SYNTHROID) 88 MCG tablet Take 88 mcg by mouth every morning.    ? magnesium hydroxide (MILK OF MAGNESIA) 400 MG/5ML suspension Take by mouth daily as needed for mild constipation.    ? Omega-3 Fatty Acids (FISH OIL) 500 MG CAPS Take 1,500 mg by mouth every morning.    ? promethazine (PHENERGAN) 25 MG tablet Take 25 mg by mouth as needed for nausea or vomiting.    ? pyridOXINE (VITAMIN B-6) 100 MG tablet Take 100 mg by mouth every morning.    ? Soap & Cleansers  (PHISODERM EX) Apply 1 application topically 2 (two) times daily. Apply to face    ? Sunscreens (COPPERTONE DEFEND & CARE EX) Apply topically.    ? Tetrahydrozoline HCl (VISINE OP) Place 1 drop into both eyes 3 (three) times daily.    ? tolnaftate (TINACTIN) 1 % spray Apply 1 application topically See admin instructions. Spray topically between toes daily at bedtime    ? Zinc Oxide (BALMEX EX) Apply topically.    ? ?No facility-administered medications prior to visit.  ? ? ?PAST MEDICAL HISTORY: ?Past Medical History:  ?Diagnosis Date  ? Down's syndrome   ? GERD (gastroesophageal reflux disease)   ? Hypertension   ? Hypothyroid   ? Lives in group home   ? [facility]-[phone]-[phone]-nurse  ? Nystagmus   ? Obstructive sleep apnea (adult) (pediatric)   ? does use a cpap at night  ? Seizure  disorder (HCC) 10/28/2016  ? Seizures (HCC)   ? ? ?PAST SURGICAL HISTORY: ?Past Surgical History:  ?Procedure Laterality Date  ? IRRIGATION AND DEBRIDEMENT  07/01/2014  ?    ? ORIF HUMERUS FRACTURE Left 02/05/2013  ? Procedure: OPEN REDUCTION INTERNAL FIXATION (ORIF) MEDIAL HUMERUS CONDYLE FRACTURE;  Surgeon: Jodi Marble, MD;  Location: [facility];  Service: Orthopedics;  Laterality: Left;  ? Right Elbow surgery    ? ? ?FAMILY HISTORY: ?Family History  ?Problem Relation Age of Onset  ? Dementia Maternal Grandmother   ? Diabetes Paternal Grandfather   ? Heart failure Paternal Grandfather   ? Hypertension Paternal Grandfather   ? ? ?SOCIAL HISTORY: ?Social History  ? ?Socioeconomic History  ? Marital status: Single  ?  Spouse name: Not on file  ? Number of children: 0  ? Years of education: 15  ? Highest education level: Not on file  ?Occupational History  ? 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 Smoking status: Never  ? Smokeless tobacco: Never  ?Vaping Use  ? Vaping Use: Never used  ?Substance and Sexual Activity  ? Alcohol use: No  ? Drug use: No  ? Sexual activity: Not Currently  ?  Birth  control/protection: None  ?Other Topics Concern  ? Not on file  ?Social History Narrative  ? Lives at a group home  ? Caffeine use: Tea/soda once per week  ? Right handed  ? ?Social Determinants of Health  ? ?Financ

## 2021-09-23 NOTE — Patient Instructions (Signed)
Your Plan: ? ?Vimpat was increased in march to 200 mg BID but paperwork today shows that he is still taking 150 mg BID. Please increase mediation- unless there was a reason. ? ?If your symptoms worsen or you develop new symptoms please let us know.  ? ? ? ? ? ?Thank you for coming to see [place] at [facility]. I hope we have been able to provide you high quality care today. ? ?You may receive a patient satisfaction survey over the next few weeks. We would appreciate your feedback and comments so that we may continue to improve ourselves and the health of our patients. ? ?

## 2021-09-24 ENCOUNTER — Ambulatory Visit: Payer: Medicare Other | Admitting: Adult Health

## 2021-09-29 ENCOUNTER — Emergency Department (HOSPITAL_COMMUNITY)
Admission: EM | Admit: 2021-09-29 | Discharge: 2021-09-29 | Disposition: A | Discharge status: Home / Self Care | Payer: Medicare Other | Attending: Emergency Medicine | Admitting: Emergency Medicine

## 2021-09-29 ENCOUNTER — Encounter (HOSPITAL_COMMUNITY): Payer: Self-pay

## 2021-09-29 DIAGNOSIS — Y92009 Unspecified place in unspecified non-institutional (private) residence as the place of occurrence of the external cause: Secondary | ICD-10-CM | POA: Insufficient documentation

## 2021-09-29 DIAGNOSIS — W19XXXA Unspecified fall, initial encounter: Secondary | ICD-10-CM

## 2021-09-29 DIAGNOSIS — W06XXXA Fall from bed, initial encounter: Secondary | ICD-10-CM | POA: Insufficient documentation

## 2021-09-29 DIAGNOSIS — Z79899 Other long term (current) drug therapy: Secondary | ICD-10-CM | POA: Diagnosis not present

## 2021-09-29 DIAGNOSIS — S01112A Laceration without foreign body of left eyelid and periocular area, initial encounter: Secondary | ICD-10-CM | POA: Diagnosis not present

## 2021-09-29 DIAGNOSIS — E039 Hypothyroidism, unspecified: Secondary | ICD-10-CM | POA: Insufficient documentation

## 2021-09-29 DIAGNOSIS — S00202A Unspecified superficial injury of left eyelid and periocular area, initial encounter: Secondary | ICD-10-CM | POA: Diagnosis present

## 2021-09-29 DIAGNOSIS — I10 Essential (primary) hypertension: Secondary | ICD-10-CM | POA: Diagnosis not present

## 2021-09-29 MED ORDER — BACITRACIN ZINC 500 UNIT/GM EX OINT
TOPICAL_OINTMENT | Freq: Two times a day (BID) | CUTANEOUS | Status: DC
  Filled 2021-09-29: qty 0.9

## 2021-09-29 NOTE — ED Provider Notes (Signed)
?[facility]-EMERGENCY DEPT ?Provider Note ? ? ?CSN: [id number] ?Arrival date & time: 09/29/21  0956 ? ?  ? ?History ? ?No chief complaint on file. ? ? ?[name] is a 56 y.o. male. ? ?HPI ? ?  ? ?56 year old male with history of hypertension, Down syndrome, seizure disorder, presents with concern for fall. ? ?He has a history of balance problems and falls.  This morning, he it appeared that he had just tried to get out of bed and had a fall.  The fall was not witnessed, however the group home worker was immediately at his bedside after hearing the thump and did not see any sign of seizure-like activity or postictal state. ? ?He recently had his Vimpat increased by neurology.  He has not had any nausea, vomiting, or change in status today.  He was able to bear weight and walk with assistance per his baseline.  No other known vomiting, diarrhea, cough, fever.  Group home worker reports his vital signs at the group home were normal.  He had significant bleeding from a small wound above his left eyebrow.  He is not on anticoagulation.  Incident occurred at approximately 5:30 AM. ? ?Past Medical History:  ?Diagnosis Date  ? Down's syndrome   ? GERD (gastroesophageal reflux disease)   ? Hypertension   ? Hypothyroid   ? Lives in group home   ? [phone]-fax-[phone]-Roxanne-nurse  ? Nystagmus   ? Obstructive sleep apnea (adult) (pediatric)   ? does use a cpap at night  ? Seizure disorder (HCC) 10/28/2016  ? Seizures (HCC)   ?  ? ?Home Medications ?Prior to Admission medications   ?Medication Sig Start Date End Date Taking? Authorizing Provider  ?acetaminophen (TYLENOL) 650 MG CR tablet Take 650 mg by mouth 3 (three) times daily.    [provider]  ?aluminum hydroxide-magnesium carbonate (ACID GONE) 95-358 MG/15ML SUSP Take by mouth.    [provider]  ?Artificial Tear Ointment (LACRI-LUBE OP) Place 1 application into both eyes at bedtime.    [provider]  ?benazepril (LOTENSIN) 10 MG tablet Take 10 mg by mouth at bedtime.    [provider]  ?bisacodyl (DULCOLAX) 10 MG suppository Place 10 mg rectally as needed for moderate constipation.    [provider]  ?brompheniramine-pseudoephedrine-dextromethorphan (DIMETAPP DM) 15-1-5 MG/5ML ELIX Take by mouth every 6 (six) hours as needed.    [provider]  ?calcitonin, salmon, (MIACALCIN/FORTICAL) 200 UNIT/ACT nasal spray Place 1 spray into alternate nostrils daily.    [provider]  ?Cholecalciferol (VITAMIN D3) 50 MCG (2000 UT) TABS Take 2,000 Units by mouth every morning.    [provider]  ?ciclopirox (PENLAC) 8 % solution Apply 1 application topically See admin instructions. Apply topically to toenails every Wednesday evening 01/08/21   [provider]  ?Cold Sore Products (BLISTEX EX) Apply topically.    [provider]  ?Diethyltoluamide (OFF ACTIVE EX) Apply topically.    [provider]  ?diphenhydrAMINE (BENADRYL) 25 MG tablet Take 25 mg by mouth as needed.    [provider]  ?Docosanol 10 % CREA Apply topically.    [provider]  ?Docosanol 10 % CREA Apply topically.    [provider]  ?doxycycline (VIBRAMYCIN) 50 MG capsule Take 50 mg by mouth daily.    [provider]  ?guaiFENesin (ROBITUSSIN) 100 MG/5ML liquid Take 5 mLs by mouth every 4 (four) hours as needed for cough or to loosen  phlegm.    [provider]  ?Homeopathic Products (MURINE EAR WAX RELIEF OT) Place in ear(s).    [provider]  ?hydrocortisone cream 1 % Apply 1 application topically 2 (two) times daily.    [provider]  ?Infant Care Products (JOHNSONS BABY SHAMPOO) SHAM Apply 1 application topically See admin instructions. Mix 1/2 water and 1/2 shampoo - use to scrub eyebrows and eyelids every morning    [provider]  ?lacosamide (VIMPAT) 200 MG TABS tablet Take 1 tablet  (200 mg total) by mouth 2 (two) times daily. 09/23/21   [name], NP  ?lactulose (CHRONULAC) 10 GM/15ML solution Take 10 g by mouth every morning.    [provider]  ?levothyroxine (SYNTHROID) 88 MCG tablet Take 88 mcg by mouth every morning. 01/16/21   [provider]  ?magnesium hydroxide (MILK OF MAGNESIA) 400 MG/5ML suspension Take by mouth daily as needed for mild constipation.    [provider]  ?Omega-3 Fatty Acids (FISH OIL) 500 MG CAPS Take 1,500 mg by mouth every morning.    [provider]  ?promethazine (PHENERGAN) 25 MG tablet Take 25 mg by mouth as needed for nausea or vomiting.    [provider]  ?pyridOXINE (VITAMIN B-6) 100 MG tablet Take 100 mg by mouth every morning.    [provider]  ?Soap & Cleansers (PHISODERM EX) Apply 1 application topically 2 (two) times daily. Apply to face    [provider]  ?Sunscreens (COPPERTONE DEFEND & CARE EX) Apply topically.    [provider]  ?Tetrahydrozoline HCl (VISINE OP) Place 1 drop into both eyes 3 (three) times daily.    [provider]  ?tolnaftate (TINACTIN) 1 % spray Apply 1 application topically See admin instructions. Spray topically between toes daily at bedtime    [provider]  ?Zinc Oxide (BALMEX EX) Apply topically.    [provider]  ?   ? ?Allergies    ?Clonidine derivatives   ? ?Review of Systems   ?Review of Systems ? ?Physical Exam ?Updated Vital Signs ?BP (!) 163/88   Pulse 81   Temp (!) 97.5 ?F (36.4 ?C)   Resp 16   SpO2 99%  ?Physical Exam ?Vitals and nursing note reviewed.  ?Constitutional:   ?   General: He is not in acute distress. ?   Appearance: He is well-developed. He is not diaphoretic.  ?HENT:  ?   Head: Normocephalic.  ?   Comments: Contusion left supraorbital ?80mm superficial laceration superior to left elbow ?Abrasion right nares ?Eyes:  ?   Extraocular Movements: Extraocular movements intact.  ?    Conjunctiva/sclera: Conjunctivae normal.  ?   Pupils: Pupils are equal, round, and reactive to light.  ?   Comments: Nystagmus (baseline), normal ROM eyes  ?Cardiovascular:  ?   Rate and Rhythm: Normal rate and regular rhythm.  ?Pulmonary:  ?   Effort: Pulmonary effort is normal. No respiratory distress.  ?Chest:  ?   Chest wall: No tenderness.  ?Abdominal:  ?   General: There is no distension.  ?   Palpations: Abdomen is soft.  ?   Tenderness: There is no abdominal tenderness. There is no guarding.  ?Musculoskeletal:  ?   Cervical back: Normal range of motion.  ?Skin: ?   General: Skin is warm and dry.  ?Neurological:  ?   Mental Status: He is alert.  ?   Comments: Alert, interactive, answers yes ?Moving all 4 extremities, equal  strength, symmetric facies, full eye ROM, normal pupils  ? ? ?ED Results / Procedures / Treatments   ?Labs ?(all labs ordered are listed, but only abnormal results are displayed) ?Labs Reviewed - No data to display ? ?EKG ?None ? ?Radiology ?No results found. ? ?Procedures ?Procedures  ? ? ?Medications Ordered in ED ?Medications - No data to display ? ?ED Course/ Medical Decision Making/ A&P ?  ?                        ?Medical Decision Making ? ? ?56 year old male with history of hypertension, Down syndrome, seizure disorder, presents with concern for fall. ? ?By history suspect he lost balance and fell given no postictal state or witnessed seizure like activity.  No other significant changes.  ? ?The fall occurred 5 hours ago, he has no sign of basilar skull fracture, no hemotympanum, full ROM of eyes without signs of entrapment, is not on anticoagulation, no nausea/vomiting. Discussed that given he is not able to provide history and has head trauma CT is reasonable however also feel he has required significant sedation in the past and there are risks associated with that and with otherwise low clinical suspicion for ICH or clinically significant injury feel it is ok to continue to  monitor and forego CT.  Father agrees with plan at bedside.  ? ?His wound is small, superficial, will heal by secondary intention.  Tdap is UTD.  Does not appear to have other areas of tenderness.  Patient discharged in stable

## 2021-09-29 NOTE — ED Triage Notes (Signed)
Patient presented to the ed with c/o unwitnessed fall at the group home. Patient is not on blood thinner. Patient is nonverbal. Patient have small laceration to the left eyebrow and bruise. Care give at bedside.    ?

## 2021-11-12 ENCOUNTER — Encounter: Payer: Self-pay | Admitting: Adult Health

## 2021-11-12 ENCOUNTER — Ambulatory Visit (INDEPENDENT_AMBULATORY_CARE_PROVIDER_SITE_OTHER): Payer: Medicare Other | Admitting: Adult Health

## 2021-11-12 VITALS — BP 103/66 | HR 68 | Ht 60.0 in | Wt 163.2 lb

## 2021-11-12 DIAGNOSIS — G40909 Epilepsy, unspecified, not intractable, without status epilepticus: Secondary | ICD-10-CM | POA: Diagnosis not present

## 2021-11-12 DIAGNOSIS — Q909 Down syndrome, unspecified: Secondary | ICD-10-CM | POA: Diagnosis not present

## 2021-11-12 NOTE — Progress Notes (Addendum)
PATIENT: [name] DOB: 1965/11/04  REASON FOR VISIT: follow up HISTORY FROM: patient PRIMARY NEUROLOGIST: Dr. Terrace Arabia  Chief Complaint  Patient presents with   Follow-up    Pt in 7 with caregiver  pt is here for seizure follow up  Pt has developmental delay and non verbal. Pt had one fall on memorial day . Caregiver states not aware of any seizures in last 3 months caregiver states pt has involuntary movements     HISTORY OF PRESENT ILLNESS: Today 11/12/21:  [name] is a 56 year old male with a history of Down syndrome, seizures and dementia.  He returns today for follow-up.  He is currently taking Vimpat 200 mg BID. They still notice involuntary jerking. Had  a fall on memorial day.  Caregiver reports that he had a sudden jerk forward and fell.  Had some issues with urinary and bowel incontinence. Episodes are random and not associated with jerking.they now take him to the bathroom every 2 hours.  Sleeping more during the day and awake at night.    09/23/21: He is here with a caregiver. She doesn't think he has had any seizure events. She states that sometimes he will jerk forward and then will fall backwards. She describes as an aggressive sneeze except he is not sneezing. Jerks started after fall in February. Does feel that he frequency has decreased. Facility called our office in March complaining of jerking episodes.  Vimpat at that point was increased to 200 mg twice a day however looking at his paperwork today he is still taking 150 mg twice a day.  Caregiver is not aware of any reason that he was not started on 200 mg twice a day  HISTORY (copied from Dr. Zannie Cove note)  [name] is a 56 year old male, seen in request by his primary care from [name] for evaluation of increased gait abnormality, he is accompanied by staff at today's visit on July 03, 2021.     Patient has Down syndrome, has been In facility for many years,  per caregiver who has known him since 2022, he does have some regression, was noted to have increased gait abnormality, there was no recurrent seizure reported  He was seen by our clinic previously by Dr. Anne Hahn, last visit was with Aundra Millet in October 2019, long history of seizure, previously was treated with Depakote, and Keppra, reported elevated ammonia level, Depakote was switched to Vimpat, titrating dose to 150 mg twice a day now, there was also reported behavior issue with higher dose of Keppra,    Patient had hospital admission in September 2022 when she was found unresponsive, then became reactive but not himself, incontinence of urine, no seizure-like activity noted,   EEG by Dr. [name] on Feb 16 2021 This technically difficult study is consistent with patient's known history of epilepsy with generalized onset.There is also moderate diffuse encephalopathy, non etiology. No seizures were seen throughout the recording.   I personally reviewed CT head without contrast, no acute abnormality, parenchymal loss, enlarged ventricle, advanced for age  CT of cervical spine, no acute abnormality, multilevel degenerative changes  CT of maxillary showed mildly displaced fracture of left mandibular condyle,  Laboratory evaluations in September 2022, normal procalcitonin, INR, CMP with mild decreased calcium 8.4, elevated alkaline phosphate 160, albumin 2.8, normal B12, folic acid, urinalysis showed rare bacteria, Keppra level was 6.5,  He was diagnosed with acute metabolic encephalopathy, Keppra dose was increased from 250 mg twice  a day to 500 mg twice a day, remained on Vimpat 150 mg twice a day   REVIEW OF SYSTEMS: Out of a complete 14 system review of symptoms, the patient complains only of the following symptoms, and all other reviewed systems are negative.  ALLERGIES: Allergies  Allergen Reactions   Clonidine Derivatives Other (See Comments)    Causes symptomatic bradycardia with syncope and  hypotension    HOME MEDICATIONS: Outpatient Medications Prior to Visit  Medication Sig Dispense Refill   acetaminophen (TYLENOL) 650 MG CR tablet Take 650 mg by mouth 3 (three) times daily.     aluminum hydroxide-magnesium carbonate (ACID GONE) 95-358 MG/15ML SUSP Take by mouth.     Artificial Tear Ointment (LACRI-LUBE OP) Place 1 application into both eyes at bedtime.     benazepril (LOTENSIN) 10 MG tablet Take 10 mg by mouth at bedtime.     bisacodyl (DULCOLAX) 10 MG suppository Place 10 mg rectally as needed for moderate constipation.     brompheniramine-pseudoephedrine-dextromethorphan (DIMETAPP DM) 15-1-5 MG/5ML ELIX Take by mouth every 6 (six) hours as needed.     calcitonin, salmon, (MIACALCIN/FORTICAL) 200 UNIT/ACT nasal spray Place 1 spray into alternate nostrils daily.     Cholecalciferol (VITAMIN D3) 50 MCG (2000 UT) TABS Take 2,000 Units by mouth every morning.     ciclopirox (PENLAC) 8 % solution Apply 1 application topically See admin instructions. Apply topically to toenails every Wednesday evening     Cold Sore Products (BLISTEX EX) Apply topically.     Diethyltoluamide (OFF ACTIVE EX) Apply topically.     diphenhydrAMINE (BENADRYL) 25 MG tablet Take 25 mg by mouth as needed.     Docosanol 10 % CREA Apply topically.     Docosanol 10 % CREA Apply topically.     doxycycline (VIBRAMYCIN) 50 MG capsule Take 50 mg by mouth daily.     guaiFENesin (ROBITUSSIN) 100 MG/5ML liquid Take 5 mLs by mouth every 4 (four) hours as needed for cough or to loosen phlegm.     Homeopathic Products (MURINE EAR WAX RELIEF OT) Place in ear(s).     hydrocortisone cream 1 % Apply 1 application topically 2 (two) times daily.     Infant Care Products (JOHNSONS BABY SHAMPOO) SHAM Apply 1 application topically See admin instructions. Mix 1/2 water and 1/2 shampoo - use to scrub eyebrows and eyelids every morning     lacosamide (VIMPAT) 200 MG TABS tablet Take 1 tablet (200 mg total) by mouth 2 (two) times  daily. 60 tablet 3   lactulose (CHRONULAC) 10 GM/15ML solution Take 10 g by mouth every morning.     levothyroxine (SYNTHROID) 88 MCG tablet Take 88 mcg by mouth every morning.     magnesium hydroxide (MILK OF MAGNESIA) 400 MG/5ML suspension Take by mouth daily as needed for mild constipation.     Omega-3 Fatty Acids (FISH OIL) 500 MG CAPS Take 1,500 mg by mouth every morning.     promethazine (PHENERGAN) 25 MG tablet Take 25 mg by mouth as needed for nausea or vomiting.     pyridOXINE (VITAMIN B-6) 100 MG tablet Take 100 mg by mouth every morning.     Soap & Cleansers (PHISODERM EX) Apply 1 application topically 2 (two) times daily. Apply to face     Sunscreens (COPPERTONE DEFEND & CARE EX) Apply topically.     Tetrahydrozoline HCl (VISINE OP) Place 1 drop into both eyes 3 (three) times daily.     tolnaftate (TINACTIN) 1 % spray Apply  1 application topically See admin instructions. Spray topically between toes daily at bedtime     Zinc Oxide (BALMEX EX) Apply topically.     No facility-administered medications prior to visit.    PAST MEDICAL HISTORY: Past Medical History:  Diagnosis Date   Down's syndrome    GERD (gastroesophageal reflux disease)    Hypertension    Hypothyroid    Lives in group home    [facility]-[phone]-[phone]-Roxanne-nurse   Nystagmus    Obstructive sleep apnea (adult) (pediatric)    does use a cpap at night   Seizure disorder (HCC) 10/28/2016   Seizures (HCC)     PAST SURGICAL HISTORY: Past Surgical History:  Procedure Laterality Date   IRRIGATION AND DEBRIDEMENT  07/01/2014       ORIF HUMERUS FRACTURE Left 02/05/2013   Procedure: OPEN REDUCTION INTERNAL FIXATION (ORIF) MEDIAL HUMERUS CONDYLE FRACTURE;  Surgeon: Jodi Marble, MD;  Location: [facility];  Service: Orthopedics;  Laterality: Left;   Right Elbow surgery      FAMILY HISTORY: Family History  Problem Relation Age of Onset   Dementia Maternal Grandmother     Diabetes Paternal Grandfather    Heart failure Paternal Grandfather    Hypertension Paternal Grandfather    Seizures Neg Hx     SOCIAL HISTORY: Social History   Socioeconomic History   Marital status: Single    Spouse name: Not on file   Number of children: 0   Years of education: 12   Highest education level: Not on file  Occupational History   Occupation: [employer]  Tobacco Use   Smoking status: Never   Smokeless tobacco: Never  Vaping Use   Vaping Use: Never used  Substance and Sexual Activity   Alcohol use: No   Drug use: No   Sexual activity: Not Currently    Birth control/protection: None  Other Topics Concern   Not on file  Social History Narrative   Lives at a group home   Caffeine use: Tea/soda once per week   Right handed   Social Determinants of Health   Financial Resource Strain: Not on file  Food Insecurity: Not on file  Transportation Needs: Not on file  Physical Activity: Not on file  Stress: Not on file  Social Connections: Not on file  Intimate Partner Violence: Not on file      PHYSICAL EXAM  Vitals:   11/12/21 0916  BP: 103/66  Pulse: 68  Weight: 163 lb 3.2 oz (74 kg)  Height: 5' (1.524 m)   Body mass index is 31.87 kg/m.  Generalized: Well developed  Neurological examination  Mentation: Alert.  Nonverbal Cranial nerve II-XII:  Extraocular movements were full, visual field were full on confrontational test. Facial sensation and strength were normal. Head turning and shoulder shrug  were normal and symmetric. Motor: The motor testing reveals 5 over 5 strength of all 4 extremities.  Coordination: Unable to complete.  Gait and station: Ambulates with assistant from caregiver.  Has on a safety vest.  Gait is unstable.  DIAGNOSTIC DATA (LABS, IMAGING, TESTING) - I reviewed patient records, labs, notes, testing and imaging myself where available.  Lab Results  Component Value Date   WBC 5.0 07/03/2021   HGB 14.2 07/03/2021   HCT  41.8 07/03/2021   MCV 100 (H) 07/03/2021   PLT 267 07/03/2021      Component Value Date/Time   NA 141 07/03/2021 1105   K 4.3 07/03/2021 1105   CL 101  07/03/2021 1105   CO2 22 07/03/2021 1105   GLUCOSE 94 07/03/2021 1105   GLUCOSE 105 (H) 02/18/2021 0747   BUN 15 07/03/2021 1105   CREATININE 0.93 07/03/2021 1105   CALCIUM 9.1 07/03/2021 1105   PROT 7.1 07/03/2021 1105   ALBUMIN 4.1 07/03/2021 1105   AST 36 07/03/2021 1105   ALT 27 07/03/2021 1105   ALKPHOS 118 07/03/2021 1105   BILITOT 0.5 07/03/2021 1105   GFRNONAA >60 02/18/2021 0747   GFRAA >60 02/23/2018 0945    Lab Results  Component Value Date   VITAMINB12 297 02/15/2021   Lab Results  Component Value Date   TSH 0.748 07/03/2021      ASSESSMENT AND PLAN 56 y.o. year old male  has a past medical history of Down's syndrome, GERD (gastroesophageal reflux disease), Hypertension, Hypothyroid, Lives in group home, Nystagmus, Obstructive sleep apnea (adult) (pediatric), Seizure disorder (HCC) (10/28/2016), and Seizures (HCC). here with:  1.  Down syndrome 2.  Seizures  - Continue Vimpat 200 mg BID  -Patient's gait is unstable should have assistance when ambulating at all times -Patient's falls and gait are multifactoral.  He most likely has dementia associated with Down syndrome.  -Encouraged the patient caregiver to try to keep him as safe as possible in regards to following -Follow-up in 6 months or sooner if needed      [name], MSN, NP-C 11/12/2021, 9:40 AM [facility] [address] [place], [place] [address] [phone] Addendum: Vimpat level was elevated in February 23, 13.4, is likely not a trough level, but to with his increased gait abnormality, may consider lower dose of Vimpat, from history, his seizure seems to be under good control  May consider Vimpat 100 mg / 200 mg

## 2021-12-30 ENCOUNTER — Ambulatory Visit: Payer: Medicare Other | Admitting: Adult Health

## 2022-01-04 ENCOUNTER — Other Ambulatory Visit: Payer: Self-pay | Admitting: Adult Health

## 2022-01-26 NOTE — Progress Notes (Signed)
Discussed with Dr. Terrace Arabia we will decrease his dose of Vimpat 100 mg in the morning and 200 mg in the evening if his symptoms worsen they should let us know and we can increase the dose back if needed

## 2022-02-03 ENCOUNTER — Telehealth: Payer: Self-pay | Admitting: *Deleted

## 2022-02-03 NOTE — Telephone Encounter (Signed)
I called [facility] for [name], med/RN for residents.  Wanted to confirm lacosamide.vimpat dosing for pt.

## 2022-02-04 NOTE — Telephone Encounter (Signed)
I called [facility] and gave another message to return call about dose of pt lacosamide/vimpat.

## 2022-02-08 ENCOUNTER — Telehealth: Payer: Self-pay | Admitting: *Deleted

## 2022-02-08 NOTE — Telephone Encounter (Signed)
I sent mychart message to family member, father.  He called back, I relayed was trying to reach caregiver about his medication lacosamide dosing.  He gave me 2 numbers to call:    [phone] Melissa,  I called and LMVM.  The other # [phone] night call, Betsy.  She was able to look up pts information.  Lacosamide 200mg  po bid.  She recommended speaking to Tameka RN at [facility] to see how pt is doing. She will relay message.  I will try again tomorrow.

## 2022-02-10 ENCOUNTER — Other Ambulatory Visit: Payer: Self-pay | Admitting: *Deleted

## 2022-02-10 NOTE — Telephone Encounter (Signed)
I called and spoke to [facility]. Relayed that MM/NP saw pt back in 10-2021, and Dr. Terrace Arabia replied that the dosing of vimpat may be decreased to 100mg  po am abd 200mg  po pm and see if may help relating to falls/balance issues pt is having.  He is having no seizures.  Will send to pharmacy and also fax her it as well at [facility] at [phone].

## 2022-02-11 ENCOUNTER — Telehealth: Payer: Self-pay | Admitting: *Deleted

## 2022-02-11 MED ORDER — LACOSAMIDE 100 MG PO TABS: ORAL_TABLET | ORAL | 5 refills | Status: DC

## 2022-02-11 NOTE — Telephone Encounter (Signed)
Received a fax from [place] stating Lacosamide has been approved through 05/30/22. Approval letter faxed to pharmacy, Sherlie Ban. Received a receipt of confirmation.

## 2022-02-11 NOTE — Telephone Encounter (Signed)
Fax confirmation received of copy of receipt to [facility] pharmacy on pts vimpat to Rozann Lesches RN at [facility].

## 2022-05-20 ENCOUNTER — Other Ambulatory Visit: Payer: Self-pay | Admitting: Family Medicine

## 2022-05-20 ENCOUNTER — Ambulatory Visit
Admission: RE | Admit: 2022-05-20 | Discharge: 2022-05-20 | Disposition: A | Discharge status: Home / Self Care | Payer: Medicare Other | Source: Ambulatory Visit | Attending: Family Medicine | Admitting: Family Medicine

## 2022-05-20 DIAGNOSIS — M7989 Other specified soft tissue disorders: Secondary | ICD-10-CM

## 2022-06-03 ENCOUNTER — Ambulatory Visit (INDEPENDENT_AMBULATORY_CARE_PROVIDER_SITE_OTHER): Payer: Medicare Other | Admitting: Adult Health

## 2022-06-03 ENCOUNTER — Encounter: Payer: Self-pay | Admitting: Adult Health

## 2022-06-03 VITALS — BP 113/65 | HR 52 | Wt 169.6 lb

## 2022-06-03 DIAGNOSIS — G40909 Epilepsy, unspecified, not intractable, without status epilepticus: Secondary | ICD-10-CM | POA: Diagnosis not present

## 2022-06-03 DIAGNOSIS — Q909 Down syndrome, unspecified: Secondary | ICD-10-CM | POA: Diagnosis not present

## 2022-06-03 NOTE — Progress Notes (Signed)
PATIENT: [name] DOB: 11/02/1965  REASON FOR VISIT: follow up HISTORY FROM: patient PRIMARY NEUROLOGIST: Dr. Krista Blue  Chief Complaint  Patient presents with   Follow-up    Rm 4,  with caregivers.  [facility].  Involuntary jerks, sporadic (last several seconds  February 2023.  Drowsy, offbalance.     HISTORY OF PRESENT ILLNESS: Today 06/03/22:  Mr. [name] is a 57 year old male with a history of Down syndrome, seizures and dementia.  He returns today for follow-up.  At the last visit we decreased Vimpat to 100 mg in the morning and 200 mg at night due to involuntary jerking and gait disturbance.  Caregivers report that with the decrease in Vimpat involuntary jerking and gait disturbance did not improve or worsen.  Things have remained relatively stable.  They do have to be very cautious with his gait in order to prevent falls.  He returns today for an evaluation  11/12/21: Mr. [name] is a 57 year old male with a history of Down syndrome, seizures and dementia.  He returns today for follow-up.  He is currently taking Vimpat 200 mg BID. They still notice involuntary jerking. Had  a fall on memorial day.  Caregiver reports that he had a sudden jerk forward and fell.  Had some issues with urinary and bowel incontinence. Episodes are random and not associated with jerking.they now take him to the bathroom every 2 hours.  Sleeping more during the day and awake at night.    09/23/21: He is here with a caregiver. She doesn't think he has had any seizure events. She states that sometimes he will jerk forward and then will fall backwards. She describes as an aggressive sneeze except he is not sneezing. Jerks started after fall in February. Does feel that he frequency has decreased. Facility called our office in March complaining of jerking episodes.  Vimpat at that point was increased to 200 mg twice a day however looking at his paperwork today he is still taking 150 mg twice a day.   Caregiver is not aware of any reason that he was not started on 200 mg twice a day  HISTORY (copied from Dr. Rhea Belton note)  KINDRED [name] is a 57 year old male, seen in request by his primary care from [name] [place] [facility] provider for evaluation of increased gait abnormality, he is accompanied by staff at today's visit on July 03, 2021.     Patient has Down syndrome, has been In facility for many years, per caregiver who has known him since 2022, he does have some regression, was noted to have increased gait abnormality, there was no recurrent seizure reported  He was seen by our clinic previously by Dr. Jannifer Cline, last visit was with [name] in October 2019, long history of seizure, previously was treated with Depakote, and Keppra, reported elevated ammonia level, Depakote was switched to Vimpat, titrating dose to 150 mg twice a day now, there was also reported behavior issue with higher dose of Keppra,    Patient had hospital admission in September 2022 when she was found unresponsive, then became reactive but not himself, incontinence of urine, no seizure-like activity noted,   EEG by Dr. Hortense Ramal on Feb 16 2021 This technically difficult study is consistent with patient's known history of epilepsy with generalized onset.There is also moderate diffuse encephalopathy, non etiology. No seizures were seen throughout the recording.   I personally reviewed CT head without contrast, no acute abnormality, parenchymal loss, enlarged ventricle, advanced for  age  CT of cervical spine, no acute abnormality, multilevel degenerative changes  CT of maxillary showed mildly displaced fracture of left mandibular condyle,  Laboratory evaluations in September 2022, normal procalcitonin, INR, CMP with mild decreased calcium 8.4, elevated alkaline phosphate 160, albumin 2.8, normal Z16, folic acid, urinalysis showed rare bacteria, Keppra level was 6.5,  He was diagnosed with acute metabolic  encephalopathy, Keppra dose was increased from 250 mg twice a day to 500 mg twice a day, remained on Vimpat 150 mg twice a day   REVIEW OF SYSTEMS: Out of a complete 14 system review of symptoms, the patient complains only of the following symptoms, and all other reviewed systems are negative.  ALLERGIES: Allergies  Allergen Reactions   Clonidine Derivatives Other (See Comments)    Causes symptomatic bradycardia with syncope and hypotension    HOME MEDICATIONS: Outpatient Medications Prior to Visit  Medication Sig Dispense Refill   acetaminophen (TYLENOL) 160 MG/5ML solution Take 20 mLs by mouth every 4 (four) hours as needed.     acetaminophen (TYLENOL) 650 MG CR tablet Take 650 mg by mouth 3 (three) times daily.     Artificial Tear Ointment (LACRI-LUBE OP) Place 1 application into both eyes at bedtime.     atorvastatin (LIPITOR) 10 MG tablet Take 10 mg by mouth daily.     benazepril (LOTENSIN) 10 MG tablet Take 10 mg by mouth at bedtime.     bisacodyl (DULCOLAX) 10 MG suppository Place 10 mg rectally as needed for moderate constipation.     brompheniramine-pseudoephedrine-dextromethorphan (DIMETAPP DM) 15-1-5 MG/5ML ELIX Take by mouth every 6 (six) hours as needed.     calcitonin, salmon, (MIACALCIN/FORTICAL) 200 UNIT/ACT nasal spray Place 1 spray into alternate nostrils daily.     Cholecalciferol (VITAMIN D3) 50 MCG (2000 UT) TABS Take 2,000 Units by mouth every morning.     ciclopirox (PENLAC) 8 % solution Apply 1 application topically See admin instructions. Apply topically to toenails every Wednesday evening     Cold Sore Products (BLISTEX EX) Apply topically.     Diethyltoluamide (OFF ACTIVE EX) Apply topically.     diphenhydrAMINE (BENADRYL) 25 MG tablet Take 25 mg by mouth as needed.     Docosanol 10 % CREA Apply topically.     doxycycline (VIBRAMYCIN) 50 MG capsule Take 50 mg by mouth daily.     guaiFENesin (ROBITUSSIN) 100 MG/5ML liquid Take 5 mLs by mouth every 4 (four) hours  as needed for cough or to loosen phlegm.     Homeopathic Products (MURINE EAR WAX RELIEF OT) Place in ear(s).     hydrocortisone cream 1 % Apply 1 application topically 2 (two) times daily.     Infant Care Products (JOHNSONS BABY SHAMPOO) SHAM Apply 1 application topically See admin instructions. Mix 1/2 water and 1/2 shampoo - use to scrub eyebrows and eyelids every morning     Lacosamide (VIMPAT) 100 MG TABS Take 100mg  in AM and 200mg  in PM. 90 tablet 5   lacosamide (VIMPAT) 200 MG TABS tablet (CONTROL CYCLE) TAKE 1 TABLET BY MOUTH TWICE DAILY FOR SEIZURES 60 tablet 4   lactulose (CHRONULAC) 10 GM/15ML solution Take 10 g by mouth every morning.     levothyroxine (SYNTHROID) 88 MCG tablet Take 88 mcg by mouth every morning.     loperamide (IMODIUM A-D) 2 MG tablet Take 4 mg by mouth as needed for diarrhea or loose stools.     magnesium hydroxide (MILK OF MAGNESIA) 400 MG/5ML suspension Take by mouth  daily as needed for mild constipation.     Omega-3 Fatty Acids (FISH OIL) 500 MG CAPS Take 1,500 mg by mouth every morning.     promethazine (PHENERGAN) 25 MG tablet Take 25 mg by mouth as needed for nausea or vomiting.     pyridOXINE (VITAMIN B-6) 100 MG tablet Take 100 mg by mouth every morning.     rivastigmine (EXELON) 4.6 mg/24hr Place 4.6 mg onto the skin daily.     Soap & Cleansers (PHISODERM EX) Apply 1 application topically 2 (two) times daily. Apply to face     Sunscreens (COPPERTONE DEFEND & CARE EX) Apply topically.     Tetrahydrozoline HCl (VISINE OP) Place 1 drop into both eyes 3 (three) times daily.     tolnaftate (TINACTIN) 1 % spray Apply 1 application topically See admin instructions. Spray topically between toes daily at bedtime     Zinc Oxide (BALMEX EX) Apply topically.     Docosanol 10 % CREA Apply topically.     aluminum hydroxide-magnesium carbonate (ACID GONE) 95-358 MG/15ML SUSP Take by mouth. (Patient not taking: Reported on 06/03/2022)     No facility-administered  medications prior to visit.    PAST MEDICAL HISTORY: Past Medical History:  Diagnosis Date   Down's syndrome    GERD (gastroesophageal reflux disease)    Hypertension    Hypothyroid    Lives in group home    [facility]-[phone]-Roxanne-nurse   Nystagmus    Obstructive sleep apnea (adult) (pediatric)    does use a cpap at night   Seizure disorder (HCC) 10/28/2016   Seizures (HCC)     PAST SURGICAL HISTORY: Past Surgical History:  Procedure Laterality Date   IRRIGATION AND DEBRIDEMENT  07/01/2014       ORIF HUMERUS FRACTURE Left 02/05/2013   Procedure: OPEN REDUCTION INTERNAL FIXATION (ORIF) MEDIAL HUMERUS CONDYLE FRACTURE;  Surgeon: Jodi Marble, MD;  Location: [facility];  Service: Orthopedics;  Laterality: Left;   Right Elbow surgery      FAMILY HISTORY: Family History  Problem Relation Age of Onset   Dementia Maternal Grandmother    Diabetes Paternal Grandfather    Heart failure Paternal Grandfather    Hypertension Paternal Grandfather    Seizures Neg Hx     SOCIAL HISTORY: Social History   Socioeconomic History   Marital status: Single    Spouse name: Not on file   Number of children: 0   Years of education: 12   Highest education level: Not on file  Occupational History   Occupation: [employer]  Tobacco Use   Smoking status: Never   Smokeless tobacco: Never  Vaping Use   Vaping Use: Never used  Substance and Sexual Activity   Alcohol use: No   Drug use: No   Sexual activity: Not Currently    Birth control/protection: None  Other Topics Concern   Not on file  Social History Narrative   Lives at a group home   Caffeine use: Tea/soda once per week   Right handed   Social Determinants of Health   Financial Resource Strain: Not on file  Food Insecurity: Not on file  Transportation Needs: Not on file  Physical Activity: Not on file  Stress: Not on file  Social Connections: Not on file  Intimate Partner  Violence: Not on file      PHYSICAL EXAM  Vitals:   06/03/22 1054  BP: 113/65  Pulse: (!) 52  Weight: 169 lb 9.6 oz (76.9 kg)  Body mass index is 33.12 kg/m.  Generalized: Well developed  Neurological examination  Mentation: Alert.  Nonverbal Cranial nerve II-XII:  Extraocular movements were full, visual field were full on confrontational test. Facial sensation and strength were normal. Head turning and shoulder shrug  were normal and symmetric. Motor: The motor testing reveals 5 over 5 strength of all 4 extremities.  Coordination: Unable to complete.  Gait and station: In a wheelchair today  DIAGNOSTIC DATA (LABS, IMAGING, TESTING) - I reviewed patient records, labs, notes, testing and imaging myself where available.  Lab Results  Component Value Date   WBC 5.0 07/03/2021   HGB 14.2 07/03/2021   HCT 41.8 07/03/2021   MCV 100 (H) 07/03/2021   PLT 267 07/03/2021      Component Value Date/Time   NA 141 07/03/2021 1105   K 4.3 07/03/2021 1105   CL 101 07/03/2021 1105   CO2 22 07/03/2021 1105   GLUCOSE 94 07/03/2021 1105   GLUCOSE 105 (H) 02/18/2021 0747   BUN 15 07/03/2021 1105   CREATININE 0.93 07/03/2021 1105   CALCIUM 9.1 07/03/2021 1105   PROT 7.1 07/03/2021 1105   ALBUMIN 4.1 07/03/2021 1105   AST 36 07/03/2021 1105   ALT 27 07/03/2021 1105   ALKPHOS 118 07/03/2021 1105   BILITOT 0.5 07/03/2021 1105   GFRNONAA >60 02/18/2021 0747   GFRAA >60 02/23/2018 0945    Lab Results  Component Value Date   VITAMINB12 297 02/15/2021   Lab Results  Component Value Date   TSH 0.748 07/03/2021      ASSESSMENT AND PLAN 57 y.o. year old male  has a past medical history of Down's syndrome, GERD (gastroesophageal reflux disease), Hypertension, Hypothyroid, Lives in group home, Nystagmus, Obstructive sleep apnea (adult) (pediatric), Seizure disorder (HCC) (10/28/2016), and Seizures (HCC). here with:  1.  Down syndrome 2.  Seizures  - Continue Vimpat 100 mg in  the morning and 200 mg at night -Continue to monitor symptoms -Encouraged the patient caregiver to try to keep him as safe as possible in regards to falling -Follow-up in 6 months or sooner if needed      [name], MSN, NP-C 06/03/2022, 11:20 AM [facility] [address], [address] [place], [place] [address] [phone]

## 2022-06-12 ENCOUNTER — Other Ambulatory Visit: Payer: Self-pay

## 2022-06-12 ENCOUNTER — Emergency Department (HOSPITAL_COMMUNITY): Payer: Medicare Other

## 2022-06-12 ENCOUNTER — Emergency Department (HOSPITAL_COMMUNITY)
Admission: EM | Admit: 2022-06-12 | Discharge: 2022-06-12 | Disposition: A | Discharge status: Home / Self Care | Payer: Medicare Other | Attending: Emergency Medicine | Admitting: Emergency Medicine

## 2022-06-12 ENCOUNTER — Encounter (HOSPITAL_COMMUNITY): Payer: Self-pay | Admitting: Emergency Medicine

## 2022-06-12 DIAGNOSIS — S060X0A Concussion without loss of consciousness, initial encounter: Secondary | ICD-10-CM | POA: Diagnosis not present

## 2022-06-12 DIAGNOSIS — S0181XA Laceration without foreign body of other part of head, initial encounter: Secondary | ICD-10-CM | POA: Insufficient documentation

## 2022-06-12 DIAGNOSIS — W01198A Fall on same level from slipping, tripping and stumbling with subsequent striking against other object, initial encounter: Secondary | ICD-10-CM | POA: Insufficient documentation

## 2022-06-12 MED ORDER — LIDOCAINE-EPINEPHRINE-TETRACAINE (LET) TOPICAL GEL
3.0000 mL | Freq: Once | TOPICAL | Status: AC
  Administered 2022-06-12: 3 mL via TOPICAL
  Filled 2022-06-12: qty 3

## 2022-06-12 MED ORDER — BACITRACIN ZINC 500 UNIT/GM EX OINT
TOPICAL_OINTMENT | CUTANEOUS | Status: AC
  Filled 2022-06-12: qty 0.9

## 2022-06-12 MED ORDER — LIDOCAINE-EPINEPHRINE (PF) 2 %-1:200000 IJ SOLN
20.0000 mL | Freq: Once | INTRAMUSCULAR | Status: AC
  Administered 2022-06-12: 20 mL
  Filled 2022-06-12: qty 20

## 2022-06-12 MED ORDER — BACITRACIN ZINC 500 UNIT/GM EX OINT: TOPICAL_OINTMENT | Freq: Once | CUTANEOUS | Status: DC

## 2022-06-12 MED ORDER — LIDOCAINE-EPINEPHRINE (PF) 2 %-1:200000 IJ SOLN
20.0000 mL | Freq: Once | INTRAMUSCULAR | Status: DC
  Filled 2022-06-12: qty 20

## 2022-06-12 NOTE — ED Notes (Signed)
Bacitracin ointment and bandage applied to forehead

## 2022-06-12 NOTE — ED Triage Notes (Signed)
Pt BIB EMS from group home after mechanical fall. Per staff, pt witnessed falling forward on ceramic vanity. Small lac noted on forehead. Nonverbal at baseline. No LOC, no thinners. Pt alert and tracking, hx down syndrome.   BP 168/84 P 80 RR 18 spO2 98%

## 2022-06-12 NOTE — ED Provider Notes (Addendum)
[name]-EMERGENCY DEPT Provider Note   CSN: [id number] Arrival date & time: 06/12/22  1542     History  Chief Complaint  Patient presents with  . Fall  . Laceration    [name] is a 57 y.o. male.  HPI    Level 5 caveat for ID secondary to Down syndrome.  Patient is a 57 year old male who comes in with chief complaint of fall and laceration.  He resides at group home.  Group home supervisor is at the bedside.  She states that they were changing his clothes when the patient had sudden jerking movement which led to him losing balance and falling forward.  In the process he struck his head or a sink and started bleeding.  Patient has a deep laceration to his forehead.  There was no loss of consciousness.  Patient has been sleepy since the incident but there has not been any nausea, vomiting and his behavior is appearing to be at baseline.  At baseline patient is mostly nonverbal and will not be able to articulate his thoughts clearly.  Patient is not on any blood thinners.   Home Medications Prior to Admission medications   Medication Sig Start Date End Date Taking? Authorizing Provider  acetaminophen (TYLENOL) 160 MG/5ML solution Take 20 mLs by mouth every 4 (four) hours as needed.    [provider]  acetaminophen (TYLENOL) 650 MG CR tablet Take 650 mg by mouth 3 (three) times daily.    [provider]  aluminum hydroxide-magnesium carbonate (ACID GONE) 95-358 MG/15ML SUSP Take by mouth. Patient not taking: Reported on 06/03/2022    [provider]  Artificial Tear Ointment (LACRI-LUBE OP) Place 1 application into both eyes at bedtime.    [provider]  atorvastatin (LIPITOR) 10 MG tablet Take 10 mg by mouth daily.    [provider]  benazepril (LOTENSIN) 10 MG tablet Take 10 mg by mouth at bedtime.    [provider]  bisacodyl (DULCOLAX) 10 MG suppository Place 10 mg rectally as needed for  moderate constipation.    [provider]  brompheniramine-pseudoephedrine-dextromethorphan (DIMETAPP DM) 15-1-5 MG/5ML ELIX Take by mouth every 6 (six) hours as needed.    [provider]  calcitonin, salmon, (MIACALCIN/FORTICAL) 200 UNIT/ACT nasal spray Place 1 spray into alternate nostrils daily.    [provider]  Cholecalciferol (VITAMIN D3) 50 MCG (2000 UT) TABS Take 2,000 Units by mouth every morning.    [provider]  ciclopirox (PENLAC) 8 % solution Apply 1 application topically See admin instructions. Apply topically to toenails every Wednesday evening 01/08/21   [provider]  Cold Sore Products (BLISTEX EX) Apply topically.    [provider]  Diethyltoluamide (OFF ACTIVE EX) Apply topically.    [provider]  diphenhydrAMINE (BENADRYL) 25 MG tablet Take 25 mg by mouth as needed.    [provider]  Docosanol 10 % CREA Apply topically.    [provider]  doxycycline (VIBRAMYCIN) 50 MG capsule Take 50 mg by mouth daily.    [provider]  guaiFENesin (ROBITUSSIN) 100 MG/5ML liquid Take 5 mLs by mouth every 4 (four) hours as needed for cough or to loosen phlegm.    [provider]  Homeopathic Products (MURINE EAR WAX RELIEF OT) Place in ear(s).    [provider]  hydrocortisone cream 1 % Apply 1 application topically 2 (two) times daily.    [provider]  Infant Care Products (  JOHNSONS BABY SHAMPOO) SHAM Apply 1 application topically See admin instructions. Mix 1/2 water and 1/2 shampoo - use to scrub eyebrows and eyelids every morning    [provider]  Lacosamide (VIMPAT) 100 MG TABS Take 100mg  in AM and 200mg  in PM. 02/11/22   Ward Givens, NP  lacosamide (VIMPAT) 200 MG TABS tablet (CONTROL CYCLE) TAKE 1 TABLET BY MOUTH TWICE DAILY FOR SEIZURES 01/05/22   Ward Givens, NP  lactulose (CHRONULAC) 10 GM/15ML solution Take 10 g by mouth every morning.     [provider]  levothyroxine (SYNTHROID) 88 MCG tablet Take 88 mcg by mouth every morning. 01/16/21   [provider]  loperamide (IMODIUM A-D) 2 MG tablet Take 4 mg by mouth as needed for diarrhea or loose stools.    [provider]  magnesium hydroxide (MILK OF MAGNESIA) 400 MG/5ML suspension Take by mouth daily as needed for mild constipation.    [provider]  Omega-3 Fatty Acids (FISH OIL) 500 MG CAPS Take 1,500 mg by mouth every morning.    [provider]  promethazine (PHENERGAN) 25 MG tablet Take 25 mg by mouth as needed for nausea or vomiting.    [provider]  pyridOXINE (VITAMIN B-6) 100 MG tablet Take 100 mg by mouth every morning.    [provider]  rivastigmine (EXELON) 4.6 mg/24hr Place 4.6 mg onto the skin daily.    [provider]  Soap & Cleansers (PHISODERM EX) Apply 1 application topically 2 (two) times daily. Apply to face    [provider]  Sunscreens (COPPERTONE DEFEND & CARE EX) Apply topically.    [provider]  Tetrahydrozoline HCl (VISINE OP) Place 1 drop into both eyes 3 (three) times daily.    [provider]  tolnaftate (TINACTIN) 1 % spray Apply 1 application topically See admin instructions. Spray topically between toes daily at bedtime    [provider]  Zinc Oxide (BALMEX EX) Apply topically.    [provider]      Allergies    Clonidine derivatives    Review of Systems   Review of Systems  Physical Exam Updated Vital Signs BP 135/88   Pulse 73   Temp 98.6 F (37 C)   Resp 14   SpO2 98%  Physical Exam Vitals and nursing note reviewed.  Constitutional:      General: He is not in acute distress.    Appearance: He is well-developed.  HENT:     Head:     Comments: Patient has about 5 cm deep, horizontal, gaping laceration over the middle of the forehead Cardiovascular:     Rate and Rhythm: Normal rate.  Pulmonary:      Effort: Pulmonary effort is normal.  Musculoskeletal:        General: No tenderness or deformity.  Neurological:     Mental Status: He is oriented to person, place, and time.     ED Results / Procedures / Treatments   Labs (all labs ordered are listed, but only abnormal results are displayed) Labs Reviewed - No data to display  EKG None  Radiology CT Head Wo Contrast  Result Date: 06/12/2022 CLINICAL DATA:  Head trauma, moderate-severe.  Fall. EXAM: CT HEAD WITHOUT CONTRAST TECHNIQUE: Contiguous axial images were obtained from the base of the skull through the vertex without intravenous contrast. RADIATION DOSE REDUCTION: This exam was performed according to the departmental dose-optimization program which includes automated exposure control, adjustment of the mA and/or  kV according to patient size and/or use of iterative reconstruction technique. COMPARISON:  08/01/2021 FINDINGS: Brain: There is atrophy and chronic small vessel disease changes. No acute intracranial abnormality. Specifically, no hemorrhage, hydrocephalus, mass lesion, acute infarction, or significant intracranial injury. Vascular: No hyperdense vessel or unexpected calcification. Skull: No acute calvarial abnormality. Sinuses/Orbits: No acute findings Other: Scalp laceration in the forehead. IMPRESSION: Atrophy, chronic microvascular disease. No acute intracranial abnormality. Electronically Signed   By: Rolm Baptise M.D.   On: 06/12/2022 20:12    Procedures Procedures    Medications Ordered in ED Medications  lidocaine-EPINEPHrine-tetracaine (LET) topical gel (3 mLs Topical Given 06/12/22 1913)  lidocaine-EPINEPHrine (XYLOCAINE W/EPI) 2 %-1:200000 (PF) injection 20 mL (20 mLs Infiltration Given 06/12/22 2041)    ED Course/ Medical Decision Making/ A&P Clinical Course as of 06/12/22 2129  Sat Jun 12, 2022  2122 5cm lac, 5 stitches 4-0 chromic gut [CP]  2128 CT Head Wo Contrast Independently interpreted, no evidence  of brain bleed. [AN]  2128 CT Cervical Spine Wo Contrast CT C-spine is equivocal because of artifact.  Patient reassessed.  He is moving all 4 extremities.  Discussed with the mother that the CT scan is limited because of artifact secondary to movement.  It is nondiagnostic.  However, patient is moving both upper extremities, lower extremities and his head without issue, therefore our overall suspicion for concerning cervical spine injury is low.  We will advised that family checks on his motor and sensory abilities tomorrow, if there is any discrepancy, then they need to bring him back to the emergency room.  Mother is comfortable with this plan.  She was at the bedside, trying to keep him calm for the CT, but understands the limitation.  She is comfortable not getting repeat imaging under sedation. [AN]    Clinical Course User Index [AN] Varney Biles, MD [CP] Anselmo Pickler, PA-C                             Medical Decision Making 57 year old male comes to the ER with chief complaint of fall. Mechanical fall w/ laceration to the forehead that will need suture repair.  Differential diagnosis considered for this patient includes subdural hematoma, cervical spine fracture, hematoma, concussion.  Patient is not on any blood thinners.  CT scan of the head, C-spine ordered.  Patient has past history of Down syndrome and intellectual disability secondary to it.  I spoke with patient's father.  He states that in the past they have been able to get CT scan of the brain, C-spine and even laceration repair with let gel and other calming measures.  They would ideally like to avoid procedural sedation.  CT scan of the brain, C-spine ordered.  CT brain independently interpreted, there is no clear evidence of brain bleed.  CT C-spine read is pending.  Let gel has been applied to the wound.  APP to try and repair.  Problems Addressed: Concussion without loss of consciousness, initial encounter:  acute illness or injury Facial laceration, initial encounter: acute illness or injury  Amount and/or Complexity of Data Reviewed Radiology: ordered.  Risk Prescription drug management.    Final Clinical Impression(s) / ED Diagnoses Final diagnoses:  Facial laceration, initial encounter  Concussion without loss of consciousness, initial encounter    Rx / DC Orders ED Discharge Orders     None         Varney Biles, MD 06/12/22 2038  Varney Biles, MD 06/12/22 2129

## 2022-06-12 NOTE — ED Provider Notes (Signed)
..  Laceration Repair  Date/Time: 06/12/2022 9:34 PM  Performed by: Anselmo Pickler, PA-C Authorized by: Anselmo Pickler, PA-C   Consent:    Consent obtained:  Verbal   Consent given by:  Parent and guardian   Risks, benefits, and alternatives were discussed: yes     Risks discussed:  Infection, pain, need for additional repair, nerve damage, poor wound healing and poor cosmetic result   Alternatives discussed:  No treatment Universal protocol:    Procedure explained and questions answered to patient or proxy's satisfaction: yes     Patient identity confirmed:  Hospital-assigned identification number and arm band Anesthesia:    Anesthesia method:  Topical application and local infiltration   Topical anesthetic:  LET   Local anesthetic:  Lidocaine 2% WITH epi Laceration details:    Location:  Face   Face location:  Forehead   Length (cm):  5.1   Depth (mm):  5 Treatment:    Area cleansed with:  Shur-Clens and saline Skin repair:    Repair method:  Sutures   Suture size:  4-0   Suture material:  Chromic gut   Suture technique:  Simple interrupted   Number of sutures:  5 Approximation:    Approximation:  Close Repair type:    Repair type:  Simple Post-procedure details:    Dressing:  Antibiotic ointment and non-adherent dressing   Procedure completion:  Avrum, Kimball, PA-C 06/12/22 2136    Varney Biles, MD 06/13/22 1323

## 2022-06-12 NOTE — Discharge Instructions (Addendum)
Mr. [name] CT scan of the brain is not revealing any evidence of brain bleed. Unfortunately, the CT scan of his cervical spine is nondiagnostic given the artifact from movement.  However, Mr. [name] is moving his arms and legs and also his head without problem, therefore suspicion for concerning cervical spine injury is low.  Please make sure that he is able to function at his normal capacity as far as extremity use is concerned.  If he cannot stand up, there is profound one-sided weakness or you start appreciating sensory disturbance, please bring him back to the emergency room.  The laceration is repaired. Keep the wound clean and dry. The sutures that were placed will dissolve on their own within 2 weeks.

## 2022-06-25 ENCOUNTER — Other Ambulatory Visit: Payer: Self-pay

## 2022-06-25 ENCOUNTER — Encounter (HOSPITAL_COMMUNITY): Payer: Self-pay

## 2022-06-25 ENCOUNTER — Emergency Department (HOSPITAL_COMMUNITY)
Admission: EM | Admit: 2022-06-25 | Discharge: 2022-06-25 | Disposition: A | Discharge status: Home / Self Care | Payer: Medicare Other | Attending: Emergency Medicine | Admitting: Emergency Medicine

## 2022-06-25 ENCOUNTER — Telehealth: Payer: Self-pay | Admitting: Neurology

## 2022-06-25 DIAGNOSIS — R569 Unspecified convulsions: Secondary | ICD-10-CM

## 2022-06-25 DIAGNOSIS — Z4802 Encounter for removal of sutures: Secondary | ICD-10-CM | POA: Diagnosis not present

## 2022-06-25 DIAGNOSIS — G40909 Epilepsy, unspecified, not intractable, without status epilepticus: Secondary | ICD-10-CM | POA: Diagnosis not present

## 2022-06-25 DIAGNOSIS — R32 Unspecified urinary incontinence: Secondary | ICD-10-CM | POA: Diagnosis not present

## 2022-06-25 LAB — COMPREHENSIVE METABOLIC PANEL
ALT: 28 U/L (ref 0–44)
AST: 30 U/L (ref 15–41)
Albumin: 3.4 g/dL — ABNORMAL LOW (ref 3.5–5.0)
Alkaline Phosphatase: 90 U/L (ref 38–126)
Anion gap: 10 (ref 5–15)
BUN: 14 mg/dL (ref 6–20)
CO2: 25 mmol/L (ref 22–32)
Calcium: 8.9 mg/dL (ref 8.9–10.3)
Chloride: 102 mmol/L (ref 98–111)
Creatinine, Ser: 0.97 mg/dL (ref 0.61–1.24)
GFR, Estimated: >60 mL/min (ref >60)
Glucose, Bld: 91 mg/dL (ref 70–99)
Potassium: 4.1 mmol/L (ref 3.5–5.1)
Sodium: 137 mmol/L (ref 135–145)
Total Bilirubin: 0.6 mg/dL (ref 0.3–1.2)
Total Protein: 7.1 g/dL (ref 6.5–8.1)

## 2022-06-25 LAB — CBC WITH DIFFERENTIAL/PLATELET
Abs Immature Granulocytes: 0.06 10*3/uL (ref 0.00–0.07)
Basophils Absolute: 0 10*3/uL (ref 0.0–0.1)
Basophils Relative: 0 %
Eosinophils Absolute: 0 10*3/uL (ref 0.0–0.5)
Eosinophils Relative: 0 %
HCT: 39.9 % (ref 39.0–52.0)
Hemoglobin: 13.6 g/dL (ref 13.0–17.0)
Immature Granulocytes: 1 %
Lymphocytes Relative: 33 %
Lymphs Abs: 2.3 10*3/uL (ref 0.7–4.0)
MCH: 34.8 pg — ABNORMAL HIGH (ref 26.0–34.0)
MCHC: 34.1 g/dL (ref 30.0–36.0)
MCV: 102 fL — ABNORMAL HIGH (ref 80.0–100.0)
Monocytes Absolute: 0.5 10*3/uL (ref 0.1–1.0)
Monocytes Relative: 7 %
Neutro Abs: 3.9 10*3/uL (ref 1.7–7.7)
Neutrophils Relative %: 59 %
Platelets: 226 10*3/uL (ref 150–400)
RBC: 3.91 MIL/uL — ABNORMAL LOW (ref 4.22–5.81)
RDW: 13 % (ref 11.5–15.5)
WBC: 6.7 10*3/uL (ref 4.0–10.5)
nRBC: 0 % (ref 0.0–0.2)

## 2022-06-25 NOTE — Discharge Instructions (Addendum)
We recommend you call the Neurologist at the number provided for a close follow up. Unfortunately, we were unable to connect with the neuro team.  Please ensure that patient is monitored closely throughout the day, and there is another seizures Return to the ER.

## 2022-06-25 NOTE — ED Provider Notes (Signed)
[place] EMERGENCY DEPARTMENT AT [facility] Provider Note   CSN: [id number] Arrival date & time: 06/25/22  9563     History  Chief Complaint  Patient presents with   Seizures    [name] is a 57 y.o. male.  HPI     57 year old male comes in with chief complaint of seizures.  Patient has history of Down syndrome, seizure disorder for which she is on Vimpat.  According to the nursing home caregiver, patient woke up and was drowsy.  He was not interested in eating breakfast.  Soon she noticed that patient started jerking, he squeals and then he tightened up and both of his upper extremities.  Patient remained contracted for about 30 seconds with mild jerking  He also had urinary incontinence.  He was confused for about 10 to 15 minutes, and now he is back to baseline normal.  Patient has been eating and drinking well.  No recent infection.  The caregiver or the family is not unable to call for the last time patient had a seizure or when describing the normal seizures for him.  They think that he was diagnosed with seizures disorder when he was in his late 49s.  Home Medications Prior to Admission medications   Medication Sig Start Date End Date Taking? Authorizing Provider  acetaminophen (TYLENOL) 160 MG/5ML solution Take 20 mLs by mouth every 4 (four) hours as needed.    [provider]  acetaminophen (TYLENOL) 650 MG CR tablet Take 650 mg by mouth 3 (three) times daily.    [provider]  aluminum hydroxide-magnesium carbonate (ACID GONE) 95-358 MG/15ML SUSP Take by mouth. Patient not taking: Reported on 06/03/2022    [provider]  Artificial Tear Ointment (LACRI-LUBE OP) Place 1 application into both eyes at bedtime.    [provider]  atorvastatin (LIPITOR) 10 MG tablet Take 10 mg by mouth daily.    [provider]  benazepril (LOTENSIN) 10 MG tablet Take 10 mg by mouth at bedtime.    [provider]   bisacodyl (DULCOLAX) 10 MG suppository Place 10 mg rectally as needed for moderate constipation.    [provider]  brompheniramine-pseudoephedrine-dextromethorphan (DIMETAPP DM) 15-1-5 MG/5ML ELIX Take by mouth every 6 (six) hours as needed.    [provider]  calcitonin, salmon, (MIACALCIN/FORTICAL) 200 UNIT/ACT nasal spray Place 1 spray into alternate nostrils daily.    [provider]  Cholecalciferol (VITAMIN D3) 50 MCG (2000 UT) TABS Take 2,000 Units by mouth every morning.    [provider]  ciclopirox (PENLAC) 8 % solution Apply 1 application topically See admin instructions. Apply topically to toenails every Wednesday evening 01/08/21   [provider]  Cold Sore Products (BLISTEX EX) Apply topically.    [provider]  Diethyltoluamide (OFF ACTIVE EX) Apply topically.    [provider]  diphenhydrAMINE (BENADRYL) 25 MG tablet Take 25 mg by mouth as needed.    [provider]  Docosanol 10 % CREA Apply topically.    [provider]  doxycycline (VIBRAMYCIN) 50 MG capsule Take 50 mg by mouth daily.    [provider]  guaiFENesin (ROBITUSSIN) 100 MG/5ML liquid Take 5 mLs by mouth every 4 (four) hours as needed for cough or to loosen phlegm.    [provider]  Homeopathic Products (MURINE EAR WAX RELIEF OT) Place in ear(s).    [provider]  hydrocortisone cream 1 % Apply 1 application topically 2 (  two) times daily.    [provider]  Infant Care Products (JOHNSONS BABY SHAMPOO) SHAM Apply 1 application topically See admin instructions. Mix 1/2 water and 1/2 shampoo - use to scrub eyebrows and eyelids every morning    [provider]  Lacosamide (VIMPAT) 100 MG TABS Take 100mg  in AM and 200mg  in PM. 02/11/22   Ward Givens, NP  lacosamide (VIMPAT) 200 MG TABS tablet (CONTROL CYCLE) TAKE 1 TABLET BY MOUTH TWICE DAILY FOR SEIZURES 01/05/22   Ward Givens, NP   lactulose (CHRONULAC) 10 GM/15ML solution Take 10 g by mouth every morning.    [provider]  levothyroxine (SYNTHROID) 88 MCG tablet Take 88 mcg by mouth every morning. 01/16/21   [provider]  loperamide (IMODIUM A-D) 2 MG tablet Take 4 mg by mouth as needed for diarrhea or loose stools.    [provider]  magnesium hydroxide (MILK OF MAGNESIA) 400 MG/5ML suspension Take by mouth daily as needed for mild constipation.    [provider]  Omega-3 Fatty Acids (FISH OIL) 500 MG CAPS Take 1,500 mg by mouth every morning.    [provider]  promethazine (PHENERGAN) 25 MG tablet Take 25 mg by mouth as needed for nausea or vomiting.    [provider]  pyridOXINE (VITAMIN B-6) 100 MG tablet Take 100 mg by mouth every morning.    [provider]  rivastigmine (EXELON) 4.6 mg/24hr Place 4.6 mg onto the skin daily.    [provider]  Soap & Cleansers (PHISODERM EX) Apply 1 application topically 2 (two) times daily. Apply to face    [provider]  Sunscreens (COPPERTONE DEFEND & CARE EX) Apply topically.    [provider]  Tetrahydrozoline HCl (VISINE OP) Place 1 drop into both eyes 3 (three) times daily.    [provider]  tolnaftate (TINACTIN) 1 % spray Apply 1 application topically See admin instructions. Spray topically between toes daily at bedtime    [provider]  Zinc Oxide (BALMEX EX) Apply topically.    [provider]      Allergies    Clonidine derivatives    Review of Systems   Review of Systems  All other systems reviewed and are negative.   Physical Exam Updated Vital Signs BP (!) 117/46   Pulse (!) 50   Temp 97.8 F (36.6 C) (Axillary)   Resp 10   SpO2 97%  Physical Exam Vitals and nursing note reviewed.  Constitutional:      Appearance: He is well-developed.  HENT:     Head: Atraumatic.  Cardiovascular:     Rate and Rhythm: Normal rate.   Pulmonary:     Effort: Pulmonary effort is normal.  Musculoskeletal:     Cervical back: Neck supple.  Skin:    General: Skin is warm.  Neurological:     Mental Status: He is alert and oriented to person, place, and time.     ED Results / Procedures / Treatments   Labs (all labs ordered are listed, but only abnormal results are displayed) Labs Reviewed  COMPREHENSIVE METABOLIC PANEL - Abnormal; Notable for the following components:      Result Value   Albumin 3.4 (*)    All other components within normal limits  CBC WITH DIFFERENTIAL/PLATELET - Abnormal; Notable for the following components:   RBC 3.91 (*)    MCV 102.0 (*)    MCH 34.8 (*)    All other components within normal  limits    EKG EKG Interpretation  Date/Time:  Friday June 25 2022 09:49:28 EST Ventricular Rate:  82 PR Interval:    QRS Duration: 128 QT Interval:  394 QTC Calculation: 461 R Axis:   -41 Text Interpretation: Atrial fibrillation Right bundle branch block Anteroseptal infarct, age indeterminate No acute changes No significant change since last tracing Confirmed by Varney Biles [phone]) on 06/25/2022 12:42:04 PM  Radiology No results found.  Procedures Procedures    Medications Ordered in ED Medications - No data to display  ED Course/ Medical Decision Making/ A&P                             Medical Decision Making This patient presents to the ED with chief complaint(s) of seizure with pertinent past medical history of Down syndrome, seizure disorder.The complaint involves an extensive differential diagnosis and also carries with it a high risk of complications and morbidity.    The differential diagnosis includes  -Seizure disorder -Meningitis -Trauma -ICH -Electrolyte abnormality -Metabolic derangement -Stroke -Toxin induced seizures -Medication side effects -Hypoxia -Hypoglycemia  It appears that patient has extremely well-controlled seizure disorder.  His last neurology  note was reviewed by me, there were no medication changes made. The initial plan is to get basic labs.  Patient has sutures that were placed 2 weeks ago, they have not dissolved yet.  We will remove the sutures.   Additional history obtained: Group home caregiver Additional history obtained from outpatient neurology note Records reviewed outpatient neurology note  Independent labs interpretation:  The following labs were independently interpreted: CBC, metabolic profile reassuring   Treatment and Reassessment: Family at the bedside.  Patient has hard time with CT scan.  They have agreed to monitoring him in the ER for 4 hours rather than repeating CAT scan. He just had a CAT scan few weeks back for trauma, it was negative for any acute process.  Consultation: - Consulted or discussed management/test interpretation with external professional: Outpatient neurologist.  Unfortunately [place] neurology answering service will not allow [place] to the case with neurology team and so we have advised the family to contact neurology service directly.  Amount and/or Complexity of Data Reviewed Labs: ordered.  Final Clinical Impression(s) / ED Diagnoses Final diagnoses:  Seizure [facility])    Rx / DC Orders ED Discharge Orders     None         Varney Biles, MD 06/25/22 1412

## 2022-06-25 NOTE — ED Provider Notes (Signed)
.  Suture Removal  Date/Time: 06/25/2022 12:48 PM  Performed by: Sherrell Puller, PA-C Authorized by: Sherrell Puller, PA-C   Consent:    Consent obtained:  Verbal   Consent given by:  Elonda Husky protocol:    Patient identity confirmed:  Arm band Location:    Location:  Head/neck   Head/neck location:  Forehead Procedure details:    Wound appearance:  No signs of infection, good wound healing and clean   Number of sutures removed:  4 Post-procedure details:    Post-removal:  No dressing applied   Procedure completion:  Tolerated well, no immediate complications     Sherrell Puller, PA-C 06/25/22 [place], Ankit, MD 06/25/22 1407

## 2022-06-25 NOTE — Telephone Encounter (Signed)
I returned a call from [facility] answering machine from emergency room Dr. Kathrynn Humble about this patient.  She was being seen in the for episode of confusion and the patient lives in a group home and history was not quite clear whether she actually had a seizure or not.  Patient had recently been seen in [place] office.  I advised him to continue current medication regime and call the office to be seen if she does not get better soon.  Dr. Kathrynn Humble agreed with the plan

## 2022-06-25 NOTE — ED Triage Notes (Signed)
BIB EMS from [facility]. Called for patient having suspected seizure. Report that patients limbs "locked up" and then patient started to lean to one side. Per EMS report patient has become more alert throughout transport. Patient is non-verbal, hx of autism and dementia. CBG was 123 per EMS.

## 2022-06-28 ENCOUNTER — Telehealth: Payer: Self-pay | Admitting: Adult Health

## 2022-06-28 NOTE — Telephone Encounter (Signed)
I called father of pt.  Pt was seen in the ED for seizure 06-25-22. No medication changes made.  Pt on lacosamide 100ng po am ad 200mg  po qhs.   Father (retired physician he informed me) requesting appt.  Please advise.  Since medication change 6 mo ago, has had jerking when prior to med change did not.

## 2022-06-28 NOTE — Telephone Encounter (Signed)
Pt's father is asking for a call to discuss [place] Mal seizure pt had on this past Friday, please call

## 2022-06-29 NOTE — Telephone Encounter (Signed)
I called father of pt.  I offered appt for 07-01-2022 at 1330 with Dr. Krista Blue.  He will notify group home about appt.  Appreciated call back.

## 2022-06-29 NOTE — Telephone Encounter (Signed)
After checking DPR ,Phone rep called the number listed for pt's father numerous times, each time a message came on that the call could not go thru, no option to leave a vm. [facility] # has message that the office is closed

## 2022-06-29 NOTE — Telephone Encounter (Signed)
Please call and offer f/u today at 130 with Dr. Krista Blue or 07/01/22 at 130, thanks.

## 2022-06-29 NOTE — Telephone Encounter (Signed)
Will monitor for f/u appt.

## 2022-06-30 ENCOUNTER — Other Ambulatory Visit: Payer: Self-pay | Admitting: Adult Health

## 2022-07-01 ENCOUNTER — Ambulatory Visit (INDEPENDENT_AMBULATORY_CARE_PROVIDER_SITE_OTHER): Payer: Medicare Other | Admitting: Neurology

## 2022-07-01 ENCOUNTER — Encounter: Payer: Self-pay | Admitting: Neurology

## 2022-07-01 VITALS — BP 130/63 | HR 67 | Ht 60.0 in | Wt 170.0 lb

## 2022-07-01 DIAGNOSIS — Q909 Down syndrome, unspecified: Secondary | ICD-10-CM

## 2022-07-01 DIAGNOSIS — G40909 Epilepsy, unspecified, not intractable, without status epilepticus: Secondary | ICD-10-CM

## 2022-07-01 MED ORDER — LACOSAMIDE 200 MG PO TABS: 200.0000 mg | ORAL_TABLET | Freq: Two times a day (BID) | ORAL | 3 refills | Status: DC

## 2022-07-01 NOTE — Progress Notes (Signed)
Chief Complaint  Patient presents with   Follow-up    Rm 15. Patient is with caregiver and father. 01.26.24 he was seen in ED for seizure and father is concerned with seizure medications.       ASSESSMENT AND PLAN  [name] is a 57 y.o. male   Downs syndrome Epilepsy Polypharmacy treatment   Previously reported body jerking movement, likely represent seizure,  Will go back up on Vimpat 200 mg twice a day document all event,  EEG  Return To Clinic With NP In 6 Months   DIAGNOSTIC DATA (LABS, IMAGING, TESTING) - I reviewed patient records, labs, notes, testing and imaging myself where available.   MEDICAL HISTORY:  [name] is a 57 year old male, seen in request by his primary care from [name] [place] group home provider for evaluation of increased gait abnormality, he is accompanied by staff at today's visit on July 03, 2021.  I reviewed and summarized the referring note. PMHX. Down's syndrome HTN Seizure,  Patient has Down syndrome, has been In facility for many years, per caregiver who has known him since 2022, he does have some regression, was noted to have increased gait abnormality, there was no recurrent seizure reported  He was seen by our clinic previously by Dr. Jannifer [place], last visit was with J[facility] Blossom in October 2019, long history of seizure, previously was treated with Depakote, and Keppra, reported elevated ammonia level, Depakote was switched to Vimpat, titrating dose to 150 mg twice a day now, there was also reported behavior issue with higher dose of Keppra,  Patient had hospital admission in September 2022 when he was found unresponsive, then became reactive but not himself, incontinence of urine, no seizure-like activity noted,  EEG by Dr. Hortense Ramal on Feb 16 2021 This technically difficult study is consistent with patient's known history of epilepsy with generalized onset.There is also moderate diffuse encephalopathy, non etiology. No  seizures were seen throughout the recording.  I personally reviewed CT head without contrast, no acute abnormality, parenchymal loss, enlarged ventricle, advanced for age  CT of cervical spine, no acute abnormality, multilevel degenerative changes  CT of maxillary showed mildly displaced fracture of left mandibular condyle,  Laboratory evaluations in September 2022, normal procalcitonin, INR, CMP with mild decreased calcium 8.4, elevated alkaline phosphate 160, albumin 2.8, normal P71, folic acid, urinalysis showed rare bacteria, Keppra level was 6.5,  He was diagnosed with acute metabolic encephalopathy, Keppra dose was increased from 250 mg twice a day to 500 mg twice a day, remained on Vimpat 150 mg twice a day  UPDATE Jul 01 2022: He lives at a group home, is companied by his father and caregiver from group home at today's visit, who has known him for 2 years  His last visit with Jinny Blossom in January 2024, staff reported almost daily sudden body jerking movement, disturb his walking, despite lower dose of Vimpat from 400 mg daily to 300 mg daily  Presented to hospital on June 25, 2022, had a seizure, preceding by similar body jerking movement, described as if he was coughing, but this time 3 bigger body jerk, tonic movement of his upper extremity, making noises, followed by bladder incontinence, postevent confusion,  CT head without contrast on June 12, 2022, atrophy small vessel disease no acute abnormality CT cervical spine no acute abnormality  Laboratory evaluations CBC hemoglobin of 13.6, CMP showed mild low albumin 3.4,    PHYSICAL EXAM:   Vitals:   07/01/22 1351  BP: 130/63  Pulse: 67  Weight: 170 lb (77.1 kg)  Height: 5' (1.524 m)     Body mass index is 33.2 kg/m.  PHYSICAL EXAMNIATION:  Gen: NAD, conversant, well nourised, well groomed                     Cardiovascular: Regular rate rhythm, no peripheral edema, warm, nontender. Eyes: Conjunctivae clear without  exudates or hemorrhage Neck: Supple, no carotid bruits. Pulmonary: Clear to auscultation bilaterally   NEUROLOGICAL EXAM:  MENTAL STATUS: Speech/cognition: He is pleasant, sitting in the wheelchair, typical facial features for patient with Down syndrome, did not answer question, fidgety with his folder in his hand    CRANIAL NERVES: CN II: Visual fields are full to confrontation. Pupils are round equal and briskly reactive to light. CN III, IV, VI: extraocular movement are normal. No ptosis. CN V: Facial sensation is intact to light touch CN VII: Face is symmetric with normal eye closure  CN VIII: Hearing is normal to causal conversation. CN IX, X: Phonation is normal. CN XI: Head turning and shoulder shrug are intact  MOTOR: Moving 4 extremities without difficulty  REFLEXES: Reflexes are 1 and symmetric at the biceps, triceps, knees, and ankles. Plantar responses are flexor.  SENSORY: Withdraws to pain   COORDINATION: There is no trunk or limb dysmetria noted.  GAIT/STANCE: Left foot in boot, deferred.  REVIEW OF SYSTEMS:  Full 14 system review of systems performed and notable only for as above All other review of systems were negative.   ALLERGIES: Allergies  Allergen Reactions   Clonidine Derivatives Other (See Comments)    Causes symptomatic bradycardia with syncope and hypotension    HOME MEDICATIONS: Current Outpatient Medications  Medication Sig Dispense Refill   acetaminophen (TYLENOL) 160 MG/5ML solution Take 20 mLs by mouth every 4 (four) hours as needed.     acetaminophen (TYLENOL) 650 MG CR tablet Take 650 mg by mouth 3 (three) times daily.     aluminum hydroxide-magnesium carbonate (ACID GONE) 95-358 MG/15ML SUSP Take by mouth.     Artificial Tear Ointment (LACRI-LUBE OP) Place 1 application into both eyes at bedtime.     atorvastatin (LIPITOR) 10 MG tablet Take 10 mg by mouth daily.     benazepril (LOTENSIN) 10 MG tablet Take 10 mg by mouth at bedtime.      bisacodyl (DULCOLAX) 10 MG suppository Place 10 mg rectally as needed for moderate constipation.     brompheniramine-pseudoephedrine-dextromethorphan (DIMETAPP DM) 15-1-5 MG/5ML ELIX Take by mouth every 6 (six) hours as needed.     calcitonin, salmon, (MIACALCIN/FORTICAL) 200 UNIT/ACT nasal spray Place 1 spray into alternate nostrils daily.     Cholecalciferol (VITAMIN D3) 50 MCG (2000 UT) TABS Take 2,000 Units by mouth every morning.     ciclopirox (PENLAC) 8 % solution Apply 1 application topically See admin instructions. Apply topically to toenails every Wednesday evening     Cold Sore Products (BLISTEX EX) Apply topically.     Diethyltoluamide (OFF ACTIVE EX) Apply topically.     diphenhydrAMINE (BENADRYL) 25 MG tablet Take 25 mg by mouth as needed.     Docosanol 10 % CREA Apply topically.     doxycycline (VIBRAMYCIN) 50 MG capsule Take 50 mg by mouth daily.     guaiFENesin (ROBITUSSIN) 100 MG/5ML liquid Take 5 mLs by mouth every 4 (four) hours as needed for cough or to loosen phlegm.     Homeopathic Products (MURINE EAR WAX RELIEF OT) Place in ear(s).  hydrocortisone cream 1 % Apply 1 application topically 2 (two) times daily.     Infant Care Products (JOHNSONS BABY SHAMPOO) SHAM Apply 1 application topically See admin instructions. Mix 1/2 water and 1/2 shampoo - use to scrub eyebrows and eyelids every morning     Lacosamide (VIMPAT) 100 MG TABS Take 100mg  in AM and 200mg  in PM. 90 tablet 5   lacosamide (VIMPAT) 200 MG TABS tablet (CONTROL CYCLE) TAKE 1 TABLET BY MOUTH TWICE DAILY FOR SEIZURES 60 tablet 4   lactulose (CHRONULAC) 10 GM/15ML solution Take 10 g by mouth every morning.     levothyroxine (SYNTHROID) 88 MCG tablet Take 88 mcg by mouth every morning.     loperamide (IMODIUM A-D) 2 MG tablet Take 4 mg by mouth as needed for diarrhea or loose stools.     magnesium hydroxide (MILK OF MAGNESIA) 400 MG/5ML suspension Take by mouth daily as needed for mild constipation.      Omega-3 Fatty Acids (FISH OIL) 500 MG CAPS Take 1,500 mg by mouth every morning.     promethazine (PHENERGAN) 25 MG tablet Take 25 mg by mouth as needed for nausea or vomiting.     pyridOXINE (VITAMIN B-6) 100 MG tablet Take 100 mg by mouth every morning.     rivastigmine (EXELON) 4.6 mg/24hr Place 4.6 mg onto the skin daily.     Soap & Cleansers (PHISODERM EX) Apply 1 application topically 2 (two) times daily. Apply to face     Sunscreens (COPPERTONE DEFEND & CARE EX) Apply topically.     Tetrahydrozoline HCl (VISINE OP) Place 1 drop into both eyes 3 (three) times daily.     tolnaftate (TINACTIN) 1 % spray Apply 1 application topically See admin instructions. Spray topically between toes daily at bedtime     Zinc Oxide (BALMEX EX) Apply topically.     No current facility-administered medications for this visit.    PAST MEDICAL HISTORY: Past Medical History:  Diagnosis Date   Down's syndrome    GERD (gastroesophageal reflux disease)    Hypertension    Hypothyroid    Lives in group home    [phone]-fax-[phone]-[place]oxanne-nurse   Nystagmus    Obstructive sleep apnea (adult) (pediatric)    does use a cpap at night   Seizure disorder ([place]) 10/28/2016   Seizures ([place])     PAST SURGICAL HISTORY: Past Surgical History:  Procedure Laterality Date   IRRIGATION AND DEBRIDEMENT  07/01/2014       ORIF HUMERUS FRACTURE Left 02/05/2013   Procedure: OPEN REDUCTION INTERNAL FIXATION (ORIF) MEDIAL HUMERUS CONDYLE FRACTURE;  Surgeon: Jolyn Nap, MD;  Location: [facility];  Service: Orthopedics;  Laterality: Left;   Right Elbow surgery      FAMILY HISTORY: Family History  Problem Relation Age of Onset   Dementia Maternal Grandmother    Diabetes Paternal Grandfather    Heart failure Paternal Grandfather    Hypertension Paternal Grandfather    Seizures Neg Hx     SOCIAL HISTORY: Social History   Socioeconomic History   Marital status: Single    Spouse  name: Not on file   Number of children: 0   Years of education: 12   Highest education level: Not on file  Occupational History   Occupation: Day [employer]  Tobacco Use   Smoking status: Never   Smokeless tobacco: Never  Vaping Use   Vaping Use: Never used  Substance and Sexual Activity   Alcohol use: No   Drug  use: No   Sexual activity: Not Currently    Birth control/protection: None  Other Topics Concern   Not on file  Social History Narrative   Lives at a group home   Caffeine use: Tea/soda once per week   Right handed   Social Determinants of Health   Financial Resource Strain: Not on file  Food Insecurity: Not on file  Transportation Needs: Not on file  Physical Activity: Not on file  Stress: Not on file  Social Connections: Not on file  Intimate Partner Violence: Not on file      Levert Feinstein, M.D. Ph.D.  [facility] [address] [place], [place] [address] Ph: [phone] Fax: [phone]  CC:  Lucretia Field [address] [place],  [place] [address]  Katina Dung M

## 2022-07-05 ENCOUNTER — Other Ambulatory Visit: Payer: Self-pay | Admitting: Adult Health

## 2022-07-05 ENCOUNTER — Other Ambulatory Visit: Payer: Medicare Other | Admitting: *Deleted

## 2022-07-12 ENCOUNTER — Ambulatory Visit (INDEPENDENT_AMBULATORY_CARE_PROVIDER_SITE_OTHER): Payer: Medicare Other | Admitting: Neurology

## 2022-07-12 DIAGNOSIS — G40909 Epilepsy, unspecified, not intractable, without status epilepticus: Secondary | ICD-10-CM

## 2022-07-12 DIAGNOSIS — Q909 Down syndrome, unspecified: Secondary | ICD-10-CM

## 2022-07-13 NOTE — Progress Notes (Signed)
I called his father, Dr. Janyth Contes, retired ophthalmologist, explained the findings, marked abnormal EEG, frequent spikes slow waveforms, high risk for recurrent seizure, he is on higher dose of Vimpat 200 mg twice a day now, there was no reported significant side effect

## 2022-07-13 NOTE — Procedures (Signed)
   HISTORY: 57 year old male, with history of Down syndrome, epilepsy, presenting with recurrent spells of body jerking movement  TECHNIQUE:  This is a routine 16 channel EEG recording with one channel devoted to a limited EKG recording.  It was performed during wakefulness, drowsiness and asleep.  Photic stimulation were performed as activating procedures.  There are minimum muscle and movement artifact noted.  Upon maximum arousal, posterior dominant waking rhythm consistent of dysrhythmic theta range activity, activities are symmetric over the bilateral posterior derivations and attenuated with eye opening.  There are occasionally spike slow wave form.  Photic stimulation induced prolonged generalized spike slow wave form, lasting for 10 seconds, there was no clinical seizure documented.  During EEG recording, patient developed drowsiness there are increased appearance of generalized spike slow wave forms, often frontal dominant EKG demonstrate normal sinus rhythm.  CONCLUSION: This is a marked abnormal EEG, there are frequent protrusion of spike slow waves, photic stimulation induced prolonged generalized spike slow discharge, but no clinical seizure.  Above findings consistent with generalized epilepsy.  He is at high risk for recurrence seizure.  Marcial Pacas, M.D. Ph.D.  [facility] [place], [place] [address] Phone: [phone] Fax:      [phone]

## 2022-07-17 IMAGING — CT CT CERVICAL SPINE W/O CM
3 of 4 series · 12 of 33 positions shown, 14 images · non-contrast
Comparison: CT cervical spine 12/11/2020

CLINICAL DATA: Unwitnessed seizure, found unconscious

EXAM:
CT CERVICAL SPINE WITHOUT CONTRAST
TECHNIQUE: Multidetector CT imaging of the cervical spine was performed without
intravenous contrast. Multiplanar CT image reconstructions were also
generated.

[Series 6: orthogonal bone · axial · 0.23mm/px · z∈[+1264,+1387]mm · 4 of 94 slices shown, 5 images]
[im 16/94  soft-tissue]
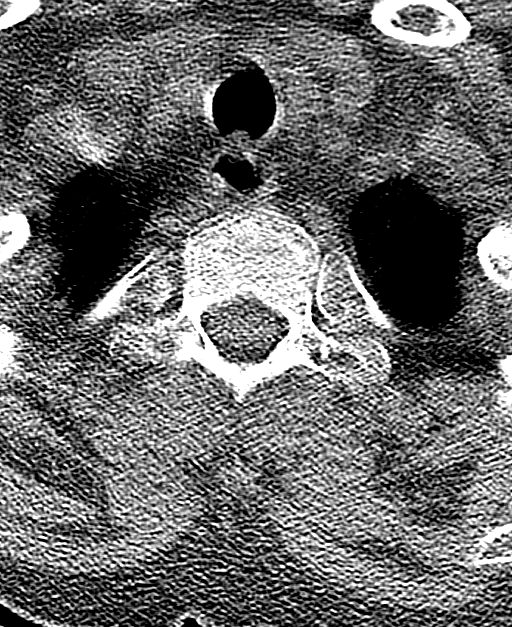
[im 16/94  bone]
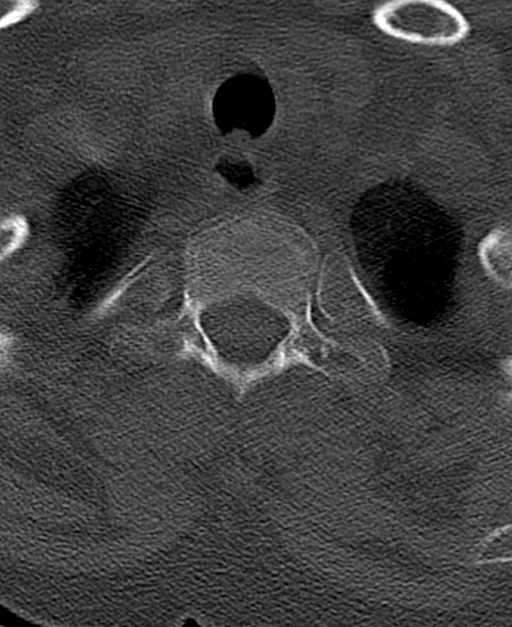
[im 32/94  bone]
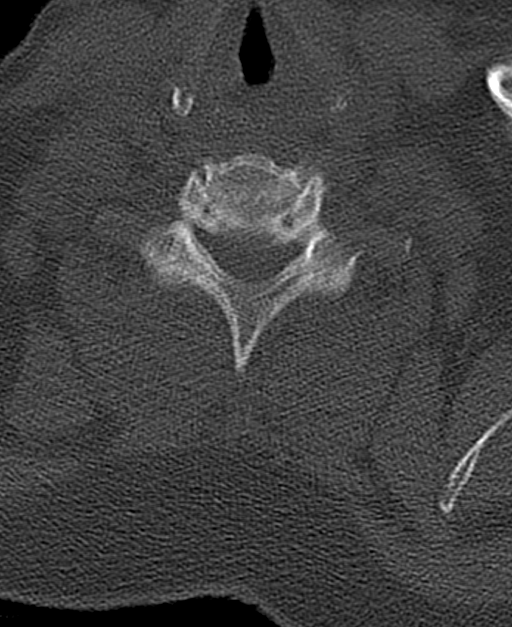
[im 63/94  bone]
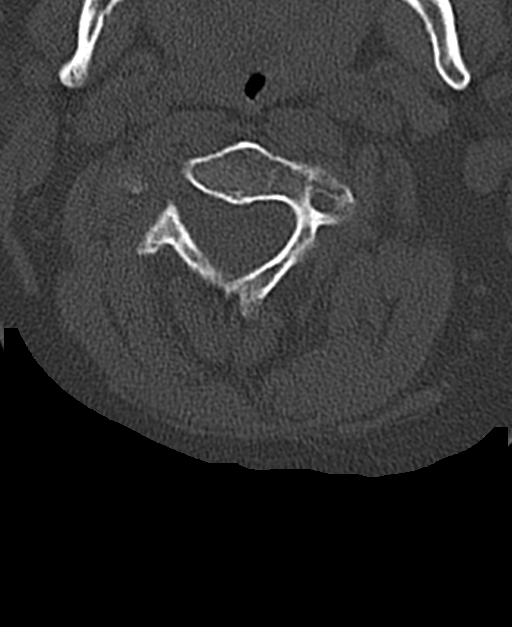
[im 78/94  bone]
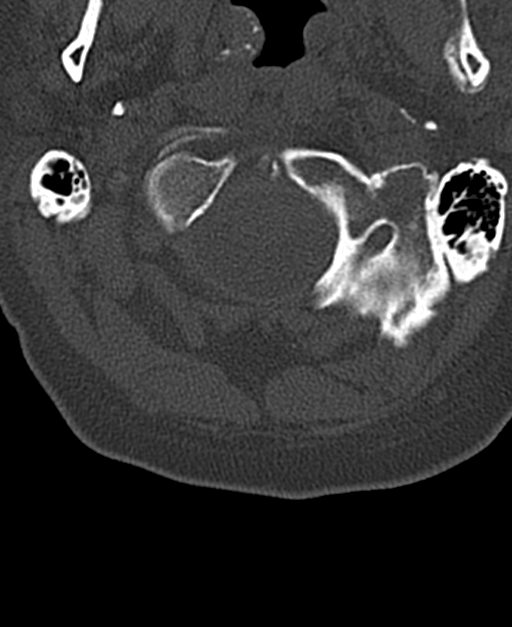

[Series 7: coronal bone · coronal · 0.25mm/px · 3 of 75 slices shown]
[im 15/75  bone]
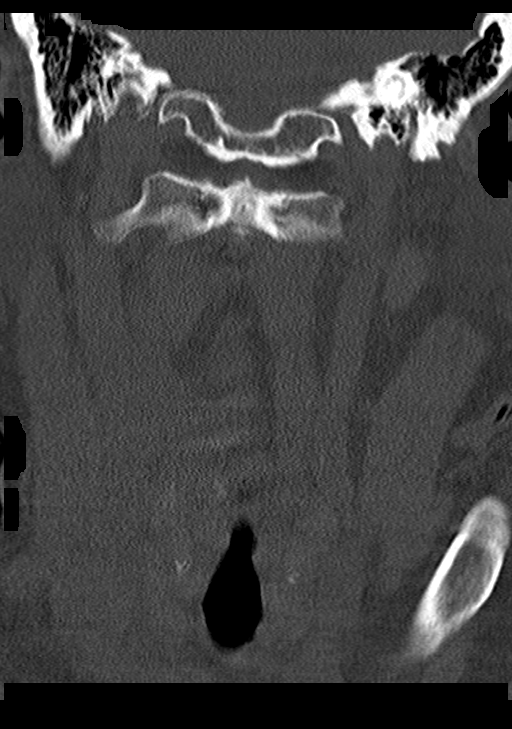
[im 30/75  bone]
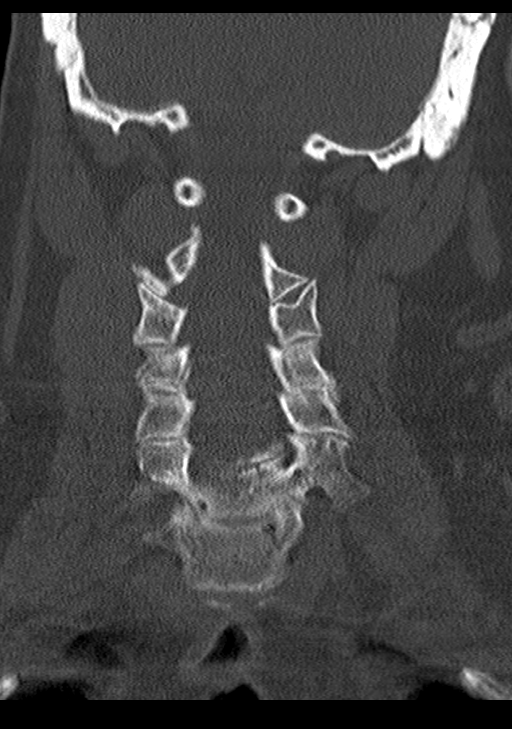
[im 45/75  bone]
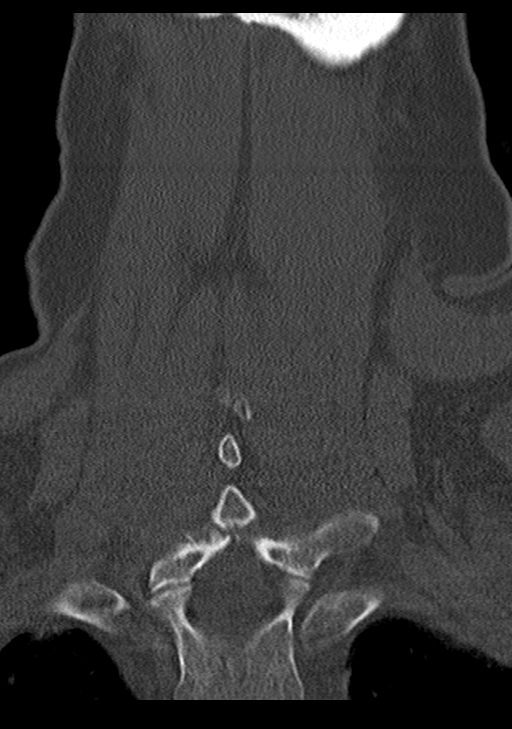

[Series 8: sagittal bone · sagittal · 0.29mm/px · 5 of 61 slices shown, 6 images]
[im 21/61  bone]
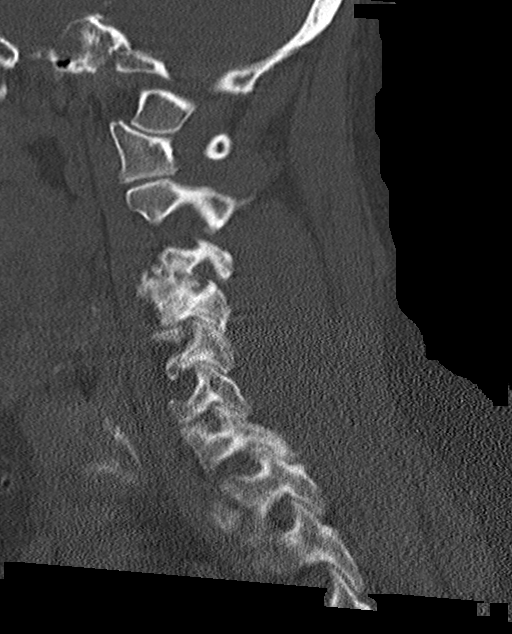
[im 26/61  bone]
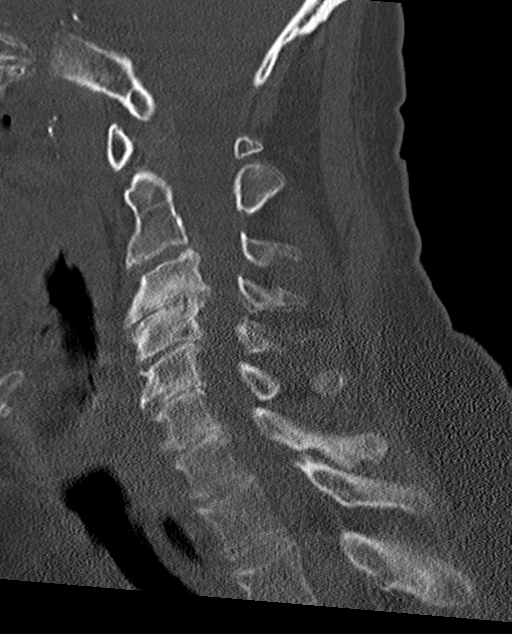
[im 31/61  soft-tissue]
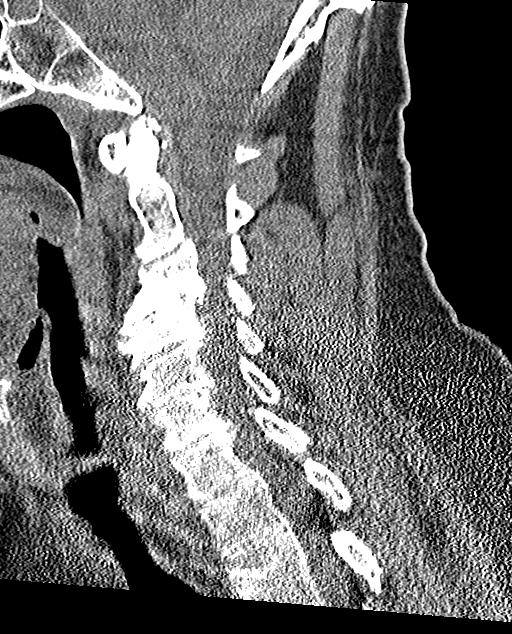
[im 31/61  bone]
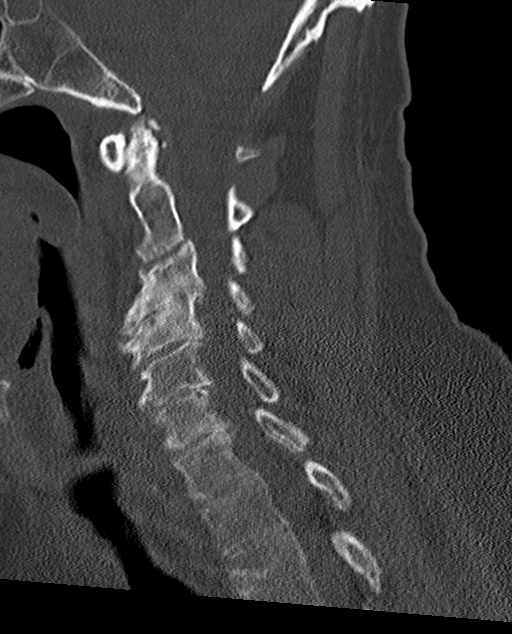
[im 36/61  bone]
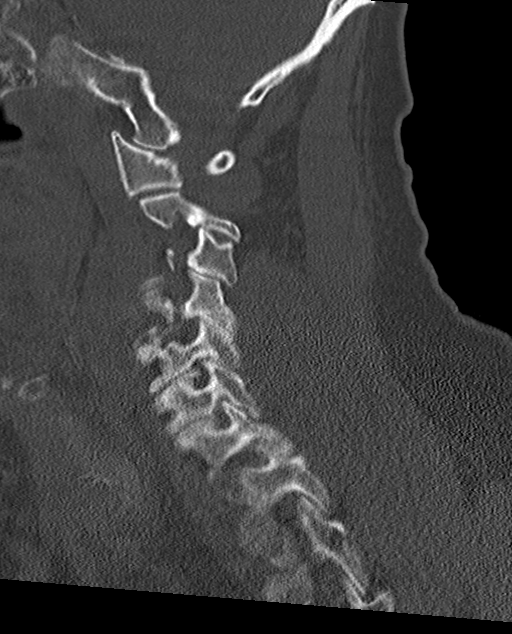
[im 41/61  bone]
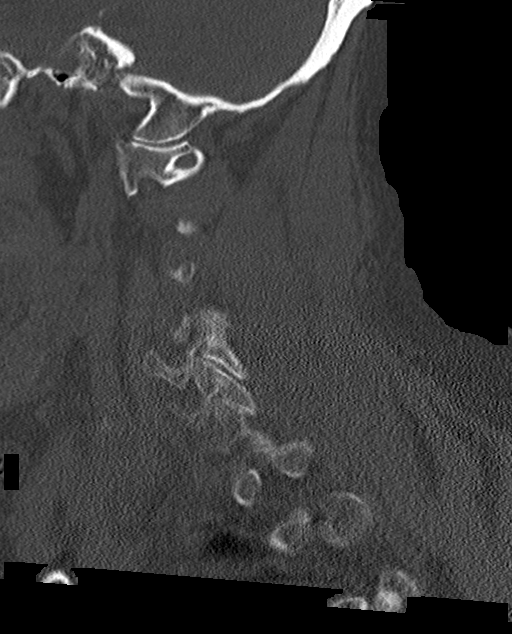

[12 of 33 positions shown; findings below may reference images not displayed]

FINDINGS: Alignment: There is straightening of the cervical spine lordosis
with mild focal kyphosis centered at C3, unchanged. There is trace
retrolisthesis of C3 on C4, also unchanged. There is no evidence of
traumatic malalignment. There is no jumped or perched facet.

Skull base and vertebrae: Skull base alignment is normal. Vertebral
body heights are preserved. There is no evidence of acute vertebral
body fracture.

Soft tissues and spinal canal: No prevertebral fluid or swelling. No
visible canal hematoma.

Disc levels: There is marked multilevel intervertebral disc space
narrowing, most advanced at C3-C4. There is associated multilevel
uncovertebral and facet arthropathy resulting in mild spinal canal
stenosis at C3-C4 and severe bilateral neural foraminal stenosis and
numerous levels.

Upper chest: The imaged portions of the lung apices are clear.

Other: There is an acute mildly displaced fracture of the left
mandibular condyle. Calcification in the right nasopharynx is
unchanged going back to 5233
IMPRESSION: 1. No acute fracture or traumatic malalignment of the cervical
spine.
2. Acute mildly displaced fracture of the left mandibular condyle.
3. Multilevel degenerative changes as above.

## 2022-07-18 IMAGING — CT CT MAXILLOFACIAL W/O CM
3 series · 16 of 47 positions shown, 19 images · non-contrast
Comparison: CT head 02/16/2019.

CLINICAL DATA: Dental facial anomaly

EXAM:
CT MAXILLOFACIAL WITHOUT CONTRAST
TECHNIQUE: Multidetector CT imaging of the maxillofacial structures was
performed. Multiplanar CT image reconstructions were also generated.

[Series 3: max soft · axial · 0.33mm/px · z∈[-228,-88]mm · 10 of 82 slices shown, 13 images]
[im 6/82  brain]
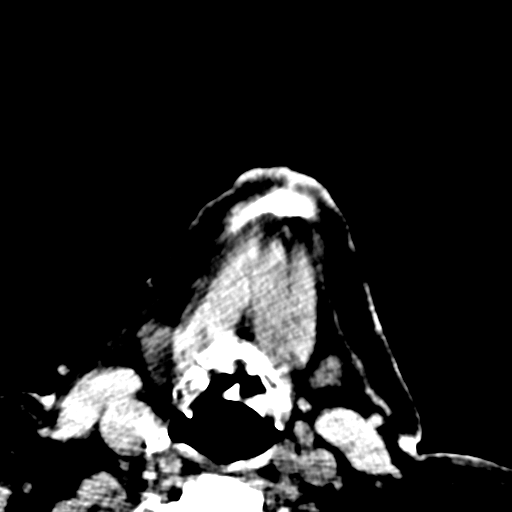
[im 6/82  bone]
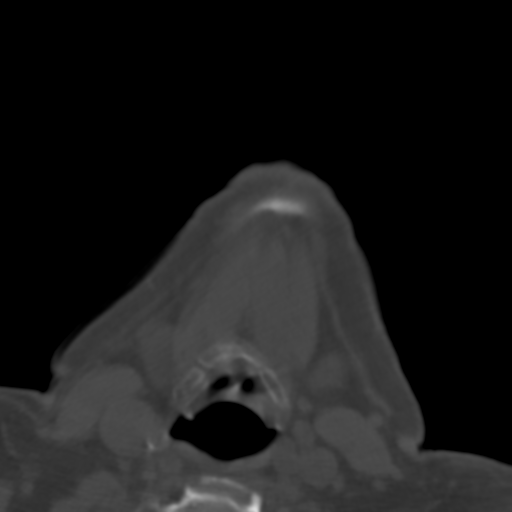
[im 14/82  bone]
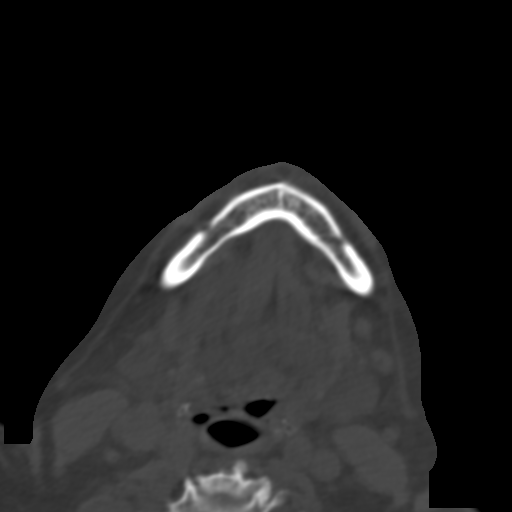
[im 23/82  bone]
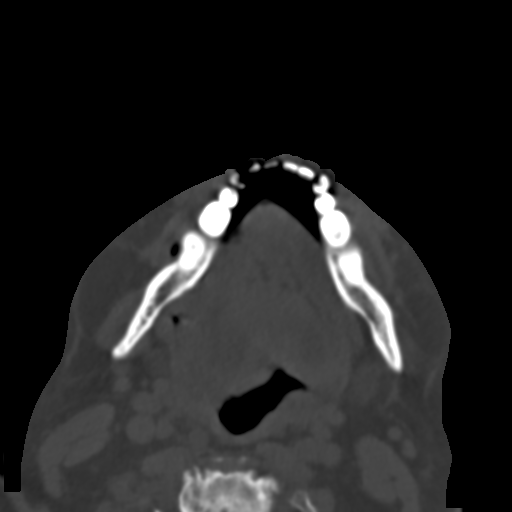
[im 28/82  bone]
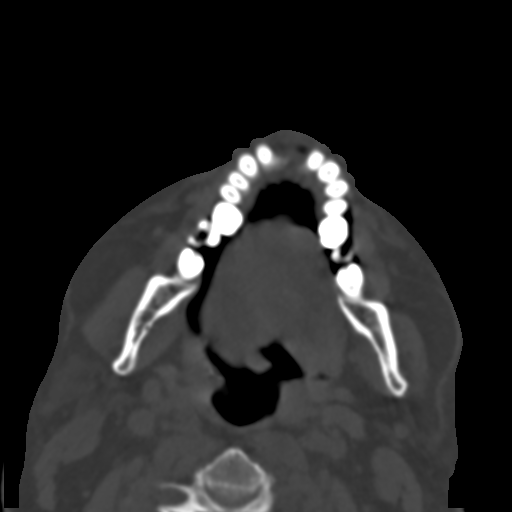
[im 37/82  brain]
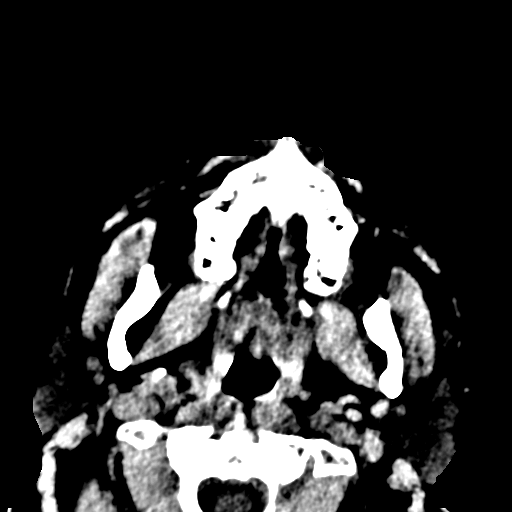
[im 37/82  bone]
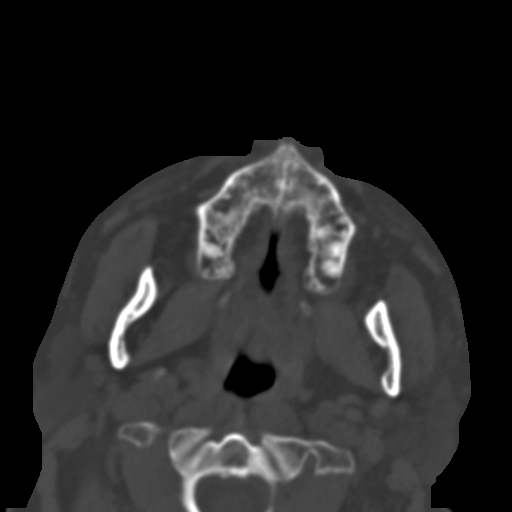
[im 45/82  bone]
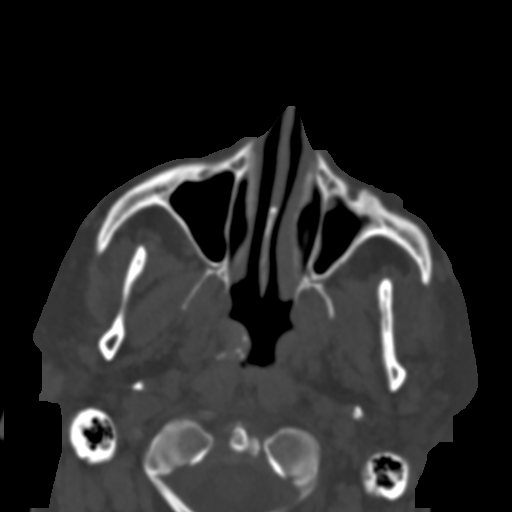
[im 54/82  bone]
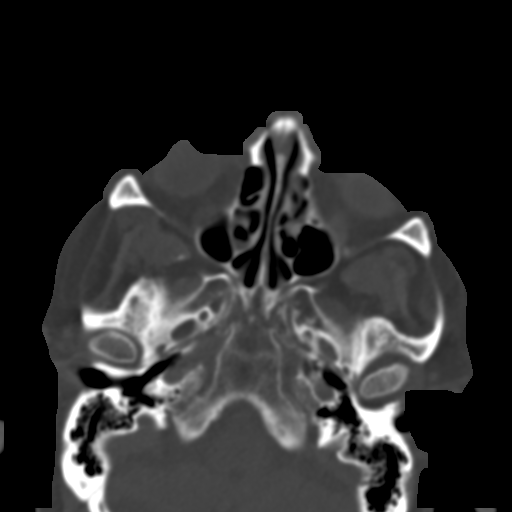
[im 62/82  bone]
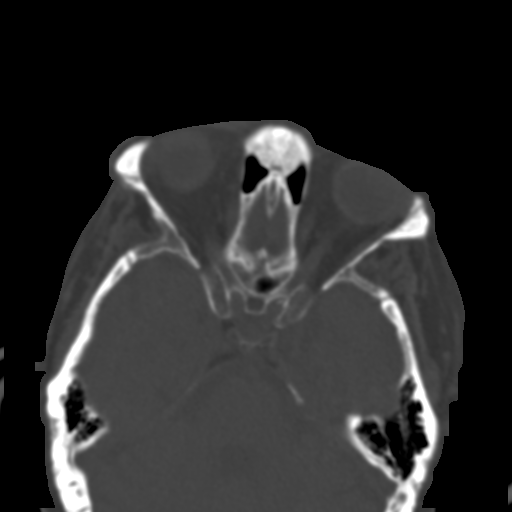
[im 68/82  brain]
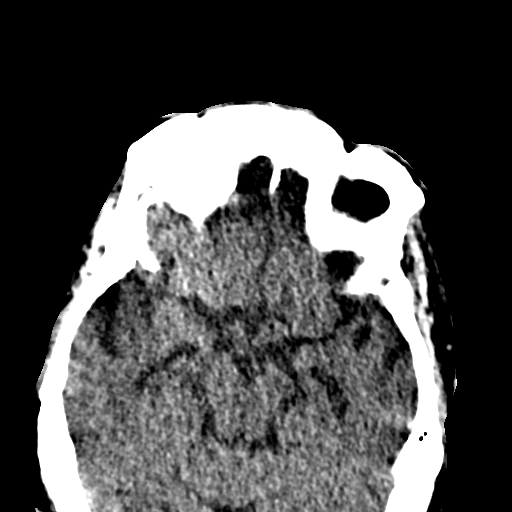
[im 68/82  bone]
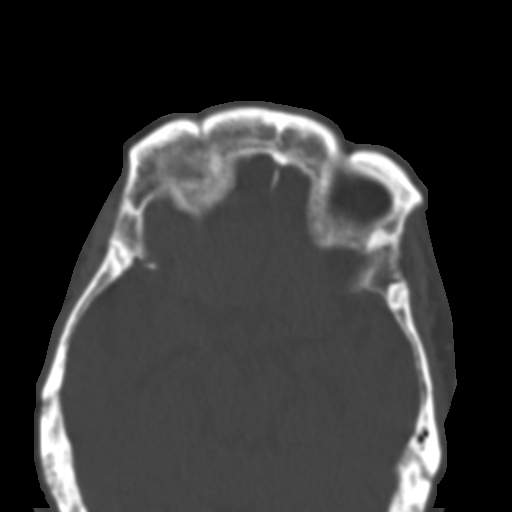
[im 76/82  bone]
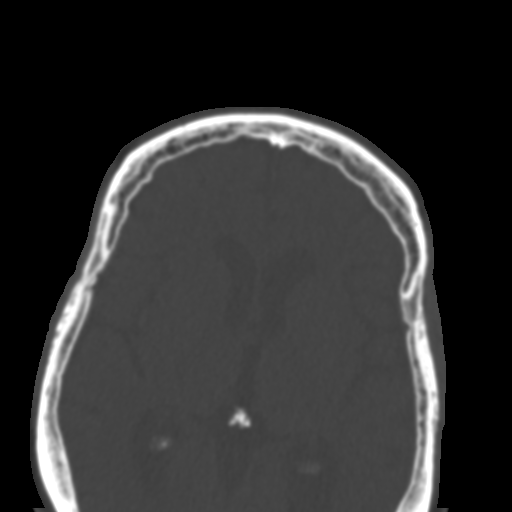

[Series 7: coronal soft · coronal · 0.34mm/px · 3 of 76 slices shown]
[im 26/76  bone]
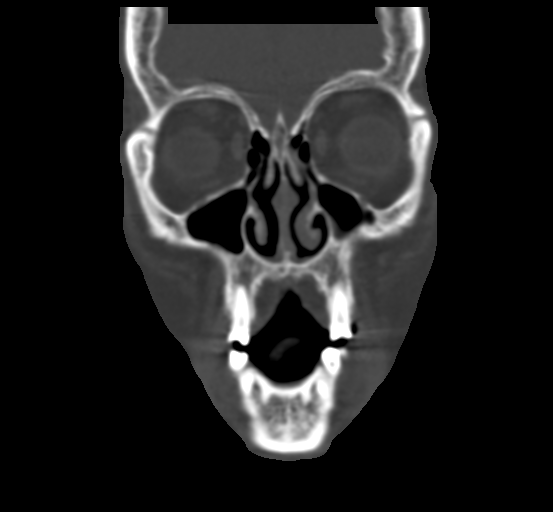
[im 34/76  bone]
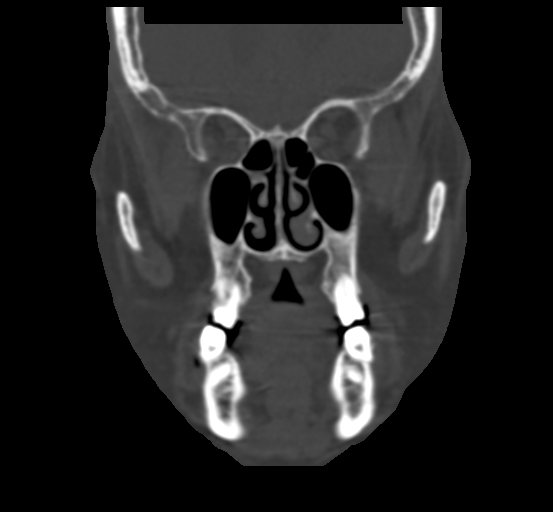
[im 42/76  bone]
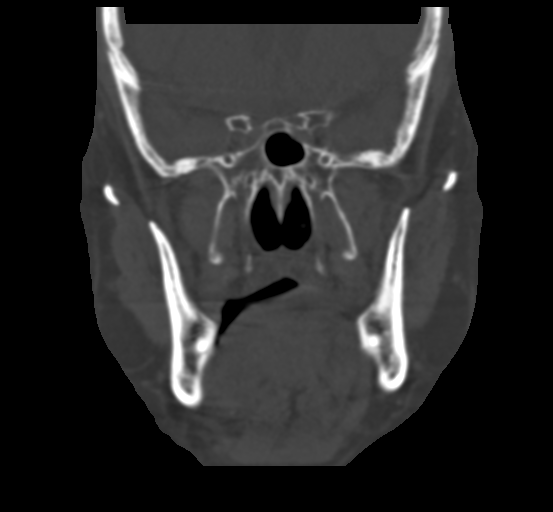

[Series 8: sagittal soft · sagittal · 0.33mm/px · 3 of 82 slices shown]
[im 28/82  bone]
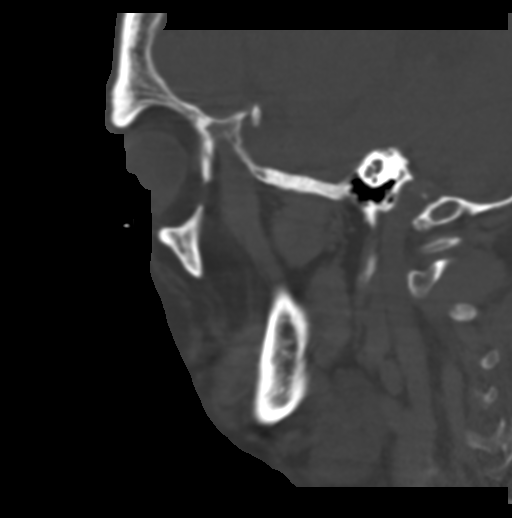
[im 41/82  bone]
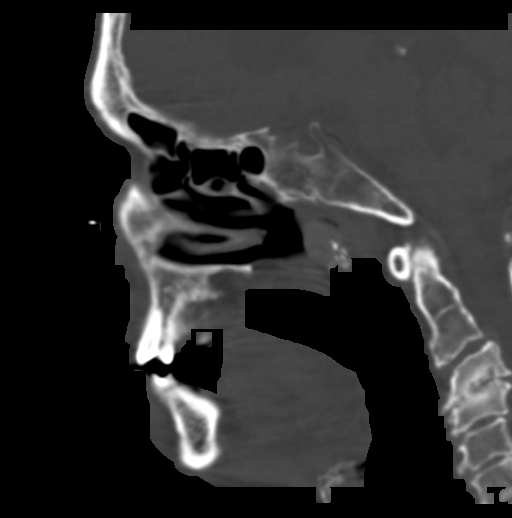
[im 55/82  bone]
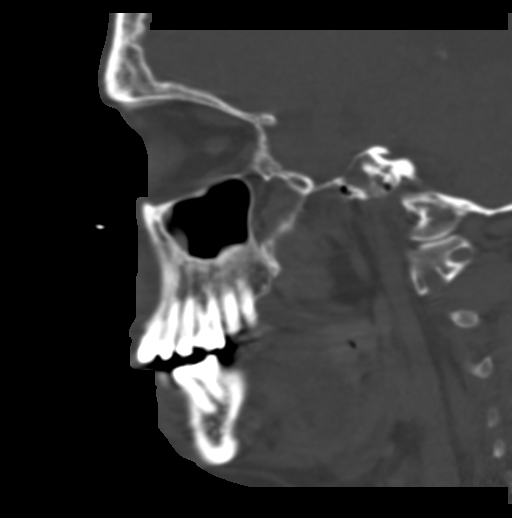

[16 of 47 positions shown; findings below may reference images not displayed]

FINDINGS: Osseous: Mildly displaced fracture through the head/neck of the left
mandible (series 4, images 48-50 and series 7, image 58). No
additional fracture is seen in the mandible. No other acute facial
bone fracture.

Orbits: Negative. No traumatic or inflammatory finding.

Sinuses: Minimal mucosal thickening.  Otherwise clear.

Soft tissues: Negative.

Limited intracranial: No significant or unexpected finding.
IMPRESSION: Mildly displaced fracture through the head/neck of the left
mandible. No additional fracture is seen.

## 2022-08-16 ENCOUNTER — Telehealth: Payer: Self-pay | Admitting: Neurology

## 2022-08-16 NOTE — Telephone Encounter (Signed)
Tameka, pt's RN with [employer] is asking to be called with results to pt's EEG she can be reached at [phone]

## 2022-08-18 NOTE — Telephone Encounter (Signed)
CONCLUSION: This is a marked abnormal EEG, there are frequent protrusion of spike slow waves, photic stimulation induced prolonged generalized spike slow discharge, but no clinical seizure.  Above findings consistent with generalized epilepsy.  He is at high risk for recurrence seizure.   Please check to see if patient still have the sudden body jerking movement, was able to tolerate current dose of antiepileptic medications

## 2022-08-18 NOTE — Telephone Encounter (Addendum)
Called and left message for call back at number provided. Will wait for her to reach out again. Results were already shared with father.

## 2022-08-18 NOTE — Telephone Encounter (Signed)
Called and spoke to Robinson, and faxed results to provided number

## 2022-09-24 ENCOUNTER — Emergency Department (HOSPITAL_COMMUNITY): Payer: Medicare Other

## 2022-09-24 ENCOUNTER — Other Ambulatory Visit: Payer: Self-pay

## 2022-09-24 ENCOUNTER — Emergency Department (HOSPITAL_COMMUNITY)
Admission: EM | Admit: 2022-09-24 | Discharge: 2022-09-24 | Disposition: A | Discharge status: Home / Self Care | Payer: Medicare Other | Attending: Emergency Medicine | Admitting: Emergency Medicine

## 2022-09-24 DIAGNOSIS — S8001XA Contusion of right knee, initial encounter: Secondary | ICD-10-CM | POA: Diagnosis not present

## 2022-09-24 DIAGNOSIS — Z79899 Other long term (current) drug therapy: Secondary | ICD-10-CM | POA: Insufficient documentation

## 2022-09-24 DIAGNOSIS — S301XXA Contusion of abdominal wall, initial encounter: Secondary | ICD-10-CM | POA: Insufficient documentation

## 2022-09-24 DIAGNOSIS — Z23 Encounter for immunization: Secondary | ICD-10-CM | POA: Insufficient documentation

## 2022-09-24 DIAGNOSIS — W19XXXA Unspecified fall, initial encounter: Secondary | ICD-10-CM | POA: Insufficient documentation

## 2022-09-24 DIAGNOSIS — S7011XA Contusion of right thigh, initial encounter: Secondary | ICD-10-CM | POA: Diagnosis not present

## 2022-09-24 DIAGNOSIS — S7001XA Contusion of right hip, initial encounter: Secondary | ICD-10-CM | POA: Insufficient documentation

## 2022-09-24 DIAGNOSIS — I10 Essential (primary) hypertension: Secondary | ICD-10-CM | POA: Insufficient documentation

## 2022-09-24 DIAGNOSIS — S0181XA Laceration without foreign body of other part of head, initial encounter: Secondary | ICD-10-CM | POA: Insufficient documentation

## 2022-09-24 DIAGNOSIS — S0990XA Unspecified injury of head, initial encounter: Secondary | ICD-10-CM | POA: Diagnosis present

## 2022-09-24 LAB — BASIC METABOLIC PANEL
Anion gap: 11 (ref 5–15)
BUN: 19 mg/dL (ref 6–20)
CO2: 22 mmol/L (ref 22–32)
Calcium: 8.8 mg/dL — ABNORMAL LOW (ref 8.9–10.3)
Chloride: 103 mmol/L (ref 98–111)
Creatinine, Ser: 0.8 mg/dL (ref 0.61–1.24)
GFR, Estimated: >60 mL/min (ref >60)
Glucose, Bld: 99 mg/dL (ref 70–99)
Potassium: 4 mmol/L (ref 3.5–5.1)
Sodium: 136 mmol/L (ref 135–145)

## 2022-09-24 LAB — CBC
HCT: 41.1 % (ref 39.0–52.0)
Hemoglobin: 14 g/dL (ref 13.0–17.0)
MCH: 34.8 pg — ABNORMAL HIGH (ref 26.0–34.0)
MCHC: 34.1 g/dL (ref 30.0–36.0)
MCV: 102.2 fL — ABNORMAL HIGH (ref 80.0–100.0)
Platelets: 224 10*3/uL (ref 150–400)
RBC: 4.02 MIL/uL — ABNORMAL LOW (ref 4.22–5.81)
RDW: 13 % (ref 11.5–15.5)
WBC: 5.1 10*3/uL (ref 4.0–10.5)
nRBC: 0 % (ref 0.0–0.2)

## 2022-09-24 LAB — CBG MONITORING, ED: Glucose-Capillary: 84 mg/dL (ref 70–99)

## 2022-09-24 LAB — URINALYSIS, ROUTINE W REFLEX MICROSCOPIC
Bilirubin Urine: NEGATIVE
Glucose, UA: NEGATIVE mg/dL
Hgb urine dipstick: NEGATIVE
Ketones, ur: NEGATIVE mg/dL
Leukocytes,Ua: NEGATIVE
Nitrite: NEGATIVE
Protein, ur: NEGATIVE mg/dL
Specific Gravity, Urine: 1.011 (ref 1.005–1.030)
pH: 6 (ref 5.0–8.0)

## 2022-09-24 MED ORDER — OXYCODONE-ACETAMINOPHEN 5-325 MG PO TABS
1.0000 | ORAL_TABLET | Freq: Once | ORAL | Status: AC
  Administered 2022-09-24: 1 via ORAL
  Filled 2022-09-24: qty 1

## 2022-09-24 MED ORDER — TETANUS-DIPHTH-ACELL PERTUSSIS 5-2.5-18.5 LF-MCG/0.5 IM SUSY
0.5000 mL | PREFILLED_SYRINGE | Freq: Once | INTRAMUSCULAR | Status: AC
  Administered 2022-09-24: 0.5 mL via INTRAMUSCULAR
  Filled 2022-09-24: qty 0.5

## 2022-09-24 MED ORDER — LIDOCAINE-EPINEPHRINE (PF) 2 %-1:200000 IJ SOLN
10.0000 mL | Freq: Once | INTRAMUSCULAR | Status: DC
  Filled 2022-09-24: qty 20

## 2022-09-24 MED ORDER — LIDOCAINE-EPINEPHRINE-TETRACAINE (LET) TOPICAL GEL
3.0000 mL | Freq: Once | TOPICAL | Status: AC
  Administered 2022-09-24: 3 mL via TOPICAL
  Filled 2022-09-24: qty 3

## 2022-09-24 NOTE — Discharge Instructions (Signed)
You have been evaluated for your fall.  You suffered a laceration to the forehead.  Sutures was placed and it needs to be removed in about 5 days.  You have bruising to your right hip and right knee which will improve over time.  Continue to take over-the-counter Tylenol as needed for pain.  Follow-up with your doctor for further care.

## 2022-09-24 NOTE — ED Provider Notes (Signed)
EMERGENCY DEPARTMENT AT [facility] Provider Note   CSN: [id number] Arrival date & time: 09/24/22  8657     History  Chief Complaint  Patient presents with   [facility] Laceration         [name] is a 57 y.o. male.  The history is provided by the EMS personnel and medical records. The history is limited by the absence of a caregiver and a developmental delay. No language interpreter was used.  Fall  Head Laceration     57 year old male with significant history of Down syndrome, has a legal guardian, lives in a group home, has history of seizure disorder and hypertension who was brought here via EMS from the group home after an unwitnessed fall.  Patient is nonverbal, normally requires heavy assistance with ambulation, who was found on the ground this morning by staff.  It appears he has fallen and struck his forehead as the staff noticed a laceration to his forehead.  He is not on any blood thinning medication.  EMS was able to get patient to the ambulance with assistance.  Unsure last tetanus status.  History is limited due to his nonverbal status.  Level 5 caveat's  Home Medications Prior to Admission medications   Medication Sig Start Date End Date Taking? Authorizing Provider  acetaminophen (TYLENOL) 160 MG/5ML solution Take 20 mLs by mouth every 4 (four) hours as needed.    [provider]  acetaminophen (TYLENOL) 650 MG CR tablet Take 650 mg by mouth 3 (three) times daily.    [provider]  aluminum hydroxide-magnesium carbonate (ACID GONE) 95-358 MG/15ML SUSP Take by mouth.    [provider]  Artificial Tear Ointment (LACRI-LUBE OP) Place 1 application into both eyes at bedtime.    [provider]  atorvastatin (LIPITOR) 10 MG tablet Take 10 mg by mouth daily.    [provider]  benazepril (LOTENSIN) 10 MG tablet Take 10 mg by mouth at bedtime.    [provider]  bisacodyl  (DULCOLAX) 10 MG suppository Place 10 mg rectally as needed for moderate constipation.    [provider]  brompheniramine-pseudoephedrine-dextromethorphan (DIMETAPP DM) 15-1-5 MG/5ML ELIX Take by mouth every 6 (six) hours as needed.    [provider]  calcitonin, salmon, (MIACALCIN/FORTICAL) 200 UNIT/ACT nasal spray Place 1 spray into alternate nostrils daily.    [provider]  Cholecalciferol (VITAMIN D3) 50 MCG (2000 UT) TABS Take 2,000 Units by mouth every morning.    [provider]  ciclopirox (PENLAC) 8 % solution Apply 1 application topically See admin instructions. Apply topically to toenails every Wednesday evening 01/08/21   [provider]  Cold Sore Products (BLISTEX EX) Apply topically.    [provider]  Diethyltoluamide (OFF ACTIVE EX) Apply topically.    [provider]  diphenhydrAMINE (BENADRYL) 25 MG tablet Take 25 mg by mouth as needed.    [provider]  Docosanol 10 % CREA Apply topically.    [provider]  doxycycline (VIBRAMYCIN) 50 MG capsule Take 50 mg by mouth daily.    [provider]  guaiFENesin (ROBITUSSIN) 100 MG/5ML liquid Take 5 mLs by mouth every 4 (four) hours as needed for cough or to loosen phlegm.    [provider]  Homeopathic Products (MURINE EAR WAX RELIEF OT) Place in ear(s).    [provider]  hydrocortisone cream 1 % Apply 1 application topically 2 (two) times daily.  [provider]  Infant Care Products (JOHNSONS BABY SHAMPOO) SHAM Apply 1 application topically See admin instructions. Mix 1/2 water and 1/2 shampoo - use to scrub eyebrows and eyelids every morning    [provider]  lacosamide (VIMPAT) 200 MG TABS tablet Take 1 tablet (200 mg total) by mouth 2 (two) times daily. 07/01/22   Levert Feinstein, MD  lactulose (CHRONULAC) 10 GM/15ML solution Take 10 g by mouth every morning.    [provider]  levothyroxine  (SYNTHROID) 88 MCG tablet Take 88 mcg by mouth every morning. 01/16/21   [provider]  loperamide (IMODIUM A-D) 2 MG tablet Take 4 mg by mouth as needed for diarrhea or loose stools.    [provider]  magnesium hydroxide (MILK OF MAGNESIA) 400 MG/5ML suspension Take by mouth daily as needed for mild constipation.    [provider]  Omega-3 Fatty Acids (FISH OIL) 500 MG CAPS Take 1,500 mg by mouth every morning.    [provider]  promethazine (PHENERGAN) 25 MG tablet Take 25 mg by mouth as needed for nausea or vomiting.    [provider]  pyridOXINE (VITAMIN B-6) 100 MG tablet Take 100 mg by mouth every morning.    [provider]  rivastigmine (EXELON) 4.6 mg/24hr Place 4.6 mg onto the skin daily.    [provider]  Soap & Cleansers (PHISODERM EX) Apply 1 application topically 2 (two) times daily. Apply to face    [provider]  Sunscreens (COPPERTONE DEFEND & CARE EX) Apply topically.    [provider]  Tetrahydrozoline HCl (VISINE OP) Place 1 drop into both eyes 3 (three) times daily.    [provider]  tolnaftate (TINACTIN) 1 % spray Apply 1 application topically See admin instructions. Spray topically between toes daily at bedtime    [provider]  Zinc Oxide (BALMEX EX) Apply topically.    [provider]      Allergies    Clonidine derivatives    Review of Systems   Review of Systems  Unable to perform ROS: Patient nonverbal    Physical Exam Updated Vital Signs BP 118/60   Pulse (!) 59   Temp (!) 96.6 F (35.9 C) (Axillary)   Resp 16   Ht 5' (1.524 m)   Wt 74.8 kg   SpO2 100%   BMI 32.22 kg/m  Physical Exam Vitals and nursing note reviewed.  Constitutional:      Comments: Chronically ill male laying in bed in no acute discomfort.  HENT:     Head:     Comments: 3 cm horizontal laceration noted to mid forehead without any crepitus and not actively  bleeding.  No raccoon's eyes or battle signs, no septal hematoma, no malocclusion. Eyes:     Extraocular Movements: Extraocular movements intact.     Pupils: Pupils are equal, round, and reactive to light.  Neck:     Comments: No midline spine tenderness Cardiovascular:     Rate and Rhythm: Normal rate and regular rhythm.     Pulses: Normal pulses.     Heart sounds: Normal heart sounds.  Pulmonary:     Effort: Pulmonary effort is normal.     Breath sounds: Normal breath sounds.  Abdominal:     Palpations: Abdomen is soft.     Tenderness: There is no abdominal tenderness.  Musculoskeletal:        General: Tenderness (Ecchymosis noted to right hip/thigh with tenderness to palpation but  no deformity.  Small amount of ecchymosis noted to lateral abdominal wall without any abdominal.) and signs of injury (Right knee: Abrasion no to the anterior knee with minimal tender to palpation without deformity) present.     Cervical back: Normal range of motion and neck supple.  Neurological:     Mental Status: Mental status is at baseline.     ED Results / Procedures / Treatments   Labs (all labs ordered are listed, but only abnormal results are displayed) Labs Reviewed  BASIC METABOLIC PANEL - Abnormal; Notable for the following components:      Result Value   Calcium 8.8 (*)    All other components within normal limits  CBC - Abnormal; Notable for the following components:   RBC 4.02 (*)    MCV 102.2 (*)    MCH 34.8 (*)    All other components within normal limits  URINALYSIS, ROUTINE W REFLEX MICROSCOPIC  CBG MONITORING, ED    EKG None ED ECG REPORT   Date: 09/24/2022  Rate: 69  Rhythm: normal sinus rhythm  QRS Axis: left  Intervals: normal  ST/T Wave abnormalities: nonspecific ST changes  Conduction Disutrbances:right bundle branch block  Narrative Interpretation:   Old EKG Reviewed: unchanged  I have personally reviewed the EKG tracing and agree with the computerized  printout as noted.   Radiology DG Hip Unilat W or Wo Pelvis 2-3 Views Right  Result Date: 09/24/2022 CLINICAL DATA:  Pain and fall EXAM: DG HIP (WITH OR WITHOUT PELVIS) 3V RIGHT COMPARISON:  None Available. FINDINGS: No fracture or dislocation. Preserved joint spaces. Small osteophytes along the sacroiliac joints. There are some well rounded density seen in the pelvis which are indeterminate although possibly vascular. IMPRESSION: Mild degenerative changes of the sacroiliac joints. No acute osseous abnormality Electronically Signed   By: Karen Kays M.D.   On: 09/24/2022 10:09   DG Knee Complete 4 Views Right  Result Date: 09/24/2022 CLINICAL DATA:  Unwitnessed fall. EXAM: RIGHT KNEE - COMPLETE 4+ VIEW COMPARISON:  None Available. FINDINGS: Chronic posttraumatic deformity noted proximal fibula. No acute bony abnormality. No evidence for joint effusion. Bones are demineralized. IMPRESSION: 1. No acute bony findings. 2. Chronic posttraumatic deformity of the proximal fibula. Electronically Signed   By: Kennith Center M.D.   On: 09/24/2022 07:22   CT HEAD WO CONTRAST  Result Date: 09/24/2022 CLINICAL DATA:  Facial trauma EXAM: CT HEAD WITHOUT CONTRAST CT CERVICAL SPINE WITHOUT CONTRAST TECHNIQUE: Multidetector CT imaging of the head and cervical spine was performed following the standard protocol without intravenous contrast. Multiplanar CT image reconstructions of the cervical spine were also generated. RADIATION DOSE REDUCTION: This exam was performed according to the departmental dose-optimization program which includes automated exposure control, adjustment of the mA and/or kV according to patient size and/or use of iterative reconstruction technique. COMPARISON:  06/12/2022 FINDINGS: CT HEAD FINDINGS Brain: No evidence of acute infarction, hemorrhage, hydrocephalus, extra-axial collection or mass lesion/mass effect. Generalized atrophy. Vascular: No hyperdense vessel or unexpected calcification.  Skull: Normal. Negative for fracture or focal lesion. Sinuses/Orbits: No evidence of injury CT CERVICAL SPINE FINDINGS Alignment: No traumatic malalignment degenerative anterolisthesis at C2-3 Skull base and vertebrae: No acute fracture. No primary bone lesion or focal pathologic process. Soft tissues and spinal canal: No prevertebral fluid or swelling. No visible canal hematoma. Disc levels: Multilevel disc collapse and endplate degeneration with ridging. Foraminal stenosis greatest at C3-4. Upper chest: Negative IMPRESSION: No evidence of intracranial or cervical spine injury. Electronically Signed  By: Tiburcio Pea M.D.   On: 09/24/2022 07:16   CT Cervical Spine Wo Contrast  Result Date: 09/24/2022 CLINICAL DATA:  Facial trauma EXAM: CT HEAD WITHOUT CONTRAST CT CERVICAL SPINE WITHOUT CONTRAST TECHNIQUE: Multidetector CT imaging of the head and cervical spine was performed following the standard protocol without intravenous contrast. Multiplanar CT image reconstructions of the cervical spine were also generated. RADIATION DOSE REDUCTION: This exam was performed according to the departmental dose-optimization program which includes automated exposure control, adjustment of the mA and/or kV according to patient size and/or use of iterative reconstruction technique. COMPARISON:  06/12/2022 FINDINGS: CT HEAD FINDINGS Brain: No evidence of acute infarction, hemorrhage, hydrocephalus, extra-axial collection or mass lesion/mass effect. Generalized atrophy. Vascular: No hyperdense vessel or unexpected calcification. Skull: Normal. Negative for fracture or focal lesion. Sinuses/Orbits: No evidence of injury CT CERVICAL SPINE FINDINGS Alignment: No traumatic malalignment degenerative anterolisthesis at C2-3 Skull base and vertebrae: No acute fracture. No primary bone lesion or focal pathologic process. Soft tissues and spinal canal: No prevertebral fluid or swelling. No visible canal hematoma. Disc levels:  Multilevel disc collapse and endplate degeneration with ridging. Foraminal stenosis greatest at C3-4. Upper chest: Negative IMPRESSION: No evidence of intracranial or cervical spine injury. Electronically Signed   By: Tiburcio Pea M.D.   On: 09/24/2022 07:16    Procedures .Marland KitchenLaceration Repair  Date/Time: 09/24/2022 8:41 AM  Performed by: Fayrene Helper, PA-C Authorized by: Fayrene Helper, PA-C   Consent:    Consent obtained:  Verbal   Consent given by:  Guardian   Risks, benefits, and alternatives were discussed: yes     Risks discussed:  Infection, poor cosmetic result, poor wound healing and pain   Alternatives discussed:  No treatment and referral Universal protocol:    Procedure explained and questions answered to patient or proxy's satisfaction: yes     Relevant documents present and verified: yes     Test results available: yes     Imaging studies available: yes   Anesthesia:    Anesthesia method:  Topical application and local infiltration   Topical anesthetic:  LET   Local anesthetic:  Lidocaine 2% WITH epi Laceration details:    Location:  Face   Face location:  Forehead   Length (cm):  3   Depth (mm):  8 Pre-procedure details:    Preparation:  Patient was prepped and draped in usual sterile fashion and imaging obtained to evaluate for foreign bodies Exploration:    Limited defect created (wound extended): no     Hemostasis achieved with:  Epinephrine and LET   Imaging outcome: foreign body not noted     Wound exploration: wound explored through full range of motion and entire depth of wound visualized     Wound extent: no underlying fracture     Contaminated: no   Treatment:    Area cleansed with:  Saline   Amount of cleaning:  Standard   Irrigation solution:  Sterile saline   Irrigation method:  Pressure wash   Visualized foreign bodies/material removed: no     Debridement:  None   Undermining:  None Skin repair:    Repair method:  Sutures   Suture size:  5-0    Suture material:  Prolene   Suture technique:  Simple interrupted   Number of sutures:  4 Approximation:    Approximation:  Close Repair type:    Repair type:  Intermediate Post-procedure details:    Dressing:  Non-adherent dressing   Procedure completion:  Tolerated well, no immediate complications     Medications Ordered in ED Medications  oxyCODONE-acetaminophen (PERCOCET/ROXICET) 5-325 MG per tablet 1 tablet (1 tablet Oral Given 09/24/22 0737)  Tdap (BOOSTRIX) injection 0.5 mL (0.5 mLs Intramuscular Given 09/24/22 0734)  lidocaine-EPINEPHrine-tetracaine (LET) topical gel (3 mLs Topical Given 09/24/22 0746)    ED Course/ Medical Decision Making/ A&P                             Medical Decision Making Amount and/or Complexity of Data Reviewed Labs: ordered. Radiology: ordered.  Risk Prescription drug management.   BP 118/60   Pulse (!) 59   Temp (!) 96.6 F (35.9 C) (Axillary)   Resp 16   Ht 5' (1.524 m)   Wt 74.8 kg   SpO2 100%   BMI 32.22 kg/m   77:14 AM  57 year old male with significant history of Down syndrome, has a legal guardian, lives in a group home, has history of seizure disorder and hypertension who was brought here via EMS from the group home after an unwitnessed fall.  Patient is nonverbal, normally requires heavy assistance with ambulation, who was found on the ground this morning by staff.  It appears he has fallen and struck his forehead as the staff noticed a laceration to his forehead.  He is not on any blood thinning medication.  EMS was able to get patient to the ambulance with assistance.  Unsure last tetanus status.  History is limited due to his nonverbal status.  Level 5 caveat's apply.  EMS report patient is at his neurological baseline.  Exam is notable for a horizontal laceration to the mid forehead without any crepitus and is not actively bleeding.  He also has skin abrasion noted to his right anterior knee.  Patient has had hematoma to right  lateral hip/thigh.  Faint bruising noted to lateral abdominal wall proximal to right thigh.  Due to being nonverbal, it is difficult to obtain history.  Will obtain head and cervical spine CT scan, will check basic labs, including EKG and urinalysis.  Pain medication given.  Will update tetanus as well.  Does have known history of seizures but no report of any seizure activities and patient does receive his medication on a regular basis.  Vital sign review notable for mild bradycardia with a heart rate of 59, or temperature of 96.6.    10:19 AM -Labs ordered, independently viewed and interpreted by me.  Labs remarkable for UA without evidence of UTI, electrolyte panels are reassuring, normal WBC, normal H&H, normal CBG -The patient was maintained on a cardiac monitor.  I personally viewed and interpreted the cardiac monitored which showed an underlying rhythm of: Normal sinus rhythm -Imaging independently viewed and interpreted by me and I agree with radiologist's interpretation.  Result remarkable for x-ray of the right hip and pelvis along with x-ray of the right knee without acute bony finding.  Head and cervical spine CT without intracranial injury or skull fracture no spinal injury. -This patient presents to the ED for concern of fall, this involves an extensive number of treatment options, and is a complaint that carries with it a high risk of complications and morbidity.  The differential diagnosis includes intracranial injury, laceration, fracture, dislocation, internal injury -Co morbidities that complicate the patient evaluation includes Down syndrome, nonverbal, gait instability -Treatment includes Percocet -Reevaluation of the patient after these medicines showed that the patient shows improvement -PCP office notes or  outside notes reviewed -Discussion with specialist attending Dr. Donnald Garre -Escalation to admission/observation considered: care giver feels much better, is comfortable with  discharge, and will follow up with PCP -Prescription medication considered, patient comfortable with tylenol -Social Determinant of Health considered          Final Clinical Impression(s) / ED Diagnoses Final diagnoses:  Laceration of forehead, initial encounter  Contusion of right hip, initial encounter  Contusion of right knee, initial encounter    Rx / DC Orders ED Discharge Orders     None         Fayrene Helper, PA-C 09/24/22 1054    Arby Barrette, MD 09/28/22 1243

## 2022-09-24 NOTE — ED Triage Notes (Addendum)
BIB [facility] from [facility] after unwitnessed fall. Laceration noted to forehead, bleeding controlled upon arrival. Pt at baseline neurologically per group home staff - non verbal, nystagmus. Staff reports that pt is unable to walk on his own and may have attempted to get out of bed and fell. Pt alert upon arrival. No blood thinners on [facility] sent with patient.  Group home address: [address] [place] [address]

## 2022-12-22 ENCOUNTER — Ambulatory Visit: Payer: Medicare Other | Admitting: Adult Health

## 2023-01-11 ENCOUNTER — Emergency Department (HOSPITAL_COMMUNITY): Payer: Medicare Other

## 2023-01-11 ENCOUNTER — Emergency Department (HOSPITAL_COMMUNITY)
Admission: EM | Admit: 2023-01-11 | Discharge: 2023-01-11 | Disposition: A | Discharge status: Home / Self Care | Payer: Medicare Other | Attending: Emergency Medicine | Admitting: Emergency Medicine

## 2023-01-11 DIAGNOSIS — Z20822 Contact with and (suspected) exposure to covid-19: Secondary | ICD-10-CM | POA: Diagnosis not present

## 2023-01-11 DIAGNOSIS — R5383 Other fatigue: Secondary | ICD-10-CM | POA: Diagnosis present

## 2023-01-11 DIAGNOSIS — R400 Somnolence: Secondary | ICD-10-CM | POA: Insufficient documentation

## 2023-01-11 DIAGNOSIS — F0390 Unspecified dementia without behavioral disturbance: Secondary | ICD-10-CM | POA: Diagnosis not present

## 2023-01-11 LAB — I-STAT CG4 LACTIC ACID, ED: Lactic Acid, Venous: 0.6 mmol/L (ref 0.5–1.9)

## 2023-01-11 LAB — CBC WITH DIFFERENTIAL/PLATELET
Abs Immature Granulocytes: 0.01 10*3/uL (ref 0.00–0.07)
Basophils Absolute: 0.1 10*3/uL (ref 0.0–0.1)
Basophils Relative: 1 %
Eosinophils Absolute: 0.1 10*3/uL (ref 0.0–0.5)
Eosinophils Relative: 1 %
HCT: 40.3 % (ref 39.0–52.0)
Hemoglobin: 13.6 g/dL (ref 13.0–17.0)
Immature Granulocytes: 0 %
Lymphocytes Relative: 45 %
Lymphs Abs: 1.8 10*3/uL (ref 0.7–4.0)
MCH: 34.5 pg — ABNORMAL HIGH (ref 26.0–34.0)
MCHC: 33.7 g/dL (ref 30.0–36.0)
MCV: 102.3 fL — ABNORMAL HIGH (ref 80.0–100.0)
Monocytes Absolute: 0.5 10*3/uL (ref 0.1–1.0)
Monocytes Relative: 11 %
Neutro Abs: 1.8 10*3/uL (ref 1.7–7.7)
Neutrophils Relative %: 42 %
Platelets: 193 10*3/uL (ref 150–400)
RBC: 3.94 MIL/uL — ABNORMAL LOW (ref 4.22–5.81)
RDW: 13.1 % (ref 11.5–15.5)
WBC: 4.2 10*3/uL (ref 4.0–10.5)
nRBC: 0 % (ref 0.0–0.2)

## 2023-01-11 LAB — COMPREHENSIVE METABOLIC PANEL
ALT: 26 U/L (ref 0–44)
AST: 27 U/L (ref 15–41)
Albumin: 3.2 g/dL — ABNORMAL LOW (ref 3.5–5.0)
Alkaline Phosphatase: 79 U/L (ref 38–126)
Anion gap: 7 (ref 5–15)
BUN: 15 mg/dL (ref 6–20)
CO2: 26 mmol/L (ref 22–32)
Calcium: 8.8 mg/dL — ABNORMAL LOW (ref 8.9–10.3)
Chloride: 103 mmol/L (ref 98–111)
Creatinine, Ser: 0.75 mg/dL (ref 0.61–1.24)
GFR, Estimated: >60 mL/min (ref >60)
Glucose, Bld: 98 mg/dL (ref 70–99)
Potassium: 4.2 mmol/L (ref 3.5–5.1)
Sodium: 136 mmol/L (ref 135–145)
Total Bilirubin: 0.6 mg/dL (ref 0.3–1.2)
Total Protein: 6.5 g/dL (ref 6.5–8.1)

## 2023-01-11 LAB — URINALYSIS, ROUTINE W REFLEX MICROSCOPIC
Bilirubin Urine: NEGATIVE
Glucose, UA: NEGATIVE mg/dL
Hgb urine dipstick: NEGATIVE
Ketones, ur: NEGATIVE mg/dL
Leukocytes,Ua: NEGATIVE
Nitrite: NEGATIVE
Protein, ur: NEGATIVE mg/dL
Specific Gravity, Urine: 1.005 (ref 1.005–1.030)
pH: 6 (ref 5.0–8.0)

## 2023-01-11 LAB — SARS CORONAVIRUS 2 BY RT PCR: SARS Coronavirus 2 by RT PCR: NEGATIVE

## 2023-01-11 MED ORDER — LORAZEPAM 2 MG/ML IJ SOLN
1.0000 mg | Freq: Once | INTRAMUSCULAR | Status: AC
  Administered 2023-01-11: 1 mg via INTRAVENOUS
  Filled 2023-01-11: qty 1

## 2023-01-11 MED ORDER — IOHEXOL 300 MG/ML  SOLN
100.0000 mL | Freq: Once | INTRAMUSCULAR | Status: AC | PRN
  Administered 2023-01-11: 75 mL via INTRAVENOUS

## 2023-01-11 NOTE — ED Provider Notes (Signed)
[name] EMERGENCY DEPARTMENT AT [facility] Provider Note   CSN: [id number] Arrival date & time: 01/11/23  1236     History  Chief Complaint  Patient presents with   Fatigue    [name] is a 57 y.o. male.  57 year old male presents with caregiver for concern of somnolence over the past week or so.  Caregiver states that couple weeks ago couple other guests in the group home had COVID.  Patient does not have any apparent complaints such as URI symptoms.  No witnessed falls.  I also spoke to the manager of the group home [name]).  She states that patient's underlying dementia is getting worse and he recently started on a medication about 1 month ago.  No other changes to medications.  Nonverbal at baseline.  The history is provided by the patient. No language interpreter was used.       Home Medications Prior to Admission medications   Medication Sig Start Date End Date Taking? Authorizing Provider  acetaminophen (TYLENOL) 160 MG/5ML solution Take 20 mLs by mouth every 4 (four) hours as needed.    [provider]  acetaminophen (TYLENOL) 650 MG CR tablet Take 650 mg by mouth 3 (three) times daily.    [provider]  aluminum hydroxide-magnesium carbonate (ACID GONE) 95-358 MG/15ML SUSP Take by mouth.    [provider]  Artificial Tear Ointment (LACRI-LUBE OP) Place 1 application into both eyes at bedtime.    [provider]  atorvastatin (LIPITOR) 10 MG tablet Take 10 mg by mouth daily.    [provider]  benazepril (LOTENSIN) 10 MG tablet Take 10 mg by mouth at bedtime.    [provider]  bisacodyl (DULCOLAX) 10 MG suppository Place 10 mg rectally as needed for moderate constipation.    [provider]  brompheniramine-pseudoephedrine-dextromethorphan (DIMETAPP DM) 15-1-5 MG/5ML ELIX Take by mouth every 6 (six) hours as needed.    [provider]  calcitonin, salmon,  (MIACALCIN/FORTICAL) 200 UNIT/ACT nasal spray Place 1 spray into alternate nostrils daily.    [provider]  Cholecalciferol (VITAMIN D3) 50 MCG (2000 UT) TABS Take 2,000 Units by mouth every morning.    [provider]  ciclopirox (PENLAC) 8 % solution Apply 1 application topically See admin instructions. Apply topically to toenails every Wednesday evening 01/08/21   [provider]  Cold Sore Products (BLISTEX EX) Apply topically.    [provider]  Diethyltoluamide (OFF ACTIVE EX) Apply topically.    [provider]  diphenhydrAMINE (BENADRYL) 25 MG tablet Take 25 mg by mouth as needed.    [provider]  Docosanol 10 % CREA Apply topically.    [provider]  doxycycline (VIBRAMYCIN) 50 MG capsule Take 50 mg by mouth daily.    [provider]  guaiFENesin (ROBITUSSIN) 100 MG/5ML liquid Take 5 mLs by mouth every 4 (four) hours as needed for cough or to loosen phlegm.    [provider]  Homeopathic Products (MURINE EAR WAX RELIEF OT) Place in ear(s).    [provider]  hydrocortisone cream 1 % Apply 1 application topically 2 (two) times daily.    [provider]  Infant Care Products (JOHNSONS BABY SHAMPOO) SHAM Apply 1 application topically See admin instructions. Mix 1/2 water and 1/2 shampoo - use to scrub eyebrows and eyelids every morning    [provider]  lacosamide (VIMPAT) 200 MG TABS tablet Take 1 tablet (200 mg total) by  mouth 2 (two) times daily. 07/01/22   Levert Feinstein, MD  lactulose (CHRONULAC) 10 GM/15ML solution Take 10 g by mouth every morning.    [provider]  levothyroxine (SYNTHROID) 88 MCG tablet Take 88 mcg by mouth every morning. 01/16/21   [provider]  loperamide (IMODIUM A-D) 2 MG tablet Take 4 mg by mouth as needed for diarrhea or loose stools.    [provider]  magnesium hydroxide (MILK OF MAGNESIA) 400 MG/5ML suspension Take by  mouth daily as needed for mild constipation.    [provider]  Omega-3 Fatty Acids (FISH OIL) 500 MG CAPS Take 1,500 mg by mouth every morning.    [provider]  promethazine (PHENERGAN) 25 MG tablet Take 25 mg by mouth as needed for nausea or vomiting.    [provider]  pyridOXINE (VITAMIN B-6) 100 MG tablet Take 100 mg by mouth every morning.    [provider]  rivastigmine (EXELON) 4.6 mg/24hr Place 4.6 mg onto the skin daily.    [provider]  Soap & Cleansers (PHISODERM EX) Apply 1 application topically 2 (two) times daily. Apply to face    [provider]  Sunscreens (COPPERTONE DEFEND & CARE EX) Apply topically.    [provider]  Tetrahydrozoline HCl (VISINE OP) Place 1 drop into both eyes 3 (three) times daily.    [provider]  tolnaftate (TINACTIN) 1 % spray Apply 1 application topically See admin instructions. Spray topically between toes daily at bedtime    [provider]  Zinc Oxide (BALMEX EX) Apply topically.    [provider]      Allergies    Clonidine derivatives    Review of Systems   Review of Systems  Unable to perform ROS: Dementia  Constitutional:  Positive for fatigue. Negative for activity change and fever.  Neurological:  Negative for weakness.    Physical Exam Updated Vital Signs BP (!) 148/67 (BP Location: Right Arm)   Pulse (!) 55   Temp 97.7 F (36.5 C) (Oral)   Resp 16   SpO2 100%  Physical Exam Vitals and nursing note reviewed.  Constitutional:      General: He is not in acute distress.    Appearance: Normal appearance. He is not ill-appearing.  HENT:     Head: Normocephalic and atraumatic.     Nose: Nose normal.  Eyes:     General: No scleral icterus.    Extraocular Movements: Extraocular movements intact.     Conjunctiva/sclera: Conjunctivae normal.  Cardiovascular:     Rate and Rhythm: Normal rate and regular rhythm.     Heart sounds:  Normal heart sounds.  Pulmonary:     Effort: Pulmonary effort is normal. No respiratory distress.     Breath sounds: Normal breath sounds. No wheezing or rales.  Abdominal:     General: There is no distension.     Tenderness: There is no abdominal tenderness.  Musculoskeletal:        General: Normal range of motion.     Cervical back: Normal range of motion.  Skin:    General: Skin is warm and dry.  Neurological:     General: No focal deficit present.     Mental Status: He is alert. Mental status is at baseline.     ED Results / Procedures / Treatments   Labs (all labs ordered are listed, but only abnormal results are displayed) Labs Reviewed  COMPREHENSIVE METABOLIC PANEL - Abnormal;  Notable for the following components:      Result Value   Calcium 8.8 (*)    Albumin 3.2 (*)    All other components within normal limits  CBC WITH DIFFERENTIAL/PLATELET - Abnormal; Notable for the following components:   RBC 3.94 (*)    MCV 102.3 (*)    MCH 34.5 (*)    All other components within normal limits  SARS CORONAVIRUS 2 BY RT PCR  URINALYSIS, ROUTINE W REFLEX MICROSCOPIC  I-STAT CG4 LACTIC ACID, ED  I-STAT CG4 LACTIC ACID, ED    EKG None  Radiology DG Chest Port 1 View  Result Date: 01/11/2023 CLINICAL DATA:  Fatigue EXAM: PORTABLE CHEST 1 VIEW COMPARISON:  02/15/2021 x-ray and older FINDINGS: Kyphotic x-ray obscures the apices. Underinflation. No pneumothorax, effusion or edema. Slight hazy opacity at the left lung base. Subtle infiltrates not excluded. Stable cardiopericardial silhouette. Fixation hardware along the right humerus at the edge of the imaging field. Multiple air-filled loops of bowel seen beneath the right hemidiaphragm. IMPRESSION: Underinflation with mild hazy opacity in the left lung base. Subtle infiltrates not excluded. Recommend follow-up. Kyphotic x-ray obscures the apices. Electronically Signed   By: Karen Kays M.D.   On: 01/11/2023 14:25     Procedures Procedures    Medications Ordered in ED Medications - No data to display  ED Course/ Medical Decision Making/ A&P Clinical Course as of 01/11/23 1509  Tue Jan 11, 2023  1505 FU on CT head, if neg dc home [BH]    Clinical Course User Index [BH] Henderly, Britni A, PA-C                                 Medical Decision Making Amount and/or Complexity of Data Reviewed Labs: ordered. Radiology: ordered.   57 year old male presents with his caregiver for evaluation of fatigue and somnolence.  Ongoing for about a week.  Will obtain labs, urine, as well as CT head to rule out acute etiology of patient's symptoms.  However during my evaluation he does not appear somnolent, has not fallen asleep during our interview, and does not appear to be in any distress.  Although he has not had a witnessed fall.  CBC without anemia or leukocytosis.  CMP with normal electrolytes, and preserved renal function.  Lactic acid is normal.  COVID-negative.  UA without evidence of UTI.  X-ray without gross cardiopulmonary process.  Cannot rule out subtle infiltrate.  However given he is afebrile, without cough, and without leukocytosis low suspicion for pneumonia.  CT head pending.  Signed out to oncoming provider to follow-up on CT head.  If this is negative patient is appropriate for discharge back to group home.  Caregiver is aware of plan and is agreeable.  Final Clinical Impression(s) / ED Diagnoses Final diagnoses:  Fatigue, unspecified type    Rx / DC Orders ED Discharge Orders     None         Marita Kansas, PA-C 01/11/23 1509    Rondel Baton, MD 01/11/23 [phone]

## 2023-01-11 NOTE — ED Provider Notes (Signed)
Care assumed from previous provider, see note for full HPI  In summation 57 year old history of Down syndrome here for evaluation of fatigue.  He has been more fatigued over the last 3 weeks since being diagnosed with COVID.  He is no infectious symptoms.  Workup thus far unremarkable.  Waiting on head CT.  If negative DC home.  Does have underlying dementia, nonverbal at baseline  No recent falls or injuries per group home Physical Exam  BP 116/79 (BP Location: Right Arm)   Pulse 62   Temp (!) 96.3 F (35.7 C) (Axillary)   Resp 17   SpO2 97%   Physical Exam Vitals and nursing note reviewed.  Constitutional:      General: He is not in acute distress.    Appearance: He is well-developed. He is not ill-appearing, toxic-appearing or diaphoretic.  HENT:     Head: Atraumatic.  Eyes:     Pupils: Pupils are equal, round, and reactive to light.  Cardiovascular:     Rate and Rhythm: Normal rate and regular rhythm.     Pulses: Normal pulses.     Heart sounds: Normal heart sounds.  Pulmonary:     Effort: Pulmonary effort is normal. No respiratory distress.     Breath sounds: Normal breath sounds.  Abdominal:     General: Bowel sounds are normal. There is no distension.     Palpations: Abdomen is soft.  Musculoskeletal:        General: Normal range of motion.     Cervical back: Normal range of motion and neck supple.  Skin:    General: Skin is warm and dry.  Neurological:     General: No focal deficit present.     Mental Status: He is alert and oriented to person, place, and time. Mental status is at baseline.     Cranial Nerves: No cranial nerve deficit.     Sensory: No sensory deficit.     Motor: No weakness.     Coordination: Coordination normal.     Gait: Gait normal.    Procedures  Procedures Labs Reviewed  COMPREHENSIVE METABOLIC PANEL - Abnormal; Notable for the following components:      Result Value   Calcium 8.8 (*)    Albumin 3.2 (*)    All other components  within normal limits  CBC WITH DIFFERENTIAL/PLATELET - Abnormal; Notable for the following components:   RBC 3.94 (*)    MCV 102.3 (*)    MCH 34.5 (*)    All other components within normal limits  URINALYSIS, ROUTINE W REFLEX MICROSCOPIC - Abnormal; Notable for the following components:   Color, Urine STRAW (*)    All other components within normal limits  SARS CORONAVIRUS 2 BY RT PCR  I-STAT CG4 LACTIC ACID, ED   CT Soft Tissue Neck W Contrast  Result Date: 01/11/2023 CLINICAL DATA:  Neck mass, follow-up PET-CT EXAM: CT NECK WITH CONTRAST TECHNIQUE: Multidetector CT imaging of the neck was performed using the standard protocol following the bolus administration of intravenous contrast. RADIATION DOSE REDUCTION: This exam was performed according to the departmental dose-optimization program which includes automated exposure control, adjustment of the mA and/or kV according to patient size and/or use of iterative reconstruction technique. CONTRAST:  75mL OMNIPAQUE IOHEXOL 300 MG/ML  SOLN COMPARISON:  Same-day noncontrast head CT, CT cervical spine 12/11/2020 FINDINGS: Pharynx and larynx: There is a calcified lesion in the right nasopharynx in the fossa of Rosenmuller measuring up to 1.3 cm, unchanged since the  CT cervical spine from 12/11/2020 and 03/26/2018. The lesion was also present on the CT head from 2011 and is consistent with benign etiology given long-term stability. The nasal cavity and nasopharynx are otherwise unremarkable. The oral cavity and oropharynx are unremarkable. The parapharyngeal spaces are clear. The hypopharynx and larynx are unremarkable. The vocal folds are grossly normal. The epiglottis is unremarkable. There is no retropharyngeal collection. The airway is patent. Salivary glands: The parotid and submandibular glands are unremarkable. Thyroid: Unremarkable. Lymph nodes: There is no pathologic lymphadenopathy in the neck. Vascular: Unremarkable. There is no significant plaque  at the carotid bifurcations. Limited intracranial: Unremarkable. Visualized orbits: Unremarkable. Mastoids and visualized paranasal sinuses: Clear. Skeleton: There is advanced multilevel degenerative change throughout the cervical spine with severe disc space narrowing at C3-C4 and C5-C6, and mild anterolisthesis of C2 on C3 and retrolisthesis of C3 on C4. There is no definite high-grade spinal canal stenosis. Compression deformity of the superior T2 endplate is unchanged. There is no acute fracture or traumatic malalignment of the cervical spine. There is no visible canal hematoma. Upper chest: The imaged lung apices are clear. Other: None. IMPRESSION: 1. Calcified lesion in the right nasopharynx is unchanged going back to at least 2011, consistent with benign etiology given long-term stability. 2. No acute or suspicious finding in the neck. Electronically Signed   By: Lesia Hausen M.D.   On: 01/11/2023 17:02   CT Head Wo Contrast  Result Date: 01/11/2023 CLINICAL DATA:  Polytrauma, blunt. EXAM: CT HEAD WITHOUT CONTRAST TECHNIQUE: Contiguous axial images were obtained from the base of the skull through the vertex without intravenous contrast. RADIATION DOSE REDUCTION: This exam was performed according to the departmental dose-optimization program which includes automated exposure control, adjustment of the mA and/or kV according to patient size and/or use of iterative reconstruction technique. COMPARISON:  Head CT 09/24/2022. FINDINGS: Brain: No acute intracranial hemorrhage. Gray-white differentiation is preserved. Stable central pattern of volume loss. No acute hydrocephalus or extra-axial collection. No mass effect or midline shift. Vascular: No hyperdense vessel or unexpected calcification. Skull: No calvarial fracture or suspicious bone lesion. Skull base is unremarkable. Sinuses/Orbits: No acute finding. Other: Partially imaged, partially calcified mass along the right fossa of Rosenmuller, measuring up  to 14 mm. IMPRESSION: 1. No acute intracranial abnormality. 2. Incompletely imaged, partially calcified mass along the right fossa of Rosenmuller. Neck CT or MRI with contrast is recommended for further characterization. Electronically Signed   By: Orvan Falconer M.D.   On: 01/11/2023 15:08   DG Chest Port 1 View  Result Date: 01/11/2023 CLINICAL DATA:  Fatigue EXAM: PORTABLE CHEST 1 VIEW COMPARISON:  02/15/2021 x-ray and older FINDINGS: Kyphotic x-ray obscures the apices. Underinflation. No pneumothorax, effusion or edema. Slight hazy opacity at the left lung base. Subtle infiltrates not excluded. Stable cardiopericardial silhouette. Fixation hardware along the right humerus at the edge of the imaging field. Multiple air-filled loops of bowel seen beneath the right hemidiaphragm. IMPRESSION: Underinflation with mild hazy opacity in the left lung base. Subtle infiltrates not excluded. Recommend follow-up. Kyphotic x-ray obscures the apices. Electronically Signed   By: Karen Kays M.D.   On: 01/11/2023 14:25    ED Course / MDM   Clinical Course as of 01/12/23 0011  Tue Jan 11, 2023  1505 FU on CT head, if neg dc home [BH]    Clinical Course User Index [BH] Ronalda Walpole A, PA-C   Care assumed from previous provider, see note for full HPI  In summation  57 year old history of Down syndrome here for evaluation of fatigue.  He has been more fatigued over the last 3 weeks since being diagnosed with COVID.  He is no infectious symptoms.  Workup thus far unremarkable.  Waiting on head CT.  If negative DC home.  Imaging personally viewed interpreted Chronic changes on CT imaging  Discussed results with patient, caregiver as well as father in room.  Unremarkable workup.  He is sleepy after the Ativan which was needed to get appropriate CT scan.  Will let him sleep this off and discharge once more awake  Patient more awake.  At baseline per caregiver.  Will DC home.  The patient has been  appropriately medically screened and/or stabilized in the ED. I have low suspicion for any other emergent medical condition which would require further screening, evaluation or treatment in the ED or require inpatient management.  Patient is hemodynamically stable and in no acute distress.  Patient able to ambulate in department prior to ED.  Evaluation does not show acute pathology that would require ongoing or additional emergent interventions while in the emergency department or further inpatient treatment.  I have discussed the diagnosis with the patient and answered all questions.  Pain is been managed while in the emergency department and patient has no further complaints prior to discharge.  Patient is comfortable with plan discussed in room and is stable for discharge at this time.  I have discussed strict return precautions for returning to the emergency department.  Patient was encouraged to follow-up with PCP/specialist refer to at discharge.  Medical Decision Making Amount and/or Complexity of Data Reviewed Independent Historian: caregiver External Data Reviewed: labs, radiology and notes. Labs: ordered. Decision-making details documented in ED Course. Radiology: ordered and independent interpretation performed. Decision-making details documented in ED Course.  Risk OTC drugs. Prescription drug management. Diagnosis or treatment significantly limited by social determinants of health.         Linwood Dibbles, PA-C 01/12/23 0011    Terald Sleeper, MD 01/12/23 1714

## 2023-01-11 NOTE — Discharge Instructions (Addendum)
Your workup today was overall reassuring.  Blood work did not show any signs of infection.  COVID is negative.  Chest x-ray without any obvious infection.  Your CT scan of the head did not show any concerning findings.  Follow-up with your primary care provider.

## 2023-01-11 NOTE — ED Triage Notes (Signed)
Pt BIBA from group home for fatigue x3 weeks. Staff reports he has been falling asleep at meals and weak. Covid went around group home about 3 weeks ago. Caregiver denies urinary sx or signs of pain.  139/107 CBG 989 98% RA HR 48 EKG unremarkable

## 2023-01-19 ENCOUNTER — Encounter: Payer: Self-pay | Admitting: Adult Health

## 2023-01-19 ENCOUNTER — Ambulatory Visit (INDEPENDENT_AMBULATORY_CARE_PROVIDER_SITE_OTHER): Payer: Medicare Other | Admitting: Adult Health

## 2023-01-19 VITALS — BP 92/61 | HR 52 | Ht 60.0 in

## 2023-01-19 DIAGNOSIS — G40909 Epilepsy, unspecified, not intractable, without status epilepticus: Secondary | ICD-10-CM | POA: Diagnosis not present

## 2023-01-19 DIAGNOSIS — Q909 Down syndrome, unspecified: Secondary | ICD-10-CM

## 2023-01-19 NOTE — Progress Notes (Unsigned)
PATIENT: [name] DOB: 10-07-1965  REASON FOR VISIT: follow up HISTORY FROM: patient PRIMARY NEUROLOGIST: Dr. Terrace Arabia  Chief Complaint  Patient presents with   Follow-up    Pt in 19 with caregiver Caregiver states no seizures since last visit  Caregiver states pt is fatigue,and jerking  Pt incontinent      HISTORY OF PRESENT ILLNESS: Today 01/19/23:  [name] is a 57 y.o. male with a history of down syndrome, seizures and dementia. Returns today for follow-up.  To work  06/03/22: [name] is a 57 year old male with a history of Down syndrome, seizures and dementia.  He returns today for follow-up.  At the last visit we decreased Vimpat to 100 mg in the morning and 200 mg at night due to involuntary jerking and gait disturbance.  Caregivers report that with the decrease in Vimpat involuntary jerking and gait disturbance did not improve or worsen.  Things have remained relatively stable.  They do have to be very cautious with his gait in order to prevent falls.  He returns today for an evaluation  11/12/21: [name] is a 57 year old male with a history of Down syndrome, seizures and dementia.  He returns today for follow-up.  He is currently taking Vimpat 200 mg BID. They still notice involuntary jerking. Had  a fall on memorial day.  Caregiver reports that he had a sudden jerk forward and fell.  Had some issues with urinary and bowel incontinence. Episodes are random and not associated with jerking.they now take him to the bathroom every 2 hours.  Sleeping more during the day and awake at night.    09/23/21: He is here with a caregiver. She doesn't think he has had any seizure events. She states that sometimes he will jerk forward and then will fall backwards. She describes as an aggressive sneeze except he is not sneezing. Jerks started after fall in February. Does feel that he frequency has decreased. Facility called our office in March complaining of jerking  episodes.  Vimpat at that point was increased to 200 mg twice a day however looking at his paperwork today he is still taking 150 mg twice a day.  Caregiver is not aware of any reason that he was not started on 200 mg twice a day  HISTORY (copied from Dr. Zannie Cove note)  [name] is a 57 year old male, seen in request by his primary care from [name] for evaluation of increased gait abnormality, he is accompanied by staff at today's visit on July 03, 2021.     Patient has Down syndrome, has been In facility for many years, per caregiver who has known him since 2022, he does have some regression, was noted to have increased gait abnormality, there was no recurrent seizure reported  He was seen by our clinic previously by Dr. Anne Hahn, last visit was with Aundra Millet in October 2019, long history of seizure, previously was treated with Depakote, and Keppra, reported elevated ammonia level, Depakote was switched to Vimpat, titrating dose to 150 mg twice a day now, there was also reported behavior issue with higher dose of Keppra,    Patient had hospital admission in September 2022 when she was found unresponsive, then became reactive but not himself, incontinence of urine, no seizure-like activity noted,   EEG by Dr. [name] on Feb 16 2021 This technically difficult study is consistent with patient's known history of epilepsy with generalized onset.There is also moderate diffuse encephalopathy,  non etiology. No seizures were seen throughout the recording.   I personally reviewed CT head without contrast, no acute abnormality, parenchymal loss, enlarged ventricle, advanced for age  CT of cervical spine, no acute abnormality, multilevel degenerative changes  CT of maxillary showed mildly displaced fracture of left mandibular condyle,  Laboratory evaluations in September 2022, normal procalcitonin, INR, CMP with mild decreased calcium 8.4, elevated alkaline phosphate  160, albumin 2.8, normal B12, folic acid, urinalysis showed rare bacteria, Keppra level was 6.5,  He was diagnosed with acute metabolic encephalopathy, Keppra dose was increased from 250 mg twice a day to 500 mg twice a day, remained on Vimpat 150 mg twice a day   REVIEW OF SYSTEMS: Out of a complete 14 system review of symptoms, the patient complains only of the following symptoms, and all other reviewed systems are negative.  ALLERGIES: Allergies  Allergen Reactions   Clonidine Derivatives Other (See Comments)    Causes symptomatic bradycardia with syncope and hypotension    HOME MEDICATIONS: Outpatient Medications Prior to Visit  Medication Sig Dispense Refill   acetaminophen (TYLENOL) 160 MG/5ML solution Take 20 mLs by mouth every 4 (four) hours as needed.     acetaminophen (TYLENOL) 650 MG CR tablet Take 650 mg by mouth 3 (three) times daily.     aluminum hydroxide-magnesium carbonate (ACID GONE) 95-358 MG/15ML SUSP Take by mouth.     Artificial Tear Ointment (LACRI-LUBE OP) Place 1 application into both eyes at bedtime.     atorvastatin (LIPITOR) 10 MG tablet Take 10 mg by mouth daily.     benazepril (LOTENSIN) 10 MG tablet Take 10 mg by mouth at bedtime.     bisacodyl (DULCOLAX) 10 MG suppository Place 10 mg rectally as needed for moderate constipation.     brompheniramine-pseudoephedrine-dextromethorphan (DIMETAPP DM) 15-1-5 MG/5ML ELIX Take by mouth every 6 (six) hours as needed.     calcitonin, salmon, (MIACALCIN/FORTICAL) 200 UNIT/ACT nasal spray Place 1 spray into alternate nostrils daily.     Cholecalciferol (VITAMIN D3) 50 MCG (2000 UT) TABS Take 2,000 Units by mouth every morning.     ciclopirox (PENLAC) 8 % solution Apply 1 application topically See admin instructions. Apply topically to toenails every Wednesday evening     Cold Sore Products (BLISTEX EX) Apply topically.     Diethyltoluamide (OFF ACTIVE EX) Apply topically.     diphenhydrAMINE (BENADRYL) 25 MG tablet  Take 25 mg by mouth as needed.     Docosanol 10 % CREA Apply topically.     doxycycline (VIBRAMYCIN) 50 MG capsule Take 50 mg by mouth daily.     guaiFENesin (ROBITUSSIN) 100 MG/5ML liquid Take 5 mLs by mouth every 4 (four) hours as needed for cough or to loosen phlegm.     Homeopathic Products (MURINE EAR WAX RELIEF OT) Place in ear(s).     hydrocortisone cream 1 % Apply 1 application topically 2 (two) times daily.     Infant Care Products (JOHNSONS BABY SHAMPOO) SHAM Apply 1 application topically See admin instructions. Mix 1/2 water and 1/2 shampoo - use to scrub eyebrows and eyelids every morning     lacosamide (VIMPAT) 200 MG TABS tablet Take 1 tablet (200 mg total) by mouth 2 (two) times daily. 180 tablet 3   lactulose (CHRONULAC) 10 GM/15ML solution Take 10 g by mouth every morning.     levothyroxine (SYNTHROID) 88 MCG tablet Take 88 mcg by mouth every morning.     loperamide (IMODIUM A-D) 2 MG tablet Take 4  mg by mouth as needed for diarrhea or loose stools.     magnesium hydroxide (MILK OF MAGNESIA) 400 MG/5ML suspension Take by mouth daily as needed for mild constipation.     Omega-3 Fatty Acids (FISH OIL) 500 MG CAPS Take 1,500 mg by mouth every morning.     promethazine (PHENERGAN) 25 MG tablet Take 25 mg by mouth as needed for nausea or vomiting.     pyridOXINE (VITAMIN B-6) 100 MG tablet Take 100 mg by mouth every morning.     rivastigmine (EXELON) 4.6 mg/24hr Place 4.6 mg onto the skin daily.     Soap & Cleansers (PHISODERM EX) Apply 1 application topically 2 (two) times daily. Apply to face     Sunscreens (COPPERTONE DEFEND & CARE EX) Apply topically.     Tetrahydrozoline HCl (VISINE OP) Place 1 drop into both eyes 3 (three) times daily.     tolnaftate (TINACTIN) 1 % spray Apply 1 application topically See admin instructions. Spray topically between toes daily at bedtime     Zinc Oxide (BALMEX EX) Apply topically.     No facility-administered medications prior to visit.     PAST MEDICAL HISTORY: Past Medical History:  Diagnosis Date   Down's syndrome    GERD (gastroesophageal reflux disease)    Hypertension    Hypothyroid    Lives in group home    [facility]-[phone]-fax-[phone]-Roxanne-nurse   Nystagmus    Obstructive sleep apnea (adult) (pediatric)    does use a cpap at night   Seizure disorder (HCC) 10/28/2016   Seizures (HCC)     PAST SURGICAL HISTORY: Past Surgical History:  Procedure Laterality Date   IRRIGATION AND DEBRIDEMENT  07/01/2014       ORIF HUMERUS FRACTURE Left 02/05/2013   Procedure: OPEN REDUCTION INTERNAL FIXATION (ORIF) MEDIAL HUMERUS CONDYLE FRACTURE;  Surgeon: Jodi Marble, MD;  Location: [facility];  Service: Orthopedics;  Laterality: Left;   Right Elbow surgery      FAMILY HISTORY: Family History  Problem Relation Age of Onset   Dementia Maternal Grandmother    Diabetes Paternal Grandfather    Heart failure Paternal Grandfather    Hypertension Paternal Grandfather    Seizures Neg Hx     SOCIAL HISTORY: Social History   Socioeconomic History   Marital status: Single    Spouse name: Not on file   Number of children: 0   Years of education: 12   Highest education level: Not on file  Occupational History   Occupation: Day [employer]  Tobacco Use   Smoking status: Never   Smokeless tobacco: Never  Vaping Use   Vaping status: Never Used  Substance and Sexual Activity   Alcohol use: No   Drug use: No   Sexual activity: Not Currently    Birth control/protection: None  Other Topics Concern   Not on file  Social History Narrative   Lives at a group home   Caffeine use: Tea/soda once per week   Right handed   Social Determinants of Health   Financial Resource Strain: Not on file  Food Insecurity: Not on file  Transportation Needs: Not on file  Physical Activity: Not on file  Stress: Not on file  Social Connections: Unknown (05/28/2022)   Received from [facility], [facility]   Social Network    Social Network: Not on file  Intimate Partner Violence: Unknown (05/28/2022)   Received from [facility], [facility]   HITS    Physically Hurt:  Not on file    Insult or Talk Down To: Not on file    Threaten Physical Harm: Not on file    Scream or Curse: Not on file      PHYSICAL EXAM  Vitals:   01/19/23 1039  BP: 92/61  Pulse: (!) 52  Height: 5' (1.524 m)   Body mass index is 32.22 kg/m.  Generalized: Well developed  Neurological examination  Mentation: Alert.  Nonverbal Cranial nerve II-XII:  Extraocular movements were full, visual field were full on confrontational test. Facial sensation and strength were normal. Head turning and shoulder shrug  were normal and symmetric. Motor: The motor testing reveals 5 over 5 strength of all 4 extremities.  Coordination: Unable to complete.  Gait and station: In a wheelchair today  DIAGNOSTIC DATA (LABS, IMAGING, TESTING) - I reviewed patient records, labs, notes, testing and imaging myself where available.  Lab Results  Component Value Date   WBC 4.2 01/11/2023   HGB 13.6 01/11/2023   HCT 40.3 01/11/2023   MCV 102.3 (H) 01/11/2023   PLT 193 01/11/2023      Component Value Date/Time   NA 136 01/11/2023 1251   NA 141 07/03/2021 1105   K 4.2 01/11/2023 1251   CL 103 01/11/2023 1251   CO2 26 01/11/2023 1251   GLUCOSE 98 01/11/2023 1251   BUN 15 01/11/2023 1251   BUN 15 07/03/2021 1105   CREATININE 0.75 01/11/2023 1251   CALCIUM 8.8 (L) 01/11/2023 1251   PROT 6.5 01/11/2023 1251   PROT 7.1 07/03/2021 1105   ALBUMIN 3.2 (L) 01/11/2023 1251   ALBUMIN 4.1 07/03/2021 1105   AST 27 01/11/2023 1251   ALT 26 01/11/2023 1251   ALKPHOS 79 01/11/2023 1251   BILITOT 0.6 01/11/2023 1251   BILITOT 0.5 07/03/2021 1105   GFRNONAA >60 01/11/2023 1251   GFRAA >60 02/23/2018 0945    Lab Results  Component Value Date   VITAMINB12 297 02/15/2021   Lab Results  Component Value Date   TSH 0.748  07/03/2021      ASSESSMENT AND PLAN 57 y.o. year old male  has a past medical history of Down's syndrome, GERD (gastroesophageal reflux disease), Hypertension, Hypothyroid, Lives in group home, Nystagmus, Obstructive sleep apnea (adult) (pediatric), Seizure disorder (HCC) (10/28/2016), and Seizures (HCC). here with:  1.  Down syndrome 2.  Seizures  - Continue Vimpat 100 mg in the morning and 200 mg at night -Continue to monitor symptoms -Encouraged the patient caregiver to try to keep him as safe as possible in regards to falling -Follow-up in 6 months or sooner if needed      [name], MSN, NP-C 01/19/2023, 10:52 AM [facility] [address], [address] [place], [place] [address] [phone]

## 2023-01-20 NOTE — Progress Notes (Signed)
Chart reviewed, agree above plan ?

## 2023-01-25 ENCOUNTER — Telehealth: Payer: Self-pay | Admitting: *Deleted

## 2023-01-25 NOTE — Telephone Encounter (Addendum)
I tried to call the patient's facility but the connection was not clear.  Will try again tomorrow during office hours.  ----- Message from [name] sent at 01/20/2023  1:42 PM EDT ----- Please call the patient's nurse and discussed starting Onfi.  If they are amenable we can send a prescription in

## 2023-01-26 NOTE — Telephone Encounter (Signed)
I called [facility] today and had to leave a voicemail for his nurse, Aram Beecham.  I left a detailed voicemail regarding the new medication Onfi that Dr. Terrace Arabia recommends for seizure prophylaxis.  I asked for a call back and I provided our office number and office hours.

## 2023-01-27 NOTE — Telephone Encounter (Signed)
Spoke with Counsellor at [employer] about starting [employer].  She states she will call the patient's guardian to make sure they are okay with starting the new medication and then she will call us back to let us know.  If everyone is in agreement, we need to fax the prescription to them at [phone] so they can give it to the pharmacy.

## 2023-02-02 NOTE — Telephone Encounter (Signed)
I called [name] to follow-up. I LVM with office number and hours asking for call back.

## 2023-02-03 NOTE — Telephone Encounter (Signed)
I called [name] again to follow-up. I LVM on her office phone with our office number and hours asking for call back.

## 2023-02-08 ENCOUNTER — Other Ambulatory Visit: Payer: Self-pay | Admitting: Family Medicine

## 2023-02-08 ENCOUNTER — Ambulatory Visit
Admission: RE | Admit: 2023-02-08 | Discharge: 2023-02-08 | Disposition: A | Discharge status: Home / Self Care | Payer: Medicare Other | Source: Ambulatory Visit | Attending: Family Medicine | Admitting: Family Medicine

## 2023-02-08 DIAGNOSIS — R609 Edema, unspecified: Secondary | ICD-10-CM

## 2023-02-08 DIAGNOSIS — R238 Other skin changes: Secondary | ICD-10-CM

## 2023-02-08 NOTE — Telephone Encounter (Signed)
Rexanne Mano from [employer] is asking for a call from [place], [place] re: pt's Onfi, she asked that I send it as high priority please call [phone]

## 2023-02-08 NOTE — Telephone Encounter (Signed)
I spoke with [name] at [facility]. She advised that she had spoken with the QP (Child psychotherapist) there and they reached out to the pt's guardian. The guardian had questions and had said the would call our office but I do not see any record of call. [name] is going to reach the guardian directly this AM and call us back.

## 2023-02-08 NOTE — Telephone Encounter (Signed)
Spoke to [name] at [facility]  at pts group home Pt dad is refusing for pt to start onfi . Dad states he needs more information why new medication was added . Made [name] aware toi speak to dad today and explained he did express understanding . Will make Megan,NP aware so she can call dad [name] states thank you for our help

## 2023-02-08 NOTE — Telephone Encounter (Signed)
Pt's father would like a call back to discuss why Onfi is being recommended.

## 2023-02-08 NOTE — Telephone Encounter (Signed)
Spoke to dad(checked DPR) Made dad aware the group home is reporting pt is still having jerking . Per Dr Terrace Arabia  will add  onfi 5mg  at bedtime Dad was questioning the reason why because that was not a seizure  I made dad aware the jerking is seizure like activity. Dad informed me he  was a physician and he was wondering why Dr Terrace Arabia didn't increase Vimpat .   Informed dad Dr Terrace Arabia had increased Vimpat in 07/2022  and when he came back for 6 month f/u group home states pt  was still having jerking . That's why Dr. Terrace Arabia added onfi 5mg  at bedtime . Made dad aware of next f/u . Dad states will call group home to see if they will schedule pt a sooner appointment to make sure medication  is working . Dad expressed understanding and thanked me for calling

## 2023-02-08 NOTE — Telephone Encounter (Signed)
I called [facility] again to try to reach Ms [name]. I requested to speak with her directly but she was not in the office yet.  I will try again around 9 AM.

## 2023-02-08 NOTE — Telephone Encounter (Signed)
I called the patient's dad.  Explained why Dr. Terrace Arabia and I want to start Onfi.  Explained that the group home is still reporting seizure-like episodes.  He also has an abnormal EEG that puts him at risk for recurrent seizure.  He voiced understanding.  He is amenable with proceeding with Onfi.

## 2023-02-09 NOTE — Addendum Note (Signed)
Addended by: Bertram Savin on: 02/09/2023 03:58 PM   Modules accepted: Orders

## 2023-02-10 MED ORDER — CLOBAZAM 10 MG PO TABS: 5.0000 mg | ORAL_TABLET | Freq: Every day | ORAL | 5 refills | Status: DC

## 2023-02-10 NOTE — Telephone Encounter (Signed)
Please call facility and review side effects of ONFI with the RN.   From Uptodate: The following adverse drug reactions and incidences are derived from product labeling unless otherwise specified. >10%: Central nervous system: Drowsiness (16% to 25%), lethargy (10% to 15%), drooling (13% to 14%), aggressive behavior (8% to 14%), irritability (11%) Respiratory: Upper respiratory tract infection (13% to 14%) Miscellaneous: Fever (10% to 17%) 1% to 10%: Central nervous system: Ataxia (10%), sedation (9%), insomnia (5% to 7%), psychomotor agitation (5%), fatigue (3% to 5%), dysarthria (2% to 5%) Gastrointestinal: Constipation (2% to 10%), vomiting (7% to 9%), decreased appetite (7%), increased appetite (2% to 5%), dysphagia (5%) Genitourinary: Urinary tract infection (2% to 5%) Respiratory: Cough (3% to 7%), pneumonia (3% to 7%), bronchitis (2% to 5%)   The FDA has issued a drug safety communication warning that clobazam (Onfi, Sympazan) can cause Drug Reaction with Eosinophilia and Systemic Symptoms (DRESS), a rare but serious hypersensitivity reaction that can be life-threatening if not diagnosed and treated quickly. The onset of DRESS typically occurs 2 to 8 weeks after starting clobazam; symptoms may include rash, fever, or swollen lymph nodes but can quickly progress, resulting in injury to organs including the liver, kidney, lungs, heart, or pancreas, the need for hospitalization, and death. The FDA is requiring warnings about this risk to be added to the prescribing information and Medication Guide. Health care providers should advise patients of the signs and symptoms of DRESS and to stop taking their medication and seek immediate medical attention if DRESS is suspected during treatment with clobazam.

## 2023-02-10 NOTE — Telephone Encounter (Addendum)
I called [name] with [facility] [phone]).  She requested for the side effect information below to be faxed over to her at [phone] so they can file it. Her email is also [name]. Even though [place] NP spoke with pt's father who agreed to start Onfi, [name] said when she is back in the office there she will confirm approval with the legal guardian based on their guardian sheet before starting Onfi because the mother might be the guardian.  Side effects (below) faxed to [facility] @ [facility]. Received a receipt of confirmation.

## 2023-02-10 NOTE — Addendum Note (Signed)
Addended by: Enedina Finner on: 02/10/2023 07:17 AM   Modules accepted: Orders

## 2023-02-16 NOTE — Telephone Encounter (Signed)
[name] w/ [facility] called back to update that she spoke with the patient's mother, [name]. Mother says they don't want patient to start Onfi right now.  Patient has an appointment with pulmonology on October 3rd. Pt's mother is wondering if some of his twitching could be from something else. Mother reports he is up a lot during the night and sleeps through the day. The patient used to be on a cpap machine. After he sees pulmonology, has sleep study, etc. They will meet with the [facility] team and discuss plan of care/review side effects of Onfi. [name] will provide [place] with update as to whether or not the family proceeds with Onfi.

## 2023-03-02 NOTE — Progress Notes (Deleted)
HPI male Down's Syndrome, followed for OSA, complicated by seizure disorder, GERD, HBP, hypothyroid NPSG 07/15/06- AHI 55.7/ hr, desaturation to 94%, body weight 187 lbs, bruxism . Report is in Epic Notes.10/13/10   ------------------------------------------------------------------------------------------------------------------------------   07/10/2018- 57 year old male Down's Syndrome, followed for OSA, complicated by seizure disorder, GERD, HBP, hypothyroid, bradycardia Order was sent from 03/08/2018 to [facility] to replace old CPAP machine and change to auto 5-15.  -----OSA f/u Father reports they presented for scheduled sleep study but Wallice could not tolerate having the monitor leads attached.  The attempted test was aborted about 10 PM.  He has not been using CPAP.  The attendant from his group home reports "not much snoring".  Vittorio had lost about 25 pounds when he was changed off of Depakote.  He sleeps in a recliner.  He sleeps more in the daytime that at night but that is not causing any problems.  03/03/23- 57 year old male Down's Syndrome, followed for OSA, complicated by seizure disorder, GERD, HBP, hypothyroid, bradycardia Order was sent from 03/08/2018 to [facility] to replace old CPAP machine and change to auto 5-15. Covid infection late July.  CXR 01/11/23 1V- IMPRESSION: Underinflation with mild hazy opacity in the left lung base. Subtle infiltrates not excluded. Recommend follow-up. Kyphotic x-ray obscures the apices. ROS-see HPI- as reported by staff assistant with him  + = positive Constitutional:   No-   weight loss, night sweats, fevers, chills, +fatigue, lassitude. HEENT:   No-  headaches, difficulty swallowing, tooth/dental problems, sore throat,       No-  sneezing, itching, ear ache, nasal congestion, post nasal drip,  CV:  No-   chest pain, orthopnea, PND, swelling in lower extremities, anasarca, dizziness, palpitations Resp: No-   shortness of breath with exertion or at  rest.              No-   productive cough,  No non-productive cough,  No- coughing up of blood.              No-   change in color of mucus.  No- wheezing.   Skin: No-   rash or lesions. GI:  No-   heartburn, indigestion, abdominal pain, nausea, vomiting,  GU: No-   . MS:  No-   joint pain or swelling.  Neuro-     HPI Psych:  No- change in mood or affect. No depression or anxiety.  No memory loss.  OBJ- Physical Exam General- + very drowsy,  Affect-passive, Distress- none acute,+ non-verbal  Skin-  No breakdown or strap pressure marks. Lymphadenopathy- none Head- atraumatic            Eyes- Gross vision intact, PERRLA, conjunctivae and secretions clear            Ears- Hearing, grossly normal            Nose- Clear, no-Septal dev, mucus, polyps, erosion, perforation             Throat- Mallampati III-IV , mucosa clear , drainage- none, tonsils- atrophic Neck- flexible , trachea midline, no stridor , thyroid nl, carotid no bruit Chest - symmetrical excursion , unlabored           Heart/CV- RRR , no murmur , no gallop  , no rub, nl s1 s2                           - JVD- none , edema- none, stasis changes- none, varices-  none           Lung- clear to P&A, wheeze- none, cough- none , dullness-none, rub- none           Chest wall-  Abd-  Br/ Gen/ Rectal- Not done, not indicated Extrem-  Neuro- +severely retarded, +nystagmus,  Does not seem to comprehend. But more alert and attentive than previous visits.

## 2023-03-03 ENCOUNTER — Other Ambulatory Visit: Payer: Self-pay

## 2023-03-03 ENCOUNTER — Emergency Department (HOSPITAL_COMMUNITY): Payer: Medicare Other

## 2023-03-03 ENCOUNTER — Emergency Department (HOSPITAL_COMMUNITY)
Admission: EM | Admit: 2023-03-03 | Discharge: 2023-03-03 | Disposition: A | Discharge status: Home / Self Care | Payer: Medicare Other | Attending: Emergency Medicine | Admitting: Emergency Medicine

## 2023-03-03 ENCOUNTER — Institutional Professional Consult (permissible substitution): Payer: Medicare Other | Admitting: Internal Medicine

## 2023-03-03 DIAGNOSIS — R4182 Altered mental status, unspecified: Secondary | ICD-10-CM | POA: Diagnosis not present

## 2023-03-03 DIAGNOSIS — Q909 Down syndrome, unspecified: Secondary | ICD-10-CM | POA: Insufficient documentation

## 2023-03-03 DIAGNOSIS — R4189 Other symptoms and signs involving cognitive functions and awareness: Secondary | ICD-10-CM

## 2023-03-03 DIAGNOSIS — R600 Localized edema: Secondary | ICD-10-CM | POA: Diagnosis not present

## 2023-03-03 DIAGNOSIS — F0390 Unspecified dementia without behavioral disturbance: Secondary | ICD-10-CM | POA: Insufficient documentation

## 2023-03-03 DIAGNOSIS — R627 Adult failure to thrive: Secondary | ICD-10-CM | POA: Diagnosis not present

## 2023-03-03 DIAGNOSIS — R5383 Other fatigue: Secondary | ICD-10-CM | POA: Insufficient documentation

## 2023-03-03 DIAGNOSIS — R569 Unspecified convulsions: Secondary | ICD-10-CM | POA: Diagnosis not present

## 2023-03-03 DIAGNOSIS — R630 Anorexia: Secondary | ICD-10-CM | POA: Diagnosis not present

## 2023-03-03 DIAGNOSIS — Z8659 Personal history of other mental and behavioral disorders: Secondary | ICD-10-CM

## 2023-03-03 DIAGNOSIS — R001 Bradycardia, unspecified: Secondary | ICD-10-CM | POA: Insufficient documentation

## 2023-03-03 DIAGNOSIS — R638 Other symptoms and signs concerning food and fluid intake: Secondary | ICD-10-CM

## 2023-03-03 DIAGNOSIS — E039 Hypothyroidism, unspecified: Secondary | ICD-10-CM | POA: Diagnosis not present

## 2023-03-03 DIAGNOSIS — R464 Slowness and poor responsiveness: Secondary | ICD-10-CM | POA: Diagnosis present

## 2023-03-03 LAB — CBC WITH DIFFERENTIAL/PLATELET
Abs Immature Granulocytes: 0.01 10*3/uL (ref 0.00–0.07)
Basophils Absolute: 0 10*3/uL (ref 0.0–0.1)
Basophils Relative: 1 %
Eosinophils Absolute: 0 10*3/uL (ref 0.0–0.5)
Eosinophils Relative: 1 %
HCT: 39.7 % (ref 39.0–52.0)
Hemoglobin: 13.5 g/dL (ref 13.0–17.0)
Immature Granulocytes: 0 %
Lymphocytes Relative: 40 %
Lymphs Abs: 1.7 10*3/uL (ref 0.7–4.0)
MCH: 34 pg (ref 26.0–34.0)
MCHC: 34 g/dL (ref 30.0–36.0)
MCV: 100 fL (ref 80.0–100.0)
Monocytes Absolute: 0.4 10*3/uL (ref 0.1–1.0)
Monocytes Relative: 10 %
Neutro Abs: 2 10*3/uL (ref 1.7–7.7)
Neutrophils Relative %: 48 %
Platelets: 242 10*3/uL (ref 150–400)
RBC: 3.97 MIL/uL — ABNORMAL LOW (ref 4.22–5.81)
RDW: 13.2 % (ref 11.5–15.5)
WBC: 4.3 10*3/uL (ref 4.0–10.5)
nRBC: 0 % (ref 0.0–0.2)

## 2023-03-03 LAB — COMPREHENSIVE METABOLIC PANEL
ALT: 35 U/L (ref 0–44)
AST: 29 U/L (ref 15–41)
Albumin: 2.8 g/dL — ABNORMAL LOW (ref 3.5–5.0)
Alkaline Phosphatase: 99 U/L (ref 38–126)
Anion gap: 9 (ref 5–15)
BUN: 14 mg/dL (ref 6–20)
CO2: 25 mmol/L (ref 22–32)
Calcium: 9 mg/dL (ref 8.9–10.3)
Chloride: 104 mmol/L (ref 98–111)
Creatinine, Ser: 0.69 mg/dL (ref 0.61–1.24)
GFR, Estimated: >60 mL/min (ref >60)
Glucose, Bld: 94 mg/dL (ref 70–99)
Potassium: 4.4 mmol/L (ref 3.5–5.1)
Sodium: 138 mmol/L (ref 135–145)
Total Bilirubin: 0.5 mg/dL (ref 0.3–1.2)
Total Protein: 6.9 g/dL (ref 6.5–8.1)

## 2023-03-03 LAB — TSH: TSH: 0.77 u[IU]/mL (ref 0.350–4.500)

## 2023-03-03 LAB — URINALYSIS, ROUTINE W REFLEX MICROSCOPIC
Bilirubin Urine: NEGATIVE
Glucose, UA: NEGATIVE mg/dL
Hgb urine dipstick: NEGATIVE
Ketones, ur: NEGATIVE mg/dL
Leukocytes,Ua: NEGATIVE
Nitrite: NEGATIVE
Protein, ur: NEGATIVE mg/dL
Specific Gravity, Urine: 1.008 (ref 1.005–1.030)
pH: 6 (ref 5.0–8.0)

## 2023-03-03 LAB — RAPID URINE DRUG SCREEN, HOSP PERFORMED
Amphetamines: NOT DETECTED
Barbiturates: NOT DETECTED
Benzodiazepines: POSITIVE — AB
Cocaine: NOT DETECTED
Opiates: NOT DETECTED
Tetrahydrocannabinol: NOT DETECTED

## 2023-03-03 LAB — T4, FREE: Free T4: 1.36 ng/dL — ABNORMAL HIGH (ref 0.61–1.12)

## 2023-03-03 LAB — BLOOD GAS, VENOUS
Acid-base deficit: 3.8 mmol/L — ABNORMAL HIGH (ref 0.0–2.0)
Bicarbonate: 21.5 mmol/L (ref 20.0–28.0)
Drawn by: 6376
O2 Saturation: 40 %
Patient temperature: 36.4
pCO2, Ven: 38 mm[Hg] — ABNORMAL LOW (ref 44–60)
pH, Ven: 7.36 (ref 7.25–7.43)
pO2, Ven: <31 mm[Hg] — CL (ref 32–45)

## 2023-03-03 LAB — I-STAT VENOUS BLOOD GAS, ED
Acid-Base Excess: 1 mmol/L (ref 0.0–2.0)
Bicarbonate: 25.9 mmol/L (ref 20.0–28.0)
Calcium, Ion: 1.12 mmol/L — ABNORMAL LOW (ref 1.15–1.40)
HCT: 41 % (ref 39.0–52.0)
Hemoglobin: 13.9 g/dL (ref 13.0–17.0)
O2 Saturation: 92 %
Potassium: 4.3 mmol/L (ref 3.5–5.1)
Sodium: 137 mmol/L (ref 135–145)
TCO2: 27 mmol/L (ref 22–32)
pCO2, Ven: 43.4 mm[Hg] — ABNORMAL LOW (ref 44–60)
pH, Ven: 7.384 (ref 7.25–7.43)
pO2, Ven: 65 mm[Hg] — ABNORMAL HIGH (ref 32–45)

## 2023-03-03 LAB — CBG MONITORING, ED: Glucose-Capillary: 92 mg/dL (ref 70–99)

## 2023-03-03 LAB — MAGNESIUM: Magnesium: 1.6 mg/dL — ABNORMAL LOW (ref 1.7–2.4)

## 2023-03-03 LAB — ETHANOL: Alcohol, Ethyl (B): <10 mg/dL (ref <10)

## 2023-03-03 MED ORDER — MAGNESIUM SULFATE 2 GM/50ML IV SOLN
2.0000 g | Freq: Once | INTRAVENOUS | Status: AC
  Administered 2023-03-03: 2 g via INTRAVENOUS
  Filled 2023-03-03: qty 50

## 2023-03-03 MED ORDER — LACTATED RINGERS IV BOLUS
500.0000 mL | Freq: Once | INTRAVENOUS | Status: AC
  Administered 2023-03-03: 500 mL via INTRAVENOUS

## 2023-03-03 NOTE — ED Notes (Signed)
Dad at bedside along with group home coordinators.

## 2023-03-03 NOTE — ED Provider Notes (Signed)
[place] EMERGENCY DEPARTMENT AT [facility] Provider Note   CSN: [id number] Arrival date & time: 03/03/23  1007     History  Chief Complaint  Patient presents with   Bradycardia    [name] is a 57 y.o. male.  57 year old male with a history of seizures on Keppra and Vimpat, Down syndrome, hypothyroid, and dementia who presents with decreased responsiveness.  Limited history available since patient is nonverbal at baseline.  Per EMS was less responsive than usual today and so they were called.  On their arrival was bradycardic.  No additional history available.  Did attempt to call facility multiple times without success.        Home Medications Prior to Admission medications   Medication Sig Start Date End Date Taking? Authorizing Provider  acetaminophen (TYLENOL) 160 MG/5ML solution Take 20 mLs by mouth every 4 (four) hours as needed.    [provider]  acetaminophen (TYLENOL) 650 MG CR tablet Take 650 mg by mouth 3 (three) times daily.    [provider]  aluminum hydroxide-magnesium carbonate (ACID GONE) 95-358 MG/15ML SUSP Take by mouth.    [provider]  Artificial Tear Ointment (LACRI-LUBE OP) Place 1 application into both eyes at bedtime.    [provider]  atorvastatin (LIPITOR) 10 MG tablet Take 10 mg by mouth daily.    [provider]  benazepril (LOTENSIN) 10 MG tablet Take 10 mg by mouth at bedtime.    [provider]  bisacodyl (DULCOLAX) 10 MG suppository Place 10 mg rectally as needed for moderate constipation.    [provider]  brompheniramine-pseudoephedrine-dextromethorphan (DIMETAPP DM) 15-1-5 MG/5ML ELIX Take by mouth every 6 (six) hours as needed.    [provider]  calcitonin, salmon, (MIACALCIN/FORTICAL) 200 UNIT/ACT nasal spray Place 1 spray into alternate nostrils daily.    [provider]  Cholecalciferol (VITAMIN D3) 50 MCG (2000 UT) TABS Take  2,000 Units by mouth every morning.    [provider]  ciclopirox (PENLAC) 8 % solution Apply 1 application topically See admin instructions. Apply topically to toenails every Wednesday evening 01/08/21   [provider]  cloBAZam (ONFI) 10 MG tablet Take 0.5 tablets (5 mg total) by mouth at bedtime. 02/10/23   [name], NP  Cold Sore Products (BLISTEX EX) Apply topically.    [provider]  Diethyltoluamide (OFF ACTIVE EX) Apply topically.    [provider]  diphenhydrAMINE (BENADRYL) 25 MG tablet Take 25 mg by mouth as needed.    [provider]  Docosanol 10 % CREA Apply topically.    [provider]  doxycycline (VIBRAMYCIN) 50 MG capsule Take 50 mg by mouth daily.    [provider]  guaiFENesin (ROBITUSSIN) 100 MG/5ML liquid Take 5 mLs by mouth every 4 (four) hours as needed for cough or to loosen phlegm.    [provider]  Homeopathic Products (MURINE EAR WAX RELIEF OT) Place in ear(s).    [provider]  hydrocortisone cream 1 % Apply 1 application topically 2 (two) times daily.    [provider]  Infant Care Products (JOHNSONS BABY SHAMPOO) SHAM Apply 1 application topically See admin instructions. Mix 1/2 water and 1/2 shampoo - use to scrub eyebrows and eyelids every morning    [provider]  lacosamide (VIMPAT) 200 MG TABS tablet Take 1 tablet (200 mg total) by mouth 2 (two) times daily. 07/01/22   Levert Feinstein, MD  lactulose (CHRONULAC) 10  GM/15ML solution Take 10 g by mouth every morning.    [provider]  levothyroxine (SYNTHROID) 88 MCG tablet Take 88 mcg by mouth every morning. 01/16/21   [provider]  loperamide (IMODIUM A-D) 2 MG tablet Take 4 mg by mouth as needed for diarrhea or loose stools.    [provider]  magnesium hydroxide (MILK OF MAGNESIA) 400 MG/5ML suspension Take by mouth daily as needed for mild constipation.    [provider]  Omega-3 Fatty Acids (FISH OIL) 500 MG CAPS Take 1,500 mg by mouth every morning.    [provider]  promethazine (PHENERGAN) 25 MG tablet Take 25 mg by mouth as needed for nausea or vomiting.    [provider]  pyridOXINE (VITAMIN B-6) 100 MG tablet Take 100 mg by mouth every morning.    [provider]  rivastigmine (EXELON) 4.6 mg/24hr Place 4.6 mg onto the skin daily.    [provider]  Soap & Cleansers (PHISODERM EX) Apply 1 application topically 2 (two) times daily. Apply to face    [provider]  Sunscreens (COPPERTONE DEFEND & CARE EX) Apply topically.    [provider]  Tetrahydrozoline HCl (VISINE OP) Place 1 drop into both eyes 3 (three) times daily.    [provider]  tolnaftate (TINACTIN) 1 % spray Apply 1 application topically See admin instructions. Spray topically between toes daily at bedtime    [provider]  Zinc Oxide (BALMEX EX) Apply topically.    [provider]      Allergies    Clonidine derivatives    Review of Systems   Review of Systems  Physical Exam Updated Vital Signs BP (!) 155/86   Pulse (!) 55   Temp (!) 97.3 F (36.3 C)   Resp 14   SpO2 100%  Physical Exam Vitals and nursing note reviewed.  Constitutional:      Appearance: He is well-developed.     Comments: Drowsy   HENT:     Head: Normocephalic and atraumatic.     Right Ear: External ear normal.     Left Ear: External ear normal.     Nose: Nose normal.  Eyes:     Extraocular Movements: Extraocular movements intact.     Conjunctiva/sclera: Conjunctivae normal.     Pupils: Pupils are equal, round, and reactive to light.     Comments: Pupils 4mm BL  Cardiovascular:     Rate and Rhythm: Regular rhythm. Bradycardia present.     Heart sounds: Normal heart sounds.  Pulmonary:     Effort: Pulmonary effort is normal. No respiratory distress.     Breath sounds: Normal breath sounds.   Abdominal:     General: There is no distension.     Palpations: Abdomen is soft. There is no mass.     Tenderness: There is no abdominal tenderness. There is no guarding.  Musculoskeletal:     Right lower leg: Edema present.     Left lower leg: Edema present.  Skin:    General: Skin is warm and dry.  Neurological:     Comments: Nonverbal at baseline.  Patient contracted and leaning to his left which per EMS is his baseline.  Withdraws all 4 extremities to pain.     ED Results / Procedures / Treatments   Labs (all labs ordered are listed, but only abnormal results are displayed) Labs Reviewed  COMPREHENSIVE METABOLIC PANEL - Abnormal; Notable for the following components:  Result Value   Albumin 2.8 (*)    All other components within normal limits  BLOOD GAS, VENOUS - Abnormal; Notable for the following components:   pCO2, Ven 38 (*)    pO2, Ven <31 (*)    Acid-base deficit 3.8 (*)    All other components within normal limits  RAPID URINE DRUG SCREEN, HOSP PERFORMED - Abnormal; Notable for the following components:   Benzodiazepines POSITIVE (*)    All other components within normal limits  URINALYSIS, ROUTINE W REFLEX MICROSCOPIC - Abnormal; Notable for the following components:   Color, Urine STRAW (*)    All other components within normal limits  T4, FREE - Abnormal; Notable for the following components:   Free T4 1.36 (*)    All other components within normal limits  MAGNESIUM - Abnormal; Notable for the following components:   Magnesium 1.6 (*)    All other components within normal limits  CBC WITH DIFFERENTIAL/PLATELET - Abnormal; Notable for the following components:   RBC 3.97 (*)    All other components within normal limits  I-STAT VENOUS BLOOD GAS, ED - Abnormal; Notable for the following components:   pCO2, Ven 43.4 (*)    pO2, Ven 65 (*)    Calcium, Ion 1.12 (*)    All other components within normal limits  ETHANOL  TSH  CBC WITH DIFFERENTIAL/PLATELET   CBG MONITORING, ED  CBG MONITORING, ED  CBG MONITORING, ED    EKG None  Radiology CT HEAD WO CONTRAST  Result Date: 03/03/2023 CLINICAL DATA:  Altered mental status. EXAM: CT HEAD WITHOUT CONTRAST TECHNIQUE: Contiguous axial images were obtained from the base of the skull through the vertex without intravenous contrast. RADIATION DOSE REDUCTION: This exam was performed according to the departmental dose-optimization program which includes automated exposure control, adjustment of the mA and/or kV according to patient size and/or use of iterative reconstruction technique. COMPARISON:  CT scan head from 01/11/2023. FINDINGS: Examination is limited due to artifacts. Brain: No evidence of acute infarction, hemorrhage, hydrocephalus, extra-axial collection or mass lesion/mass effect. Redemonstration of cerebral volume loss/atrophy. Vascular: No hyperdense vessel or unexpected calcification. Skull: Normal. Negative for fracture or focal lesion. Sinuses/Orbits: No acute finding. Other: Stable calcification in the right nasopharynx. IMPRESSION: *No acute intracranial abnormality. Electronically Signed   By: Jules Schick M.D.   On: 03/03/2023 16:22   DG Chest Portable 1 View  Result Date: 03/03/2023 CLINICAL DATA:  fatigue, ftt EXAM: PORTABLE CHEST 1 VIEW COMPARISON:  01/11/2023. FINDINGS: Mild prominence of interstitial markings, similar to the prior study. No acute consolidation or lung collapse. Bilateral lung fields are clear. Bilateral costophrenic angles are clear. Normal cardio-mediastinal silhouette. No acute osseous abnormalities. The soft tissues are within normal limits. IMPRESSION: No acute cardiopulmonary process. Electronically Signed   By: Jules Schick M.D.   On: 03/03/2023 16:18    Procedures Procedures    Medications Ordered in ED Medications  lactated ringers bolus 500 mL (0 mLs Intravenous Stopped 03/03/23 1257)  magnesium sulfate IVPB 2 g 50 mL (0 g Intravenous Stopped 03/03/23  1512)    ED Course/ Medical Decision Making/ A&P                                 Medical Decision Making Amount and/or Complexity of Data Reviewed Labs: ordered. Radiology: ordered.  Risk Prescription drug management.   [name] is a 57 y.o. male with comorbidities that  complicate the patient evaluation including  Keppra and Vimpat, Down syndrome, hypothyroid, and dementia who presents with decreased responsiveness.     Initial Ddx:  Stroke, ICH, medication side effect, hypoglycemia, hypothyroidism, dehydration, UTI, pneumonia, hypercapnia  MDM/Course:  Patient presented to the emergency department with decreased responsiveness.  Initially had limited available history but his father and a worker from his SNF did eventually come to the bedside.  Appears that he has had decreased appetite for the past several days.  Today appeared less responsive than usual so he decided to bring him into the emergency department for evaluation.  Because this initial history was not available his workup was very broad.  It did not show any electrolyte abnormalities or hypercapnic respiratory failure or signs of severe hypothyroidism that would be causing something like myxedema coma.  Did have some benzodiazepines in his urine drug screen which could potentially be related to his decreased responsiveness.  Upon re-evaluation the patient's mental status improved and family at the bedside reports that he is back to his baseline.  Signed out to the oncoming physician (Dr. Elayne Snare) awaiting head CT and chest x-ray reports.  This patient presents to the ED for concern of complaints listed in HPI, this involves an extensive number of treatment options, and is a complaint that carries with it a high risk of complications and morbidity. Disposition including potential need for admission considered.   Dispo: DC Home. Return precautions discussed including, but not limited to, those listed in the AVS.  Allowed pt time to ask questions which were answered fully prior to dc.  Additional history obtained from father Records reviewed Outpatient Clinic Notes The following labs were independently interpreted: Chemistry and show no acute abnormality I independently reviewed the following imaging with scope of interpretation limited to determining acute life threatening conditions related to emergency care: CT Head and do not see any acute abnormality I personally reviewed and interpreted cardiac monitoring: sinus bradycardia I personally reviewed and interpreted the pt's EKG: see above for interpretation  I have reviewed the patients home medications and made adjustments as needed Social Determinants of health:  Nonverbal, SNF resident  Portions of this note were generated with Scientist, clinical (histocompatibility and immunogenetics). Dictation errors may occur despite best attempts at proofreading.           Final Clinical Impression(s) / ED Diagnoses Final diagnoses:  Decreased responsiveness  Decreased oral intake  History of dementia    Rx / DC Orders ED Discharge Orders     None         Rondel Baton, MD 03/03/23 1909

## 2023-03-03 NOTE — ED Provider Notes (Signed)
  Physical Exam  BP 135/75   Pulse (!) 46   Temp (!) 97.3 F (36.3 C)   Resp 10   SpO2 96%   Physical Exam Vitals and nursing note reviewed.  HENT:     Head: Normocephalic and atraumatic.  Cardiovascular:     Rate and Rhythm: Regular rhythm. Bradycardia present.  Pulmonary:     Effort: Pulmonary effort is normal.  Abdominal:     General: There is no distension.  Neurological:     Mental Status: He is alert.  Psychiatric:        Mood and Affect: Mood normal.     Procedures  Procedures  ED Course / MDM    Medical Decision Making I, Estelle June DO, have assumed care of this patient from the previous provider pending CT head Noncon, chest x-ray, reassessment and disposition  CT head shows no acute intracranial findings Chest x-ray no acute disease Reassessed patient He was sleeping comfortably and was able to drink while he is here.  Caregiver and father at bedside had voiced concern for decreased p.o. intake and stated he was not drinking at home but was able to drink 2 cups of water here.  He has not eaten a full meal yet.  We have repleted magnesium.  Remains bradycardic in the 50s but this is around his baseline per father.  No evidence of heart block or other dysrhythmia.  No acute findings on ED workup.  Stable for discharge with close PCP follow-up  Amount and/or Complexity of Data Reviewed Labs: ordered. Radiology: ordered.  Risk Prescription drug management.    Final diagnosis Decreased responsiveness Decreased oral intake        Royanne Foots, DO 03/03/23 1721

## 2023-03-03 NOTE — ED Triage Notes (Signed)
Pt BIB[facility] from [facility], for a delay in response but nonveral at baseline. Pt has decreased oral intake for the past couple days and not as interactive per staff with an increase in sleeping.   CBG 80 110/palp

## 2023-03-03 NOTE — Discharge Instructions (Signed)
You were seen for your decreased eating and decreased responsiveness in the emergency department.   At home, please continue to eat.    Check your MyChart online for the results of any tests that had not resulted by the time you left the emergency department.   Follow-up with your primary doctor in 2-3 days regarding your visit.    Return immediately to the emergency department if you experience any of the following: Seizures, persistent decreased responsiveness, or any other concerning symptoms.    Thank you for visiting our Emergency Department. It was a pleasure taking care of you today.

## 2023-03-03 NOTE — ED Notes (Signed)
Ct notified that Dr. Jarold Motto would like them to attempt to get Ct again

## 2023-03-07 ENCOUNTER — Emergency Department (HOSPITAL_COMMUNITY)
Admission: EM | Admit: 2023-03-07 | Discharge: 2023-03-07 | Disposition: A | Discharge status: Home / Self Care | Payer: Medicare Other | Attending: Emergency Medicine | Admitting: Emergency Medicine

## 2023-03-07 DIAGNOSIS — R4182 Altered mental status, unspecified: Secondary | ICD-10-CM

## 2023-03-07 DIAGNOSIS — T424X5A Adverse effect of benzodiazepines, initial encounter: Secondary | ICD-10-CM | POA: Diagnosis not present

## 2023-03-07 DIAGNOSIS — R001 Bradycardia, unspecified: Secondary | ICD-10-CM | POA: Insufficient documentation

## 2023-03-07 DIAGNOSIS — Q909 Down syndrome, unspecified: Secondary | ICD-10-CM | POA: Diagnosis not present

## 2023-03-07 DIAGNOSIS — T50905A Adverse effect of unspecified drugs, medicaments and biological substances, initial encounter: Secondary | ICD-10-CM

## 2023-03-07 LAB — CBG MONITORING, ED: Glucose-Capillary: 95 mg/dL (ref 70–99)

## 2023-03-07 NOTE — Discharge Instructions (Signed)
It was our pleasure to provide your ER care today - we hope that you feel better.  Encourage [name] to drink adequate fluids/stay well hydrated, and eat balanced diet.   Minimize use of sedating medications.   Follow up closely with primary care doctor in the coming week.  Return to ER if worse, new symptoms, fevers, trouble breathing, new or severe pain, or other concern.

## 2023-03-07 NOTE — ED Triage Notes (Signed)
Pt BIB [facility] from a [facility]. Pt went to the dentist and was sedated with 2 mg PO of ativan at 1200. Pt came back and the home reports he was more altered than baseline. EMS unclear of what the differences are. Group Home reports low O2 saturations. Pt has a hx of dementia and is nonverbal at baseline. Pt's HR bradyed down to the 40s with EMS. EMS reports extremities were cold to touch and mottled. Capillary refill was more than 3 seconds with EMS.   EMS VS BP 105/34 R 14 O2 100% 500 NS CBG 109 20 LAC

## 2023-03-07 NOTE — ED Notes (Signed)
Pt not able to eat or drink

## 2023-03-07 NOTE — ED Provider Notes (Signed)
[place] EMERGENCY DEPARTMENT AT [facility] Provider Note   CSN: [id number] Arrival date & time: 03/07/23  1659     History  Chief Complaint  Patient presents with   Altered Mental Status   Bradycardia    [name] is a 57 y.o. male.  Pt with hx Down's, sent from [facility]CF with general weakness, drowsiness. Per report, pt had been given ativan 2 mg earlier this AM for dental procedure. Pt limited historian, not verbally responsive at baseline, level 5 caveat.  No report of trauma/fall. No report of fevers. CBG 109.  EMS notes hr in 40's-50's with report that is not unusual for patient.   The history is provided by the patient, the EMS personnel and medical records. The history is limited by the condition of the patient.  Altered Mental Status      Home Medications Prior to Admission medications   Medication Sig Start Date End Date Taking? Authorizing Provider  acetaminophen (TYLENOL) 650 MG CR tablet Take 650 mg by mouth 3 (three) times daily.   Yes [provider]  aluminum hydroxide-magnesium carbonate (ACID GONE) 95-358 MG/15ML SUSP Take 15 mLs by mouth in the morning.   Yes [provider]  Artificial Tear Ointment (LACRI-LUBE OP) Place 1 application into both eyes at bedtime.   Yes [provider]  atorvastatin (LIPITOR) 10 MG tablet Take 10 mg by mouth at bedtime.   Yes [provider]  benazepril (LOTENSIN) 10 MG tablet Take 10 mg by mouth at bedtime.   Yes [provider]  bisacodyl (DULCOLAX) 10 MG suppository Place 10 mg rectally as needed for moderate constipation.   Yes [provider]  brompheniramine-pseudoephedrine-dextromethorphan (DIMETAPP DM) 15-1-5 MG/5ML ELIX Take by mouth every 6 (six) hours as needed.   Yes [provider]  calcitonin, salmon, (MIACALCIN/FORTICAL) 200 UNIT/ACT nasal spray Place 1 spray into alternate nostrils daily.   Yes [provider]   Cholecalciferol (VITAMIN D3) 50 MCG (2000 UT) TABS Take 2,000 Units by mouth every morning.   Yes [provider]  ciclopirox (PENLAC) 8 % solution Apply 1 application topically See admin instructions. Apply topically to toenails every Wednesday evening 01/08/21  Yes [provider]  cloBAZam (ONFI) 10 MG tablet Take 0.5 tablets (5 mg total) by mouth at bedtime. Patient taking differently: Take 5 mg by mouth daily. 02/10/23  Yes [name], NP  Diethyltoluamide (OFF ACTIVE EX) Apply topically.   Yes [provider]  diphenhydrAMINE (BENADRYL) 25 MG tablet Take 25 mg by mouth as needed.   Yes [provider]  doxycycline (VIBRAMYCIN) 50 MG capsule Take 50 mg by mouth daily.   Yes [provider]  guaiFENesin (ROBITUSSIN) 100 MG/5ML liquid Take 5 mLs by mouth every 4 (four) hours as needed for cough or to loosen phlegm.   Yes [provider]  Homeopathic Products (MURINE EAR WAX RELIEF OT) Place 1 drop into both ears daily.   Yes [provider]  hydrocortisone cream 1 % Apply 1 application topically 2 (two) times daily.   Yes [provider]  Infant Care Products (JOHNSONS BABY SHAMPOO) SHAM Apply 1 application  topically See admin instructions. Mix 1/2 water and 1/2 shampoo - use to scrub eyebrows and eyelids every morning   Yes [provider]  lacosamide (VIMPAT) 200 MG TABS tablet Take 1 tablet (200 mg total) by mouth 2 (two) times daily. 07/01/22  Yes Levert Feinstein, MD  lactulose (CHRONULAC) 10 GM/15ML solution  Take 10 g by mouth every morning.   Yes [provider]  levothyroxine (SYNTHROID) 88 MCG tablet Take 88 mcg by mouth every morning. 01/16/21  Yes [provider]  loperamide (IMODIUM A-D) 2 MG tablet Take 4 mg by mouth as needed for diarrhea or loose stools.   Yes [provider]  magnesium hydroxide (MILK OF MAGNESIA) 400 MG/5ML suspension Take by mouth daily as needed for mild  constipation.   Yes [provider]  Omega-3 Fatty Acids (FISH OIL) 500 MG CAPS Take 1,500 mg by mouth every morning.   Yes [provider]  promethazine (PHENERGAN) 25 MG tablet Take 25 mg by mouth as needed for nausea or vomiting.   Yes [provider]  pyridOXINE (VITAMIN B-6) 100 MG tablet Take 100 mg by mouth every morning.   Yes [provider]  rivastigmine (EXELON) 4.6 mg/24hr Place 4.6 mg onto the skin in the morning.   Yes [provider]  Soap & Cleansers (PHISODERM EX) Apply 1 application topically 2 (two) times daily. Apply to face   Yes [provider]  Sunscreens (COPPERTONE DEFEND & CARE EX) Apply topically.   Yes [provider]  Tetrahydrozoline HCl (VISINE OP) Place 1 drop into both eyes 3 (three) times daily.   Yes [provider]  tolnaftate (TINACTIN) 1 % spray Apply 1 application topically See admin instructions. Spray topically between toes daily at bedtime   Yes [provider]      Allergies    Clonidine derivatives    Review of Systems   Review of Systems  Unable to perform ROS: Patient nonverbal    Physical Exam Updated Vital Signs BP 114/72   Pulse (!) 57   Temp (!) 94.7 F (34.8 C) (Rectal)   Resp 16   SpO2 97%  Physical Exam Vitals and nursing note reviewed.  Constitutional:      Appearance: Normal appearance. He is well-developed.  HENT:     Head: Atraumatic.     Nose: Nose normal.     Mouth/Throat:     Mouth: Mucous membranes are moist.     Pharynx: Oropharynx is clear.  Eyes:     General: No scleral icterus.    Conjunctiva/sclera: Conjunctivae normal.     Pupils: Pupils are equal, round, and reactive to light.  Neck:     Trachea: No tracheal deviation.  Cardiovascular:     Rate and Rhythm: Regular rhythm. Bradycardia present.     Pulses: Normal pulses.     Heart sounds: Normal heart sounds. No murmur heard.    No friction rub. No gallop.  Pulmonary:      Effort: Pulmonary effort is normal. No accessory muscle usage or respiratory distress.     Breath sounds: Normal breath sounds. No stridor.  Abdominal:     General: Bowel sounds are normal. There is no distension.     Palpations: Abdomen is soft.     Tenderness: There is no abdominal tenderness.  Genitourinary:    Comments: No cva tenderness. Musculoskeletal:        General: No swelling or tenderness.     Cervical back: Neck supple. No rigidity.  Lymphadenopathy:     Cervical: No cervical adenopathy.  Skin:    General: Skin is warm and dry.     Findings: No rash.  Neurological:     Mental Status: He is alert.     Comments: Opens eyes. Moves extremities purposefully. Lethargic/slow to respond.  ED Results / Procedures / Treatments   Labs (all labs ordered are listed, but only abnormal results are displayed) Results for orders placed or performed during the hospital encounter of 03/07/23  POC CBG, ED  Result Value Ref Range   Glucose-Capillary 95 70 - 99 mg/dL   CT HEAD WO CONTRAST  Result Date: 03/03/2023 CLINICAL DATA:  Altered mental status. EXAM: CT HEAD WITHOUT CONTRAST TECHNIQUE: Contiguous axial images were obtained from the base of the skull through the vertex without intravenous contrast. RADIATION DOSE REDUCTION: This exam was performed according to the departmental dose-optimization program which includes automated exposure control, adjustment of the mA and/or kV according to patient size and/or use of iterative reconstruction technique. COMPARISON:  CT scan head from 01/11/2023. FINDINGS: Examination is limited due to artifacts. Brain: No evidence of acute infarction, hemorrhage, hydrocephalus, extra-axial collection or mass lesion/mass effect. Redemonstration of cerebral volume loss/atrophy. Vascular: No hyperdense vessel or unexpected calcification. Skull: Normal. Negative for fracture or focal lesion. Sinuses/Orbits: No acute finding. Other: Stable calcification in  the right nasopharynx. IMPRESSION: *No acute intracranial abnormality. Electronically Signed   By: Jules Schick M.D.   On: 03/03/2023 16:22   DG Chest Portable 1 View  Result Date: 03/03/2023 CLINICAL DATA:  fatigue, ftt EXAM: PORTABLE CHEST 1 VIEW COMPARISON:  01/11/2023. FINDINGS: Mild prominence of interstitial markings, similar to the prior study. No acute consolidation or lung collapse. Bilateral lung fields are clear. Bilateral costophrenic angles are clear. Normal cardio-mediastinal silhouette. No acute osseous abnormalities. The soft tissues are within normal limits. IMPRESSION: No acute cardiopulmonary process. Electronically Signed   By: Jules Schick M.D.   On: 03/03/2023 16:18   DG Foot Complete Left  Result Date: 02/18/2023 CLINICAL DATA:  Swelling.  Bullous lesion.  Evaluate for fracture. EXAM: LEFT FOOT - COMPLETE 3+ VIEW COMPARISON:  Left foot radiographs 05/20/2022 FINDINGS: Previously there was an acute nondisplaced fracture within the distal shaft of the fifth metatarsal with mild angulation. Interval healing of this fracture with unchanged mild lateral apex angulation and interval unilateral cortical thickening/fusion. No acute fracture is seen. There is moderate forefoot soft tissue swelling versus patient large body habitus. IMPRESSION: Interval healing of the fifth metatarsal shaft fracture with unchanged mild lateral apex angulation. No acute fracture is seen. Forefoot soft tissue swelling versus patient large body habitus/systemic edema. Electronically Signed   By: Neita Garnet M.D.   On: 02/18/2023 17:53     EKG EKG Interpretation Date/Time:  Monday March 07 2023 18:05:00 EDT Ventricular Rate:  47 PR Interval:  200 QRS Duration:  139 QT Interval:  500 QTC Calculation: 443 R Axis:   -28  Text Interpretation: Sinus bradycardia Right bundle branch block Confirmed by Cathren Laine ([id number]) on 03/07/2023 6:20:33 PM  Radiology No results  found.  Procedures Procedures    Medications Ordered in ED Medications - No data to display  ED Course/ Medical Decision Making/ A&P                                 Medical Decision Making Problems Addressed: Acute alteration in mental status: acute illness or injury with systemic symptoms that poses a threat to life or bodily functions Down's syndrome: chronic illness or injury with exacerbation, progression, or side effects of treatment that poses a threat to life or bodily functions Medication side effect, initial encounter: acute illness or injury with systemic symptoms Sinus bradycardia: chronic illness or injury  Amount and/or Complexity of Data Reviewed Independent Historian:     Details: EMS, facility careworker, parent, hx External Data Reviewed: notes. Labs: ordered. Decision-making details documented in ED Course. Radiology: independent interpretation performed. Decision-making details documented in ED Course.    Details: Recent imaging.  ECG/medicine tests: ordered and independent interpretation performed. Decision-making details documented in ED Course.  Risk Decision regarding hospitalization.   Iv ns. Continuous pulse ox and cardiac monitoring.   Differential diagnosis includes medication side effect, dehydration, encephalopathy, etc. Dispo decision including potential need for admission considered - will get labs and imaging and reassess.   Reviewed nursing notes and prior charts for additional history. External reports reviewed. Additional history from: EMS.   Cardiac monitor: sinus rhythm, rate 50.  Recent labs 4 days ago for presentation w similar symptoms, reviewed/interpreted by me -  chem normal then, wbc and hgb normal.   Cbg check today, normal.   Recent Xrays reviewed/interpreted by me - no pna then.  Recent CT reviewed/interpreted by me - no hem then.   Recheck, pt more fully awake and alert, appears content, and in no apparent pain or  discomfort. Breathing comfortably, o2 sats 98%. Bp normal. Hr 60.  Parent/mom/guardian called, discussed pt, hx/symptoms, recent lab and imaging workup, etc - mom agrees re plan, incl not repeating imaging and labs, and if taking po, return to facility and outpatient f/u.  Facility careworker indicates current mental status/functional ability appears c/w baseline.   Pt has eaten ice cream/fluids provided.    Rec close pcp f/u.  Return precautions provided.          Final Clinical Impression(s) / ED Diagnoses Final diagnoses:  None    Rx / DC Orders ED Discharge Orders     None         Cathren Laine, MD 03/07/23 2223

## 2023-03-10 ENCOUNTER — Telehealth: Payer: Self-pay | Admitting: *Deleted

## 2023-03-10 NOTE — Telephone Encounter (Signed)
Rozann Lesches, with [facility] calling about pt's family dones not want pt on this medication Onfi.  He stated taking 02-15-2023 5mg  po at bedtime.  She is questioning if any taper is needed to come off.  Please advise.

## 2023-03-11 NOTE — Telephone Encounter (Signed)
I spoke with Dr. Terrace Arabia and ok to stop the onfi. (No weaning is needed).  I relayed to [name] at [facility].  They will call l back as needed.

## 2023-05-02 ENCOUNTER — Emergency Department (HOSPITAL_COMMUNITY): Payer: Medicare Other

## 2023-05-02 ENCOUNTER — Inpatient Hospital Stay (HOSPITAL_COMMUNITY): Payer: Medicare Other

## 2023-05-02 ENCOUNTER — Encounter (HOSPITAL_COMMUNITY): Payer: Self-pay

## 2023-05-02 ENCOUNTER — Other Ambulatory Visit: Payer: Self-pay

## 2023-05-02 ENCOUNTER — Inpatient Hospital Stay (HOSPITAL_COMMUNITY)
Admission: EM | Admit: 2023-05-02 | Discharge: 2023-05-13 | DRG: 682 | Disposition: A | Discharge status: Hospice | Payer: Medicare Other | Attending: Internal Medicine | Admitting: Internal Medicine

## 2023-05-02 DIAGNOSIS — E860 Dehydration: Secondary | ICD-10-CM | POA: Diagnosis present

## 2023-05-02 DIAGNOSIS — Q909 Down syndrome, unspecified: Secondary | ICD-10-CM | POA: Diagnosis not present

## 2023-05-02 DIAGNOSIS — E785 Hyperlipidemia, unspecified: Secondary | ICD-10-CM | POA: Diagnosis present

## 2023-05-02 DIAGNOSIS — R296 Repeated falls: Secondary | ICD-10-CM | POA: Diagnosis present

## 2023-05-02 DIAGNOSIS — Z833 Family history of diabetes mellitus: Secondary | ICD-10-CM

## 2023-05-02 DIAGNOSIS — Z7189 Other specified counseling: Secondary | ICD-10-CM | POA: Diagnosis not present

## 2023-05-02 DIAGNOSIS — G40909 Epilepsy, unspecified, not intractable, without status epilepticus: Secondary | ICD-10-CM | POA: Diagnosis present

## 2023-05-02 DIAGNOSIS — R569 Unspecified convulsions: Secondary | ICD-10-CM | POA: Diagnosis not present

## 2023-05-02 DIAGNOSIS — E870 Hyperosmolality and hypernatremia: Secondary | ICD-10-CM | POA: Diagnosis present

## 2023-05-02 DIAGNOSIS — N179 Acute kidney failure, unspecified: Secondary | ICD-10-CM | POA: Diagnosis present

## 2023-05-02 DIAGNOSIS — I10 Essential (primary) hypertension: Secondary | ICD-10-CM | POA: Diagnosis present

## 2023-05-02 DIAGNOSIS — J690 Pneumonitis due to inhalation of food and vomit: Secondary | ICD-10-CM | POA: Diagnosis not present

## 2023-05-02 DIAGNOSIS — Z66 Do not resuscitate: Secondary | ICD-10-CM | POA: Diagnosis present

## 2023-05-02 DIAGNOSIS — Z79899 Other long term (current) drug therapy: Secondary | ICD-10-CM

## 2023-05-02 DIAGNOSIS — R638 Other symptoms and signs concerning food and fluid intake: Secondary | ICD-10-CM | POA: Diagnosis not present

## 2023-05-02 DIAGNOSIS — R627 Adult failure to thrive: Secondary | ICD-10-CM | POA: Diagnosis present

## 2023-05-02 DIAGNOSIS — I4510 Unspecified right bundle-branch block: Secondary | ICD-10-CM | POA: Diagnosis present

## 2023-05-02 DIAGNOSIS — Z79890 Hormone replacement therapy (postmenopausal): Secondary | ICD-10-CM | POA: Diagnosis not present

## 2023-05-02 DIAGNOSIS — E872 Acidosis, unspecified: Secondary | ICD-10-CM | POA: Diagnosis not present

## 2023-05-02 DIAGNOSIS — M6282 Rhabdomyolysis: Secondary | ICD-10-CM | POA: Diagnosis present

## 2023-05-02 DIAGNOSIS — Z515 Encounter for palliative care: Secondary | ICD-10-CM

## 2023-05-02 DIAGNOSIS — N390 Urinary tract infection, site not specified: Secondary | ICD-10-CM | POA: Diagnosis present

## 2023-05-02 DIAGNOSIS — G4733 Obstructive sleep apnea (adult) (pediatric): Secondary | ICD-10-CM | POA: Diagnosis present

## 2023-05-02 DIAGNOSIS — F0390 Unspecified dementia without behavioral disturbance: Secondary | ICD-10-CM | POA: Diagnosis present

## 2023-05-02 DIAGNOSIS — R55 Syncope and collapse: Secondary | ICD-10-CM | POA: Diagnosis present

## 2023-05-02 DIAGNOSIS — G9341 Metabolic encephalopathy: Secondary | ICD-10-CM | POA: Diagnosis present

## 2023-05-02 DIAGNOSIS — Z888 Allergy status to other drugs, medicaments and biological substances status: Secondary | ICD-10-CM

## 2023-05-02 DIAGNOSIS — I959 Hypotension, unspecified: Secondary | ICD-10-CM | POA: Diagnosis present

## 2023-05-02 DIAGNOSIS — E039 Hypothyroidism, unspecified: Secondary | ICD-10-CM | POA: Diagnosis present

## 2023-05-02 DIAGNOSIS — Z8249 Family history of ischemic heart disease and other diseases of the circulatory system: Secondary | ICD-10-CM | POA: Diagnosis not present

## 2023-05-02 DIAGNOSIS — Z820 Family history of epilepsy and other diseases of the nervous system: Secondary | ICD-10-CM

## 2023-05-02 DIAGNOSIS — K219 Gastro-esophageal reflux disease without esophagitis: Secondary | ICD-10-CM | POA: Diagnosis present

## 2023-05-02 LAB — TSH: TSH: 6.481 u[IU]/mL — ABNORMAL HIGH (ref 0.350–4.500)

## 2023-05-02 LAB — CBC WITH DIFFERENTIAL/PLATELET
Abs Immature Granulocytes: 0.1 10*3/uL — ABNORMAL HIGH (ref 0.00–0.07)
Basophils Absolute: 0 10*3/uL (ref 0.0–0.1)
Basophils Relative: 0 %
Eosinophils Absolute: 0 10*3/uL (ref 0.0–0.5)
Eosinophils Relative: 0 %
HCT: 38.3 % — ABNORMAL LOW (ref 39.0–52.0)
Hemoglobin: 12 g/dL — ABNORMAL LOW (ref 13.0–17.0)
Immature Granulocytes: 1 %
Lymphocytes Relative: 6 %
Lymphs Abs: 0.7 10*3/uL (ref 0.7–4.0)
MCH: 34 pg (ref 26.0–34.0)
MCHC: 31.3 g/dL (ref 30.0–36.0)
MCV: 108.5 fL — ABNORMAL HIGH (ref 80.0–100.0)
Monocytes Absolute: 0.6 10*3/uL (ref 0.1–1.0)
Monocytes Relative: 5 %
Neutro Abs: 10.3 10*3/uL — ABNORMAL HIGH (ref 1.7–7.7)
Neutrophils Relative %: 88 %
Platelets: 220 10*3/uL (ref 150–400)
RBC: 3.53 MIL/uL — ABNORMAL LOW (ref 4.22–5.81)
RDW: 13.4 % (ref 11.5–15.5)
WBC: 11.7 10*3/uL — ABNORMAL HIGH (ref 4.0–10.5)
nRBC: 0 % (ref 0.0–0.2)

## 2023-05-02 LAB — I-STAT CHEM 8, ED
BUN: 56 mg/dL — ABNORMAL HIGH (ref 6–20)
Calcium, Ion: 1.02 mmol/L — ABNORMAL LOW (ref 1.15–1.40)
Chloride: 115 mmol/L — ABNORMAL HIGH (ref 98–111)
Creatinine, Ser: 2.4 mg/dL — ABNORMAL HIGH (ref 0.61–1.24)
Glucose, Bld: 105 mg/dL — ABNORMAL HIGH (ref 70–99)
HCT: 36 % — ABNORMAL LOW (ref 39.0–52.0)
Hemoglobin: 12.2 g/dL — ABNORMAL LOW (ref 13.0–17.0)
Potassium: 4.3 mmol/L (ref 3.5–5.1)
Sodium: 147 mmol/L — ABNORMAL HIGH (ref 135–145)
TCO2: 19 mmol/L — ABNORMAL LOW (ref 22–32)

## 2023-05-02 LAB — URINALYSIS, W/ REFLEX TO CULTURE (INFECTION SUSPECTED)
Bilirubin Urine: NEGATIVE
Glucose, UA: NEGATIVE mg/dL
Ketones, ur: NEGATIVE mg/dL
Nitrite: POSITIVE — AB
Protein, ur: NEGATIVE mg/dL
Specific Gravity, Urine: 1.011 (ref 1.005–1.030)
pH: 6 (ref 5.0–8.0)

## 2023-05-02 LAB — COMPREHENSIVE METABOLIC PANEL
ALT: 110 U/L — ABNORMAL HIGH (ref 0–44)
AST: 152 U/L — ABNORMAL HIGH (ref 15–41)
Albumin: 1.9 g/dL — ABNORMAL LOW (ref 3.5–5.0)
Alkaline Phosphatase: 67 U/L (ref 38–126)
Anion gap: 11 (ref 5–15)
BUN: 60 mg/dL — ABNORMAL HIGH (ref 6–20)
CO2: 21 mmol/L — ABNORMAL LOW (ref 22–32)
Calcium: 8 mg/dL — ABNORMAL LOW (ref 8.9–10.3)
Chloride: 115 mmol/L — ABNORMAL HIGH (ref 98–111)
Creatinine, Ser: 2.45 mg/dL — ABNORMAL HIGH (ref 0.61–1.24)
GFR, Estimated: 30 mL/min — ABNORMAL LOW (ref >60)
Glucose, Bld: 105 mg/dL — ABNORMAL HIGH (ref 70–99)
Potassium: 4.4 mmol/L (ref 3.5–5.1)
Sodium: 147 mmol/L — ABNORMAL HIGH (ref 135–145)
Total Bilirubin: 0.7 mg/dL (ref <1.2)
Total Protein: 5.8 g/dL — ABNORMAL LOW (ref 6.5–8.1)

## 2023-05-02 LAB — RAPID URINE DRUG SCREEN, HOSP PERFORMED
Amphetamines: NOT DETECTED
Barbiturates: NOT DETECTED
Benzodiazepines: NOT DETECTED
Cocaine: NOT DETECTED
Opiates: NOT DETECTED
Tetrahydrocannabinol: NOT DETECTED

## 2023-05-02 LAB — CBG MONITORING, ED: Glucose-Capillary: 93 mg/dL (ref 70–99)

## 2023-05-02 LAB — I-STAT CG4 LACTIC ACID, ED
Lactic Acid, Venous: 4.5 mmol/L (ref 0.5–1.9)
Lactic Acid, Venous: 3 mmol/L (ref 0.5–1.9)

## 2023-05-02 LAB — TROPONIN I (HIGH SENSITIVITY): Troponin I (High Sensitivity): 9 ng/L (ref <18)

## 2023-05-02 MED ORDER — HEPARIN SODIUM (PORCINE) 5000 UNIT/ML IJ SOLN
5000.0000 [IU] | Freq: Three times a day (TID) | INTRAMUSCULAR | Status: DC
  Administered 2023-05-02 – 2023-05-12 (×30): 5000 [IU] via SUBCUTANEOUS
  Filled 2023-05-02 (×29): qty 1

## 2023-05-02 MED ORDER — ACETAMINOPHEN 650 MG RE SUPP: 650.0000 mg | Freq: Four times a day (QID) | RECTAL | Status: DC | PRN

## 2023-05-02 MED ORDER — VITAMIN B-6 100 MG PO TABS: 100.0000 mg | ORAL_TABLET | Freq: Every morning | ORAL | Status: DC

## 2023-05-02 MED ORDER — CLOBAZAM 5 MG PO HALF TABLET: 5.0000 mg | ORAL_TABLET | Freq: Every day | ORAL | Status: DC

## 2023-05-02 MED ORDER — SODIUM CHLORIDE 0.9 % IV BOLUS
1000.0000 mL | Freq: Once | INTRAVENOUS | Status: AC
  Administered 2023-05-02: 1000 mL via INTRAVENOUS

## 2023-05-02 MED ORDER — LEVOTHYROXINE SODIUM 100 MCG/5ML IV SOLN
66.0000 ug | Freq: Every day | INTRAVENOUS | Status: DC
  Administered 2023-05-03 – 2023-05-09 (×7): 66 ug via INTRAVENOUS
  Filled 2023-05-02 (×8): qty 5

## 2023-05-02 MED ORDER — LACOSAMIDE 200 MG/20ML IV SOLN
200.0000 mg | INTRAVENOUS | Status: DC
  Administered 2023-05-03 – 2023-05-08 (×6): 200 mg via INTRAVENOUS
  Filled 2023-05-02 (×7): qty 20

## 2023-05-02 MED ORDER — SODIUM CHLORIDE 0.9 % IV SOLN
100.0000 mg | INTRAVENOUS | Status: DC
  Administered 2023-05-03 – 2023-05-09 (×7): 100 mg via INTRAVENOUS
  Filled 2023-05-02 (×7): qty 10

## 2023-05-02 MED ORDER — BISACODYL 10 MG RE SUPP: 10.0000 mg | RECTAL | Status: DC | PRN

## 2023-05-02 MED ORDER — SODIUM CHLORIDE 0.9 % IV SOLN
500.0000 mg | INTRAVENOUS | Status: AC
  Administered 2023-05-02 – 2023-05-06 (×5): 500 mg via INTRAVENOUS
  Filled 2023-05-02 (×5): qty 5

## 2023-05-02 MED ORDER — DEXTROSE 5 % IV SOLN: INTRAVENOUS | Status: DC

## 2023-05-02 MED ORDER — HYDRALAZINE HCL 20 MG/ML IJ SOLN: 10.0000 mg | Freq: Four times a day (QID) | INTRAMUSCULAR | Status: DC | PRN

## 2023-05-02 MED ORDER — ONDANSETRON HCL 4 MG/2ML IJ SOLN: 4.0000 mg | Freq: Four times a day (QID) | INTRAMUSCULAR | Status: DC | PRN

## 2023-05-02 MED ORDER — SODIUM CHLORIDE 0.9 % IV SOLN
1.0000 g | INTRAVENOUS | Status: AC
  Administered 2023-05-02 – 2023-05-06 (×5): 1 g via INTRAVENOUS
  Filled 2023-05-02 (×5): qty 10

## 2023-05-02 MED ORDER — RIVASTIGMINE 4.6 MG/24HR TD PT24: 4.6000 mg | MEDICATED_PATCH | Freq: Every morning | TRANSDERMAL | Status: DC

## 2023-05-02 MED ORDER — ONDANSETRON HCL 4 MG PO TABS: 4.0000 mg | ORAL_TABLET | Freq: Four times a day (QID) | ORAL | Status: DC | PRN

## 2023-05-02 MED ORDER — LACOSAMIDE 50 MG PO TABS: 200.0000 mg | ORAL_TABLET | Freq: Two times a day (BID) | ORAL | Status: DC

## 2023-05-02 MED ORDER — LORAZEPAM 2 MG/ML IJ SOLN: 1.0000 mg | INTRAMUSCULAR | Status: DC | PRN

## 2023-05-02 MED ORDER — ACETAMINOPHEN 325 MG PO TABS
650.0000 mg | ORAL_TABLET | Freq: Four times a day (QID) | ORAL | Status: DC | PRN
  Filled 2023-05-02: qty 2

## 2023-05-02 MED ORDER — ATORVASTATIN CALCIUM 10 MG PO TABS
10.0000 mg | ORAL_TABLET | Freq: Every day | ORAL | Status: DC
  Administered 2023-05-03 – 2023-05-09 (×7): 10 mg via ORAL
  Filled 2023-05-02 (×7): qty 1

## 2023-05-02 MED ORDER — LEVOTHYROXINE SODIUM 88 MCG PO TABS: 88.0000 ug | ORAL_TABLET | Freq: Every morning | ORAL | Status: DC

## 2023-05-02 NOTE — ED Triage Notes (Signed)
Hx of Down syndrome and nonverbal. Lives in [facility]. Baseline awake and able to follow some commands. Before lunch around 1210, pt was sitting on wheelchair slumped forward and appeared lethargic. GCS of 8 and cool to touch, mottled. Unable to get manual bp with EMS. 250 cc NS bolus by EMS.   EMS VS:  HR 50s BP 50s systolic

## 2023-05-02 NOTE — Progress Notes (Signed)
MEDICATION RELATED CONSULT NOTE - INITIAL   Pharmacy Consult for IV lacosamide and levothyroxine dosing Indication: AMS, NPO  Allergies  Allergen Reactions   Clonidine Derivatives Other (See Comments)    Causes symptomatic bradycardia with syncope and hypotension    Patient Measurements: Height: 5' (152.4 cm) Weight: 74.8 kg (164 lb 14.5 oz) IBW/kg (Calculated) : 50  Vital Signs: Temp: 98.1 F (36.7 C) (12/02 1405) Temp Source: Oral (12/02 1405) BP: 111/63 (12/02 1530) Pulse Rate: 76 (12/02 1530) Intake/Output from previous day: No intake/output data recorded. Intake/Output from this shift: Total I/O In: 2000 [IV Piggyback:2000] Out: -   Labs: Recent Labs    05/02/23 1333 05/02/23 1412  WBC 11.7*  --   HGB 12.0* 12.2*  HCT 38.3* 36.0*  PLT 220  --   CREATININE 2.45* 2.40*  ALBUMIN 1.9*  --   PROT 5.8*  --   AST 152*  --   ALT 110*  --   ALKPHOS 67  --   BILITOT 0.7  --    Estimated Creatinine Clearance: 29.5 mL/min (A) (by C-G formula based on SCr of 2.4 mg/dL (H)).   Microbiology: No results found for this or any previous visit (from the past 720 hour(s)).  Medical History: Past Medical History:  Diagnosis Date   Down's syndrome    GERD (gastroesophageal reflux disease)    Hypertension    Hypothyroid    Lives in group home    [facility]-[phone]-Roxanne-nurse   Nystagmus    Obstructive sleep apnea (adult) (pediatric)    does use a cpap at night   Seizure disorder (HCC) 10/28/2016   Seizures (HCC)     Medications:  (Not in a hospital admission)   Assessment: 57 yo M with history of Down syndrome, nonverbal at baseline, recurrent falls, seizures, hypothyroidism and HTN presents to ED with AMS. Patient lives in a group home and per father's report, patient has been increasingly more drowsy and not eating and drinking as well. Per patient's nurse at facility, patient has been taking his medications and last took his medications  this morning prior to slumping over in his chair. Pharmacy was consulted to change lacosamide and levothyroxine from PO to IV dosing.   Recommended to hold starting levothyroxine for 7 days prior to switching to IV formulation given the long half life of levothyroxine. However, per discussion with Dr. Thedore Mins, he does not believe that patient would have been taking his meds and is concerned that missing his regular home medications may be the cause of his altered mental status and would like levothyroxine IV to be given today.    Goal of Therapy:  Provide dose equivalents of home medications to continue home seizure and thyroid therapy while NPO  Plan:  Start lacosamide 100mg  IV qAM and lacosamide 200mg  IV at bedtime Start levothyroxine IV daily today.  F/u patient's mental status and ability to take PO   Wilburn Cornelia, PharmD, BCPS Clinical Pharmacist 05/02/2023 8:21 PM   Please refer to [facility] for pharmacy phone number

## 2023-05-02 NOTE — ED Notes (Signed)
Attempted to get 2nd blood culture with no success. Will notify MD.

## 2023-05-02 NOTE — Progress Notes (Signed)
Tech attempted EEG, but Pt was unavailable in CT.

## 2023-05-02 NOTE — H&P (Addendum)
TRH H&P   Patient Demographics:    [name], is a 57 y.o. male  MRN: [id number]   DOB - 11-Dec-1965  Admit Date - 05/02/2023  Outpatient Primary MD for the patient is [name], [name]  Patient coming from: Group Home  Chief Complaint  Patient presents with   Altered Mental Status      HPI:   [name]  is a 57 y.o. male,  Down syndrome, apparently nonverbal at baseline, recurrent falls, seizures, hypothyroidism, hypertension who lives in a group home comes into the hospital with altered mental status.  Patient is brought in by his father, apparently patient at baseline is mildly drowsy and usually is alert 10 to 12 hours a day however for the last 2 to 3 weeks he has been increasingly more drowsy not eating or drinking well, finally they brought him to the ER today where he was diagnosed with mild leukocytosis, severe dehydration and AKI.  Possible early sepsis could not be ruled out although he is not febrile, chest x-ray and abdominal x-ray are stable. Patient himself is extremely somnolent and unable to provide any history.  Due to patient's father patient has had similar presentation several weeks ago after he was started on a new seizure medication called Onfi, this medication was held with improvement in his mental status however is present medication list again shows Onfi and father is not certain whether this medicine was given to the patient recently or not.  Patient is currently somnolent unable to provide review of systems.  He is being admitted for severe dehydration, AKI, acute metabolic encephalopathy on top of chronic baseline encephalopathy, possible early sepsis with unclear source.     Review of systems:    Unable to provide ROS.   With Past History of the following :    Past Medical History:  Diagnosis Date   Down's syndrome    GERD (gastroesophageal reflux disease)    Hypertension    Hypothyroid    Lives in group home    [facility]-[phone]-fax-[phone]-Roxanne-nurse   Nystagmus    Obstructive sleep apnea (adult) (pediatric)    does use a cpap at night   Seizure disorder (HCC) 10/28/2016   Seizures (HCC)       Past Surgical History:  Procedure Laterality Date   IRRIGATION AND DEBRIDEMENT  07/01/2014  ORIF HUMERUS FRACTURE Left 02/05/2013   Procedure: OPEN REDUCTION INTERNAL FIXATION (ORIF) MEDIAL HUMERUS CONDYLE FRACTURE;  Surgeon: Jodi Marble, MD;  Location: [facility];  Service: Orthopedics;  Laterality: Left;   Right Elbow surgery        Social History:     Social History   Tobacco Use   Smoking status: Never   Smokeless tobacco: Never  Substance Use Topics   Alcohol use: No         Family History :     Family History  Problem Relation Age of Onset   Dementia Maternal Grandmother    Diabetes Paternal Grandfather    Heart failure Paternal Grandfather    Hypertension Paternal Grandfather    Seizures Neg Hx        Home Medications:   Prior to Admission medications   Medication Sig Start Date End Date Taking? Authorizing Provider  acetaminophen (TYLENOL) 650 MG CR tablet Take 650 mg by mouth 3 (three) times daily.    [provider]  aluminum hydroxide-magnesium carbonate (ACID GONE) 95-358 MG/15ML SUSP Take 15 mLs by mouth in the morning.    [provider]  Artificial Tear Ointment (LACRI-LUBE OP) Place 1 application into both eyes at bedtime.    [provider]  atorvastatin (LIPITOR) 10 MG tablet Take 10 mg by mouth at bedtime.    [provider]  benazepril (LOTENSIN) 10 MG tablet Take 10 mg by mouth at bedtime.    [provider]  bisacodyl (DULCOLAX)  10 MG suppository Place 10 mg rectally as needed for moderate constipation.    [provider]  brompheniramine-pseudoephedrine-dextromethorphan (DIMETAPP DM) 15-1-5 MG/5ML ELIX Take by mouth every 6 (six) hours as needed.    [provider]  calcitonin, salmon, (MIACALCIN/FORTICAL) 200 UNIT/ACT nasal spray Place 1 spray into alternate nostrils daily.    [provider]  Cholecalciferol (VITAMIN D3) 50 MCG (2000 UT) TABS Take 2,000 Units by mouth every morning.    [provider]  ciclopirox (PENLAC) 8 % solution Apply 1 application topically See admin instructions. Apply topically to toenails every Wednesday evening 01/08/21   [provider]  cloBAZam (ONFI) 10 MG tablet Take 0.5 tablets (5 mg total) by mouth at bedtime. Patient taking differently: Take 5 mg by mouth daily. 02/10/23   [name], NP  Diethyltoluamide (OFF ACTIVE EX) Apply topically.    [provider]  diphenhydrAMINE (BENADRYL) 25 MG tablet Take 25 mg by mouth as needed.    [provider]  doxycycline (VIBRAMYCIN) 50 MG capsule Take 50 mg by mouth daily.    [provider]  guaiFENesin (ROBITUSSIN) 100 MG/5ML liquid Take 5 mLs by mouth every 4 (four) hours as needed for cough or to loosen phlegm.    [provider]  Homeopathic Products (MURINE EAR WAX RELIEF OT) Place 1 drop into both ears daily.    [provider]  hydrocortisone cream 1 % Apply 1 application topically 2 (two) times daily.    [provider]  Infant Care Products (JOHNSONS BABY SHAMPOO) SHAM Apply 1 application  topically See admin instructions. Mix 1/2 water and 1/2 shampoo - use to scrub eyebrows and eyelids every morning    [provider]  lacosamide (VIMPAT) 200 MG TABS tablet Take 1 tablet (200 mg total) by mouth 2 (two) times daily. 07/01/22   Levert Feinstein, MD  lactulose (CHRONULAC) 10 GM/15ML solution Take 10 g by mouth every morning.  [provider]  levothyroxine (SYNTHROID) 88 MCG tablet Take 88 mcg by mouth every morning. 01/16/21   [provider]  loperamide (IMODIUM A-D) 2 MG tablet Take 4 mg by mouth as needed for diarrhea or loose stools.    [provider]  magnesium hydroxide (MILK OF MAGNESIA) 400 MG/5ML suspension Take by mouth daily as needed for mild constipation.    [provider]  Omega-3 Fatty Acids (FISH OIL) 500 MG CAPS Take 1,500 mg by mouth every morning.    [provider]  promethazine (PHENERGAN) 25 MG tablet Take 25 mg by mouth as needed for nausea or vomiting.    [provider]  pyridOXINE (VITAMIN B-6) 100 MG tablet Take 100 mg by mouth every morning.    [provider]  rivastigmine (EXELON) 4.6 mg/24hr Place 4.6 mg onto the skin in the morning.    [provider]  Soap & Cleansers (PHISODERM EX) Apply 1 application topically 2 (two) times daily. Apply to face    [provider]  Sunscreens (COPPERTONE DEFEND & CARE EX) Apply topically.    [provider]  Tetrahydrozoline HCl (VISINE OP) Place 1 drop into both eyes 3 (three) times daily.    [provider]  tolnaftate (TINACTIN) 1 % spray Apply 1 application topically See admin instructions. Spray topically between toes daily at bedtime    [provider]     Allergies:     Allergies  Allergen Reactions   Clonidine Derivatives Other (See Comments)    Causes symptomatic bradycardia with syncope and hypotension     Physical Exam:   Vitals  Blood pressure 93/82, pulse 85, temperature 98.1 F (36.7 C), temperature source Oral, resp. rate (!) 25, height 5' (1.524 m), weight 74.8 kg, SpO2 100%.   1. General Middle-aged Caucasian male who appears much younger than his stated age lying in hospital bed somnolent,  2.  Supple neck, unable to provide review of systems, unable to obtain answer questions or follow commands,   3. No F.N deficits,  limited exam, moving all 4 extremities to painful stimuli.  4. Ears and Eyes appear Normal, Conjunctivae clear, PERRLA.  Dry oral Mucosa.  5. Supple Neck, No JVD, No cervical lymphadenopathy appriciated, No Carotid Bruits.  6. Symmetrical Chest wall movement, Good air movement bilaterally, CTAB.  7. RRR, No Gallops, Rubs or Murmurs, No Parasternal Heave.  8. Positive Bowel Sounds, Abdomen Soft, No tenderness, No organomegaly appriciated,No rebound -guarding or rigidity.  9.  No Cyanosis, Normal Skin Turgor, No Skin Rash or Bruise.  10. Good muscle tone,  joints appear normal , no effusions, Normal ROM.  11. No Palpable Lymph Nodes in Neck or Axillae      Data Review:   Recent Labs  Lab 05/02/23 1333 05/02/23 1412  WBC 11.7*  --   HGB 12.0* 12.2*  HCT 38.3* 36.0*  PLT 220  --   MCV 108.5*  --   MCH 34.0  --   MCHC 31.3  --   RDW 13.4  --   LYMPHSABS 0.7  --   MONOABS 0.6  --   EOSABS 0.0  --   BASOSABS 0.0  --     Recent Labs  Lab 05/02/23 1333 05/02/23 1412 05/02/23 1416 05/02/23 1546  NA 147* 147*  --   --   K 4.4 4.3  --   --   CL 115* 115*  --   --   CO2 21*  --   --   --  ANIONGAP 11  --   --   --   GLUCOSE 105* 105*  --   --   BUN 60* 56*  --   --   CREATININE 2.45* 2.40*  --   --   AST 152*  --   --   --   ALT 110*  --   --   --   ALKPHOS 67  --   --   --   BILITOT 0.7  --   --   --   ALBUMIN 1.9*  --   --   --   LATICACIDVEN  --   --  4.5* 3.0*  CALCIUM 8.0*  --   --   --     No results found for: "CHOL", "HDL", "LDLCALC", "LDLDIRECT", "TRIG", "CHOLHDL"  Recent Labs  Lab 05/02/23 1333 05/02/23 1416 05/02/23 1546  LATICACIDVEN  --  4.5* 3.0*  CALCIUM 8.0*  --   --     Recent Labs  Lab 05/02/23 1333 05/02/23 1412 05/02/23 1416 05/02/23 1546  WBC 11.7*  --   --   --   PLT 220  --   --   --   LATICACIDVEN  --   --  4.5* 3.0*  CREATININE 2.45* 2.40*  --   --     Urinalysis    Component Value Date/Time   COLORURINE STRAW (A)  03/03/2023 1446   APPEARANCEUR CLEAR 03/03/2023 1446   LABSPEC 1.008 03/03/2023 1446   PHURINE 6.0 03/03/2023 1446   GLUCOSEU NEGATIVE 03/03/2023 1446   HGBUR NEGATIVE 03/03/2023 1446   BILIRUBINUR NEGATIVE 03/03/2023 1446   KETONESUR NEGATIVE 03/03/2023 1446   PROTEINUR NEGATIVE 03/03/2023 1446   NITRITE NEGATIVE 03/03/2023 1446   LEUKOCYTESUR NEGATIVE 03/03/2023 1446      Imaging Results:    DG Abd Portable 1V  Result Date: 05/02/2023 CLINICAL DATA:  [id number] Constipated [id number] EXAM: PORTABLE ABDOMEN - 1 VIEW COMPARISON:  None Available. FINDINGS: The bowel gas pattern is non-obstructive. Physiological stool burden. No evidence of pneumoperitoneum, within the limitations of a supine film. Subacute/healing fractures of the anterolateral right eighth through tenth ribs noted. The soft tissues are within normal limits. Surgical changes, devices, tubes and lines: None. IMPRESSION: *Nonobstructive bowel gas pattern.  No constipation. *Subacute/healing fractures of the anterolateral right eighth through tenth ribs. Electronically Signed   By: Jules Schick M.D.   On: 05/02/2023 16:14   DG Chest Port 1 View  Result Date: 05/02/2023 CLINICAL DATA:  Weakness. EXAM: PORTABLE CHEST 1 VIEW COMPARISON:  03/03/2023. FINDINGS: Low lung volume. There are nonspecific increased interstitial markings throughout bilateral lungs. However, no frank pulmonary edema. No acute consolidation or lung collapse. Bilateral costophrenic angles are clear. No pneumothorax. Stable cardio-mediastinal silhouette. No acute osseous abnormalities. Distal right humerus metallic hardware noted. The soft tissues are within normal limits. IMPRESSION: *Increased interstitial markings throughout bilateral lungs, nonspecific. No acute consolidation or lung collapse. Electronically Signed   By: Jules Schick M.D.   On: 05/02/2023 16:10        Assessment & Plan:    1.  Gradually progressive metabolic encephalopathy on top of chronic  baseline encephalopathy due to underlying Down syndrome, causing decreased oral intake over the last 2 to 3 weeks, dehydration and AKI.  Early sepsis cannot be ruled out.  Patient had similar presentation according to father few weeks ago secondary to [facility], it was held but looks like it has been resumed again, this medication will be held, will obtain EEG, I  do not think he is having seizures however it needs to be ruled out.  For now continue Vimpat and as needed IV Ativan for any breakthrough seizure activity, according to the father he has not had any seizures for several years.  He has supple neck, no focal deficits and his symptoms seem to be gradually progressive hence I do not think he has a subdural bleed or bacterial meningitis.  Long discussion with patient's mother who is power of attorney and father, treat with IV antibiotics, IV fluids and medications, if he continues to decline then DNR, no major heroics.  2.  Possible early sepsis.  Chest x-ray and KUB stable, he appears nontoxic however will start him on empiric IV Rocephin and azithromycin, will get a UA along with urine culture.  Blood cultures have been drawn.  Monitor.  Lactic acid is elevated but likely due to dehydration, hydrate and monitor.  3.  Severe dehydration and AKI due to poor oral intake over the last 2 to 3 weeks.  Hydrate.  Hold ACE inhibitor.  4.  Hypothyroidism.  Home dose Synthroid.  Check TSH.  5.  Hypertension.  Hold ACE inhibitor due to AKI as needed IV hydralazine.   DVT Prophylaxis Heparin   AM Labs Ordered, also please review Full Orders  Family Communication: Admission, patients condition and plan of care including tests being ordered have been discussed with the patient and father who indicate understanding and agree with the plan and Code Status.   Code Status DNR - DW mother Judeth Cornfield who is power of attorney, also with patient's father.  Likely DC to  TBD  Condition GUARDED    Consults  called: None    Admission status: Inot    Time spent in minutes : 35  Signature  -    Susa Raring M.D on 05/02/2023 at 4:22 PM   -  To page go to [email or web address]

## 2023-05-02 NOTE — ED Provider Notes (Signed)
[place] EMERGENCY DEPARTMENT AT [facility] Provider Note   CSN: [id number] Arrival date & time: 05/02/23  1323     History  Chief Complaint  Patient presents with   Altered Mental Status    [name] is a 57 y.o. male.  57 year old male with prior medical history as detailed below presents for evaluation.  Patient with history of Down syndrome.  Patient is minimally verbal at baseline.  Patient lives at [facility] group home.  This afternoon at lunch apparently the patient had some type of syncopal event.  EMS reports that he was lethargic initially.  Both his mentation and his blood pressure improved during transport.  On arrival, patient is alert, smiling when his name is called.  He appears to be at baseline.  Phone contact made with the patient's father.  The father is en route to the ED.  The father reports that baseline mental status appears to be as described.   The history is provided by the patient and medical records.       Home Medications Prior to Admission medications   Medication Sig Start Date End Date Taking? Authorizing Provider  acetaminophen (TYLENOL) 650 MG CR tablet Take 650 mg by mouth 3 (three) times daily.    [provider]  aluminum hydroxide-magnesium carbonate (ACID GONE) 95-358 MG/15ML SUSP Take 15 mLs by mouth in the morning.    [provider]  Artificial Tear Ointment (LACRI-LUBE OP) Place 1 application into both eyes at bedtime.    [provider]  atorvastatin (LIPITOR) 10 MG tablet Take 10 mg by mouth at bedtime.    [provider]  benazepril (LOTENSIN) 10 MG tablet Take 10 mg by mouth at bedtime.    [provider]  bisacodyl (DULCOLAX) 10 MG suppository Place 10 mg rectally as needed for moderate constipation.    [provider]  brompheniramine-pseudoephedrine-dextromethorphan (DIMETAPP DM) 15-1-5 MG/5ML ELIX Take by mouth every 6 (six) hours as needed.    [provider]  calcitonin, salmon, (MIACALCIN/FORTICAL) 200 UNIT/ACT nasal spray Place 1 spray into alternate nostrils daily.    [provider]  Cholecalciferol (VITAMIN D3) 50 MCG (2000 UT) TABS Take 2,000 Units by mouth every morning.    [provider]  ciclopirox (PENLAC) 8 % solution Apply 1 application topically See admin instructions. Apply topically to toenails every Wednesday evening 01/08/21   [provider]  cloBAZam (ONFI) 10 MG tablet Take 0.5 tablets (5 mg total) by mouth at bedtime. Patient taking differently: Take 5 mg by mouth daily. 02/10/23   [name], NP  Diethyltoluamide (OFF ACTIVE EX) Apply topically.    [provider]  diphenhydrAMINE (BENADRYL) 25 MG tablet Take 25 mg by mouth as needed.    [provider]  doxycycline (VIBRAMYCIN) 50 MG capsule Take 50 mg by mouth daily.    [provider]  guaiFENesin (ROBITUSSIN) 100 MG/5ML liquid Take 5 mLs by mouth every 4 (four) hours as needed for cough or to loosen phlegm.    [provider]  Homeopathic Products (MURINE EAR WAX RELIEF OT) Place 1 drop into both ears daily.    [provider]  hydrocortisone cream 1 % Apply 1 application topically 2 (two) times daily.    [provider]  Infant Care Products (JOHNSONS BABY SHAMPOO) SHAM Apply 1 application  topically See admin instructions. Mix 1/2 water and 1/2 shampoo - use to scrub eyebrows and eyelids every morning  [provider]  lacosamide (VIMPAT) 200 MG TABS tablet Take 1 tablet (200 mg total) by mouth 2 (two) times daily. 07/01/22   Levert Feinstein, MD  lactulose (CHRONULAC) 10 GM/15ML solution Take 10 g by mouth every morning.    [provider]  levothyroxine (SYNTHROID) 88 MCG tablet Take 88 mcg by mouth every morning. 01/16/21   [provider]  loperamide (IMODIUM A-D) 2 MG tablet Take 4 mg by mouth as needed for diarrhea or loose stools.    [provider]  magnesium hydroxide (MILK OF MAGNESIA) 400 MG/5ML suspension Take by mouth daily as needed for mild constipation.    [provider]  Omega-3 Fatty Acids (FISH OIL) 500 MG CAPS Take 1,500 mg by mouth every morning.    [provider]  promethazine (PHENERGAN) 25 MG tablet Take 25 mg by mouth as needed for nausea or vomiting.    [provider]  pyridOXINE (VITAMIN B-6) 100 MG tablet Take 100 mg by mouth every morning.    [provider]  rivastigmine (EXELON) 4.6 mg/24hr Place 4.6 mg onto the skin in the morning.    [provider]  Soap & Cleansers (PHISODERM EX) Apply 1 application topically 2 (two) times daily. Apply to face    [provider]  Sunscreens (COPPERTONE DEFEND & CARE EX) Apply topically.    [provider]  Tetrahydrozoline HCl (VISINE OP) Place 1 drop into both eyes 3 (three) times daily.    [provider]  tolnaftate (TINACTIN) 1 % spray Apply 1 application topically See admin instructions. Spray topically between toes daily at bedtime    [provider]      Allergies    Clonidine derivatives    Review of Systems   Review of Systems  Unable to perform ROS: Acuity of condition    Physical Exam Updated Vital Signs BP 94/72   Pulse 81   Temp 98.1 F (36.7 C) (Oral)   Resp 18   Ht 5' (1.524 m)   Wt 74.8 kg   SpO2 100%   BMI 32.21 kg/m  Physical Exam Vitals and nursing note reviewed.  Constitutional:      General: He is not in acute distress.    Appearance: Normal appearance. He is well-developed.  HENT:     Head: Normocephalic and atraumatic.  Eyes:     Conjunctiva/sclera: Conjunctivae normal.     Pupils: Pupils are equal, round, and reactive to light.  Cardiovascular:     Rate and Rhythm: Normal rate and regular rhythm.     Heart sounds: Normal heart sounds.  Pulmonary:     Effort: Pulmonary effort is normal. No respiratory distress.     Breath sounds:  Normal breath sounds.  Abdominal:     General: There is no distension.     Palpations: Abdomen is soft.     Tenderness: There is no abdominal tenderness.  Musculoskeletal:        General: No deformity. Normal range of motion.     Cervical back: Normal range of motion and neck supple.  Skin:    General: Skin is warm and dry.  Neurological:     General: No focal deficit present.     Mental Status: He is alert.     Comments: Alert, nonverbal at baseline, appropriately interactive as per baseline mental status.  Moves all 4 extremities     ED Results / Procedures / Treatments   Labs (all labs ordered are listed,  but only abnormal results are displayed) Labs Reviewed  I-STAT CG4 LACTIC ACID, ED - Abnormal; Notable for the following components:      Result Value   Lactic Acid, Venous 4.5 (*)    All other components within normal limits  I-STAT CHEM 8, ED - Abnormal; Notable for the following components:   Sodium 147 (*)    Chloride 115 (*)    BUN 56 (*)    Creatinine, Ser 2.40 (*)    Glucose, Bld 105 (*)    Calcium, Ion 1.02 (*)    TCO2 19 (*)    Hemoglobin 12.2 (*)    HCT 36.0 (*)    All other components within normal limits  CULTURE, BLOOD (ROUTINE X 2)  CULTURE, BLOOD (ROUTINE X 2)  CBC WITH DIFFERENTIAL/PLATELET  COMPREHENSIVE METABOLIC PANEL  URINALYSIS, W/ REFLEX TO CULTURE (INFECTION SUSPECTED)  RAPID URINE DRUG SCREEN, HOSP PERFORMED  CBG MONITORING, ED  TROPONIN I (HIGH SENSITIVITY)    EKG EKG Interpretation Date/Time:  Monday May 02 2023 13:40:10 EST Ventricular Rate:  81 PR Interval:  163 QRS Duration:  138 QT Interval:  399 QTC Calculation: 464 R Axis:   -36  Text Interpretation: Sinus rhythm Right bundle branch block Confirmed by Kristine Royal [phone]) on 05/02/2023 1:46:08 PM  Radiology No results found.  Procedures Procedures    Medications Ordered in ED Medications  sodium chloride 0.9 % bolus 1,000 mL (has no administration in time  range)  sodium chloride 0.9 % bolus 1,000 mL (1,000 mLs Intravenous New Bag/Given 05/02/23 1350)    ED Course/ Medical Decision Making/ A&P                                 Medical Decision Making Amount and/or Complexity of Data Reviewed Labs: ordered. Radiology: ordered.  Risk Decision regarding hospitalization.    Medical Screen Complete  This patient presented to the ED with complaint of AMS/hypotension.  This complaint involves an extensive number of treatment options. The initial differential diagnosis includes, but is not limited to, metabolic abnormality, etc.  This presentation is: Acute, Self-Limited, Previously Undiagnosed, Uncertain Prognosis, Complicated, Systemic Symptoms, and Threat to Life/Bodily Function  Patient with possible syncope/AMS.   Initial hypotension improved with IVF.   Labs suggest significant dehydration. Father reports recent decreased PO intake.   Patient would benefit from admission.   Additional history obtained:  Additional history obtained from EMS and Family External records from outside sources obtained and reviewed including prior ED visits and prior Inpatient records.    Lab Tests:  I ordered and personally interpreted labs.  The pertinent results include:  cbc cmp   Imaging Studies ordered:  I ordered imaging studies including cxr  I independently visualized and interpreted obtained imaging which showed nad I agree with the radiologist interpretation.   Cardiac Monitoring:  The patient was maintained on a cardiac monitor.  I personally viewed and interpreted the cardiac monitor which showed an underlying rhythm of: NSR   Problem List / ED Course:  AMS AKI hypernatremia    Reevaluation:  After the interventions noted above, I reevaluated the patient and found that they have: improved   Disposition:  After consideration of the diagnostic results and the patients response to treatment, I feel that the patent  would benefit from admission.  CRITICAL CARE Performed by: Wynetta Fines   Total critical care time: 30 minutes  Critical care time was  exclusive of separately billable procedures and treating other patients.  Critical care was necessary to treat or prevent imminent or life-threatening deterioration.  Critical care was time spent personally by me on the following activities: development of treatment plan with patient and/or surrogate as well as nursing, discussions with consultants, evaluation of patient's response to treatment, examination of patient, obtaining history from patient or surrogate, ordering and performing treatments and interventions, ordering and review of laboratory studies, ordering and review of radiographic studies, pulse oximetry and re-evaluation of patient's condition.           Final Clinical Impression(s) / ED Diagnoses Final diagnoses:  AKI (acute kidney injury) (HCC)  Hypernatremia    Rx / DC Orders ED Discharge Orders     None         Wynetta Fines, MD 05/02/23 1623

## 2023-05-02 NOTE — Progress Notes (Signed)
EEG complete - results pending 

## 2023-05-03 DIAGNOSIS — N179 Acute kidney failure, unspecified: Secondary | ICD-10-CM | POA: Diagnosis not present

## 2023-05-03 DIAGNOSIS — R569 Unspecified convulsions: Secondary | ICD-10-CM

## 2023-05-03 LAB — CBC WITH DIFFERENTIAL/PLATELET
Abs Immature Granulocytes: 0.04 10*3/uL (ref 0.00–0.07)
Basophils Absolute: 0 10*3/uL (ref 0.0–0.1)
Basophils Relative: 1 %
Eosinophils Absolute: 0 10*3/uL (ref 0.0–0.5)
Eosinophils Relative: 0 %
HCT: 37.5 % — ABNORMAL LOW (ref 39.0–52.0)
Hemoglobin: 12 g/dL — ABNORMAL LOW (ref 13.0–17.0)
Immature Granulocytes: 1 %
Lymphocytes Relative: 17 %
Lymphs Abs: 1.5 10*3/uL (ref 0.7–4.0)
MCH: 33.9 pg (ref 26.0–34.0)
MCHC: 32 g/dL (ref 30.0–36.0)
MCV: 105.9 fL — ABNORMAL HIGH (ref 80.0–100.0)
Monocytes Absolute: 0.5 10*3/uL (ref 0.1–1.0)
Monocytes Relative: 6 %
Neutro Abs: 6.4 10*3/uL (ref 1.7–7.7)
Neutrophils Relative %: 75 %
Platelets: 201 10*3/uL (ref 150–400)
RBC: 3.54 MIL/uL — ABNORMAL LOW (ref 4.22–5.81)
RDW: 13.2 % (ref 11.5–15.5)
WBC: 8.5 10*3/uL (ref 4.0–10.5)
nRBC: 0 % (ref 0.0–0.2)

## 2023-05-03 LAB — LACTIC ACID, PLASMA: Lactic Acid, Venous: 1.8 mmol/L (ref 0.5–1.9)

## 2023-05-03 LAB — MAGNESIUM: Magnesium: 1.8 mg/dL (ref 1.7–2.4)

## 2023-05-03 LAB — COMPREHENSIVE METABOLIC PANEL
ALT: 226 U/L — ABNORMAL HIGH (ref 0–44)
AST: 266 U/L — ABNORMAL HIGH (ref 15–41)
Albumin: 1.8 g/dL — ABNORMAL LOW (ref 3.5–5.0)
Alkaline Phosphatase: 63 U/L (ref 38–126)
Anion gap: 8 (ref 5–15)
BUN: 45 mg/dL — ABNORMAL HIGH (ref 6–20)
CO2: 22 mmol/L (ref 22–32)
Calcium: 7.7 mg/dL — ABNORMAL LOW (ref 8.9–10.3)
Chloride: 108 mmol/L (ref 98–111)
Creatinine, Ser: 1.25 mg/dL — ABNORMAL HIGH (ref 0.61–1.24)
GFR, Estimated: >60 mL/min (ref >60)
Glucose, Bld: 113 mg/dL — ABNORMAL HIGH (ref 70–99)
Potassium: 4 mmol/L (ref 3.5–5.1)
Sodium: 138 mmol/L (ref 135–145)
Total Bilirubin: 0.5 mg/dL (ref <1.2)
Total Protein: 5.3 g/dL — ABNORMAL LOW (ref 6.5–8.1)

## 2023-05-03 LAB — T4, FREE: Free T4: 1.17 ng/dL — ABNORMAL HIGH (ref 0.61–1.12)

## 2023-05-03 LAB — RESPIRATORY PANEL BY PCR

## 2023-05-03 LAB — CREATININE, SERUM
Creatinine, Ser: 1.73 mg/dL — ABNORMAL HIGH (ref 0.61–1.24)
GFR, Estimated: 46 mL/min — ABNORMAL LOW (ref >60)

## 2023-05-03 LAB — PROTIME-INR
INR: 1.4 — ABNORMAL HIGH (ref 0.8–1.2)
Prothrombin Time: 17.8 s — ABNORMAL HIGH (ref 11.4–15.2)

## 2023-05-03 LAB — C-REACTIVE PROTEIN: CRP: 9.2 mg/dL — ABNORMAL HIGH (ref <1.0)

## 2023-05-03 LAB — AMMONIA: Ammonia: <10 umol/L (ref 9–35)

## 2023-05-03 LAB — HIV ANTIBODY (ROUTINE TESTING W REFLEX): HIV Screen 4th Generation wRfx: NONREACTIVE

## 2023-05-03 LAB — PROCALCITONIN
Procalcitonin: 0.54 ng/mL
Procalcitonin: 0.45 ng/mL

## 2023-05-03 LAB — CBG MONITORING, ED: Glucose-Capillary: 134 mg/dL — ABNORMAL HIGH (ref 70–99)

## 2023-05-03 LAB — TSH: TSH: 4.126 u[IU]/mL (ref 0.350–4.500)

## 2023-05-03 LAB — PHOSPHORUS: Phosphorus: 2.6 mg/dL (ref 2.5–4.6)

## 2023-05-03 LAB — CK: Total CK: 4442 U/L — ABNORMAL HIGH (ref 49–397)

## 2023-05-03 LAB — VITAMIN B12: Vitamin B-12: 577 pg/mL (ref 180–914)

## 2023-05-03 LAB — MRSA NEXT GEN BY PCR, NASAL: MRSA by PCR Next Gen: NOT DETECTED

## 2023-05-03 LAB — FOLATE: Folate: 9.9 ng/mL (ref >5.9)

## 2023-05-03 LAB — VITAMIN D 25 HYDROXY (VIT D DEFICIENCY, FRACTURES): Vit D, 25-Hydroxy: 82.56 ng/mL (ref 30–100)

## 2023-05-03 LAB — SARS CORONAVIRUS 2 BY RT PCR: SARS Coronavirus 2 by RT PCR: NEGATIVE

## 2023-05-03 MED ORDER — IPRATROPIUM-ALBUTEROL 0.5-2.5 (3) MG/3ML IN SOLN: 3.0000 mL | RESPIRATORY_TRACT | Status: DC | PRN

## 2023-05-03 MED ORDER — TRAZODONE HCL 50 MG PO TABS: 50.0000 mg | ORAL_TABLET | Freq: Every evening | ORAL | Status: DC | PRN

## 2023-05-03 MED ORDER — HYDRALAZINE HCL 20 MG/ML IJ SOLN: 10.0000 mg | INTRAMUSCULAR | Status: DC | PRN

## 2023-05-03 MED ORDER — CALCIUM GLUCONATE-NACL 2-0.675 GM/100ML-% IV SOLN
2.0000 g | Freq: Once | INTRAVENOUS | Status: AC
  Administered 2023-05-03: 2000 mg via INTRAVENOUS
  Filled 2023-05-03: qty 100

## 2023-05-03 MED ORDER — MAGNESIUM SULFATE 2 GM/50ML IV SOLN
2.0000 g | Freq: Once | INTRAVENOUS | Status: AC
  Administered 2023-05-03: 2 g via INTRAVENOUS
  Filled 2023-05-03: qty 50

## 2023-05-03 MED ORDER — SENNOSIDES-DOCUSATE SODIUM 8.6-50 MG PO TABS: 1.0000 | ORAL_TABLET | Freq: Every evening | ORAL | Status: DC | PRN

## 2023-05-03 MED ORDER — METOPROLOL TARTRATE 5 MG/5ML IV SOLN: 5.0000 mg | INTRAVENOUS | Status: DC | PRN

## 2023-05-03 MED ORDER — DEXTROSE 5 % IV SOLN: INTRAVENOUS | Status: DC

## 2023-05-03 NOTE — Evaluation (Signed)
Clinical/Bedside Swallow Evaluation Patient Details  Name: [name] MRN: [id number] Date of Birth: 1966/03/31  Today's Date: 05/03/2023 Time: SLP Start Time (ACUTE ONLY): 0825 SLP Stop Time (ACUTE ONLY): 0838 SLP Time Calculation (min) (ACUTE ONLY): 13 min  Past Medical History:  Past Medical History:  Diagnosis Date   Down's syndrome    GERD (gastroesophageal reflux disease)    Hypertension    Hypothyroid    Lives in group home    [facility]-[phone]-[place]oxanne-nurse   Nystagmus    Obstructive sleep apnea (adult) (pediatric)    does use a cpap at night   Seizure disorder (HCC) 10/28/2016   Seizures (HCC)    Past Surgical History:  Past Surgical History:  Procedure Laterality Date   IRRIGATION AND DEBRIDEMENT  07/01/2014       ORIF HUMERUS FRACTURE Left 02/05/2013   Procedure: OPEN REDUCTION INTERNAL FIXATION (ORIF) MEDIAL HUMERUS CONDYLE FRACTURE;  Surgeon: Jodi Marble, MD;  Location: [facility];  Service: Orthopedics;  Laterality: Left;   Right Elbow surgery     HPI:  [name]  is a 57 y.o. male with Down syndrome, apparently nonverbal at baseline,  who lives in a group home comes into the hospital with altered mental status.  Patient is brought in by his father, apparently patient at baseline is mildly drowsy and usually is alert 10 to 12 hours a day however for the last 2 to 3 weeks he has been increasingly more drowsy not eating or drinking well, finally they brought him to the ER today where he was diagnosed with mild leukocytosis, severe dehydration and AKI. Head CT 12/2 no acute.  CXR 12/2: "Increased interstitial markings throughout bilateral lungs,  nonspecific. No acute consolidation or lung collapse." Pt with hx recurrent falls, seizures, hypothyroidism, hypertension    Assessment / Plan / Recommendation  Clinical Impression  Pt presents with a mild oral dysphagia.  There was anterior loss with puree and mild oral  residue with regular texture solid.  Slighly prolonged oral phase was noted with soft solid texture.  Pt with some missing dentition but unable to visualize full oral cavity. Pt unable to complete OME.  No obvious deficits or assymmetry, but pt with macroglossia 2/2 DS.  Pt tolerated all consistencies trialed with no clinical s/s of aspiration. Pt has no further ST needs at this time.    Recommend mechanicals soft diet with thin liquids.  SLP Visit Diagnosis: Dysphagia, oral phase (R13.11)    Aspiration Risk  Mild aspiration risk    Diet Recommendation Dysphagia 3 (Mech soft);Thin liquid    Liquid Administration via: Cup;Straw Medication Administration:  (as tolerated, no specific precautions) Compensations: Slow rate;Small sips/bites Postural Changes: Seated upright at 90 degrees    Other  Recommendations Oral Care Recommendations: Oral care BID    Recommendations for follow up therapy are one component of a multi-disciplinary discharge planning process, led by the attending physician.  Recommendations may be updated based on patient status, additional functional criteria and insurance authorization.  Follow up Recommendations No SLP follow up      Assistance Recommended at Discharge  N/A  Functional Status Assessment Patient has not had a recent decline in their functional status  Frequency and Duration  (N/A)          Prognosis Prognosis for improved oropharyngeal function:  (N/A)      Swallow Study   General Date of Onset: 05/02/23 HPI: [name]  is a 57 y.o. male with Down  syndrome, apparently nonverbal at baseline,  who lives in a group home comes into the hospital with altered mental status.  Patient is brought in by his father, apparently patient at baseline is mildly drowsy and usually is alert 10 to 12 hours a day however for the last 2 to 3 weeks he has been increasingly more drowsy not eating or drinking well, finally they brought him to the ER today where  he was diagnosed with mild leukocytosis, severe dehydration and AKI. Head CT 12/2 no acute.  CXR 12/2: "Increased interstitial markings throughout bilateral lungs,  nonspecific. No acute consolidation or lung collapse." Pt with hx recurrent falls, seizures, hypothyroidism, hypertension Type of Study: Bedside Swallow Evaluation Previous Swallow Assessment: None Diet Prior to this Study: Regular;Thin liquids (Level 0) Temperature Spikes Noted: No Respiratory Status: Nasal cannula History of Recent Intubation: No Behavior/Cognition: Alert;Cooperative;Doesn't follow directions Oral Cavity Assessment: Dry Oral Care Completed by SLP: No Oral Cavity - Dentition: Missing dentition Self-Feeding Abilities: Total assist Patient Positioning: Upright in bed Baseline Vocal Quality: Not observed Volitional Cough: Cognitively unable to elicit Volitional Swallow: Unable to elicit    Oral/Motor/Sensory Function Overall Oral Motor/Sensory Function:  (Unabl to assess.  Macroglossia noted)   Ice Chips Ice chips: Not tested   Thin Liquid Thin Liquid: Within functional limits Presentation: Straw    Nectar Thick Nectar Thick Liquid: Not tested   Honey Thick Honey Thick Liquid: Not tested   Puree Puree: Impaired Presentation: Spoon Oral Phase Functional Implications: Right anterior spillage;Left anterior spillage   Solid     Solid: Impaired Oral Phase Functional Implications: Oral residue      Kerrie Pleasure, MA, CCC-SLP Acute Rehabilitation Services Office: [phone] 05/03/2023,8:53 AM

## 2023-05-03 NOTE — Hospital Course (Addendum)
Brief Narrative:  57 year old with Down syndrome apparently nonverbal at baseline, recurrent falls, seizure, HTN, hypothyroidism lives in a group home admitted for AMS.  Family has noticed that over the last 2-3 weeks he is having poor oral intake and increasing drowsiness.  In the ER noted to have severe dehydration with AKI and mild leukocytosis.  Recently had similar presentation when he was started on Onfi for seizures and eventually with help with improvement in mentation.   Assessment & Plan:  Principal Problem:   AKI (acute kidney injury) (HCC) Active Problems:   Obstructive sleep apnea   Down syndrome   Seizure disorder (HCC)   Acute on chronic metabolic encephalopathy History of Down syndrome Adult failure to thrive -Unclear etiology at this time.  Recently had similar episode after he was started on Onfi for seizures but has been withheld since.  For now he is on Vimpat and IV as needed Ativan.  EEG ordered. -Check TSH, ammonia, vitamin D -CT head, RUQ, respiratory panel, cultures negative  SIRS Dehydration with AKI uremia, hypernatremia Hypocalcemia/hypomagnesemia - No obvious evidence of infection at this time.  Some chemo anemic instability could be from dehydration.  For now we will give IV fluids, clinically monitor. -Baseline creatinine 1.7, admission creatinine 2.4.  Creatinine improving but BUN still elevated.  Continue IV fluids -Replete electrolytes  Rhabdomyolysis Transaminitis - CK4 442, repeat tomorrow morning.  Continue IV fluids -Trend LFTs  Hypothyroidism - Synthroid  Essential hypertension -Holding home medication.  IV as needed   DVT prophylaxis: Subcu heparin Code Status: DNR Family Communication: Family is present at bedside Status is: Inpatient continue hospital stay for management of AKI, uremia    Subjective:  Seen and examined at bedside. Patient's father and caregivers at bedside.  To them he seems slightly better compared to  yesterday in terms of his movement.   Examination:  General exam: No meaningful interaction, opens his eyes, dry mouth Respiratory system: Clear to auscultation. Respiratory effort normal. Cardiovascular system: S1 & S2 heard, RRR. No JVD, murmurs, rubs, gallops or clicks. No pedal edema. Gastrointestinal system: Abdomen is nondistended, soft and nontender. No organomegaly or masses felt. Normal bowel sounds heard. Central nervous system: Unable to assess but opens eyes to his name Extremities: Contracted Skin: No rashes, lesions or ulcers Psychiatry: Unable to assess

## 2023-05-03 NOTE — ED Notes (Signed)
ED TO INPATIENT HANDOFF REPORT  ED Nurse Name and Phone #: Gustavo Lah, rn [phone]   S Name/Age/Gender [name] 57 y.o. male Room/Bed: 039C/039C  Code Status   Code Status: Limited: Do not attempt resuscitation (DNR) -DNR-LIMITED -Do Not Intubate/DNI   Home/SNF/Other Nursing Home Pt is nonverbal Is this baseline? Yes   Triage Complete: Triage complete  Chief Complaint AKI (acute kidney injury) (HCC) [N17.9]  Triage Note Hx of Down syndrome and nonverbal. Lives in [facility]. Baseline awake and able to follow some commands. Before lunch around 1210, pt was sitting on wheelchair slumped forward and appeared lethargic. GCS of 8 and cool to touch, mottled. Unable to get manual bp with EMS. 250 cc NS bolus by EMS.   EMS VS:  HR 50s BP 50s systolic   Allergies Allergies  Allergen Reactions   Clonidine Derivatives Other (See Comments)    Causes symptomatic bradycardia with syncope and hypotension    Level of Care/Admitting Diagnosis ED Disposition     ED Disposition  Admit   Condition  --   Comment  Hospital Area: [facility] [[id number]]  Level of Care: Telemetry Medical [[id number]]  May admit patient to [facility] or [facility] if equivalent level of care is available:: No  Covid Evaluation: Asymptomatic - no recent exposure (last 10 days) testing not required  Diagnosis: AKI (acute kidney injury) [facility]) [[id number]]  Admitting Physician: Mee Hives  Attending Physician: Leroy Sea [[id number]]  Bed request comments: 5W  Certification:: I certify there are rare and unusual circumstances requiring inpatient admission  Expected Medical Readiness: 05/04/2023          B Medical/Surgery History Past Medical History:  Diagnosis Date   Down's syndrome    GERD (gastroesophageal reflux disease)    Hypertension    Hypothyroid    Lives in group home    [phone]-Roxanne-nurse   Nystagmus     Obstructive sleep apnea (adult) (pediatric)    does use a cpap at night   Seizure disorder (HCC) 10/28/2016   Seizures (HCC)    Past Surgical History:  Procedure Laterality Date   IRRIGATION AND DEBRIDEMENT  07/01/2014       ORIF HUMERUS FRACTURE Left 02/05/2013   Procedure: OPEN REDUCTION INTERNAL FIXATION (ORIF) MEDIAL HUMERUS CONDYLE FRACTURE;  Surgeon: Jodi Marble, MD;  Location: [facility];  Service: Orthopedics;  Laterality: Left;   Right Elbow surgery       A IV Location/Drains/Wounds Patient Lines/Drains/Airways Status     Active Line/Drains/Airways     Name Placement date Placement time Site Days   Peripheral IV 05/02/23 20 G Left Antecubital 05/02/23  1328  Antecubital  1   Peripheral IV 05/02/23 20 G Anterior;Left Wrist 05/02/23  1344  Wrist  1            Intake/Output Last 24 hours  Intake/Output Summary (Last 24 hours) at 05/03/2023 0953 Last data filed at 05/03/2023 0226 Gross per 24 hour  Intake 3250 ml  Output --  Net 3250 ml    Labs/Imaging Results for orders placed or performed during the hospital encounter of 05/02/23 (from the past 48 hour(s))  CBC with Differential     Status: Abnormal   Collection Time: 05/02/23  1:33 PM  Result Value Ref Range   WBC 11.7 (H) 4.0 - 10.5 K/uL   RBC 3.53 (L) 4.22 - 5.81 MIL/uL   Hemoglobin 12.0 (L) 13.0 - 17.0 g/dL  HCT 38.3 (L) 39.0 - 52.0 %   MCV 108.5 (H) 80.0 - 100.0 fL   MCH 34.0 26.0 - 34.0 pg   MCHC 31.3 30.0 - 36.0 g/dL   RDW 82.9 56.2 - 13.0 %   Platelets 220 150 - 400 K/uL   nRBC 0.0 0.0 - 0.2 %   Neutrophils Relative % 88 %   Neutro Abs 10.3 (H) 1.7 - 7.7 K/uL   Lymphocytes Relative 6 %   Lymphs Abs 0.7 0.7 - 4.0 K/uL   Monocytes Relative 5 %   Monocytes Absolute 0.6 0.1 - 1.0 K/uL   Eosinophils Relative 0 %   Eosinophils Absolute 0.0 0.0 - 0.5 K/uL   Basophils Relative 0 %   Basophils Absolute 0.0 0.0 - 0.1 K/uL   Immature Granulocytes 1 %   Abs Immature Granulocytes 0.10  (H) 0.00 - 0.07 K/uL    Comment: Performed at [facility], [address], [place], [place] [address]  Troponin I (High Sensitivity)     Status: None   Collection Time: 05/02/23  1:33 PM  Result Value Ref Range   Troponin I (High Sensitivity) 9 <18 ng/L    Comment: (NOTE) Elevated high sensitivity troponin I (hsTnI) values and significant  changes across serial measurements may suggest ACS but many other  chronic and acute conditions are known to elevate hsTnI results.  Refer to the "Links" section for chest pain algorithms and additional  guidance. Performed at [facility], [address], [place], [place] [address]   Comprehensive metabolic panel     Status: Abnormal   Collection Time: 05/02/23  1:33 PM  Result Value Ref Range   Sodium 147 (H) 135 - 145 mmol/L   Potassium 4.4 3.5 - 5.1 mmol/L   Chloride 115 (H) 98 - 111 mmol/L   CO2 21 (L) 22 - 32 mmol/L   Glucose, Bld 105 (H) 70 - 99 mg/dL    Comment: Glucose reference range applies only to samples taken after fasting for at least 8 hours.   BUN 60 (H) 6 - 20 mg/dL   Creatinine, Ser 9.52 (H) 0.61 - 1.24 mg/dL   Calcium 8.0 (L) 8.9 - 10.3 mg/dL   Total Protein 5.8 (L) 6.5 - 8.1 g/dL   Albumin 1.9 (L) 3.5 - 5.0 g/dL   AST 841 (H) 15 - 41 U/L   ALT 110 (H) 0 - 44 U/L   Alkaline Phosphatase 67 38 - 126 U/L   Total Bilirubin 0.7 <1.2 mg/dL   GFR, Estimated 30 (L) >60 mL/min    Comment: (NOTE) Calculated using the CKD-EPI Creatinine Equation (2021)    Anion gap 11 5 - 15    Comment: Performed at [facility], [address], [place], [place] [address]  Culture, blood (routine x 2)     Status: None (Preliminary result)   Collection Time: 05/02/23  1:57 PM   Specimen: BLOOD LEFT HAND  Result Value Ref Range   Specimen Description BLOOD LEFT HAND    Special Requests      BOTTLES DRAWN AEROBIC ONLY Blood Culture results may not be optimal due to an inadequate volume of blood received in culture bottles    Culture      NO GROWTH < 24 HOURS Performed at [facility], [address], [place], [place] [address]    Report Status PENDING   CBG monitoring, ED     Status: None   Collection Time: 05/02/23  2:00 PM  Result Value Ref Range   Glucose-Capillary 93 70 - 99 mg/dL    Comment: Glucose reference range applies only to samples taken after fasting for at least 8 hours.  I-stat chem 8, ED     Status: Abnormal   Collection Time: 05/02/23  2:12 PM  Result Value Ref Range   Sodium 147 (H) 135 - 145 mmol/L   Potassium 4.3 3.5 - 5.1 mmol/L   Chloride 115 (H) 98 - 111 mmol/L   BUN 56 (H) 6 - 20 mg/dL   Creatinine, Ser 1.91 (H) 0.61 - 1.24 mg/dL   Glucose, Bld 478 (H) 70 - 99 mg/dL    Comment: Glucose reference range applies only to samples taken after fasting for at least 8 hours.   Calcium, Ion 1.02 (L) 1.15 - 1.40 mmol/L   TCO2 19 (L) 22 - 32 mmol/L   Hemoglobin 12.2 (L) 13.0 - 17.0 g/dL   HCT 29.5 (L) 62.1 - 30.8 %  I-Stat Lactic Acid     Status: Abnormal   Collection Time: 05/02/23  2:16 PM  Result Value Ref Range   Lactic Acid, Venous 4.5 (HH) 0.5 - 1.9 mmol/L   Comment NOTIFIED PHYSICIAN   TSH     Status: Abnormal   Collection Time: 05/02/23  3:24 PM  Result Value Ref Range   TSH 6.481 (H) 0.350 - 4.500 uIU/mL    Comment: Performed by a 3rd Generation assay with a functional sensitivity of <=0.01 uIU/mL. Performed at [facility], [address], [place], [place] [address]   I-Stat Lactic Acid     Status: Abnormal   Collection Time: 05/02/23  3:46 PM  Result Value Ref Range   Lactic Acid, Venous 3.0 (HH) 0.5 - 1.9 mmol/L   Comment NOTIFIED PHYSICIAN   Urinalysis, w/ Reflex to Culture (Infection Suspected) -Urine, Clean Catch     Status: Abnormal   Collection Time: 05/02/23  5:30 PM  Result Value Ref Range   Specimen Source URINE, CLEAN CATCH    Color, Urine YELLOW YELLOW   APPearance HAZY (A) CLEAR   Specific Gravity, Urine 1.011 1.005 - 1.030   pH 6.0 5.0 - 8.0    Glucose, UA NEGATIVE NEGATIVE mg/dL   Hgb urine dipstick LARGE (A) NEGATIVE   Bilirubin Urine NEGATIVE NEGATIVE   Ketones, ur NEGATIVE NEGATIVE mg/dL   Protein, ur NEGATIVE NEGATIVE mg/dL   Nitrite POSITIVE (A) NEGATIVE   Leukocytes,Ua MODERATE (A) NEGATIVE   RBC / HPF 0-5 0 - 5 RBC/hpf   WBC, UA 6-10 0 - 5 WBC/hpf    Comment:        Reflex urine culture not performed if WBC <=10, OR if Squamous epithelial cells >5. If Squamous epithelial cells >5 suggest recollection.    Bacteria, UA FEW (A) NONE SEEN   Squamous Epithelial / HPF 0-5 0 - 5 /HPF   Mucus PRESENT    Hyaline Casts, UA PRESENT     Comment: Performed at [facility], [address] [place], [place] [address]  Urine rapid drug screen (hosp performed)     Status: None   Collection Time: 05/02/23  5:30 PM  Result Value Ref Range   Opiates NONE DETECTED NONE DETECTED   Cocaine NONE DETECTED NONE DETECTED   Benzodiazepines NONE DETECTED NONE DETECTED   Amphetamines NONE DETECTED NONE DETECTED   Tetrahydrocannabinol NONE DETECTED NONE DETECTED   Barbiturates NONE DETECTED NONE DETECTED    Comment: (NOTE) DRUG SCREEN FOR  MEDICAL PURPOSES ONLY.  IF CONFIRMATION IS NEEDED FOR ANY PURPOSE, NOTIFY LAB WITHIN 5 DAYS.  LOWEST DETECTABLE LIMITS FOR URINE DRUG SCREEN Drug Class                     Cutoff (ng/mL) Amphetamine and metabolites    1000 Barbiturate and metabolites    200 Benzodiazepine                 200 Opiates and metabolites        300 Cocaine and metabolites        300 THC                            50 Performed at [facility], [address], [place], [place] [address]   MRSA Next Gen by PCR, Nasal     Status: None   Collection Time: 05/03/23  1:01 AM   Specimen: Nasal Mucosa; Nasal Swab  Result Value Ref Range   MRSA by PCR Next Gen NOT DETECTED NOT DETECTED    Comment: (NOTE) The GeneXpert MRSA Assay (FDA approved for NASAL specimens only), is one component of a comprehensive MRSA  colonization surveillance program. It is not intended to diagnose MRSA infection nor to guide or monitor treatment for MRSA infections. Test performance is not FDA approved in patients less than 70 years old. Performed at [facility], [address], [place], [place] [address]   Respiratory (~20 pathogens) panel by PCR     Status: None   Collection Time: 05/03/23  1:01 AM   Specimen: Nasal Mucosa; Respiratory  Result Value Ref Range   Adenovirus NOT DETECTED NOT DETECTED   Coronavirus 229E NOT DETECTED NOT DETECTED    Comment: (NOTE) The Coronavirus on the Respiratory Panel, DOES NOT test for the novel  Coronavirus (2019 nCoV)    Coronavirus HKU1 NOT DETECTED NOT DETECTED   Coronavirus NL63 NOT DETECTED NOT DETECTED   Coronavirus OC43 NOT DETECTED NOT DETECTED   Metapneumovirus NOT DETECTED NOT DETECTED   Rhinovirus / Enterovirus NOT DETECTED NOT DETECTED   Influenza A NOT DETECTED NOT DETECTED   Influenza B NOT DETECTED NOT DETECTED   Parainfluenza Virus 1 NOT DETECTED NOT DETECTED   Parainfluenza Virus 2 NOT DETECTED NOT DETECTED   Parainfluenza Virus 3 NOT DETECTED NOT DETECTED   Parainfluenza Virus 4 NOT DETECTED NOT DETECTED   Respiratory Syncytial Virus NOT DETECTED NOT DETECTED   Bordetella pertussis NOT DETECTED NOT DETECTED   Bordetella Parapertussis NOT DETECTED NOT DETECTED   Chlamydophila pneumoniae NOT DETECTED NOT DETECTED   Mycoplasma pneumoniae NOT DETECTED NOT DETECTED    Comment: Performed at [facility], [address], [place], [place] [address]  SARS Coronavirus 2 by RT PCR (hospital order, performed in [facility] hospital lab) *cepheid single result test* Nasal Mucosa     Status: None   Collection Time: 05/03/23  1:01 AM   Specimen: Nasal Mucosa; Nasal Swab  Result Value Ref Range   SARS Coronavirus 2 by RT PCR NEGATIVE NEGATIVE    Comment: Performed at [facility], [address], [place], [place] [address]  Creatinine, serum      Status: Abnormal   Collection Time: 05/03/23  1:02 AM  Result Value Ref Range   Creatinine, Ser 1.73 (H) 0.61 - 1.24 mg/dL   GFR, Estimated 46 (L) >60 mL/min    Comment: (NOTE) Calculated using  the CKD-EPI Creatinine Equation (2021) Performed at [facility], [address] [place], [place] [address]   Procalcitonin     Status: None   Collection Time: 05/03/23  1:02 AM  Result Value Ref Range   Procalcitonin 0.54 ng/mL    Comment:        Interpretation: PCT > 0.5 ng/mL and <= 2 ng/mL: Systemic infection (sepsis) is possible, but other conditions are known to elevate PCT as well. (NOTE)       Sepsis PCT Algorithm           Lower Respiratory Tract                                      Infection PCT Algorithm    ----------------------------     ----------------------------         PCT < 0.25 ng/mL                PCT < 0.10 ng/mL          Strongly encourage             Strongly discourage   discontinuation of antibiotics    initiation of antibiotics    ----------------------------     -----------------------------       PCT 0.25 - 0.50 ng/mL            PCT 0.10 - 0.25 ng/mL               OR       >80% decrease in PCT            Discourage initiation of                                            antibiotics      Encourage discontinuation           of antibiotics    ----------------------------     -----------------------------         PCT >= 0.50 ng/mL              PCT 0.26 - 0.50 ng/mL                AND       <80% decrease in PCT             Encourage initiation of                                             antibiotics       Encourage continuation           of antibiotics    ----------------------------     -----------------------------        PCT >= 0.50 ng/mL                  PCT > 0.50 ng/mL               AND         increase in PCT                  Strongly encourage  initiation of antibiotics    Strongly encourage  escalation           of antibiotics                                     -----------------------------                                           PCT <= 0.25 ng/mL                                                 OR                                        > 80% decrease in PCT                                      Discontinue / Do not initiate                                             antibiotics  Performed at [facility], [address], [place], [place] [address]   CK     Status: Abnormal   Collection Time: 05/03/23  1:02 AM  Result Value Ref Range   Total CK 4,442 (H) 49 - 397 U/L    Comment: RESULT CONFIRMED BY MANUAL DILUTION Performed at [facility], [address] [place], [place] [address]   Protime-INR     Status: Abnormal   Collection Time: 05/03/23  1:02 AM  Result Value Ref Range   Prothrombin Time 17.8 (H) 11.4 - 15.2 seconds   INR 1.4 (H) 0.8 - 1.2    Comment: (NOTE) INR goal varies based on device and disease states. Performed at [facility], [address] [place], [place] [address]   T4, free     Status: Abnormal   Collection Time: 05/03/23  1:02 AM  Result Value Ref Range   Free T4 1.17 (H) 0.61 - 1.12 ng/dL    Comment: (NOTE) Biotin ingestion may interfere with free T4 tests. If the results are inconsistent with the TSH level, previous test results, or the clinical presentation, then consider biotin interference. If needed, order repeat testing after stopping biotin. Performed at [facility], [address], [place], [place] [address]   CBG monitoring, ED     Status: Abnormal   Collection Time: 05/03/23  1:23 AM  Result Value Ref Range   Glucose-Capillary 134 (H) 70 - 99 mg/dL    Comment: Glucose reference range applies only to samples taken after fasting for at least 8 hours.  CBC with Differential/Platelet     Status: Abnormal   Collection Time: 05/03/23  8:34 AM  Result Value Ref Range   WBC 8.5 4.0 - 10.5 K/uL   RBC  3.54 (L) 4.22 - 5.81 MIL/uL  Hemoglobin 12.0 (L) 13.0 - 17.0 g/dL   HCT 01.0 (L) 27.2 - 53.6 %   MCV 105.9 (H) 80.0 - 100.0 fL   MCH 33.9 26.0 - 34.0 pg   MCHC 32.0 30.0 - 36.0 g/dL   RDW 64.4 03.4 - 74.2 %   Platelets 201 150 - 400 K/uL   nRBC 0.0 0.0 - 0.2 %   Neutrophils Relative % 75 %   Neutro Abs 6.4 1.7 - 7.7 K/uL   Lymphocytes Relative 17 %   Lymphs Abs 1.5 0.7 - 4.0 K/uL   Monocytes Relative 6 %   Monocytes Absolute 0.5 0.1 - 1.0 K/uL   Eosinophils Relative 0 %   Eosinophils Absolute 0.0 0.0 - 0.5 K/uL   Basophils Relative 1 %   Basophils Absolute 0.0 0.0 - 0.1 K/uL   Immature Granulocytes 1 %   Abs Immature Granulocytes 0.04 0.00 - 0.07 K/uL    Comment: Performed at [facility], [address] [place], [place] [address]  Lactic acid, plasma     Status: None   Collection Time: 05/03/23  8:34 AM  Result Value Ref Range   Lactic Acid, Venous 1.8 0.5 - 1.9 mmol/L    Comment: Performed at [facility], [address] [place], [place] [address]   EEG adult  Result Date: 05/03/2023 Charlsie Quest, MD     05/03/2023  8:31 AM Patient Name: [name] MRN: [id number] Epilepsy Attending: Charlsie Quest Referring Physician/Provider: Leroy Sea, MD Date: 05/02/2023 Duration: 26.22 mins Patient history: 57yo M with ams getting eeg to evaluate for seizure Level of alertness: lethargic, sleep AEDs during EEG study: LCM Technical aspects: This EEG study was done with scalp electrodes positioned according to the 10-20 International system of electrode placement. Electrical activity was reviewed with band pass filter of 1-70Hz , sensitivity of 7 uV/mm, display speed of 75mm/sec with a 60Hz  notched filter applied as appropriate. EEG data were recorded continuously and digitally stored.  Video monitoring was available and reviewed as appropriate. Description: No posterior dominant rhythm was seen. Sleep was characterized by sleep spindles (12 to 14 Hz), maximal  frontocentral region.  EEG showed continuous generalized 3 to 6 Hz theta-delta slowing. Abundant generalized spike and wave complex were noted at 1-2hz . Hyperventilation and photic stimulation were not performed.   ABNORMALITY - Spike and wave,generalized - Continuous slow, generalized  IMPRESSION: This study is consistent with patient's known history of epilepsy with generalized onset.There is also moderate diffuse encephalopathy. No seizures were seen throughout the recording.  Priyanka Annabelle Harman   US Abdomen Limited RUQ (LIVER/GB)  Result Date: 05/02/2023 CLINICAL DATA:  Elevated liver enzymes. EXAM: ULTRASOUND ABDOMEN LIMITED RIGHT UPPER QUADRANT COMPARISON:  None Available. FINDINGS: Evaluation is limited due to overlying bowel gas. Gallbladder: No gallstones or wall thickening visualized. No sonographic Murphy sign noted by sonographer. Common bile duct: Diameter: 1 mm Liver: Mild increased liver echogenicity. Portal vein is patent on color Doppler imaging with normal direction of blood flow towards the liver. Other: None. IMPRESSION: Probable mild fatty liver, otherwise unremarkable right upper quadrant ultrasound. Electronically Signed   By: Elgie Collard M.D.   On: 05/02/2023 22:18   CT Head Wo Contrast  Result Date: 05/02/2023 CLINICAL DATA:  Altered mental status EXAM: CT HEAD WITHOUT CONTRAST TECHNIQUE: Contiguous axial images were obtained from the base of the skull through the vertex without intravenous contrast. RADIATION DOSE REDUCTION: This exam was performed according to the departmental dose-optimization  program which includes automated exposure control, adjustment of the mA and/or kV according to patient size and/or use of iterative reconstruction technique. COMPARISON:  03/03/2023 FINDINGS: Examination is limited by artifacts and motion. Brain: No evidence of acute infarction, hemorrhage, mass, mass effect, or midline shift. No hydrocephalus or extra-axial fluid collection.  Redemonstrated cerebral volume loss with ex vacuo dilatation ventricles, which appears similar to the prior exam. Vascular: No hyperdense vessel. Skull: Negative for fracture or focal lesion. Sinuses/Orbits: No acute finding. Other: The mastoid air cells are well aerated. Redemonstrated calcification in right nasopharynx. IMPRESSION: No acute intracranial process. Electronically Signed   By: Wiliam Ke M.D.   On: 05/02/2023 17:24   DG Abd Portable 1V  Result Date: 05/02/2023 CLINICAL DATA:  [id number] Constipated [id number] EXAM: PORTABLE ABDOMEN - 1 VIEW COMPARISON:  None Available. FINDINGS: The bowel gas pattern is non-obstructive. Physiological stool burden. No evidence of pneumoperitoneum, within the limitations of a supine film. Subacute/healing fractures of the anterolateral right eighth through tenth ribs noted. The soft tissues are within normal limits. Surgical changes, devices, tubes and lines: None. IMPRESSION: *Nonobstructive bowel gas pattern.  No constipation. *Subacute/healing fractures of the anterolateral right eighth through tenth ribs. Electronically Signed   By: Jules Schick M.D.   On: 05/02/2023 16:14   DG Chest Port 1 View  Result Date: 05/02/2023 CLINICAL DATA:  Weakness. EXAM: PORTABLE CHEST 1 VIEW COMPARISON:  03/03/2023. FINDINGS: Low lung volume. There are nonspecific increased interstitial markings throughout bilateral lungs. However, no frank pulmonary edema. No acute consolidation or lung collapse. Bilateral costophrenic angles are clear. No pneumothorax. Stable cardio-mediastinal silhouette. No acute osseous abnormalities. Distal right humerus metallic hardware noted. The soft tissues are within normal limits. IMPRESSION: *Increased interstitial markings throughout bilateral lungs, nonspecific. No acute consolidation or lung collapse. Electronically Signed   By: Jules Schick M.D.   On: 05/02/2023 16:10    Pending Labs Unresulted Labs (From admission, onward)     Start      Ordered   05/03/23 0500  C-reactive protein  Daily,   R     Question:  Specimen collection method  Answer:  IV Team=IV Team collect   05/02/23 1530   05/03/23 0500  Phosphorus  Daily,   R     Question:  Specimen collection method  Answer:  IV Team=IV Team collect   05/02/23 1530   05/03/23 0500  Procalcitonin  Daily,   R     References:    Procalcitonin Lower Respiratory Tract Infection AND Sepsis Procalcitonin Algorithm  Question:  Specimen collection method  Answer:  IV Team=IV Team collect   05/02/23 1530   05/03/23 0500  Magnesium  Daily,   R     Question:  Specimen collection method  Answer:  IV Team=IV Team collect   05/02/23 1530   05/03/23 0500  Comprehensive metabolic panel  Daily,   R     Question:  Specimen collection method  Answer:  IV Team=IV Team collect   05/02/23 1530   05/03/23 0500  CBC with Differential/Platelet  Daily,   R     Question:  Specimen collection method  Answer:  IV Team=IV Team collect   05/02/23 1530   05/03/23 0111  C-reactive protein  Once,   R        05/03/23 0111   05/03/23 0111  HIV Antibody (routine testing w rflx)  Once,   R        05/03/23 0111   05/02/23 1333  Culture,  blood (routine x 2)  BLOOD CULTURE X 2,   R (with STAT occurrences)      05/02/23 1333            Vitals/Pain Today's Vitals   05/03/23 0300 05/03/23 0400 05/03/23 0700 05/03/23 0728  BP: 117/84 (!) 141/121 (!) 141/71   Pulse: 60 (!) 54 (!) 58   Resp: 12 12 13    Temp:    97.8 F (36.6 C)  TempSrc:    Axillary  SpO2: 100% 100% 100%   Weight:      Height:        Isolation Precautions Droplet precaution  Medications Medications  dextrose 5 % solution ( Intravenous New Bag/Given 05/03/23 0227)  atorvastatin (LIPITOR) tablet 10 mg (10 mg Oral Not Given 05/03/23 0127)  bisacodyl (DULCOLAX) suppository 10 mg (has no administration in time range)  LORazepam (ATIVAN) injection 1 mg (has no administration in time range)  heparin injection 5,000 Units (5,000 Units  Subcutaneous Given 05/03/23 0809)  acetaminophen (TYLENOL) tablet 650 mg (has no administration in time range)    Or  acetaminophen (TYLENOL) suppository 650 mg (has no administration in time range)  ondansetron (ZOFRAN) tablet 4 mg (has no administration in time range)    Or  ondansetron (ZOFRAN) injection 4 mg (has no administration in time range)  cefTRIAXone (ROCEPHIN) 1 g in sodium chloride 0.9 % 100 mL IVPB (0 g Intravenous Stopped 05/02/23 1740)  azithromycin (ZITHROMAX) 500 mg in sodium chloride 0.9 % 250 mL IVPB (0 mg Intravenous Stopped 05/02/23 1945)  hydrALAZINE (APRESOLINE) injection 10 mg (has no administration in time range)  lacosamide (VIMPAT) 100 mg in sodium chloride 0.9 % 25 mL IVPB (has no administration in time range)    And  lacosamide (VIMPAT) 200 mg in sodium chloride 0.9 % 25 mL IVPB (has no administration in time range)  levothyroxine (SYNTHROID, LEVOTHROID) injection 66 mcg (66 mcg Intravenous Given 05/03/23 0132)  sodium chloride 0.9 % bolus 1,000 mL (0 mLs Intravenous Stopped 05/02/23 1435)  sodium chloride 0.9 % bolus 1,000 mL (0 mLs Intravenous Stopped 05/02/23 1520)    Mobility non-ambulatory, unsure of baseline mobility, not walking at this time      Focused Assessments Renal Assessment Handoff:  AKI   R Recommendations: See Admitting Provider Note  Report given to:   Additional Notes:

## 2023-05-03 NOTE — Procedures (Signed)
Patient Name: [name]  MRN: [id number]  Epilepsy Attending: Charlsie Quest  Referring Physician/Provider: Leroy Sea, MD  Date: 05/02/2023 Duration: 26.22 mins  Patient history: 57yo M with ams getting eeg to evaluate for seizure  Level of alertness: lethargic, sleep  AEDs during EEG study: LCM  Technical aspects: This EEG study was done with scalp electrodes positioned according to the 10-20 International system of electrode placement. Electrical activity was reviewed with band pass filter of 1-70Hz , sensitivity of 7 uV/mm, display speed of 61mm/sec with a 60Hz  notched filter applied as appropriate. EEG data were recorded continuously and digitally stored.  Video monitoring was available and reviewed as appropriate.  Description: No posterior dominant rhythm was seen. Sleep was characterized by sleep spindles (12 to 14 Hz), maximal frontocentral region.  EEG showed continuous generalized 3 to 6 Hz theta-delta slowing. Abundant generalized spike and wave complex were noted at 1-2hz . Hyperventilation and photic stimulation were not performed.     ABNORMALITY - Spike and wave,generalized - Continuous slow, generalized   IMPRESSION: This study is consistent with patient's known history of epilepsy with generalized onset.There is also moderate diffuse encephalopathy. No seizures were seen throughout the recording.   Jahliyah Trice Annabelle Harman

## 2023-05-03 NOTE — Progress Notes (Signed)
PROGRESS NOTE    [name]  VOZ:[id number] DOB: 1965/11/10 DOA: 05/02/2023 PCP: Lucretia Field    Brief Narrative:  57 year old with Down syndrome apparently nonverbal at baseline, recurrent falls, seizure, HTN, hypothyroidism lives in a group home admitted for AMS.  Family has noticed that over the last 2-3 weeks he is having poor oral intake and increasing drowsiness.  In the ER noted to have severe dehydration with AKI and mild leukocytosis.  Recently had similar presentation when he was started on Onfi for seizures and eventually with help with improvement in mentation.   Assessment & Plan:  Principal Problem:   AKI (acute kidney injury) (HCC) Active Problems:   Obstructive sleep apnea   Down syndrome   Seizure disorder (HCC)   Acute on chronic metabolic encephalopathy History of Down syndrome Adult failure to thrive -Unclear etiology at this time.  Recently had similar episode after he was started on Onfi for seizures but has been withheld since.  For now he is on Vimpat and IV as needed Ativan.  EEG ordered. -Check TSH, ammonia, vitamin D -CT head, RUQ, respiratory panel, cultures negative  SIRS Dehydration with AKI uremia, hypernatremia Hypocalcemia/hypomagnesemia - No obvious evidence of infection at this time.  Some chemo anemic instability could be from dehydration.  For now we will give IV fluids, clinically monitor. -Baseline creatinine 1.7, admission creatinine 2.4.  Creatinine improving but BUN still elevated.  Continue IV fluids -Replete electrolytes  Rhabdomyolysis Transaminitis - CK4 442, repeat tomorrow morning.  Continue IV fluids -Trend LFTs  Hypothyroidism - Synthroid  Essential hypertension -Holding home medication.  IV as needed   DVT prophylaxis: Subcu heparin Code Status: DNR Family Communication: Family is present at bedside Status is: Inpatient continue hospital stay for management of AKI, uremia    Subjective:  Seen and  examined at bedside. Patient's father and caregivers at bedside.  To them he seems slightly better compared to yesterday in terms of his movement.   Examination:  General exam: No meaningful interaction, opens his eyes, dry mouth Respiratory system: Clear to auscultation. Respiratory effort normal. Cardiovascular system: S1 & S2 heard, RRR. No JVD, murmurs, rubs, gallops or clicks. No pedal edema. Gastrointestinal system: Abdomen is nondistended, soft and nontender. No organomegaly or masses felt. Normal bowel sounds heard. Central nervous system: Unable to assess but opens eyes to his name Extremities: Contracted Skin: No rashes, lesions or ulcers Psychiatry: Unable to assess                Diet Orders (From admission, onward)     Start     Ordered   05/03/23 0842  DIET DYS 3 Room service appropriate? Yes; Fluid consistency: Thin  Diet effective now       Question Answer Comment  Room service appropriate? Yes   Fluid consistency: Thin      05/03/23 0842            Objective: Vitals:   05/03/23 0700 05/03/23 0728 05/03/23 1000 05/03/23 1101  BP: (!) 141/71  102/77 (!) 107/57  Pulse: (!) 58  (!) 43 60  Resp: 13  11 18   Temp:  97.8 F (36.6 C) 97.7 F (36.5 C) 97.8 F (36.6 C)  TempSrc:  Axillary Oral Oral  SpO2: 100%  100% 100%  Weight:      Height:        Intake/Output Summary (Last 24 hours) at 05/03/2023 1444 Last data filed at 05/03/2023 1001 Gross per 24 hour  Intake  3195.83 ml  Output --  Net 3195.83 ml   Filed Weights   05/02/23 1330  Weight: 74.8 kg    Scheduled Meds:  atorvastatin  10 mg Oral QHS   heparin  5,000 Units Subcutaneous Q8H   levothyroxine  66 mcg Intravenous Daily   Continuous Infusions:  azithromycin Stopped (05/02/23 1945)   cefTRIAXone (ROCEPHIN)  IV Stopped (05/02/23 1740)   dextrose 75 mL/hr at 05/03/23 1103   lacosamide (VIMPAT) IV 100 mg (05/03/23 1108)   And   lacosamide (VIMPAT) IV      Nutritional  status     Body mass index is 32.21 kg/m.  Data Reviewed:   CBC: Recent Labs  Lab 05/02/23 1333 05/02/23 1412 05/03/23 0834  WBC 11.7*  --  8.5  NEUTROABS 10.3*  --  6.4  HGB 12.0* 12.2* 12.0*  HCT 38.3* 36.0* 37.5*  MCV 108.5*  --  105.9*  PLT 220  --  201   Basic Metabolic Panel: Recent Labs  Lab 05/02/23 1333 05/02/23 1412 05/03/23 0102 05/03/23 0834  NA 147* 147*  --  138  K 4.4 4.3  --  4.0  CL 115* 115*  --  108  CO2 21*  --   --  22  GLUCOSE 105* 105*  --  113*  BUN 60* 56*  --  45*  CREATININE 2.45* 2.40* 1.73* 1.25*  CALCIUM 8.0*  --   --  7.7*  MG  --   --   --  1.8  PHOS  --   --   --  2.6   GFR: Estimated Creatinine Clearance: 56.6 mL/min (A) (by C-G formula based on SCr of 1.25 mg/dL (H)). Liver Function Tests: Recent Labs  Lab 05/02/23 1333 05/03/23 0834  AST 152* 266*  ALT 110* 226*  ALKPHOS 67 63  BILITOT 0.7 0.5  PROT 5.8* 5.3*  ALBUMIN 1.9* 1.8*   No results for input(s): "LIPASE", "AMYLASE" in the last 168 hours. Recent Labs  Lab 05/03/23 1104  AMMONIA <10   Coagulation Profile: Recent Labs  Lab 05/03/23 0102  INR 1.4*   Cardiac Enzymes: Recent Labs  Lab 05/03/23 0102  CKTOTAL 4,442*   BNP (last 3 results) No results for input(s): "PROBNP" in the last 8760 hours. HbA1C: No results for input(s): "HGBA1C" in the last 72 hours. CBG: Recent Labs  Lab 05/02/23 1400 05/03/23 0123  GLUCAP 93 134*   Lipid Profile: No results for input(s): "CHOL", "HDL", "LDLCALC", "TRIG", "CHOLHDL", "LDLDIRECT" in the last 72 hours. Thyroid Function Tests: Recent Labs    05/03/23 0101 05/03/23 0102  TSH 4.126  --   FREET4  --  1.17*   Anemia Panel: Recent Labs    05/03/23 0101 05/03/23 0823  VITAMINB12  --  577  FOLATE 9.9  --    Sepsis Labs: Recent Labs  Lab 05/02/23 1416 05/02/23 1546 05/03/23 0102 05/03/23 0834  PROCALCITON  --   --  0.54 0.45  LATICACIDVEN 4.5* 3.0*  --  1.8    Recent Results (from the past  240 hour(s))  Culture, blood (routine x 2)     Status: None (Preliminary result)   Collection Time: 05/02/23  1:57 PM   Specimen: BLOOD LEFT HAND  Result Value Ref Range Status   Specimen Description BLOOD LEFT HAND  Final   Special Requests   Final    BOTTLES DRAWN AEROBIC ONLY Blood Culture results may not be optimal due to an inadequate volume of blood received in culture  bottles   Culture   Final    NO GROWTH < 24 HOURS Performed at [facility], [address] [place], [place] [address]    Report Status PENDING  Incomplete  MRSA Next Gen by PCR, Nasal     Status: None   Collection Time: May 26, 2023  1:01 AM   Specimen: Nasal Mucosa; Nasal Swab  Result Value Ref Range Status   MRSA by PCR Next Gen NOT DETECTED NOT DETECTED Final    Comment: (NOTE) The GeneXpert MRSA Assay (FDA approved for NASAL specimens only), is one component of a comprehensive MRSA colonization surveillance program. It is not intended to diagnose MRSA infection nor to guide or monitor treatment for MRSA infections. Test performance is not FDA approved in patients less than 35 years old. Performed at [facility], [address], [place], [place] [address]   Respiratory (~20 pathogens) panel by PCR     Status: None   Collection Time: May 26, 2023  1:01 AM   Specimen: Nasal Mucosa; Respiratory  Result Value Ref Range Status   Adenovirus NOT DETECTED NOT DETECTED Final   Coronavirus 229E NOT DETECTED NOT DETECTED Final    Comment: (NOTE) The Coronavirus on the Respiratory Panel, DOES NOT test for the novel  Coronavirus (2019 nCoV)    Coronavirus HKU1 NOT DETECTED NOT DETECTED Final   Coronavirus NL63 NOT DETECTED NOT DETECTED Final   Coronavirus OC43 NOT DETECTED NOT DETECTED Final   Metapneumovirus NOT DETECTED NOT DETECTED Final   Rhinovirus / Enterovirus NOT DETECTED NOT DETECTED Final   Influenza A NOT DETECTED NOT DETECTED Final   Influenza B NOT DETECTED NOT DETECTED Final   Parainfluenza  Virus 1 NOT DETECTED NOT DETECTED Final   Parainfluenza Virus 2 NOT DETECTED NOT DETECTED Final   Parainfluenza Virus 3 NOT DETECTED NOT DETECTED Final   Parainfluenza Virus 4 NOT DETECTED NOT DETECTED Final   Respiratory Syncytial Virus NOT DETECTED NOT DETECTED Final   Bordetella pertussis NOT DETECTED NOT DETECTED Final   Bordetella Parapertussis NOT DETECTED NOT DETECTED Final   Chlamydophila pneumoniae NOT DETECTED NOT DETECTED Final   Mycoplasma pneumoniae NOT DETECTED NOT DETECTED Final    Comment: Performed at [facility], [address], [place], [place] [address]  SARS Coronavirus 2 by RT PCR (hospital order, performed in [facility] hospital lab) *cepheid single result test* Nasal Mucosa     Status: None   Collection Time: May 26, 2023  1:01 AM   Specimen: Nasal Mucosa; Nasal Swab  Result Value Ref Range Status   SARS Coronavirus 2 by RT PCR NEGATIVE NEGATIVE Final    Comment: Performed at [facility], [address] [place], [place] [address]         Radiology Studies: EEG adult  Result Date: 2023-05-26 Charlsie Quest, MD     26-May-2023  8:31 AM Patient Name: [name] MRN: [id number] Epilepsy Attending: Charlsie Quest Referring Physician/Provider: Leroy Sea, MD Date: 05/02/2023 Duration: 26.22 mins Patient history: 57yo M with ams getting eeg to evaluate for seizure Level of alertness: lethargic, sleep AEDs during EEG study: LCM Technical aspects: This EEG study was done with scalp electrodes positioned according to the 10-20 International system of electrode placement. Electrical activity was reviewed with band pass filter of 1-70Hz , sensitivity of 7 uV/mm, display speed of 55mm/sec with a 60Hz  notched filter applied as appropriate. EEG data were recorded continuously and digitally stored.  Video monitoring was available and reviewed  as appropriate. Description: No posterior dominant rhythm was seen. Sleep was characterized by sleep spindles (12  to 14 Hz), maximal frontocentral region.  EEG showed continuous generalized 3 to 6 Hz theta-delta slowing. Abundant generalized spike and wave complex were noted at 1-2hz . Hyperventilation and photic stimulation were not performed.   ABNORMALITY - Spike and wave,generalized - Continuous slow, generalized  IMPRESSION: This study is consistent with patient's known history of epilepsy with generalized onset.There is also moderate diffuse encephalopathy. No seizures were seen throughout the recording.  Priyanka Annabelle Harman   US Abdomen Limited RUQ (LIVER/GB)  Result Date: 05/02/2023 CLINICAL DATA:  Elevated liver enzymes. EXAM: ULTRASOUND ABDOMEN LIMITED RIGHT UPPER QUADRANT COMPARISON:  None Available. FINDINGS: Evaluation is limited due to overlying bowel gas. Gallbladder: No gallstones or wall thickening visualized. No sonographic Murphy sign noted by sonographer. Common bile duct: Diameter: 1 mm Liver: Mild increased liver echogenicity. Portal vein is patent on color Doppler imaging with normal direction of blood flow towards the liver. Other: None. IMPRESSION: Probable mild fatty liver, otherwise unremarkable right upper quadrant ultrasound. Electronically Signed   By: Elgie Collard M.D.   On: 05/02/2023 22:18   CT Head Wo Contrast  Result Date: 05/02/2023 CLINICAL DATA:  Altered mental status EXAM: CT HEAD WITHOUT CONTRAST TECHNIQUE: Contiguous axial images were obtained from the base of the skull through the vertex without intravenous contrast. RADIATION DOSE REDUCTION: This exam was performed according to the departmental dose-optimization program which includes automated exposure control, adjustment of the mA and/or kV according to patient size and/or use of iterative reconstruction technique. COMPARISON:  03/03/2023 FINDINGS: Examination is limited by artifacts and motion. Brain: No evidence of acute infarction, hemorrhage, mass, mass effect, or midline shift. No hydrocephalus or extra-axial fluid  collection. Redemonstrated cerebral volume loss with ex vacuo dilatation ventricles, which appears similar to the prior exam. Vascular: No hyperdense vessel. Skull: Negative for fracture or focal lesion. Sinuses/Orbits: No acute finding. Other: The mastoid air cells are well aerated. Redemonstrated calcification in right nasopharynx. IMPRESSION: No acute intracranial process. Electronically Signed   By: Wiliam Ke M.D.   On: 05/02/2023 17:24   DG Abd Portable 1V  Result Date: 05/02/2023 CLINICAL DATA:  [id number] Constipated [id number] EXAM: PORTABLE ABDOMEN - 1 VIEW COMPARISON:  None Available. FINDINGS: The bowel gas pattern is non-obstructive. Physiological stool burden. No evidence of pneumoperitoneum, within the limitations of a supine film. Subacute/healing fractures of the anterolateral right eighth through tenth ribs noted. The soft tissues are within normal limits. Surgical changes, devices, tubes and lines: None. IMPRESSION: *Nonobstructive bowel gas pattern.  No constipation. *Subacute/healing fractures of the anterolateral right eighth through tenth ribs. Electronically Signed   By: Jules Schick M.D.   On: 05/02/2023 16:14   DG Chest Port 1 View  Result Date: 05/02/2023 CLINICAL DATA:  Weakness. EXAM: PORTABLE CHEST 1 VIEW COMPARISON:  03/03/2023. FINDINGS: Low lung volume. There are nonspecific increased interstitial markings throughout bilateral lungs. However, no frank pulmonary edema. No acute consolidation or lung collapse. Bilateral costophrenic angles are clear. No pneumothorax. Stable cardio-mediastinal silhouette. No acute osseous abnormalities. Distal right humerus metallic hardware noted. The soft tissues are within normal limits. IMPRESSION: *Increased interstitial markings throughout bilateral lungs, nonspecific. No acute consolidation or lung collapse. Electronically Signed   By: Jules Schick M.D.   On: 05/02/2023 16:10           LOS: 1 day   Time spent= 35  mins    Miguel Rota, MD [facility]  If 7PM-7AM, please contact night-coverage  05/03/2023, 2:44 PM

## 2023-05-03 NOTE — TOC CM/SW Note (Signed)
Transition of Care [facility]) - Inpatient Brief Assessment   Patient Details  Name: [name] MRN: [id number] Date of Birth: [birth year]  Transition of [facility]) CM/SW Contact:    Mearl Latin, LCSW Phone Number: 05/03/2023, 2:22 PM   Clinical Narrative: Patient admitted from [facility] [id number]) with medical history significant of Down's syndrome, seizure d/o. Patient is normally non-verbal and non-ambulatory. TOC following for discharge needs.    Transition of Care Asessment: Insurance and Status: Insurance coverage has been reviewed Patient has primary care physician: Yes Home environment has been reviewed: From group home Prior level of function:: WC Prior/Current Home Services: Current home services Social Determinants of Health Reivew: SDOH reviewed no interventions necessary Readmission risk has been reviewed: Yes Transition of care needs: transition of care needs identified, TOC will continue to follow

## 2023-05-03 NOTE — Progress Notes (Signed)
PT Cancellation Note  Patient Details Name: ARLIND ROGAN MRN: [id number] DOB: [birth year]   Cancelled Treatment:    Reason Eval/Treat Not Completed: Fatigue/lethargy limiting ability to participate;Patient's level of consciousness (Pt noted to be very lethargic today unresponsive to verbal, tactile, and painful stimuli. Per chart review, pt is nonverbal at baseline however no family present to determine baseline mobility. Will follow up if time allows.)   Gladys Damme 05/03/2023, 9:59 AM

## 2023-05-03 NOTE — ED Notes (Signed)
Patient had 1 small loose bowel movement, and wet diapers. Unable to collect urine specimen at this time. Patient was able to assist with some turning to change bed chux and clean him. Patient with new diapers in place.

## 2023-05-04 DIAGNOSIS — R569 Unspecified convulsions: Secondary | ICD-10-CM | POA: Diagnosis not present

## 2023-05-04 DIAGNOSIS — N179 Acute kidney failure, unspecified: Secondary | ICD-10-CM | POA: Diagnosis not present

## 2023-05-04 LAB — BLOOD CULTURE ID PANEL (REFLEXED) - BCID2

## 2023-05-04 LAB — COMPREHENSIVE METABOLIC PANEL
ALT: 197 U/L — ABNORMAL HIGH (ref 0–44)
AST: 169 U/L — ABNORMAL HIGH (ref 15–41)
Albumin: <1.5 g/dL — ABNORMAL LOW (ref 3.5–5.0)
Alkaline Phosphatase: 53 U/L (ref 38–126)
Anion gap: 10 (ref 5–15)
BUN: 31 mg/dL — ABNORMAL HIGH (ref 6–20)
CO2: 21 mmol/L — ABNORMAL LOW (ref 22–32)
Calcium: 7.7 mg/dL — ABNORMAL LOW (ref 8.9–10.3)
Chloride: 105 mmol/L (ref 98–111)
Creatinine, Ser: 0.95 mg/dL (ref 0.61–1.24)
GFR, Estimated: >60 mL/min (ref >60)
Glucose, Bld: 107 mg/dL — ABNORMAL HIGH (ref 70–99)
Potassium: 3.9 mmol/L (ref 3.5–5.1)
Sodium: 136 mmol/L (ref 135–145)
Total Bilirubin: 0.8 mg/dL (ref <1.2)
Total Protein: 4.5 g/dL — ABNORMAL LOW (ref 6.5–8.1)

## 2023-05-04 LAB — MAGNESIUM: Magnesium: 1.6 mg/dL — ABNORMAL LOW (ref 1.7–2.4)

## 2023-05-04 LAB — CBC WITH DIFFERENTIAL/PLATELET
Abs Immature Granulocytes: 0.04 10*3/uL (ref 0.00–0.07)
Basophils Absolute: 0 10*3/uL (ref 0.0–0.1)
Basophils Relative: 1 %
Eosinophils Absolute: 0.1 10*3/uL (ref 0.0–0.5)
Eosinophils Relative: 2 %
HCT: 30.1 % — ABNORMAL LOW (ref 39.0–52.0)
Hemoglobin: 10.3 g/dL — ABNORMAL LOW (ref 13.0–17.0)
Immature Granulocytes: 1 %
Lymphocytes Relative: 24 %
Lymphs Abs: 1.4 10*3/uL (ref 0.7–4.0)
MCH: 34.1 pg — ABNORMAL HIGH (ref 26.0–34.0)
MCHC: 34.2 g/dL (ref 30.0–36.0)
MCV: 99.7 fL (ref 80.0–100.0)
Monocytes Absolute: 0.5 10*3/uL (ref 0.1–1.0)
Monocytes Relative: 8 %
Neutro Abs: 4 10*3/uL (ref 1.7–7.7)
Neutrophils Relative %: 64 %
Platelets: 199 10*3/uL (ref 150–400)
RBC: 3.02 MIL/uL — ABNORMAL LOW (ref 4.22–5.81)
RDW: 12.8 % (ref 11.5–15.5)
WBC: 6 10*3/uL (ref 4.0–10.5)
nRBC: 0 % (ref 0.0–0.2)

## 2023-05-04 LAB — PROCALCITONIN: Procalcitonin: 0.28 ng/mL

## 2023-05-04 LAB — C-REACTIVE PROTEIN: CRP: 6 mg/dL — ABNORMAL HIGH (ref <1.0)

## 2023-05-04 LAB — PHOSPHORUS: Phosphorus: 2.2 mg/dL — ABNORMAL LOW (ref 2.5–4.6)

## 2023-05-04 LAB — CK: Total CK: 1455 U/L — ABNORMAL HIGH (ref 49–397)

## 2023-05-04 MED ORDER — PANTOPRAZOLE SODIUM 40 MG PO TBEC
40.0000 mg | DELAYED_RELEASE_TABLET | Freq: Every day | ORAL | Status: DC
  Administered 2023-05-04 – 2023-05-09 (×6): 40 mg via ORAL
  Filled 2023-05-04 (×7): qty 1

## 2023-05-04 MED ORDER — POTASSIUM PHOSPHATES 15 MMOLE/5ML IV SOLN
30.0000 mmol | Freq: Once | INTRAVENOUS | Status: AC
  Administered 2023-05-04: 30 mmol via INTRAVENOUS
  Filled 2023-05-04: qty 10

## 2023-05-04 MED ORDER — MAGNESIUM SULFATE 4 GM/100ML IV SOLN
4.0000 g | Freq: Once | INTRAVENOUS | Status: AC
  Administered 2023-05-04: 4 g via INTRAVENOUS
  Filled 2023-05-04: qty 100

## 2023-05-04 MED ORDER — LACTATED RINGERS IV SOLN: INTRAVENOUS | Status: AC

## 2023-05-04 NOTE — Progress Notes (Signed)
Pharmacy notified and reported that BCID blood culture positive 1 of 3 for staph epi. Likely contaminant but pt is already on ceftriaxone and azithromycin - no changes needed at this time    Tereasa Coop, MD [facility] 05/04/2023, 10:54 PM

## 2023-05-04 NOTE — Progress Notes (Signed)
PHARMACY - PHYSICIAN COMMUNICATION CRITICAL VALUE ALERT - BLOOD CULTURE IDENTIFICATION (BCID)  [name] is an 57 y.o. male who presented to [facility] on [facility]/02/2023 with a chief complaint of altered mental status.   Assessment:  1/3 Staphylococcus epidermidis w/ resistance (likely contaminant)  Name of physician (or Provider) Contacted: Sundil  Current antibiotics: ceftriaxone and azithromycin  Changes to prescribed antibiotics recommended:  Patient is on recommended antibiotics - No changes needed  Results for orders placed or performed during the hospital encounter of 05/02/23  Blood Culture ID Panel (Reflexed) (Collected: 05/03/2023  8:34 AM)  Result Value Ref Range   Enterococcus faecalis NOT DETECTED NOT DETECTED   Enterococcus Faecium NOT DETECTED NOT DETECTED   Listeria monocytogenes NOT DETECTED NOT DETECTED   Staphylococcus species DETECTED (A) NOT DETECTED   Staphylococcus aureus (BCID) NOT DETECTED NOT DETECTED   Staphylococcus epidermidis DETECTED (A) NOT DETECTED   Staphylococcus lugdunensis NOT DETECTED NOT DETECTED   Streptococcus species NOT DETECTED NOT DETECTED   Streptococcus agalactiae NOT DETECTED NOT DETECTED   Streptococcus pneumoniae NOT DETECTED NOT DETECTED   Streptococcus pyogenes NOT DETECTED NOT DETECTED   A.calcoaceticus-baumannii NOT DETECTED NOT DETECTED   Bacteroides fragilis NOT DETECTED NOT DETECTED   Enterobacterales NOT DETECTED NOT DETECTED   Enterobacter cloacae complex NOT DETECTED NOT DETECTED   Escherichia coli NOT DETECTED NOT DETECTED   Klebsiella aerogenes NOT DETECTED NOT DETECTED   Klebsiella oxytoca NOT DETECTED NOT DETECTED   Klebsiella pneumoniae NOT DETECTED NOT DETECTED   Proteus species NOT DETECTED NOT DETECTED   Salmonella species NOT DETECTED NOT DETECTED   Serratia marcescens NOT DETECTED NOT DETECTED   Haemophilus influenzae NOT DETECTED NOT DETECTED   Neisseria meningitidis NOT DETECTED NOT DETECTED    Pseudomonas aeruginosa NOT DETECTED NOT DETECTED   Stenotrophomonas maltophilia NOT DETECTED NOT DETECTED   Candida albicans NOT DETECTED NOT DETECTED   Candida auris NOT DETECTED NOT DETECTED   Candida glabrata NOT DETECTED NOT DETECTED   Candida krusei NOT DETECTED NOT DETECTED   Candida parapsilosis NOT DETECTED NOT DETECTED   Candida tropicalis NOT DETECTED NOT DETECTED   Cryptococcus neoformans/gattii NOT DETECTED NOT DETECTED   Methicillin resistance mecA/C DETECTED (A) NOT DETECTED    [name] [name] 05/04/2023  10:22 PM

## 2023-05-04 NOTE — Consult Note (Signed)
Neurology Consultation Reason for Consult: seizure Referring Physician: Dr Susa Raring  CC: ams  History is obtained from: Family at bedside, chart review  HPI: [name] is a 57 y.o. male with past medical history of Down syndrome, epilepsy currently controlled on Vimpat who was brought in on 05/02/2023 due to worsening altered mental status.  Suspected due to UTI as well as Onfi which has been held.  Neurology was consulted today due to history of epilepsy and persistent altered mental status, not back to baseline  Epilepsy history: Per chart review, patient has had epilepsy for several decades.  It was controlled on Depakote.  However due to hyperammonemia, Depakote dosing was reduced and Keppra was added.  However in 2019.,  Patient continued to have hyperammonemia and irritability and therefore was switched to Vimpat.  However, since 2023 he has had episodes of jerking associated with fall which have not improved with increasing doses of Vimpat and currently unsure if they are epileptic or not.  Due to these episodes, in August 2024 Onfi was added.  Unfortunately, patient was seen in October 2024 with worsening mentation and therefore Onfi was discontinued.  Currently patient is on Vimpat 100 every morning and 200 mg nightly  ROS: All other systems reviewed and negative except as noted in the HPI.   Past Medical History:  Diagnosis Date   Down's syndrome    GERD (gastroesophageal reflux disease)    Hypertension    Hypothyroid    Lives in group home    [facility]-[phone]-fax-[phone]-Roxanne-nurse   Nystagmus    Obstructive sleep apnea (adult) (pediatric)    does use a cpap at night   Seizure disorder (HCC) 10/28/2016   Seizures (HCC)     Family History  Problem Relation Age of Onset   Dementia Maternal Grandmother    Diabetes Paternal Grandfather    Heart failure Paternal Grandfather    Hypertension Paternal Grandfather    Seizures Neg Hx     Social  History:  reports that he has never smoked. He has never used smokeless tobacco. He reports that he does not drink alcohol and does not use drugs.  Medications Prior to Admission  Medication Sig Dispense Refill Last Dose   acetaminophen (TYLENOL) 160 MG/5ML solution Take 20 mLs by mouth every 4 (four) hours as needed for fever or moderate pain (pain score 4-6).   unknown   Alum & Mag Hydroxide-Simeth (ANTACID LIQUID PO) Take 15 mLs by mouth See admin instructions. Take 15 ml by mouth every 2 hours as needed if less than 100 lbs, take 30 ml by mouth every 2 hours if greater than 100 lbs for indigestion/heartburn/upset stomach.   unknown   Artificial Tear Ointment (LACRI-LUBE OP) Place 1 application into both eyes at bedtime.   05/02/2023   atorvastatin (LIPITOR) 10 MG tablet Take 10 mg by mouth at bedtime.   05/01/2023   benazepril (LOTENSIN) 10 MG tablet Take 10 mg by mouth at bedtime.   05/01/2023   calcitonin, salmon, (MIACALCIN/FORTICAL) 200 UNIT/ACT nasal spray Place 1 spray into alternate nostrils daily.   05/02/2023   Cholecalciferol (VITAMIN D3) 50 MCG (2000 UT) TABS Take 2,000 Units by mouth every morning.   05/02/2023   diphenhydrAMINE (BENADRYL) 12.5 MG/5ML elixir Take 10 mLs by mouth every 4 (four) hours as needed for allergies or itching.   unknown   doxycycline (VIBRAMYCIN) 50 MG capsule Take 50 mg by mouth daily.   05/02/2023   Emollient (CERAVE DAILY MOISTURIZING) LOTN  Apply 1 Application topically as needed (dry skin).   unknown   guaiFENesin (ROBITUSSIN) 100 MG/5ML liquid Take 5 mLs by mouth every 4 (four) hours as needed for cough or to loosen phlegm.   unknown   Infant Care Products (JOHNSONS BABY SHAMPOO) SHAM Apply 1 application  topically See admin instructions. Mix 1/2 water and 1/2 shampoo - use to scrub eyebrows and eyelids every morning   unknown   Lacosamide 100 MG TABS Take 100-200 mg by mouth See admin instructions. Take 1 tablet by mouth every morning. take 2 tablets by mouth  every evening.   05/02/2023   lactulose (CHRONULAC) 10 GM/15ML solution Take 10 g by mouth every morning.   05/02/2023   levothyroxine (SYNTHROID) 88 MCG tablet Take 88 mcg by mouth every morning.   05/02/2023   loperamide (IMODIUM A-D) 2 MG tablet Take 4 mg by mouth as needed for diarrhea or loose stools.   unknown   magnesium hydroxide (MILK OF MAGNESIA) 400 MG/5ML suspension Take 30 mLs by mouth once as needed for mild constipation. For Constipation Step 2: If no results from Step 1, take 30 ml by mouth for 1 dose if greater than 100 lbs; give 15 ml by mouth if less than 100 lbs.   unknown   mupirocin ointment (BACTROBAN) 2 % Apply 1 Application topically once as needed.   unknown   Omega-3 Fatty Acids (FISH OIL) 500 MG CAPS Take 1,500 mg by mouth every morning.   05/02/2023   promethazine (PHENERGAN) 25 MG tablet Take 25 mg by mouth every 4 (four) hours as needed for nausea or vomiting.   unknown   Soap & Cleansers (PHISODERM EX) Apply 1 application topically 2 (two) times daily. Apply to face   unknown   Vitamins A & D (VITAMIN A & D) ointment Apply 1 Application topically 3 (three) times daily.   unknown   cloBAZam (ONFI) 10 MG tablet Take 0.5 tablets (5 mg total) by mouth at bedtime. (Patient not taking: Reported on 05/02/2023) 15 tablet 5 Not Taking      Exam: Current vital signs: BP (!) 94/57 (BP Location: Right Wrist)   Pulse 61   Temp 97.6 F (36.4 C) (Axillary)   Resp 12   Ht 5' (1.524 m)   Wt 74.8 kg   SpO2 100%   BMI 32.21 kg/m  Vital signs in last 24 hours: Temp:  [97.6 F (36.4 C)-98.4 F (36.9 C)] 97.6 F (36.4 C) (12/04 0800) Pulse Rate:  [54-68] 61 (12/04 0800) Resp:  [10-17] 12 (12/04 0800) BP: (94-112)/(57-71) 94/57 (12/04 0800) SpO2:  [100 %] 100 % (12/04 0800)   Physical Exam  Constitutional: Appears well-developed and well-nourished.  Neuro: Opens eyes to verbal and tactile stimulation, nonverbal at baseline, did not follow commands, pupils equally round and  reactive, able to track examiner in room, spontaneously moving all 4 extremities with antigravity strength   I have reviewed labs in epic and the results pertinent to this consultation are: CBC:  Recent Labs  Lab 05/03/23 0834 05/04/23 0458  WBC 8.5 6.0  NEUTROABS 6.4 4.0  HGB 12.0* 10.3*  HCT 37.5* 30.1*  MCV 105.9* 99.7  PLT 201 199    Basic Metabolic Panel:  Lab Results  Component Value Date   NA 136 05/04/2023   K 3.9 05/04/2023   CO2 21 (L) 05/04/2023   GLUCOSE 107 (H) 05/04/2023   BUN 31 (H) 05/04/2023   CREATININE 0.95 05/04/2023   CALCIUM 7.7 (L) 05/04/2023  GFRNONAA >60 05/04/2023   GFRAA >60 02/23/2018   Lipid Panel: No results found for: "LDLCALC" HgbA1c: No results found for: "HGBA1C" Urine Drug Screen:     Component Value Date/Time   LABOPIA NONE DETECTED 05/02/2023 1730   COCAINSCRNUR NONE DETECTED 05/02/2023 1730   LABBENZ NONE DETECTED 05/02/2023 1730   AMPHETMU NONE DETECTED 05/02/2023 1730   THCU NONE DETECTED 05/02/2023 1730   LABBARB NONE DETECTED 05/02/2023 1730    Alcohol Level     Component Value Date/Time   ETH <10 03/03/2023 1114     I have reviewed the images obtained:  CT head without contrast 05/02/2023:No acute intracranial process.   ASSESSMENT/PLAN: 57 year old male with history of epilepsy who presented with altered mental status, currently on antibiotics for mild  UTI  Epilepsy Acute infectious encephalopathy UTI Rhabdomyolysis -Routine EEG did not show any seizures -Mental status appears to be improving.  Therefore would recommend continuing current dose of Vimpat 100 mg every morning and 200 mg nightly -If patient continues to have episodes of jerking, will consider video EEG for further characterization -I was able to call and speak with patient's father who states he has read he witnessed any charts and per him there appear more like tremors rather than seizures.  I recommended taking a video if he sees any  episodes -As Onfi has been excessively sedating, can consider adding low-dose Klonopin if the jerks persist -Continue seizure precautions  Thank you for allowing [place] to participate in the care of this patient. If you have any further questions, please contact  me or neurohospitalist.   Lindie Spruce Epile[facility]

## 2023-05-04 NOTE — Plan of Care (Signed)

## 2023-05-04 NOTE — Progress Notes (Signed)
PROGRESS NOTE                                                                                                                                                                                                             Patient Demographics:    [name], is a 57 y.o. male, DOB - 03-16-1966, XBM:[id number]  Outpatient Primary MD for the patient is [name], [name]    LOS - 2  Admit date - 05/02/2023    Chief Complaint  Patient presents with   Altered Mental Status       Brief Narrative (HPI from H&P)    57 y.o. male,  Down syndrome, apparently nonverbal at baseline, recurrent falls, seizures, hypothyroidism, hypertension who lives in a group home comes into the hospital with altered mental status.  Patient is brought in by his father, apparently patient at baseline is mildly drowsy and usually is alert 10 to 12 hours a day however for the last 2 to 3 weeks he has been increasingly more drowsy not eating or drinking well, finally they brought him to the ER today where he was diagnosed with mild leukocytosis, severe dehydration and AKI.  Possible early sepsis could not be ruled out although he is not febrile, chest x-ray and abdominal x-ray are stable. Patient himself is extremely somnolent and unable to provide any history.    Subjective:    [name] today in bed appears comfortable, nonverbal at baseline, however more awake than  2 days ago   Assessment  & Plan :    1.  Gradually progressive metabolic encephalopathy on top of chronic baseline encephalopathy due to underlying Down syndrome, causing decreased oral intake over the last 2 to 3 weeks, dehydration and AKI.  Clinically sepsis has been ruled out.   Patient had similar presentation according to father few weeks ago secondary to [facility], ever we have confirmed that he was not taking on feet lately, EEG does not show any acute seizures, CT head  unremarkable, with hydration mental status is improving of note he is nonverbal at baseline.  Will request neurology to evaluate as well.  Currently on Vimpat for his underlying seizures.     2.  Mild UTI.  Chest x-ray and KUB stable, borderline, finished 5 days of antibiotics, no sepsis clinically.,   3.  Severe dehydration and  AKI due to poor oral intake over the last 2 to 3 weeks.  Hydrate, continue IV fluids another day.  Hold ACE inhibitor.   4.  Hypothyroidism.  Home dose Synthroid.  Stable TSH.   5.  Hypertension.  Hold ACE inhibitor due to AKI as needed IV hydralazine.   6.  Mild rhabdo and asymptomatic transaminitis.  Improved with IV fluids.  7.  Dyslipidemia.  On statin.  8.  Hypomagnesemia, hypophosphatemia.  Replaced.       Condition - Extremely Guarded  Family Communication  : Father and mother at the time of admission.  Long discussion with patient's mother who is power of attorney and father, treat with IV antibiotics, IV fluids and medications, if he continues to decline then DNR, no major heroics.    Code Status : DNR  Consults  : Neurology  PUD Prophylaxis : PPI   Procedures  :     Right upper quadrant ultrasound.  Possible mild fatty liver.    CT head.  Nonacute.    EEG This study is consistent with patient's known history of epilepsy with generalized onset.There is also moderate diffuse encephalopathy. No seizures were seen throughout the recording       Disposition Plan  :    Status is: Inpatient   DVT Prophylaxis  :    heparin injection 5,000 Units Start: 05/02/23 1600   Lab Results  Component Value Date   PLT 199 05/04/2023    Diet :  Diet Order             DIET DYS 3 Room service appropriate? Yes; Fluid consistency: Thin  Diet effective now                    Inpatient Medications  Scheduled Meds:  atorvastatin  10 mg Oral QHS   heparin  5,000 Units Subcutaneous Q8H   levothyroxine  66 mcg Intravenous Daily    Continuous Infusions:  azithromycin Stopped (05/03/23 1806)   cefTRIAXone (ROCEPHIN)  IV Stopped (05/03/23 1715)   lacosamide (VIMPAT) IV Stopped (05/03/23 1144)   And   lacosamide (VIMPAT) IV Stopped (05/03/23 2251)   lactated ringers     magnesium sulfate bolus IVPB     potassium PHOSPHATE IVPB (in mmol)     PRN Meds:.acetaminophen **OR** acetaminophen, bisacodyl, hydrALAZINE, ipratropium-albuterol, LORazepam, metoprolol tartrate, [DISCONTINUED] ondansetron **OR** ondansetron (ZOFRAN) IV, senna-docusate, traZODone  Antibiotics  :    Anti-infectives (From admission, onward)    Start     Dose/Rate Route Frequency Ordered Stop   05/02/23 1600  cefTRIAXone (ROCEPHIN) 1 g in sodium chloride 0.9 % 100 mL IVPB        1 g 200 mL/hr over 30 Minutes Intravenous Every 24 hours 05/02/23 1533 05/07/23 1559   05/02/23 1600  azithromycin (ZITHROMAX) 500 mg in sodium chloride 0.9 % 250 mL IVPB        500 mg 250 mL/hr over 60 Minutes Intravenous Every 24 hours 05/02/23 1533 05/07/23 1559         Objective:   Vitals:   05/03/23 2058 05/04/23 0033 05/04/23 0357 05/04/23 0800  BP: 112/71 111/66 94/64 (!) 94/57  Pulse: 68 68 60 (!) 56  Resp: 17 17 17 12   Temp: 97.6 F (36.4 C) 97.8 F (36.6 C) 98.4 F (36.9 C) 97.6 F (36.4 C)  TempSrc: Oral Oral Axillary Axillary  SpO2: 100%  100% 100%  Weight:      Height:  Wt Readings from Last 3 Encounters:  05/02/23 74.8 kg  09/24/22 74.8 kg  07/01/22 77.1 kg     Intake/Output Summary (Last 24 hours) at 05/04/2023 0913 Last data filed at 05/04/2023 0859 Gross per 24 hour  Intake 3228.83 ml  Output 600 ml  Net 2628.83 ml     Physical Exam  Awake but nonverbal at baseline, more awake than he was in the ER 2 days ago, will nod his head to few questions [place].AT,PERRAL Supple Neck, No JVD,   Symmetrical Chest wall movement, Good air movement bilaterally, CTAB RRR,No Gallops,Rubs or new Murmurs,  +ve B.Sounds, Abd Soft, No  tenderness,   No Cyanosis, Clubbing or edema       Data Review:    Recent Labs  Lab 05/02/23 1333 05/02/23 1412 05/03/23 0834 05/04/23 0458  WBC 11.7*  --  8.5 6.0  HGB 12.0* 12.2* 12.0* 10.3*  HCT 38.3* 36.0* 37.5* 30.1*  PLT 220  --  201 199  MCV 108.5*  --  105.9* 99.7  MCH 34.0  --  33.9 34.1*  MCHC 31.3  --  32.0 34.2  RDW 13.4  --  13.2 12.8  LYMPHSABS 0.7  --  1.5 1.4  MONOABS 0.6  --  0.5 0.5  EOSABS 0.0  --  0.0 0.1  BASOSABS 0.0  --  0.0 0.0    Recent Labs  Lab 05/02/23 1333 05/02/23 1412 05/02/23 1416 05/02/23 1524 05/02/23 1546 05/03/23 0101 05/03/23 0102 05/03/23 0834 05/03/23 1104 05/04/23 0458  NA 147* 147*  --   --   --   --   --  138  --  136  K 4.4 4.3  --   --   --   --   --  4.0  --  3.9  CL 115* 115*  --   --   --   --   --  108  --  105  CO2 21*  --   --   --   --   --   --  22  --  21*  ANIONGAP 11  --   --   --   --   --   --  8  --  10  GLUCOSE 105* 105*  --   --   --   --   --  113*  --  107*  BUN 60* 56*  --   --   --   --   --  45*  --  31*  CREATININE 2.45* 2.40*  --   --   --   --  1.73* 1.25*  --  0.95  AST 152*  --   --   --   --   --   --  266*  --  169*  ALT 110*  --   --   --   --   --   --  226*  --  197*  ALKPHOS 67  --   --   --   --   --   --  63  --  53  BILITOT 0.7  --   --   --   --   --   --  0.5  --  0.8  ALBUMIN 1.9*  --   --   --   --   --   --  1.8*  --  <1.5*  CRP  --   --   --   --   --   --   --  9.2*  --  6.0*  PROCALCITON  --   --   --   --   --   --  0.54 0.45  --  0.28  LATICACIDVEN  --   --  4.5*  --  3.0*  --   --  1.8  --   --   INR  --   --   --   --   --   --  1.4*  --   --   --   TSH  --   --   --  6.481*  --  4.126  --   --   --   --   AMMONIA  --   --   --   --   --   --   --   --  <10  --   MG  --   --   --   --   --   --   --  1.8  --  1.6*  CALCIUM 8.0*  --   --   --   --   --   --  7.7*  --  7.7*      Recent Labs  Lab 05/02/23 1333 05/02/23 1416 05/02/23 1524 05/02/23 1546  05/03/23 0101 05/03/23 0102 05/03/23 0834 05/03/23 1104 05/04/23 0458  CRP  --   --   --   --   --   --  9.2*  --  6.0*  PROCALCITON  --   --   --   --   --  0.54 0.45  --  0.28  LATICACIDVEN  --  4.5*  --  3.0*  --   --  1.8  --   --   INR  --   --   --   --   --  1.4*  --   --   --   TSH  --   --  6.481*  --  4.126  --   --   --   --   AMMONIA  --   --   --   --   --   --   --  <10  --   MG  --   --   --   --   --   --  1.8  --  1.6*  CALCIUM 8.0*  --   --   --   --   --  7.7*  --  7.7*    --------------------------------------------------------------------------------------------------------------- No results found for: "CHOL", "HDL", "LDLCALC", "LDLDIRECT", "TRIG", "CHOLHDL"  No results found for: "HGBA1C" Recent Labs    05/03/23 0101 05/03/23 0102  TSH 4.126  --   FREET4  --  1.17*   Recent Labs    05/03/23 0101 05/03/23 0823  VITAMINB12  --  577  FOLATE 9.9  --    ------------------------------------------------------------------------------------------------------------------ Cardiac Enzymes No results for input(s): "CKMB", "TROPONINI", "MYOGLOBIN" in the last 168 hours.  Invalid input(s): "CK"  Micro Results Recent Results (from the past 240 hour(s))  Culture, blood (routine x 2)     Status: None (Preliminary result)   Collection Time: 05/02/23  1:57 PM   Specimen: BLOOD LEFT HAND  Result Value Ref Range Status   Specimen Description BLOOD LEFT HAND  Final   Special Requests   Final    BOTTLES DRAWN AEROBIC ONLY Blood Culture results may not be optimal due to an inadequate volume of blood received in culture bottles   Culture  Final    NO GROWTH 2 DAYS Performed at [facility], [address], [place], [place] [address]    Report Status PENDING  Incomplete  MRSA Next Gen by PCR, Nasal     Status: None   Collection Time: 05/03/23  1:01 AM   Specimen: Nasal Mucosa; Nasal Swab  Result Value Ref Range Status   MRSA by PCR Next Gen NOT DETECTED NOT  DETECTED Final    Comment: (NOTE) The GeneXpert MRSA Assay (FDA approved for NASAL specimens only), is one component of a comprehensive MRSA colonization surveillance program. It is not intended to diagnose MRSA infection nor to guide or monitor treatment for MRSA infections. Test performance is not FDA approved in patients less than 32 years old. Performed at [facility], [address], [place], [place] [address]   Respiratory (~20 pathogens) panel by PCR     Status: None   Collection Time: 05/03/23  1:01 AM   Specimen: Nasal Mucosa; Respiratory  Result Value Ref Range Status   Adenovirus NOT DETECTED NOT DETECTED Final   Coronavirus 229E NOT DETECTED NOT DETECTED Final    Comment: (NOTE) The Coronavirus on the Respiratory Panel, DOES NOT test for the novel  Coronavirus (2019 nCoV)    Coronavirus HKU1 NOT DETECTED NOT DETECTED Final   Coronavirus NL63 NOT DETECTED NOT DETECTED Final   Coronavirus OC43 NOT DETECTED NOT DETECTED Final   Metapneumovirus NOT DETECTED NOT DETECTED Final   Rhinovirus / Enterovirus NOT DETECTED NOT DETECTED Final   Influenza A NOT DETECTED NOT DETECTED Final   Influenza B NOT DETECTED NOT DETECTED Final   Parainfluenza Virus 1 NOT DETECTED NOT DETECTED Final   Parainfluenza Virus 2 NOT DETECTED NOT DETECTED Final   Parainfluenza Virus 3 NOT DETECTED NOT DETECTED Final   Parainfluenza Virus 4 NOT DETECTED NOT DETECTED Final   Respiratory Syncytial Virus NOT DETECTED NOT DETECTED Final   Bordetella pertussis NOT DETECTED NOT DETECTED Final   Bordetella Parapertussis NOT DETECTED NOT DETECTED Final   Chlamydophila pneumoniae NOT DETECTED NOT DETECTED Final   Mycoplasma pneumoniae NOT DETECTED NOT DETECTED Final    Comment: Performed at [facility], [address] [place], [place] [address]  SARS Coronavirus 2 by RT PCR (hospital order, performed in [facility] hospital lab) *cepheid single result test* Nasal Mucosa     Status: None    Collection Time: 05/03/23  1:01 AM   Specimen: Nasal Mucosa; Nasal Swab  Result Value Ref Range Status   SARS Coronavirus 2 by RT PCR NEGATIVE NEGATIVE Final    Comment: Performed at [facility], [address] [name], [place] [address]  Culture, blood (routine x 2)     Status: None (Preliminary result)   Collection Time: 05/03/23  8:34 AM   Specimen: BLOOD RIGHT ARM  Result Value Ref Range Status   Specimen Description BLOOD RIGHT ARM  Final   Special Requests   Final    BOTTLES DRAWN AEROBIC AND ANAEROBIC Blood Culture results may not be optimal due to an inadequate volume of blood received in culture bottles   Culture   Final    NO GROWTH < 24 HOURS Performed at [facility], [address] [place], [place] [address]    Report Status PENDING  Incomplete    Radiology Reports EEG adult  Result Date: 05/03/2023 Charlsie Quest, MD     05/03/2023  8:31 AM Patient Name: MACKINNON GENOVA MRN:  [id number] Epilepsy Attending: Charlsie Quest Referring Physician/Provider: Leroy Sea, MD Date: 05/02/2023 Duration: 26.22 mins Patient history: 57yo M with ams getting eeg to evaluate for seizure Level of alertness: lethargic, sleep AEDs during EEG study: LCM Technical aspects: This EEG study was done with scalp electrodes positioned according to the 10-20 International system of electrode placement. Electrical activity was reviewed with band pass filter of 1-70Hz , sensitivity of 7 uV/mm, display speed of 107mm/sec with a 60Hz  notched filter applied as appropriate. EEG data were recorded continuously and digitally stored.  Video monitoring was available and reviewed as appropriate. Description: No posterior dominant rhythm was seen. Sleep was characterized by sleep spindles (12 to 14 Hz), maximal frontocentral region.  EEG showed continuous generalized 3 to 6 Hz theta-delta slowing. Abundant generalized spike and wave complex were noted at 1-2hz . Hyperventilation and photic  stimulation were not performed.   ABNORMALITY - Spike and wave,generalized - Continuous slow, generalized  IMPRESSION: This study is consistent with patient's known history of epilepsy with generalized onset.There is also moderate diffuse encephalopathy. No seizures were seen throughout the recording.  Priyanka Annabelle Harman   US Abdomen Limited RUQ (LIVER/GB)  Result Date: 05/02/2023 CLINICAL DATA:  Elevated liver enzymes. EXAM: ULTRASOUND ABDOMEN LIMITED RIGHT UPPER QUADRANT COMPARISON:  None Available. FINDINGS: Evaluation is limited due to overlying bowel gas. Gallbladder: No gallstones or wall thickening visualized. No sonographic Murphy sign noted by sonographer. Common bile duct: Diameter: 1 mm Liver: Mild increased liver echogenicity. Portal vein is patent on color Doppler imaging with normal direction of blood flow towards the liver. Other: None. IMPRESSION: Probable mild fatty liver, otherwise unremarkable right upper quadrant ultrasound. Electronically Signed   By: Elgie Collard M.D.   On: 05/02/2023 22:18   CT Head Wo Contrast  Result Date: 05/02/2023 CLINICAL DATA:  Altered mental status EXAM: CT HEAD WITHOUT CONTRAST TECHNIQUE: Contiguous axial images were obtained from the base of the skull through the vertex without intravenous contrast. RADIATION DOSE REDUCTION: This exam was performed according to the departmental dose-optimization program which includes automated exposure control, adjustment of the mA and/or kV according to patient size and/or use of iterative reconstruction technique. COMPARISON:  03/03/2023 FINDINGS: Examination is limited by artifacts and motion. Brain: No evidence of acute infarction, hemorrhage, mass, mass effect, or midline shift. No hydrocephalus or extra-axial fluid collection. Redemonstrated cerebral volume loss with ex vacuo dilatation ventricles, which appears similar to the prior exam. Vascular: No hyperdense vessel. Skull: Negative for fracture or focal lesion.  Sinuses/Orbits: No acute finding. Other: The mastoid air cells are well aerated. Redemonstrated calcification in right nasopharynx. IMPRESSION: No acute intracranial process. Electronically Signed   By: Wiliam Ke M.D.   On: 05/02/2023 17:24   DG Abd Portable 1V  Result Date: 05/02/2023 CLINICAL DATA:  [id number] Constipated [id number] EXAM: PORTABLE ABDOMEN - 1 VIEW COMPARISON:  None Available. FINDINGS: The bowel gas pattern is non-obstructive. Physiological stool burden. No evidence of pneumoperitoneum, within the limitations of a supine film. Subacute/healing fractures of the anterolateral right eighth through tenth ribs noted. The soft tissues are within normal limits. Surgical changes, devices, tubes and lines: None. IMPRESSION: *Nonobstructive bowel gas pattern.  No constipation. *Subacute/healing fractures of the anterolateral right eighth through tenth ribs. Electronically Signed   By: Jules Schick M.D.   On: 05/02/2023 16:14   DG Chest Port 1 View  Result Date: 05/02/2023 CLINICAL DATA:  Weakness. EXAM: PORTABLE CHEST 1 VIEW COMPARISON:  03/03/2023. FINDINGS: Low lung volume. There are nonspecific  increased interstitial markings throughout bilateral lungs. However, no frank pulmonary edema. No acute consolidation or lung collapse. Bilateral costophrenic angles are clear. No pneumothorax. Stable cardio-mediastinal silhouette. No acute osseous abnormalities. Distal right humerus metallic hardware noted. The soft tissues are within normal limits. IMPRESSION: *Increased interstitial markings throughout bilateral lungs, nonspecific. No acute consolidation or lung collapse. Electronically Signed   By: Jules Schick M.D.   On: 05/02/2023 16:10      Signature  -   Susa Raring M.D on 05/04/2023 at 9:13 AM   -  To page go to [email or web address]

## 2023-05-04 NOTE — Progress Notes (Signed)
PT Cancellation Note  Patient Details Name: [name] MRN: [id number] DOB: September 16, 1965   Cancelled Treatment:    Reason Eval/Treat Not Completed: PT screened, no needs identified, will sign off (Spoke with pt's father who confirms that pt is nonverbal and nonambulatory at baseline. At group home staff has been using hoyer lift to transfer patient to chair. No acute PT needs identified. Will sign off.)   Gladys Damme 05/04/2023, 10:46 AM

## 2023-05-05 DIAGNOSIS — N179 Acute kidney failure, unspecified: Secondary | ICD-10-CM | POA: Diagnosis not present

## 2023-05-05 LAB — CBC WITH DIFFERENTIAL/PLATELET
Abs Immature Granulocytes: 0.04 10*3/uL (ref 0.00–0.07)
Basophils Absolute: 0.1 10*3/uL (ref 0.0–0.1)
Basophils Relative: 1 %
Eosinophils Absolute: 0.1 10*3/uL (ref 0.0–0.5)
Eosinophils Relative: 2 %
HCT: 37 % — ABNORMAL LOW (ref 39.0–52.0)
Hemoglobin: 12.5 g/dL — ABNORMAL LOW (ref 13.0–17.0)
Immature Granulocytes: 1 %
Lymphocytes Relative: 27 %
Lymphs Abs: 1.6 10*3/uL (ref 0.7–4.0)
MCH: 33.3 pg (ref 26.0–34.0)
MCHC: 33.8 g/dL (ref 30.0–36.0)
MCV: 98.7 fL (ref 80.0–100.0)
Monocytes Absolute: 0.5 10*3/uL (ref 0.1–1.0)
Monocytes Relative: 8 %
Neutro Abs: 3.7 10*3/uL (ref 1.7–7.7)
Neutrophils Relative %: 61 %
Platelets: 246 10*3/uL (ref 150–400)
RBC: 3.75 MIL/uL — ABNORMAL LOW (ref 4.22–5.81)
RDW: 12.6 % (ref 11.5–15.5)
WBC: 5.9 10*3/uL (ref 4.0–10.5)
nRBC: 0 % (ref 0.0–0.2)

## 2023-05-05 LAB — COMPREHENSIVE METABOLIC PANEL
ALT: 141 U/L — ABNORMAL HIGH (ref 0–44)
AST: 77 U/L — ABNORMAL HIGH (ref 15–41)
Albumin: 1.7 g/dL — ABNORMAL LOW (ref 3.5–5.0)
Alkaline Phosphatase: 76 U/L (ref 38–126)
Anion gap: 9 (ref 5–15)
BUN: 17 mg/dL (ref 6–20)
CO2: 23 mmol/L (ref 22–32)
Calcium: 7.8 mg/dL — ABNORMAL LOW (ref 8.9–10.3)
Chloride: 103 mmol/L (ref 98–111)
Creatinine, Ser: 0.96 mg/dL (ref 0.61–1.24)
GFR, Estimated: >60 mL/min (ref >60)
Glucose, Bld: 103 mg/dL — ABNORMAL HIGH (ref 70–99)
Potassium: 4.3 mmol/L (ref 3.5–5.1)
Sodium: 135 mmol/L (ref 135–145)
Total Bilirubin: 0.7 mg/dL (ref <1.2)
Total Protein: 5 g/dL — ABNORMAL LOW (ref 6.5–8.1)

## 2023-05-05 LAB — MAGNESIUM: Magnesium: 1.7 mg/dL (ref 1.7–2.4)

## 2023-05-05 LAB — PHOSPHORUS: Phosphorus: 3.8 mg/dL (ref 2.5–4.6)

## 2023-05-05 LAB — CK: Total CK: 876 U/L — ABNORMAL HIGH (ref 49–397)

## 2023-05-05 NOTE — Progress Notes (Signed)
RE: [name] Date of Birth: 2066-05-01 Date: 05/05/23  Please be advised that the above-named patient will require a short-term nursing home stay - anticipated 30 days or less for rehabilitation and strengthening.  The plan is for return home.

## 2023-05-05 NOTE — NC FL2 (Signed)
[place] MEDICAID FL2 LEVEL OF CARE FORM     IDENTIFICATION  Patient Name: [name] Birthdate: 1966-02-27 Sex: male Admission Date (Current Location): 05/02/2023  [facility] and [place] Number:  Producer, television/film/video and Address:  [facility], [address] [place], [place] [address]      Provider Number: [id number]  Attending Physician Name and Address:  Leroy Sea, MD  Relative Name and Phone Number:       Current Level of Care: Hospital Recommended Level of Care: Skilled Nursing Facility Prior Approval Number:    Date Approved/Denied:   PASRR Number: pending  Discharge Plan: SNF    Current Diagnoses: Patient Active Problem List   Diagnosis Date Noted   AKI (acute kidney injury) (HCC) 05/02/2023   Down's syndrome 07/03/2021   Unsteady gait 07/03/2021   SIRS (systemic inflammatory response syndrome) (HCC) 02/15/2021   Bradycardia, drug induced 02/24/2018   Syncope 02/23/2018   Seizure disorder (HCC) 10/28/2016   Obstructive sleep apnea 07/05/2008   Down syndrome 07/05/2008    Orientation RESPIRATION BLADDER Height & Weight      (Developmentally delayed; responds appropriately; non-verbal)  Normal Incontinent, External catheter Weight: 164 lb 14.5 oz (74.8 kg) Height:  5' (152.4 cm)  BEHAVIORAL SYMPTOMS/MOOD NEUROLOGICAL BOWEL NUTRITION STATUS      Incontinent Diet (See DC Summary)  AMBULATORY STATUS COMMUNICATION OF NEEDS Skin   Total Care Verbally Normal                       Personal Care Assistance Level of Assistance  Bathing, Feeding, Dressing Bathing Assistance: Maximum assistance Feeding assistance: Maximum assistance Dressing Assistance: Maximum assistance     Functional Limitations Info  Speech, Sight Sight Info: Impaired   Speech Info: Impaired (non-verbal)    SPECIAL CARE FACTORS FREQUENCY                       Contractures Contractures Info: Not present    Additional Factors  Info  Code Status, Allergies Code Status Info: DNR Allergies Info: Clonidine Derivatives           Current Medications (05/05/2023):  This is the current hospital active medication list Current Facility-Administered Medications  Medication Dose Route Frequency Provider Last Rate Last Admin   acetaminophen (TYLENOL) tablet 650 mg  650 mg Oral Q6H PRN Leroy Sea, MD       Or   acetaminophen (TYLENOL) suppository 650 mg  650 mg Rectal Q6H PRN Leroy Sea, MD       atorvastatin (LIPITOR) tablet 10 mg  10 mg Oral QHS Leroy Sea, MD   10 mg at 05/04/23 2228   azithromycin (ZITHROMAX) 500 mg in sodium chloride 0.9 % 250 mL IVPB  500 mg Intravenous Q24H Leroy Sea, MD   Stopped at 05/04/23 1820   bisacodyl (DULCOLAX) suppository 10 mg  10 mg Rectal PRN Leroy Sea, MD       cefTRIAXone (ROCEPHIN) 1 g in sodium chloride 0.9 % 100 mL IVPB  1 g Intravenous Q24H Leroy Sea, MD   Stopped at 05/04/23 2033   heparin injection 5,000 Units  5,000 Units Subcutaneous Q8H Leroy Sea, MD   5,000 Units at 05/05/23 1449   hydrALAZINE (APRESOLINE) injection 10 mg  10 mg Intravenous Q4H PRN Amin, Ankit C, MD       ipratropium-albuterol (DUONEB) 0.5-2.5 (3) MG/3ML nebulizer solution 3 mL  3 mL Nebulization Q4H PRN Amin, Ankit C, MD       lacosamide (VIMPAT) 100 mg in sodium chloride 0.9 % 25 mL IVPB  100 mg Intravenous Q24H Leroy Sea, MD 70 mL/hr at 05/05/23 1051 100 mg at 05/05/23 1051   And   lacosamide (VIMPAT) 200 mg in sodium chloride 0.9 % 25 mL IVPB  200 mg Intravenous Q24H Leroy Sea, MD 90 mL/hr at 05/04/23 2227 200 mg at 05/04/23 2227   levothyroxine (SYNTHROID, LEVOTHROID) injection 66 mcg  66 mcg Intravenous Daily Leroy Sea, MD   66 mcg at 05/05/23 0930   LORazepam (ATIVAN) injection 1 mg  1 mg Intravenous Q4H PRN Leroy Sea, MD       metoprolol tartrate (LOPRESSOR) injection 5 mg  5 mg Intravenous Q4H PRN Amin, Ankit C, MD        ondansetron (ZOFRAN) injection 4 mg  4 mg Intravenous Q6H PRN Leroy Sea, MD       pantoprazole (PROTONIX) EC tablet 40 mg  40 mg Oral Daily Leroy Sea, MD   40 mg at 05/05/23 0930   senna-docusate (Senokot-S) tablet 1 tablet  1 tablet Oral QHS PRN Amin, Ankit C, MD       traZODone (DESYREL) tablet 50 mg  50 mg Oral QHS PRN Amin, Ankit C, MD         Discharge Medications: Please see discharge summary for a list of discharge medications.  Relevant Imaging Results:  Relevant Lab Results:   Additional Information SSN: [id number] [place], [place]

## 2023-05-05 NOTE — Care Management Important Message (Signed)
Important Message  Patient Details  Name: [name] MRN: [id number] Date of Birth: July 22, 1965   Important Message Given:  Yes - Medicare IM     Dorena Bodo 05/05/2023, 3:37 PM

## 2023-05-05 NOTE — TOC Initial Note (Signed)
Transition of Care [facility]) - Initial/Assessment Note    Patient Details  Name: [name] MRN: [id number] Date of Birth: 12/03/1965  Transition [facility]) CM/SW Contact:    Mearl Latin, LCSW Phone Number: 05/05/2023, 4:06 PM  Clinical Narrative:                 CSW received consult for possible SNF placement at time of discharge. CSW spoke with patient's mother who reported that patient resides at a group home through [facility]. He was moved two weeks ago to a larger one due to falls and needing more assistance as now he is not able to walk. Patient's mother reported concerns about sending patient back to the group home because they allow him to refuse food. CSW did discuss set up of SNF (they do not have one to one staff there either) but she would like to see what SNF beds are available. CSW explained barriers due to patient needing long term under Medicaid. CSW discussed insurance authorization process and will provide Medicare SNF ratings list. CSW will send out referrals for review and provide bed offers as available. Pasrr is currently pending and requires upload once documents are signed.   Skilled Nursing Rehab Facilities-   [email or web address]   Ratings out of 5 stars (5 the highest)   Name Address  Phone # Quality Care Staffing Health Inspection Overall  [facility] [address] [phone] 2 2 5 5   [facility] [address] [place] [phone] 4 2 4 4   [facility] [address], [facility] [phone] [facility] [address], [place] [phone] 3 1 4 3   [facility]  [address], [place] [phone] 4 4 5 5   [facility] [address], [employer] [phone] 3 2 2 2   [facility] [address], [place] [phone] 5 1 2 2   [facility] [address] [place] [phone] 1 1 3 1   [address] ([place]) [address]  [place] [phone] 2 2 2 2   [facility] [address] [place], [place] [phone] 2 2 1 1   [facility] ([place]) [address], [place] [phone] 3 1 1 1   Eligha Bridegroom [address] Liliane Shi [phone] 3 3 4 4   [facility] [address] [place] [phone] 2 2 3 3           [facility] [address], [place] [phone] 4 2 1 1   [facility], [place] [address], [place] [phone] 1 1 2 1   [facility] [address] [place] [phone] 2 1 4 3   [facility] [address] [phone] 2 1 4 3   [facility] [address], [place] [phone] 2 3 3 3           [address] (no [facility]) [address], [place] [phone] 4 5 5 5   [facility] (No [employer]) [address] [place], [place] [phone] 1 2 4 3   [facility] [address], [place] [phone] 2 1 2 1   [facility] (No [employer]) [address], [place] [phone] 4 1 5 4   [facility] [address] [facility] [phone] 3 4 2 2   [facility] [address] [place] [phone] 2 1 1 1   [facility] [address] [place] [phone] 4 2 5 5   [facility]  [address] [place] [phone] 2 2  3 [address] [place] [phone] 4 1 1 1   [facility] [address] [place], [place] [phone] 2 2 3 3           [facility] [address], [place] [phone] 2 1 1 1   [facility] [address], Evlyn Clines  [phone] 3 3 3 3   [facility] (No Humana) [address] [place] [phone] 2 2 4 4    [facility]) [address], [place]al[place] Levan [phone] 2 1 1 1   [facility] [address], [place] [phone] [facility] [place] [address], [place] [phone] [facility] [address] [place], [place] [phone] 5 4 5 5    [facility])  [address], [place] [phone] 1 1 2 1   [facility]) [address] [place] [phone]  2 4 4   [facility] [address] [place] [phone] 3 5 5 5   [address] [place], [place] [phone] 4 2 2 2   [facility] [place]) [address] [place] [phone] 2 1 3 2      Expected Discharge Plan: Skilled Nursing Facility Barriers to Discharge: Awaiting State Approval Cherlyn Roberts), Continued Medical Work up, SNF Pending bed offer   Patient Goals and CMS Choice Patient states their goals for this hospitalization and ongoing recovery are:: A different facility CMS Medicare.gov Compare Post Acute Care list provided to:: Patient Represenative (must comment) Choice offered to / list presented to : Parent [place] ownership interest in [facility].provided to:: Parent NA    Expected Discharge Plan and Services In-house Referral: Clinical Social Work   Post Acute Care Choice: Skilled Nursing Facility Living arrangements for the past 2 months: Group Home                                      Prior Living Arrangements/Services Living arrangements for the past 2 months: Group Home Lives with:: Facility Resident Patient language and need for interpreter reviewed:: Yes Do you feel safe going back to the place where you live?: Yes      Need for Family Participation in Patient Care: Yes (Comment) Care giver support system in place?: Yes (comment) Current home services: DME (WC, hoyer lift) Criminal Activity/Legal Involvement Pertinent to Current Situation/Hospitalization: No - Comment as needed  Activities of Daily Living   ADL Screening (condition at time of admission) Independently performs ADLs?: No Does the patient have a NEW difficulty with bathing/dressing/toileting/self-feeding that is expected to last >3 days?: No Does the patient have a NEW difficulty with  getting in/out of bed, walking, or climbing stairs that is expected to last >3 days?: No Does the patient have a NEW difficulty with communication that is expected to last >3 days?: No Is the patient deaf or have difficulty hearing?: No Does the patient have difficulty seeing, even when wearing glasses/contacts?: No Does the patient have difficulty concentrating, remembering, or making decisions?: No  Permission Sought/Granted Permission sought to share information with : Facility Medical sales representative, Family Supports Permission granted to share information with : No  Share Information with NAME: Judeth Cornfield  Permission granted to share info w AGENCY: SNFs  Permission granted to share info w Relationship: Mother/Father  Permission granted to share info w Contact Information:  [phone]  Emotional Assessment Appearance:: Appears stated age Attitude/Demeanor/Rapport: Unable to Assess Affect (typically observed): Unable to Assess Orientation: :  (non verbal) Alcohol / Substance Use: Not Applicable Psych Involvement: No (comment)  Admission diagnosis:  Hypernatremia [E87.0] AKI (acute kidney injury) (HCC) [N17.9] Patient Active Problem List   Diagnosis Date Noted   AKI (acute kidney injury) (HCC) 05/02/2023   Down's syndrome 07/03/2021   Unsteady gait 07/03/2021   SIRS (systemic inflammatory response syndrome) (HCC) 02/15/2021   Bradycardia, drug induced 02/24/2018   Syncope 02/23/2018   Seizure disorder (HCC) 10/28/2016   Obstructive sleep apnea 07/05/2008   Down syndrome 07/05/2008   PCP:  [facility]cret[facility]:   [facility] ([facility] - [place], [place] - [address] [place]. [address] [place] [place] [address] Phone: [phone] Fax: [phone]     Social Determinants of Health (SDOH) Social History: SDOH Screenings   Food Insecurity: Patient Unable To Answer (05/02/2023)  Housing: High Risk (05/02/2023)  Transportation Needs: Patient  Unable To Answer (05/02/2023)  Utilities: Patient Unable To Answer (05/02/2023)  Social Connections: Unknown (05/28/2022)   [facility]  Tobacco Use: Low Risk  (05/02/2023)   SDOH Interventions:     Readmission Risk Interventions    02/18/2021    2:09 PM  Readmission Risk Prevention Plan  Transportation Screening Complete  PCP or Specialist Appt within 5-7 Days Complete  Home Care Screening Complete  Medication Review (RN CM) Complete

## 2023-05-05 NOTE — Progress Notes (Signed)
Attempted to feed patient and he pursed his lips tightly closed.  I attempted to give sip of water and he would only hold straw in his mouth.  Notified Dr. Burney Gauze and let him know.

## 2023-05-05 NOTE — Plan of Care (Signed)
  Problem: Education: Goal: Knowledge of General Education information will improve Description: Including pain rating scale, medication(s)/side effects and non-pharmacologic comfort measures Outcome: Progressing   Problem: Health Behavior/Discharge Planning: Goal: Ability to manage health-related needs will improve Outcome: Progressing   Problem: Clinical Measurements: Goal: Ability to maintain clinical measurements within normal limits will improve Outcome: Progressing Goal: Will remain free from infection Outcome: Progressing Goal: Diagnostic test results will improve Outcome: Progressing Goal: Respiratory complications will improve Outcome: Progressing Goal: Cardiovascular complication will be avoided Outcome: Progressing   Problem: Activity: Goal: Risk for activity intolerance will decrease Outcome: Progressing   Problem: Nutrition: Goal: Adequate nutrition will be maintained Outcome: Progressing   Problem: Coping: Goal: Level of anxiety will decrease Outcome: Progressing   Problem: Elimination: Goal: Will not experience complications related to bowel motility Outcome: Progressing Goal: Will not experience complications related to urinary retention Outcome: Progressing   Problem: Pain Management: Goal: General experience of comfort will improve Outcome: Progressing   Problem: Safety: Goal: Ability to remain free from injury will improve Outcome: Progressing   Problem: Skin Integrity: Goal: Risk for impaired skin integrity will decrease Outcome: Progressing   Problem: Nutrition Goal: Patient maintains adequate hydration Outcome: Progressing Goal: Patient maintains weight Outcome: Progressing Goal: Patient/Family demonstrates understanding of diet Outcome: Progressing Goal: Patient/Family independently completes tube feeding Outcome: Progressing Goal: Patient will have no more than 5 lb weight change during LOS Outcome: Progressing Goal: Patient will  utilize adaptive techniques to administer nutrition Outcome: Progressing Goal: Patient will verbalize dietary restrictions Outcome: Progressing

## 2023-05-05 NOTE — Progress Notes (Signed)
PROGRESS NOTE                                                                                                                                                                                                             Patient Demographics:    [name], is a 57 y.o. male, DOB - [birth year], NWG:[id number]  Outpatient Primary MD for the patient is [name], [name]    LOS - 3  Admit date - 05/02/2023    Chief Complaint  Patient presents with   Altered Mental Status       Brief Narrative (HPI from H&P)    57 y.o. male,  Down syndrome, apparently nonverbal at baseline, recurrent falls, seizures, hypothyroidism, hypertension who lives in a group home comes into the hospital with altered mental status.  Patient is brought in by his father, apparently patient at baseline is mildly drowsy and usually is alert 10 to 12 hours a day however for the last 2 to 3 weeks he has been increasingly more drowsy not eating or drinking well, finally they brought him to the ER today where he was diagnosed with mild leukocytosis, severe dehydration and AKI.  Possible early sepsis could not be ruled out although he is not febrile, chest x-ray and abdominal x-ray are stable. Patient himself is extremely somnolent and unable to provide any history.    Subjective:    Mcneil Vallo today in bed appears comfortable, nonverbal at baseline, however more awake than  2 days ago   Assessment  & Plan :    1.  Gradually progressive metabolic encephalopathy on top of chronic baseline encephalopathy due to underlying Down syndrome, causing decreased oral intake over the last 2 to 3 weeks, dehydration and AKI.  Clinically sepsis has been ruled out.   Patient had similar presentation according to father few weeks ago secondary to [facility], ever we have confirmed that he was not taking on feet lately, EEG does not show any acute seizures, CT head  unremarkable, with hydration mental status is improving of note he is nonverbal at baseline. Currently on Vimpat for his underlying seizures.  Case discussed with neurologist Dr. [name] on 05/04/2023, no change in treatment plan, patient close to his baseline continue present regimen.    2.  Mild UTI.  Chest x-ray and KUB stable, borderline, finish 5 days  of antibiotics, no sepsis clinically.  Continue to monitor cultures, 1 out of 2 blood cultures likely contamination with Staph epidermidis.   3.  Severe dehydration and AKI due to poor oral intake over the last 2 to 3 weeks.  Hydrate, continue IV fluids another day.  Hold ACE inhibitor.   4.  Hypothyroidism.  Home dose Synthroid.  Stable TSH.   5.  Hypertension.  Hold ACE inhibitor due to AKI as needed IV hydralazine.   6.  Mild rhabdo and asymptomatic transaminitis.  Improved with IV fluids.  7.  Dyslipidemia.  On statin.  8.  Hypomagnesemia, hypophosphatemia.  Replaced.       Condition -fair  Family Communication  : Father and mother at the time of admission.  Mother bedside on 05/05/2023  Long discussion with patient's mother who is power of attorney and father, treat with IV antibiotics, IV fluids and medications, if he continues to decline then DNR, no major heroics.    Code Status : DNR  Consults  : Neurology  PUD Prophylaxis : PPI   Procedures  :     Right upper quadrant ultrasound.  Possible mild fatty liver.    CT head.  Nonacute.    EEG This study is consistent with patient's known history of epilepsy with generalized onset.There is also moderate diffuse encephalopathy. No seizures were seen throughout the recording       Disposition Plan  :    Status is: Inpatient   DVT Prophylaxis  :    heparin injection 5,000 Units Start: 05/02/23 1600   Lab Results  Component Value Date   PLT 199 05/04/2023    Diet :  Diet Order             DIET DYS 3 Room service appropriate? Yes; Fluid consistency: Thin   Diet effective now                    Inpatient Medications  Scheduled Meds:  atorvastatin  10 mg Oral QHS   heparin  5,000 Units Subcutaneous Q8H   levothyroxine  66 mcg Intravenous Daily   pantoprazole  40 mg Oral Daily   Continuous Infusions:  azithromycin Stopped (05/04/23 1820)   cefTRIAXone (ROCEPHIN)  IV Stopped (05/04/23 2033)   lacosamide (VIMPAT) IV Stopped (05/04/23 1135)   And   lacosamide (VIMPAT) IV 200 mg (05/04/23 2227)   PRN Meds:.acetaminophen **OR** acetaminophen, bisacodyl, hydrALAZINE, ipratropium-albuterol, LORazepam, metoprolol tartrate, [DISCONTINUED] ondansetron **OR** ondansetron (ZOFRAN) IV, senna-docusate, traZODone  Antibiotics  :    Anti-infectives (From admission, onward)    Start     Dose/Rate Route Frequency Ordered Stop   05/02/23 1600  cefTRIAXone (ROCEPHIN) 1 g in sodium chloride 0.9 % 100 mL IVPB        1 g 200 mL/hr over 30 Minutes Intravenous Every 24 hours 05/02/23 1533 05/07/23 1559   05/02/23 1600  azithromycin (ZITHROMAX) 500 mg in sodium chloride 0.9 % 250 mL IVPB        500 mg 250 mL/hr over 60 Minutes Intravenous Every 24 hours 05/02/23 1533 05/07/23 1559         Objective:   Vitals:   05/05/23 0000 05/05/23 0351 05/05/23 0400 05/05/23 0800  BP: 115/64 (!) 115/100 117/63 121/75  Pulse: (!) 57 (!) 54 (!) 57 (!) 58  Resp: 14 16 14 12   Temp: 98 F (36.7 C) 98.1 F (36.7 C)  97.6 F (36.4 C)  TempSrc: Axillary Oral  Axillary  SpO2: 100%  100% 100% 100%  Weight:      Height:        Wt Readings from Last 3 Encounters:  05/02/23 74.8 kg  09/24/22 74.8 kg  07/01/22 77.1 kg     Intake/Output Summary (Last 24 hours) at 05/05/2023 1027 Last data filed at 05/05/2023 1007 Gross per 24 hour  Intake 2324.62 ml  Output 1550 ml  Net 774.62 ml     Physical Exam  Awake but nonverbal at baseline, more awake than he was in the ER , will nod his head to few questions, per mother bedside now very close to his  baseline [place].AT,PERRAL Supple Neck, No JVD,   Symmetrical Chest wall movement, Good air movement bilaterally, CTAB RRR,No Gallops,Rubs or new Murmurs,  +ve B.Sounds, Abd Soft, No tenderness,   No Cyanosis, Clubbing or edema       Data Review:    Recent Labs  Lab 05/02/23 1333 05/02/23 1412 05/03/23 0834 05/04/23 0458  WBC 11.7*  --  8.5 6.0  HGB 12.0* 12.2* 12.0* 10.3*  HCT 38.3* 36.0* 37.5* 30.1*  PLT 220  --  201 199  MCV 108.5*  --  105.9* 99.7  MCH 34.0  --  33.9 34.1*  MCHC 31.3  --  32.0 34.2  RDW 13.4  --  13.2 12.8  LYMPHSABS 0.7  --  1.5 1.4  MONOABS 0.6  --  0.5 0.5  EOSABS 0.0  --  0.0 0.1  BASOSABS 0.0  --  0.0 0.0    Recent Labs  Lab 05/02/23 1333 05/02/23 1412 05/02/23 1416 05/02/23 1524 05/02/23 1546 05/03/23 0101 05/03/23 0102 05/03/23 0834 05/03/23 1104 05/04/23 0458  NA 147* 147*  --   --   --   --   --  138  --  136  K 4.4 4.3  --   --   --   --   --  4.0  --  3.9  CL 115* 115*  --   --   --   --   --  108  --  105  CO2 21*  --   --   --   --   --   --  22  --  21*  ANIONGAP 11  --   --   --   --   --   --  8  --  10  GLUCOSE 105* 105*  --   --   --   --   --  113*  --  107*  BUN 60* 56*  --   --   --   --   --  45*  --  31*  CREATININE 2.45* 2.40*  --   --   --   --  1.73* 1.25*  --  0.95  AST 152*  --   --   --   --   --   --  266*  --  169*  ALT 110*  --   --   --   --   --   --  226*  --  197*  ALKPHOS 67  --   --   --   --   --   --  63  --  53  BILITOT 0.7  --   --   --   --   --   --  0.5  --  0.8  ALBUMIN 1.9*  --   --   --   --   --   --  1.8*  --  <1.5*  CRP  --   --   --   --   --   --   --  9.2*  --  6.0*  PROCALCITON  --   --   --   --   --   --  0.54 0.45  --  0.28  LATICACIDVEN  --   --  4.5*  --  3.0*  --   --  1.8  --   --   INR  --   --   --   --   --   --  1.4*  --   --   --   TSH  --   --   --  6.481*  --  4.126  --   --   --   --   AMMONIA  --   --   --   --   --   --   --   --  <10  --   MG  --   --   --   --   --    --   --  1.8  --  1.6*  CALCIUM 8.0*  --   --   --   --   --   --  7.7*  --  7.7*      Recent Labs  Lab 05/02/23 1333 05/02/23 1416 05/02/23 1524 05/02/23 1546 05/03/23 0101 05/03/23 0102 05/03/23 0834 05/03/23 1104 05/04/23 0458  CRP  --   --   --   --   --   --  9.2*  --  6.0*  PROCALCITON  --   --   --   --   --  0.54 0.45  --  0.28  LATICACIDVEN  --  4.5*  --  3.0*  --   --  1.8  --   --   INR  --   --   --   --   --  1.4*  --   --   --   TSH  --   --  6.481*  --  4.126  --   --   --   --   AMMONIA  --   --   --   --   --   --   --  <10  --   MG  --   --   --   --   --   --  1.8  --  1.6*  CALCIUM 8.0*  --   --   --   --   --  7.7*  --  7.7*    --------------------------------------------------------------------------------------------------------------- No results found for: "CHOL", "HDL", "LDLCALC", "LDLDIRECT", "TRIG", "CHOLHDL"  No results found for: "HGBA1C" Recent Labs    05/03/23 0101 05/03/23 0102  TSH 4.126  --   FREET4  --  1.17*   Recent Labs    05/03/23 0101 05/03/23 0823  VITAMINB12  --  577  FOLATE 9.9  --    ------------------------------------------------------------------------------------------------------------------ Cardiac Enzymes No results for input(s): "CKMB", "TROPONINI", "MYOGLOBIN" in the last 168 hours.  Invalid input(s): "CK"  Micro Results Recent Results (from the past 240 hour(s))  Culture, blood (routine x 2)     Status: None (Preliminary result)   Collection Time: 05/02/23  1:57 PM   Specimen: BLOOD LEFT HAND  Result Value Ref Range Status   Specimen Description BLOOD LEFT HAND  Final   Special Requests   Final  BOTTLES DRAWN AEROBIC ONLY Blood Culture results may not be optimal due to an inadequate volume of blood received in culture bottles   Culture   Final    NO GROWTH 3 DAYS Performed at [facility], [address], [place], [place] [address]    Report Status PENDING  Incomplete  MRSA Next Gen by PCR,  Nasal     Status: None   Collection Time: 05/03/23  1:01 AM   Specimen: Nasal Mucosa; Nasal Swab  Result Value Ref Range Status   MRSA by PCR Next Gen NOT DETECTED NOT DETECTED Final    Comment: (NOTE) The GeneXpert MRSA Assay (FDA approved for NASAL specimens only), is one component of a comprehensive MRSA colonization surveillance program. It is not intended to diagnose MRSA infection nor to guide or monitor treatment for MRSA infections. Test performance is not FDA approved in patients less than 68 years old. Performed at [facility], [address], [place], [place] [address]   Respiratory (~20 pathogens) panel by PCR     Status: None   Collection Time: 05/03/23  1:01 AM   Specimen: Nasal Mucosa; Respiratory  Result Value Ref Range Status   Adenovirus NOT DETECTED NOT DETECTED Final   Coronavirus 229E NOT DETECTED NOT DETECTED Final    Comment: (NOTE) The Coronavirus on the Respiratory Panel, DOES NOT test for the novel  Coronavirus (2019 nCoV)    Coronavirus HKU1 NOT DETECTED NOT DETECTED Final   Coronavirus NL63 NOT DETECTED NOT DETECTED Final   Coronavirus OC43 NOT DETECTED NOT DETECTED Final   Metapneumovirus NOT DETECTED NOT DETECTED Final   Rhinovirus / Enterovirus NOT DETECTED NOT DETECTED Final   Influenza A NOT DETECTED NOT DETECTED Final   Influenza B NOT DETECTED NOT DETECTED Final   Parainfluenza Virus 1 NOT DETECTED NOT DETECTED Final   Parainfluenza Virus 2 NOT DETECTED NOT DETECTED Final   Parainfluenza Virus 3 NOT DETECTED NOT DETECTED Final   Parainfluenza Virus 4 NOT DETECTED NOT DETECTED Final   Respiratory Syncytial Virus NOT DETECTED NOT DETECTED Final   Bordetella pertussis NOT DETECTED NOT DETECTED Final   Bordetella Parapertussis NOT DETECTED NOT DETECTED Final   Chlamydophila pneumoniae NOT DETECTED NOT DETECTED Final   Mycoplasma pneumoniae NOT DETECTED NOT DETECTED Final    Comment: Performed at [facility], [address],  [place], [place] [address]  SARS Coronavirus 2 by RT PCR (hospital order, performed in [facility] hospital lab) *cepheid single result test* Nasal Mucosa     Status: None   Collection Time: 05/03/23  1:01 AM   Specimen: Nasal Mucosa; Nasal Swab  Result Value Ref Range Status   SARS Coronavirus 2 by RT PCR NEGATIVE NEGATIVE Final    Comment: Performed at [facility], [address], [place], [place] [address]  Culture, blood (routine x 2)     Status: None (Preliminary result)   Collection Time: 05/03/23  8:34 AM   Specimen: BLOOD RIGHT ARM  Result Value Ref Range Status   Specimen Description BLOOD RIGHT ARM  Final   Special Requests   Final    BOTTLES DRAWN AEROBIC AND ANAEROBIC Blood Culture results may not be optimal due to an inadequate volume of blood received in culture bottles   Culture  Setup Time   Final    GRAM POSITIVE COCCI IN CLUSTERS AEROBIC BOTTLE ONLY CRITICAL RESULT CALLED TO, READ BACK BY AND VERIFIED WITH: PHARMD T. RUDISILL [id number] @ 2220 FH  Performed at [facility], [address] [place], [place] [address]    Culture GRAM POSITIVE COCCI IN CLUSTERS  Final   Report Status PENDING  Incomplete  Blood Culture ID Panel (Reflexed)     Status: Abnormal   Collection Time: 05/03/23  8:34 AM  Result Value Ref Range Status   Enterococcus faecalis NOT DETECTED NOT DETECTED Final   Enterococcus Faecium NOT DETECTED NOT DETECTED Final   Listeria monocytogenes NOT DETECTED NOT DETECTED Final   Staphylococcus species DETECTED (A) NOT DETECTED Final    Comment: CRITICAL RESULT CALLED TO, READ BACK BY AND VERIFIED WITH: PHARMD T. RUDISILL [id number] @ 2220 FH    Staphylococcus aureus (BCID) NOT DETECTED NOT DETECTED Final   Staphylococcus epidermidis DETECTED (A) NOT DETECTED Final    Comment: Methicillin (oxacillin) resistant coagulase negative staphylococcus. Possible blood culture contaminant (unless isolated from more than one blood culture draw or clinical case suggests  pathogenicity). No antibiotic treatment is indicated for blood  culture contaminants. CRITICAL RESULT CALLED TO, READ BACK BY AND VERIFIED WITH: PHARMD T. RUDISILL [id number] @ 2220 FH    Staphylococcus lugdunensis NOT DETECTED NOT DETECTED Final   Streptococcus species NOT DETECTED NOT DETECTED Final   Streptococcus agalactiae NOT DETECTED NOT DETECTED Final   Streptococcus pneumoniae NOT DETECTED NOT DETECTED Final   Streptococcus pyogenes NOT DETECTED NOT DETECTED Final   A.calcoaceticus-baumannii NOT DETECTED NOT DETECTED Final   Bacteroides fragilis NOT DETECTED NOT DETECTED Final   Enterobacterales NOT DETECTED NOT DETECTED Final   Enterobacter cloacae complex NOT DETECTED NOT DETECTED Final   Escherichia coli NOT DETECTED NOT DETECTED Final   Klebsiella aerogenes NOT DETECTED NOT DETECTED Final   Klebsiella oxytoca NOT DETECTED NOT DETECTED Final   Klebsiella pneumoniae NOT DETECTED NOT DETECTED Final   Proteus species NOT DETECTED NOT DETECTED Final   Salmonella species NOT DETECTED NOT DETECTED Final   Serratia marcescens NOT DETECTED NOT DETECTED Final   Haemophilus influenzae NOT DETECTED NOT DETECTED Final   Neisseria meningitidis NOT DETECTED NOT DETECTED Final   Pseudomonas aeruginosa NOT DETECTED NOT DETECTED Final   Stenotrophomonas maltophilia NOT DETECTED NOT DETECTED Final   Candida albicans NOT DETECTED NOT DETECTED Final   Candida auris NOT DETECTED NOT DETECTED Final   Candida glabrata NOT DETECTED NOT DETECTED Final   Candida krusei NOT DETECTED NOT DETECTED Final   Candida parapsilosis NOT DETECTED NOT DETECTED Final   Candida tropicalis NOT DETECTED NOT DETECTED Final   Cryptococcus neoformans/gattii NOT DETECTED NOT DETECTED Final   Methicillin resistance mecA/C DETECTED (A) NOT DETECTED Final    Comment: CRITICAL RESULT CALLED TO, READ BACK BY AND VERIFIED WITH: Anette Riedel RUDISILL [id number] @ 2220 FH Performed at [facility] Lab, [address]  [place], [place] [address]     Radiology Reports EEG adult  Result Date: 05/03/2023 Charlsie Quest, MD     05/03/2023  8:31 AM Patient Name: [name] MRN: [id number] Epilepsy Attending: Charlsie Quest Referring Physician/Provider: Leroy Sea, MD Date: 05/02/2023 Duration: 26.22 mins Patient history: 57yo M with ams getting eeg to evaluate for seizure Level of alertness: lethargic, sleep AEDs during EEG study: LCM Technical aspects: This EEG study was done with scalp electrodes positioned according to the 10-20 International system of electrode placement. Electrical activity was reviewed with band pass filter of 1-70Hz , sensitivity of 7 uV/mm, display speed of 30mm/sec with a 60Hz  notched filter applied as appropriate. EEG data were recorded continuously and digitally  stored.  Video monitoring was available and reviewed as appropriate. Description: No posterior dominant rhythm was seen. Sleep was characterized by sleep spindles (12 to 14 Hz), maximal frontocentral region.  EEG showed continuous generalized 3 to 6 Hz theta-delta slowing. Abundant generalized spike and wave complex were noted at 1-2hz . Hyperventilation and photic stimulation were not performed.   ABNORMALITY - Spike and wave,generalized - Continuous slow, generalized  IMPRESSION: This study is consistent with patient's known history of epilepsy with generalized onset.There is also moderate diffuse encephalopathy. No seizures were seen throughout the recording.  Priyanka Annabelle Harman   US Abdomen Limited RUQ (LIVER/GB)  Result Date: 05/02/2023 CLINICAL DATA:  Elevated liver enzymes. EXAM: ULTRASOUND ABDOMEN LIMITED RIGHT UPPER QUADRANT COMPARISON:  None Available. FINDINGS: Evaluation is limited due to overlying bowel gas. Gallbladder: No gallstones or wall thickening visualized. No sonographic Murphy sign noted by sonographer. Common bile duct: Diameter: 1 mm Liver: Mild increased liver echogenicity. Portal vein is patent on color  Doppler imaging with normal direction of blood flow towards the liver. Other: None. IMPRESSION: Probable mild fatty liver, otherwise unremarkable right upper quadrant ultrasound. Electronically Signed   By: Elgie Collard M.D.   On: 05/02/2023 22:18   CT Head Wo Contrast  Result Date: 05/02/2023 CLINICAL DATA:  Altered mental status EXAM: CT HEAD WITHOUT CONTRAST TECHNIQUE: Contiguous axial images were obtained from the base of the skull through the vertex without intravenous contrast. RADIATION DOSE REDUCTION: This exam was performed according to the departmental dose-optimization program which includes automated exposure control, adjustment of the mA and/or kV according to patient size and/or use of iterative reconstruction technique. COMPARISON:  03/03/2023 FINDINGS: Examination is limited by artifacts and motion. Brain: No evidence of acute infarction, hemorrhage, mass, mass effect, or midline shift. No hydrocephalus or extra-axial fluid collection. Redemonstrated cerebral volume loss with ex vacuo dilatation ventricles, which appears similar to the prior exam. Vascular: No hyperdense vessel. Skull: Negative for fracture or focal lesion. Sinuses/Orbits: No acute finding. Other: The mastoid air cells are well aerated. Redemonstrated calcification in right nasopharynx. IMPRESSION: No acute intracranial process. Electronically Signed   By: Wiliam Ke M.D.   On: 05/02/2023 17:24   DG Abd Portable 1V  Result Date: 05/02/2023 CLINICAL DATA:  [id number] Constipated [id number] EXAM: PORTABLE ABDOMEN - 1 VIEW COMPARISON:  None Available. FINDINGS: The bowel gas pattern is non-obstructive. Physiological stool burden. No evidence of pneumoperitoneum, within the limitations of a supine film. Subacute/healing fractures of the anterolateral right eighth through tenth ribs noted. The soft tissues are within normal limits. Surgical changes, devices, tubes and lines: None. IMPRESSION: *Nonobstructive bowel gas pattern.  No  constipation. *Subacute/healing fractures of the anterolateral right eighth through tenth ribs. Electronically Signed   By: Jules Schick M.D.   On: 05/02/2023 16:14   DG Chest Port 1 View  Result Date: 05/02/2023 CLINICAL DATA:  Weakness. EXAM: PORTABLE CHEST 1 VIEW COMPARISON:  03/03/2023. FINDINGS: Low lung volume. There are nonspecific increased interstitial markings throughout bilateral lungs. However, no frank pulmonary edema. No acute consolidation or lung collapse. Bilateral costophrenic angles are clear. No pneumothorax. Stable cardio-mediastinal silhouette. No acute osseous abnormalities. Distal right humerus metallic hardware noted. The soft tissues are within normal limits. IMPRESSION: *Increased interstitial markings throughout bilateral lungs, nonspecific. No acute consolidation or lung collapse. Electronically Signed   By: Jules Schick M.D.   On: 05/02/2023 16:10      Signature  -   Susa Raring M.D on 05/05/2023 at 10:27 AM   -  To page go to [email or web address]

## 2023-05-06 DIAGNOSIS — N179 Acute kidney failure, unspecified: Secondary | ICD-10-CM | POA: Diagnosis not present

## 2023-05-06 LAB — CBC WITH DIFFERENTIAL/PLATELET
Abs Immature Granulocytes: 0.03 10*3/uL (ref 0.00–0.07)
Basophils Absolute: 0.1 10*3/uL (ref 0.0–0.1)
Basophils Relative: 1 %
Eosinophils Absolute: 0.2 10*3/uL (ref 0.0–0.5)
Eosinophils Relative: 3 %
HCT: 35.1 % — ABNORMAL LOW (ref 39.0–52.0)
Hemoglobin: 12.2 g/dL — ABNORMAL LOW (ref 13.0–17.0)
Immature Granulocytes: 1 %
Lymphocytes Relative: 31 %
Lymphs Abs: 1.8 10*3/uL (ref 0.7–4.0)
MCH: 34.4 pg — ABNORMAL HIGH (ref 26.0–34.0)
MCHC: 34.8 g/dL (ref 30.0–36.0)
MCV: 98.9 fL (ref 80.0–100.0)
Monocytes Absolute: 0.5 10*3/uL (ref 0.1–1.0)
Monocytes Relative: 9 %
Neutro Abs: 3.3 10*3/uL (ref 1.7–7.7)
Neutrophils Relative %: 55 %
Platelets: 237 10*3/uL (ref 150–400)
RBC: 3.55 MIL/uL — ABNORMAL LOW (ref 4.22–5.81)
RDW: 12.6 % (ref 11.5–15.5)
WBC: 5.9 10*3/uL (ref 4.0–10.5)
nRBC: 0 % (ref 0.0–0.2)

## 2023-05-06 LAB — COMPREHENSIVE METABOLIC PANEL
ALT: 105 U/L — ABNORMAL HIGH (ref 0–44)
AST: 60 U/L — ABNORMAL HIGH (ref 15–41)
Albumin: 1.6 g/dL — ABNORMAL LOW (ref 3.5–5.0)
Alkaline Phosphatase: 70 U/L (ref 38–126)
Anion gap: 8 (ref 5–15)
BUN: 15 mg/dL (ref 6–20)
CO2: 23 mmol/L (ref 22–32)
Calcium: 7.6 mg/dL — ABNORMAL LOW (ref 8.9–10.3)
Chloride: 104 mmol/L (ref 98–111)
Creatinine, Ser: 0.98 mg/dL (ref 0.61–1.24)
GFR, Estimated: >60 mL/min (ref >60)
Glucose, Bld: 100 mg/dL — ABNORMAL HIGH (ref 70–99)
Potassium: 4.8 mmol/L (ref 3.5–5.1)
Sodium: 135 mmol/L (ref 135–145)
Total Bilirubin: 0.8 mg/dL (ref <1.2)
Total Protein: 4.5 g/dL — ABNORMAL LOW (ref 6.5–8.1)

## 2023-05-06 LAB — CULTURE, BLOOD (ROUTINE X 2)

## 2023-05-06 LAB — MAGNESIUM: Magnesium: 1.6 mg/dL — ABNORMAL LOW (ref 1.7–2.4)

## 2023-05-06 LAB — CK: Total CK: 726 U/L — ABNORMAL HIGH (ref 49–397)

## 2023-05-06 LAB — PHOSPHORUS: Phosphorus: 3.3 mg/dL (ref 2.5–4.6)

## 2023-05-06 MED ORDER — MAGNESIUM SULFATE 4 GM/100ML IV SOLN
4.0000 g | Freq: Once | INTRAVENOUS | Status: AC
  Administered 2023-05-06: 4 g via INTRAVENOUS
  Filled 2023-05-06: qty 100

## 2023-05-06 NOTE — TOC Progression Note (Addendum)
Transition of Care [facility]) - Progression Note    Patient Details  Name: [name] MRN: [id number] Date of Birth: 12-11-1965  Transition of Care [facility]) CM/SW Contact  Erin Sons, [place] Phone Number: 05/06/2023, 1:05 PM  Clinical Narrative:     CSW met with pt's father bedside. Provided SNF bed offers and star ratings. Father confirmed pt would be needing LTC. CSW explained that bed offers would be dependent on LTC bed availability. He will review offers. TOC will continue to follow. Father would prefer facility in [place]. CSW reached out to [facility] facilities([facility], [facility], [facility]) and inquired if they can accommodate pt for LTC; awaiting responses.   PASRR pending(documents have been uploaded to [facility])  1550: PASRR received: [id number] E Expires 06/05/2023  Expected Discharge Plan: Skilled Nursing Facility Barriers to Discharge: Awaiting State Approval [facility]), Continued Medical Work up, SNF Pending bed offer  Expected Discharge Plan and Services In-house Referral: Clinical Social Work   Post Acute Care Choice: Skilled Nursing Facility Living arrangements for the past 2 months: Group Home                                       Social Determinants of Health (SDOH) Interventions SDOH Screenings   Food Insecurity: Patient Unable To Answer (05/02/2023)  Housing: High Risk (05/02/2023)  Transportation Needs: Patient Unable To Answer (05/02/2023)  Utilities: Patient Unable To Answer (05/02/2023)  Social Connections: Unknown (05/28/2022)   Received from [facility], [facility]  Tobacco Use: Low Risk  (05/02/2023)    Readmission Risk Interventions    02/18/2021    2:09 PM  Readmission Risk Prevention Plan  Transportation Screening Complete  PCP or Specialist Appt within 5-7 Days Complete  Home Care Screening Complete  Medication Review (RN CM) Complete

## 2023-05-06 NOTE — Progress Notes (Signed)
PROGRESS NOTE                                                                                                                                                                                                             Patient Demographics:    [name], is a 57 y.o. male, DOB - [birth year], UXL:[id number]  Outpatient Primary MD for the patient is [name], [name]    LOS - 4  Admit date - 05/02/2023    Chief Complaint  Patient presents with   Altered Mental Status       Brief Narrative (HPI from H&P)    57 y.o. male,  Down syndrome, apparently nonverbal at baseline, recurrent falls, seizures, hypothyroidism, hypertension who lives in a group home comes into the hospital with altered mental status.  Patient is brought in by his father, apparently patient at baseline is mildly drowsy and usually is alert 10 to 12 hours a day however for the last 2 to 3 weeks he has been increasingly more drowsy not eating or drinking well, finally they brought him to the ER today where he was diagnosed with mild leukocytosis, severe dehydration and AKI.  Possible early sepsis could not be ruled out although he is not febrile, chest x-ray and abdominal x-ray are stable. Patient himself is extremely somnolent and unable to provide any history.    Subjective:    [name] today in bed appears comfortable, nonverbal at baseline, however more awake than  2 days ago   Assessment  & Plan :    1.  Gradually progressive metabolic encephalopathy on top of chronic baseline encephalopathy due to underlying Down syndrome, causing decreased oral intake over the last 2 to 3 weeks, dehydration and AKI.  Clinically sepsis has been ruled out.   Patient had similar presentation according to father few weeks ago secondary to [facility], ever we have confirmed that he was not taking on feet lately, EEG does not show any acute seizures, CT head  unremarkable, with hydration mental status is improving of note he is nonverbal at baseline. Currently on Vimpat for his underlying seizures.  Case discussed with neurologist Dr. [name] on 05/04/2023, no change in treatment plan, patient close to his baseline continue present regimen.    2.  Mild UTI.  Chest x-ray and KUB stable, borderline, finish 5 days  of antibiotics, no sepsis clinically.  Continue to monitor cultures, 1 out of 2 blood cultures likely contamination with Staph epidermidis.   3.  Severe dehydration and AKI due to poor oral intake over the last 2 to 3 weeks.  Hydrate, continue IV fluids another day.  Hold ACE inhibitor.   4.  Hypothyroidism.  Home dose Synthroid.  Stable TSH.   5.  Hypertension.  Hold ACE inhibitor due to AKI as needed IV hydralazine.   6.  Mild rhabdo and asymptomatic transaminitis.  Improved with IV fluids.  7.  Dyslipidemia.  On statin.  8.  Hypomagnesemia, hypophosphatemia.  Replaced.       Condition -fair  Family Communication  : Father and mother at the time of admission.  Mother bedside on 05/05/2023  Long discussion with patient's mother who is power of attorney and father, treat with IV antibiotics, IV fluids and medications, if he continues to decline then DNR, no major heroics.  Had discussion with patient's dad bedside on 05/06/2023 he is aware that patient is not eating or drinking on a consistent basis, this could be progression of his underlying condition.  Long-term prognosis remains poor.  Code Status : DNR  Consults  : Neurology  PUD Prophylaxis : PPI   Procedures  :     Right upper quadrant ultrasound.  Possible mild fatty liver.    CT head.  Nonacute.    EEG This study is consistent with patient's known history of epilepsy with generalized onset.There is also moderate diffuse encephalopathy. No seizures were seen throughout the recording       Disposition Plan  :    Status is: Inpatient   DVT Prophylaxis  :     heparin injection 5,000 Units Start: 05/02/23 1600   Lab Results  Component Value Date   PLT 237 05/06/2023    Diet :  Diet Order             DIET DYS 3 Room service appropriate? Yes; Fluid consistency: Thin  Diet effective now                    Inpatient Medications  Scheduled Meds:  atorvastatin  10 mg Oral QHS   heparin  5,000 Units Subcutaneous Q8H   levothyroxine  66 mcg Intravenous Daily   pantoprazole  40 mg Oral Daily   Continuous Infusions:  azithromycin Stopped (05/05/23 1912)   cefTRIAXone (ROCEPHIN)  IV Stopped (05/05/23 1725)   lacosamide (VIMPAT) IV 100 mg (05/06/23 1047)   And   lacosamide (VIMPAT) IV 200 mg (05/05/23 2253)   PRN Meds:.acetaminophen **OR** acetaminophen, bisacodyl, hydrALAZINE, ipratropium-albuterol, LORazepam, metoprolol tartrate, [DISCONTINUED] ondansetron **OR** ondansetron (ZOFRAN) IV, senna-docusate, traZODone  Antibiotics  :    Anti-infectives (From admission, onward)    Start     Dose/Rate Route Frequency Ordered Stop   05/02/23 1600  cefTRIAXone (ROCEPHIN) 1 g in sodium chloride 0.9 % 100 mL IVPB        1 g 200 mL/hr over 30 Minutes Intravenous Every 24 hours 05/02/23 1533 05/07/23 1559   05/02/23 1600  azithromycin (ZITHROMAX) 500 mg in sodium chloride 0.9 % 250 mL IVPB        500 mg 250 mL/hr over 60 Minutes Intravenous Every 24 hours 05/02/23 1533 05/07/23 1559         Objective:   Vitals:   05/06/23 0000 05/06/23 0400 05/06/23 0457 05/06/23 0800  BP: 117/84 (!) 127/55 128/75 131/87  Pulse: 80 73 72  68  Resp: 15 13 (!) 21 17  Temp: 98.2 F (36.8 C)  98.5 F (36.9 C) 97.6 F (36.4 C)  TempSrc: Axillary  Axillary Oral  SpO2: 100% 100% 100% 100%  Weight:      Height:        Wt Readings from Last 3 Encounters:  05/02/23 74.8 kg  09/24/22 74.8 kg  07/01/22 77.1 kg     Intake/Output Summary (Last 24 hours) at 05/06/2023 1056 Last data filed at 05/06/2023 0640 Gross per 24 hour  Intake 385 ml   Output 1250 ml  Net -865 ml     Physical Exam  Awake but nonverbal at baseline, more awake than he was in the ER , will nod his head to few questions, per mother bedside now very close to his baseline [place].AT,PERRAL Supple Neck, No JVD,   Symmetrical Chest wall movement, Good air movement bilaterally, CTAB RRR,No Gallops,Rubs or new Murmurs,  +ve B.Sounds, Abd Soft, No tenderness,   No Cyanosis, Clubbing or edema       Data Review:    Recent Labs  Lab 05/02/23 1333 05/02/23 1412 05/03/23 0834 05/04/23 0458 05/05/23 1509 05/06/23 0435  WBC 11.7*  --  8.5 6.0 5.9 5.9  HGB 12.0* 12.2* 12.0* 10.3* 12.5* 12.2*  HCT 38.3* 36.0* 37.5* 30.1* 37.0* 35.1*  PLT 220  --  201 199 246 237  MCV 108.5*  --  105.9* 99.7 98.7 98.9  MCH 34.0  --  33.9 34.1* 33.3 34.4*  MCHC 31.3  --  32.0 34.2 33.8 34.8  RDW 13.4  --  13.2 12.8 12.6 12.6  LYMPHSABS 0.7  --  1.5 1.4 1.6 1.8  MONOABS 0.6  --  0.5 0.5 0.5 0.5  EOSABS 0.0  --  0.0 0.1 0.1 0.2  BASOSABS 0.0  --  0.0 0.0 0.1 0.1    Recent Labs  Lab 05/02/23 1333 05/02/23 1412 05/02/23 1416 05/02/23 1524 05/02/23 1546 05/03/23 0101 05/03/23 0102 05/03/23 0834 05/03/23 1104 05/04/23 0458 05/05/23 1509 05/06/23 0435  NA 147* 147*  --   --   --   --   --  138  --  136 135 135  K 4.4 4.3  --   --   --   --   --  4.0  --  3.9 4.3 4.8  CL 115* 115*  --   --   --   --   --  108  --  105 103 104  CO2 21*  --   --   --   --   --   --  22  --  21* 23 23  ANIONGAP 11  --   --   --   --   --   --  8  --  10 9 8   GLUCOSE 105* 105*  --   --   --   --   --  113*  --  107* 103* 100*  BUN 60* 56*  --   --   --   --   --  45*  --  31* 17 15  CREATININE 2.45* 2.40*  --   --   --   --  1.73* 1.25*  --  0.95 0.96 0.98  AST 152*  --   --   --   --   --   --  266*  --  169* 77* 60*  ALT 110*  --   --   --   --   --   --  226*  --  197* 141* 105*  ALKPHOS 67  --   --   --   --   --   --  63  --  53 76 70  BILITOT 0.7  --   --   --   --   --   --  0.5   --  0.8 0.7 0.8  ALBUMIN 1.9*  --   --   --   --   --   --  1.8*  --  <1.5* 1.7* 1.6*  CRP  --   --   --   --   --   --   --  9.2*  --  6.0*  --   --   PROCALCITON  --   --   --   --   --   --  0.54 0.45  --  0.28  --   --   LATICACIDVEN  --   --  4.5*  --  3.0*  --   --  1.8  --   --   --   --   INR  --   --   --   --   --   --  1.4*  --   --   --   --   --   TSH  --   --   --  6.481*  --  4.126  --   --   --   --   --   --   AMMONIA  --   --   --   --   --   --   --   --  <10  --   --   --   MG  --   --   --   --   --   --   --  1.8  --  1.6* 1.7 1.6*  CALCIUM 8.0*  --   --   --   --   --   --  7.7*  --  7.7* 7.8* 7.6*      Recent Labs  Lab 05/02/23 1333 05/02/23 1416 05/02/23 1524 05/02/23 1546 05/03/23 0101 05/03/23 0102 05/03/23 0834 05/03/23 1104 05/04/23 0458 05/05/23 1509 05/06/23 0435  CRP  --   --   --   --   --   --  9.2*  --  6.0*  --   --   PROCALCITON  --   --   --   --   --  0.54 0.45  --  0.28  --   --   LATICACIDVEN  --  4.5*  --  3.0*  --   --  1.8  --   --   --   --   INR  --   --   --   --   --  1.4*  --   --   --   --   --   TSH  --   --  6.481*  --  4.126  --   --   --   --   --   --   AMMONIA  --   --   --   --   --   --   --  <10  --   --   --   MG  --   --   --   --   --   --  1.8  --  1.6* 1.7 1.6*  CALCIUM  8.0*  --   --   --   --   --  7.7*  --  7.7* 7.8* 7.6*    --------------------------------------------------------------------------------------------------------------- No results found for: "CHOL", "HDL", "LDLCALC", "LDLDIRECT", "TRIG", "CHOLHDL"  No results found for: "HGBA1C" No results for input(s): "TSH", "T4TOTAL", "FREET4", "T3FREE", "THYROIDAB" in the last 72 hours.  No results for input(s): "VITAMINB12", "FOLATE", "FERRITIN", "TIBC", "IRON", "RETICCTPCT" in the last 72 hours.  ------------------------------------------------------------------------------------------------------------------ Cardiac Enzymes No results for input(s):  "CKMB", "TROPONINI", "MYOGLOBIN" in the last 168 hours.  Invalid input(s): "CK"  Micro Results Recent Results (from the past 240 hour(s))  Culture, blood (routine x 2)     Status: None (Preliminary result)   Collection Time: 05/02/23  1:57 PM   Specimen: BLOOD LEFT HAND  Result Value Ref Range Status   Specimen Description BLOOD LEFT HAND  Final   Special Requests   Final    BOTTLES DRAWN AEROBIC ONLY Blood Culture results may not be optimal due to an inadequate volume of blood received in culture bottles   Culture   Final    NO GROWTH 4 DAYS Performed at [facility], [address] [place], [place] [address]    Report Status PENDING  Incomplete  MRSA Next Gen by PCR, Nasal     Status: None   Collection Time: 05/03/23  1:01 AM   Specimen: Nasal Mucosa; Nasal Swab  Result Value Ref Range Status   MRSA by PCR Next Gen NOT DETECTED NOT DETECTED Final    Comment: (NOTE) The GeneXpert MRSA Assay (FDA approved for NASAL specimens only), is one component of a comprehensive MRSA colonization surveillance program. It is not intended to diagnose MRSA infection nor to guide or monitor treatment for MRSA infections. Test performance is not FDA approved in patients less than 82 years old. Performed at [facility], [address], [place], [place] [address]   Respiratory (~20 pathogens) panel by PCR     Status: None   Collection Time: 05/03/23  1:01 AM   Specimen: Nasal Mucosa; Respiratory  Result Value Ref Range Status   Adenovirus NOT DETECTED NOT DETECTED Final   Coronavirus 229E NOT DETECTED NOT DETECTED Final    Comment: (NOTE) The Coronavirus on the Respiratory Panel, DOES NOT test for the novel  Coronavirus (2019 nCoV)    Coronavirus HKU1 NOT DETECTED NOT DETECTED Final   Coronavirus NL63 NOT DETECTED NOT DETECTED Final   Coronavirus OC43 NOT DETECTED NOT DETECTED Final   Metapneumovirus NOT DETECTED NOT DETECTED Final   Rhinovirus / Enterovirus NOT DETECTED NOT  DETECTED Final   Influenza A NOT DETECTED NOT DETECTED Final   Influenza B NOT DETECTED NOT DETECTED Final   Parainfluenza Virus 1 NOT DETECTED NOT DETECTED Final   Parainfluenza Virus 2 NOT DETECTED NOT DETECTED Final   Parainfluenza Virus 3 NOT DETECTED NOT DETECTED Final   Parainfluenza Virus 4 NOT DETECTED NOT DETECTED Final   Respiratory Syncytial Virus NOT DETECTED NOT DETECTED Final   Bordetella pertussis NOT DETECTED NOT DETECTED Final   Bordetella Parapertussis NOT DETECTED NOT DETECTED Final   Chlamydophila pneumoniae NOT DETECTED NOT DETECTED Final   Mycoplasma pneumoniae NOT DETECTED NOT DETECTED Final    Comment: Performed at [facility], [address] [place], [place] [address]  SARS Coronavirus 2 by RT PCR (hospital order, performed in [facility] hospital lab) *cepheid single result test* Nasal Mucosa     Status: None   Collection Time: 05/03/23  1:01 AM   Specimen: Nasal Mucosa; Nasal Swab  Result Value Ref Range Status   SARS Coronavirus 2 by RT PCR NEGATIVE NEGATIVE Final    Comment: Performed at [facility], [address], [place], [place] [address]  Culture, blood (routine x 2)     Status: Abnormal   Collection Time: 05/03/23  8:34 AM   Specimen: BLOOD RIGHT ARM  Result Value Ref Range Status   Specimen Description BLOOD RIGHT ARM  Final   Special Requests   Final    BOTTLES DRAWN AEROBIC AND ANAEROBIC Blood Culture results may not be optimal due to an inadequate volume of blood received in culture bottles   Culture  Setup Time   Final    GRAM POSITIVE COCCI IN CLUSTERS AEROBIC BOTTLE ONLY CRITICAL RESULT CALLED TO, READ BACK BY AND VERIFIED WITH: PHARMD T. RUDISILL [id number] @ 2220 FH    Culture (A)  Final    STAPHYLOCOCCUS EPIDERMIDIS THE SIGNIFICANCE OF ISOLATING THIS ORGANISM FROM A SINGLE SET OF BLOOD CULTURES WHEN MULTIPLE SETS ARE DRAWN IS UNCERTAIN. PLEASE NOTIFY THE MICROBIOLOGY DEPARTMENT WITHIN ONE WEEK IF SPECIATION AND SENSITIVITIES ARE  REQUIRED. Performed at [facility], [address] [place], [place] [address]    Report Status 05/06/2023 FINAL  Final  Blood Culture ID Panel (Reflexed)     Status: Abnormal   Collection Time: 05/03/23  8:34 AM  Result Value Ref Range Status   Enterococcus faecalis NOT DETECTED NOT DETECTED Final   Enterococcus Faecium NOT DETECTED NOT DETECTED Final   Listeria monocytogenes NOT DETECTED NOT DETECTED Final   Staphylococcus species DETECTED (A) NOT DETECTED Final    Comment: CRITICAL RESULT CALLED TO, READ BACK BY AND VERIFIED WITH: PHARMD T. RUDISILL [id number] @ 2220 FH    Staphylococcus aureus (BCID) NOT DETECTED NOT DETECTED Final   Staphylococcus epidermidis DETECTED (A) NOT DETECTED Final    Comment: Methicillin (oxacillin) resistant coagulase negative staphylococcus. Possible blood culture contaminant (unless isolated from more than one blood culture draw or clinical case suggests pathogenicity). No antibiotic treatment is indicated for blood  culture contaminants. CRITICAL RESULT CALLED TO, READ BACK BY AND VERIFIED WITH: PHARMD T. RUDISILL [id number] @ 2220 FH    Staphylococcus lugdunensis NOT DETECTED NOT DETECTED Final   Streptococcus species NOT DETECTED NOT DETECTED Final   Streptococcus agalactiae NOT DETECTED NOT DETECTED Final   Streptococcus pneumoniae NOT DETECTED NOT DETECTED Final   Streptococcus pyogenes NOT DETECTED NOT DETECTED Final   A.calcoaceticus-baumannii NOT DETECTED NOT DETECTED Final   Bacteroides fragilis NOT DETECTED NOT DETECTED Final   Enterobacterales NOT DETECTED NOT DETECTED Final   Enterobacter cloacae complex NOT DETECTED NOT DETECTED Final   Escherichia coli NOT DETECTED NOT DETECTED Final   Klebsiella aerogenes NOT DETECTED NOT DETECTED Final   Klebsiella oxytoca NOT DETECTED NOT DETECTED Final   Klebsiella pneumoniae NOT DETECTED NOT DETECTED Final   Proteus species NOT DETECTED NOT DETECTED Final   Salmonella species NOT DETECTED NOT  DETECTED Final   Serratia marcescens NOT DETECTED NOT DETECTED Final   Haemophilus influenzae NOT DETECTED NOT DETECTED Final   Neisseria meningitidis NOT DETECTED NOT DETECTED Final   Pseudomonas aeruginosa NOT DETECTED NOT DETECTED Final   Stenotrophomonas maltophilia NOT DETECTED NOT DETECTED Final   Candida albicans NOT DETECTED NOT DETECTED Final   Candida auris NOT DETECTED NOT DETECTED Final   Candida glabrata NOT DETECTED NOT DETECTED Final   Candida krusei NOT DETECTED NOT DETECTED Final  Candida parapsilosis NOT DETECTED NOT DETECTED Final   Candida tropicalis NOT DETECTED NOT DETECTED Final   Cryptococcus neoformans/gattii NOT DETECTED NOT DETECTED Final   Methicillin resistance mecA/C DETECTED (A) NOT DETECTED Final    Comment: CRITICAL RESULT CALLED TO, READ BACK BY AND VERIFIED WITH: Renda Rolls @ 2220 FH Performed at [facility], [address], [place], [place] [address]     Radiology Reports EEG adult  Result Date: 05/03/2023 Charlsie Quest, MD     05/03/2023  8:31 AM Patient Name: JOHNDANIEL MAJKOWSKI MRN: [id number] Epilepsy Attending: Charlsie Quest Referring Physician/Provider: Leroy Sea, MD Date: 05/02/2023 Duration: 26.22 mins Patient history: 57yo M with ams getting eeg to evaluate for seizure Level of alertness: lethargic, sleep AEDs during EEG study: LCM Technical aspects: This EEG study was done with scalp electrodes positioned according to the 10-20 International system of electrode placement. Electrical activity was reviewed with band pass filter of 1-70Hz , sensitivity of 7 uV/mm, display speed of 103mm/sec with a 60Hz  notched filter applied as appropriate. EEG data were recorded continuously and digitally stored.  Video monitoring was available and reviewed as appropriate. Description: No posterior dominant rhythm was seen. Sleep was characterized by sleep spindles (12 to 14 Hz), maximal frontocentral region.  EEG showed continuous  generalized 3 to 6 Hz theta-delta slowing. Abundant generalized spike and wave complex were noted at 1-2hz . Hyperventilation and photic stimulation were not performed.   ABNORMALITY - Spike and wave,generalized - Continuous slow, generalized  IMPRESSION: This study is consistent with patient's known history of epilepsy with generalized onset.There is also moderate diffuse encephalopathy. No seizures were seen throughout the recording.  Priyanka Annabelle Harman   US Abdomen Limited RUQ (LIVER/GB)  Result Date: 05/02/2023 CLINICAL DATA:  Elevated liver enzymes. EXAM: ULTRASOUND ABDOMEN LIMITED RIGHT UPPER QUADRANT COMPARISON:  None Available. FINDINGS: Evaluation is limited due to overlying bowel gas. Gallbladder: No gallstones or wall thickening visualized. No sonographic Murphy sign noted by sonographer. Common bile duct: Diameter: 1 mm Liver: Mild increased liver echogenicity. Portal vein is patent on color Doppler imaging with normal direction of blood flow towards the liver. Other: None. IMPRESSION: Probable mild fatty liver, otherwise unremarkable right upper quadrant ultrasound. Electronically Signed   By: Elgie Collard M.D.   On: 05/02/2023 22:18   CT Head Wo Contrast  Result Date: 05/02/2023 CLINICAL DATA:  Altered mental status EXAM: CT HEAD WITHOUT CONTRAST TECHNIQUE: Contiguous axial images were obtained from the base of the skull through the vertex without intravenous contrast. RADIATION DOSE REDUCTION: This exam was performed according to the departmental dose-optimization program which includes automated exposure control, adjustment of the mA and/or kV according to patient size and/or use of iterative reconstruction technique. COMPARISON:  03/03/2023 FINDINGS: Examination is limited by artifacts and motion. Brain: No evidence of acute infarction, hemorrhage, mass, mass effect, or midline shift. No hydrocephalus or extra-axial fluid collection. Redemonstrated cerebral volume loss with ex vacuo  dilatation ventricles, which appears similar to the prior exam. Vascular: No hyperdense vessel. Skull: Negative for fracture or focal lesion. Sinuses/Orbits: No acute finding. Other: The mastoid air cells are well aerated. Redemonstrated calcification in right nasopharynx. IMPRESSION: No acute intracranial process. Electronically Signed   By: Wiliam Ke M.D.   On: 05/02/2023 17:24   DG Abd Portable 1V  Result Date: 05/02/2023 CLINICAL DATA:  [id number] Constipated [id number] EXAM: PORTABLE ABDOMEN - 1 VIEW COMPARISON:  None Available. FINDINGS: The bowel gas pattern is non-obstructive. Physiological stool  burden. No evidence of pneumoperitoneum, within the limitations of a supine film. Subacute/healing fractures of the anterolateral right eighth through tenth ribs noted. The soft tissues are within normal limits. Surgical changes, devices, tubes and lines: None. IMPRESSION: *Nonobstructive bowel gas pattern.  No constipation. *Subacute/healing fractures of the anterolateral right eighth through tenth ribs. Electronically Signed   By: Jules Schick M.D.   On: 05/02/2023 16:14   DG Chest Port 1 View  Result Date: 05/02/2023 CLINICAL DATA:  Weakness. EXAM: PORTABLE CHEST 1 VIEW COMPARISON:  03/03/2023. FINDINGS: Low lung volume. There are nonspecific increased interstitial markings throughout bilateral lungs. However, no frank pulmonary edema. No acute consolidation or lung collapse. Bilateral costophrenic angles are clear. No pneumothorax. Stable cardio-mediastinal silhouette. No acute osseous abnormalities. Distal right humerus metallic hardware noted. The soft tissues are within normal limits. IMPRESSION: *Increased interstitial markings throughout bilateral lungs, nonspecific. No acute consolidation or lung collapse. Electronically Signed   By: Jules Schick M.D.   On: 05/02/2023 16:10      Signature  -   Susa Raring M.D on 05/06/2023 at 10:56 AM   -  To page go to [email or web address]

## 2023-05-06 NOTE — Plan of Care (Signed)
  Problem: Education: Goal: Knowledge of General Education information will improve Description: Including pain rating scale, medication(s)/side effects and non-pharmacologic comfort measures Outcome: Progressing   Problem: Health Behavior/Discharge Planning: Goal: Ability to manage health-related needs will improve Outcome: Progressing   Problem: Clinical Measurements: Goal: Ability to maintain clinical measurements within normal limits will improve Outcome: Progressing Goal: Will remain free from infection Outcome: Progressing Goal: Diagnostic test results will improve Outcome: Progressing Goal: Respiratory complications will improve Outcome: Progressing Goal: Cardiovascular complication will be avoided Outcome: Progressing   Problem: Activity: Goal: Risk for activity intolerance will decrease Outcome: Progressing   Problem: Nutrition: Goal: Adequate nutrition will be maintained Outcome: Progressing   Problem: Coping: Goal: Level of anxiety will decrease Outcome: Progressing   Problem: Elimination: Goal: Will not experience complications related to bowel motility Outcome: Progressing Goal: Will not experience complications related to urinary retention Outcome: Progressing   Problem: Pain Management: Goal: General experience of comfort will improve Outcome: Progressing   Problem: Safety: Goal: Ability to remain free from injury will improve Outcome: Progressing   Problem: Skin Integrity: Goal: Risk for impaired skin integrity will decrease Outcome: Progressing   Problem: Nutrition Goal: Patient maintains adequate hydration Outcome: Progressing Goal: Patient maintains weight Outcome: Progressing Goal: Patient/Family demonstrates understanding of diet Outcome: Progressing Goal: Patient/Family independently completes tube feeding Outcome: Progressing Goal: Patient will have no more than 5 lb weight change during LOS Outcome: Progressing Goal: Patient will  utilize adaptive techniques to administer nutrition Outcome: Progressing Goal: Patient will verbalize dietary restrictions Outcome: Progressing

## 2023-05-07 DIAGNOSIS — N179 Acute kidney failure, unspecified: Secondary | ICD-10-CM | POA: Diagnosis not present

## 2023-05-07 LAB — CBC WITH DIFFERENTIAL/PLATELET
Abs Immature Granulocytes: 0.04 10*3/uL (ref 0.00–0.07)
Basophils Absolute: 0 10*3/uL (ref 0.0–0.1)
Basophils Relative: 1 %
Eosinophils Absolute: 0.1 10*3/uL (ref 0.0–0.5)
Eosinophils Relative: 1 %
HCT: 33.1 % — ABNORMAL LOW (ref 39.0–52.0)
Hemoglobin: 11.5 g/dL — ABNORMAL LOW (ref 13.0–17.0)
Immature Granulocytes: 1 %
Lymphocytes Relative: 22 %
Lymphs Abs: 1.5 10*3/uL (ref 0.7–4.0)
MCH: 34.4 pg — ABNORMAL HIGH (ref 26.0–34.0)
MCHC: 34.7 g/dL (ref 30.0–36.0)
MCV: 99.1 fL (ref 80.0–100.0)
Monocytes Absolute: 0.5 10*3/uL (ref 0.1–1.0)
Monocytes Relative: 7 %
Neutro Abs: 4.9 10*3/uL (ref 1.7–7.7)
Neutrophils Relative %: 68 %
Platelets: 232 10*3/uL (ref 150–400)
RBC: 3.34 MIL/uL — ABNORMAL LOW (ref 4.22–5.81)
RDW: 12.7 % (ref 11.5–15.5)
WBC: 7.1 10*3/uL (ref 4.0–10.5)
nRBC: 0 % (ref 0.0–0.2)

## 2023-05-07 LAB — COMPREHENSIVE METABOLIC PANEL
ALT: 76 U/L — ABNORMAL HIGH (ref 0–44)
AST: 42 U/L — ABNORMAL HIGH (ref 15–41)
Albumin: 1.6 g/dL — ABNORMAL LOW (ref 3.5–5.0)
Alkaline Phosphatase: 66 U/L (ref 38–126)
Anion gap: 9 (ref 5–15)
BUN: 12 mg/dL (ref 6–20)
CO2: 22 mmol/L (ref 22–32)
Calcium: 7.6 mg/dL — ABNORMAL LOW (ref 8.9–10.3)
Chloride: 103 mmol/L (ref 98–111)
Creatinine, Ser: 0.88 mg/dL (ref 0.61–1.24)
GFR, Estimated: >60 mL/min (ref >60)
Glucose, Bld: 105 mg/dL — ABNORMAL HIGH (ref 70–99)
Potassium: 4.1 mmol/L (ref 3.5–5.1)
Sodium: 134 mmol/L — ABNORMAL LOW (ref 135–145)
Total Bilirubin: 0.6 mg/dL (ref <1.2)
Total Protein: 4.7 g/dL — ABNORMAL LOW (ref 6.5–8.1)

## 2023-05-07 LAB — CK: Total CK: 633 U/L — ABNORMAL HIGH (ref 49–397)

## 2023-05-07 LAB — CULTURE, BLOOD (ROUTINE X 2): Culture: NO GROWTH

## 2023-05-07 LAB — MAGNESIUM: Magnesium: 1.7 mg/dL (ref 1.7–2.4)

## 2023-05-07 NOTE — Plan of Care (Signed)
  Problem: Education: Goal: Knowledge of General Education information will improve Description: Including pain rating scale, medication(s)/side effects and non-pharmacologic comfort measures Outcome: Progressing   Problem: Health Behavior/Discharge Planning: Goal: Ability to manage health-related needs will improve Outcome: Progressing   Problem: Clinical Measurements: Goal: Ability to maintain clinical measurements within normal limits will improve Outcome: Progressing Goal: Will remain free from infection Outcome: Progressing Goal: Diagnostic test results will improve Outcome: Progressing Goal: Respiratory complications will improve Outcome: Progressing Goal: Cardiovascular complication will be avoided Outcome: Progressing   Problem: Activity: Goal: Risk for activity intolerance will decrease Outcome: Progressing   Problem: Nutrition: Goal: Adequate nutrition will be maintained Outcome: Progressing   Problem: Coping: Goal: Level of anxiety will decrease Outcome: Progressing   Problem: Elimination: Goal: Will not experience complications related to bowel motility Outcome: Progressing Goal: Will not experience complications related to urinary retention Outcome: Progressing   Problem: Pain Management: Goal: General experience of comfort will improve Outcome: Progressing   Problem: Safety: Goal: Ability to remain free from injury will improve Outcome: Progressing   Problem: Skin Integrity: Goal: Risk for impaired skin integrity will decrease Outcome: Progressing   Problem: Nutrition Goal: Patient maintains adequate hydration Outcome: Progressing Goal: Patient maintains weight Outcome: Progressing Goal: Patient/Family demonstrates understanding of diet Outcome: Progressing Goal: Patient/Family independently completes tube feeding Outcome: Progressing Goal: Patient will have no more than 5 lb weight change during LOS Outcome: Progressing Goal: Patient will  utilize adaptive techniques to administer nutrition Outcome: Progressing Goal: Patient will verbalize dietary restrictions Outcome: Progressing

## 2023-05-07 NOTE — Progress Notes (Signed)
I assisted feeding this patient this morning and he ate all of his blueberry yogurt cup, all of his mixed fruit cup, carton of milk, and 1 apple juice.  Also, ate about 5 bites of pancakes with syrup and a couple bites of eggs.

## 2023-05-07 NOTE — Progress Notes (Signed)
PROGRESS NOTE                                                                                                                                                                                                             Patient Demographics:    [name], is a 57 y.o. male, DOB - [birth year], ZOX:[id number]  Outpatient Primary MD for the patient is [name], [name]    LOS - 5  Admit date - 05/02/2023    Chief Complaint  Patient presents with   Altered Mental Status       Brief Narrative (HPI from H&P)    57 y.o. male,  Down syndrome, apparently nonverbal at baseline, recurrent falls, seizures, hypothyroidism, hypertension who lives in a group home comes into the hospital with altered mental status.  Patient is brought in by his father, apparently patient at baseline is mildly drowsy and usually is alert 10 to 12 hours a day however for the last 2 to 3 weeks he has been increasingly more drowsy not eating or drinking well, finally they brought him to the ER today where he was diagnosed with mild leukocytosis, severe dehydration and AKI.  Possible early sepsis could not be ruled out although he is not febrile, chest x-ray and abdominal x-ray are stable. Patient himself is extremely somnolent and unable to provide any history.    Subjective:    Keiser [place] today in bed appears comfortable, nonverbal at baseline, however more awake than  2 days ago   Assessment  & Plan :    1.  Gradually progressive metabolic encephalopathy on top of chronic baseline encephalopathy due to underlying Down syndrome, causing decreased oral intake over the last 2 to 3 weeks, dehydration and AKI.  Clinically sepsis has been ruled out.   Patient had similar presentation according to father few weeks ago secondary to [facility], ever we have confirmed that he was not taking on feet lately, EEG does not show any acute seizures, CT head  unremarkable, with hydration mental status is improving of note he is nonverbal at baseline. Currently on Vimpat for his underlying seizures.  Case discussed with neurologist Dr. [name] on 05/04/2023, no change in treatment plan, patient close to his baseline continue present regimen.    2.  Mild UTI.  Chest x-ray and KUB stable, borderline, finish 5 days  of antibiotics, no sepsis clinically.  Continue to monitor cultures, 1 out of 2 blood cultures likely contamination with Staph epidermidis.   3.  Severe dehydration and AKI due to poor oral intake over the last 2 to 3 weeks.  Hydrate, continue IV fluids another day.  Hold ACE inhibitor.   4.  Hypothyroidism.  Home dose Synthroid.  Stable TSH.   5.  Hypertension.  Hold ACE inhibitor due to AKI as needed IV hydralazine.   6.  Mild rhabdo and asymptomatic transaminitis.  Improved with IV fluids.  7.  Dyslipidemia.  On statin.  8.  Hypomagnesemia, hypophosphatemia.  Replaced.       Condition -fair  Family Communication  : Father and mother at the time of admission.  Mother bedside on 05/05/2023  Long discussion with patient's mother who is power of attorney and father, treat with IV antibiotics, IV fluids and medications, if he continues to decline then DNR, no major heroics.  Had discussion with patient's dad bedside on 05/06/2023 he is aware that patient is not eating or drinking on a consistent basis, this could be progression of his underlying condition.  Long-term prognosis remains poor.  Code Status : DNR  Consults  : Neurology  PUD Prophylaxis : PPI   Procedures  :     Right upper quadrant ultrasound.  Possible mild fatty liver.    CT head.  Nonacute.    EEG This study is consistent with patient's known history of epilepsy with generalized onset.There is also moderate diffuse encephalopathy. No seizures were seen throughout the recording       Disposition Plan  :    Status is: Inpatient   DVT Prophylaxis  :     heparin injection 5,000 Units Start: 05/02/23 1600   Lab Results  Component Value Date   PLT 232 05/07/2023    Diet :  Diet Order             DIET DYS 3 Room service appropriate? Yes; Fluid consistency: Thin  Diet effective now                    Inpatient Medications  Scheduled Meds:  atorvastatin  10 mg Oral QHS   heparin  5,000 Units Subcutaneous Q8H   levothyroxine  66 mcg Intravenous Daily   pantoprazole  40 mg Oral Daily   Continuous Infusions:  lacosamide (VIMPAT) IV Stopped (05/06/23 1117)   And   lacosamide (VIMPAT) IV Stopped (05/06/23 2245)   PRN Meds:.acetaminophen **OR** acetaminophen, bisacodyl, hydrALAZINE, ipratropium-albuterol, LORazepam, metoprolol tartrate, [DISCONTINUED] ondansetron **OR** ondansetron (ZOFRAN) IV, senna-docusate, traZODone  Antibiotics  :    Anti-infectives (From admission, onward)    Start     Dose/Rate Route Frequency Ordered Stop   05/02/23 1600  cefTRIAXone (ROCEPHIN) 1 g in sodium chloride 0.9 % 100 mL IVPB        1 g 200 mL/hr over 30 Minutes Intravenous Every 24 hours 05/02/23 1533 05/06/23 1709   05/02/23 1600  azithromycin (ZITHROMAX) 500 mg in sodium chloride 0.9 % 250 mL IVPB        500 mg 250 mL/hr over 60 Minutes Intravenous Every 24 hours 05/02/23 1533 05/06/23 1930         Objective:   Vitals:   05/06/23 2000 05/07/23 0000 05/07/23 0400 05/07/23 0800  BP: (!) 118/93 (!) 126/92 135/80 131/72  Pulse: 77 78 73 63  Resp: 18 18 19 18   Temp: 97.7 F (36.5 C) 97.8 F (36.6  C) 98.6 F (37 C) 98 F (36.7 C)  TempSrc: Oral Oral Oral Oral  SpO2: 100% 100% 100% 100%  Weight:      Height:        Wt Readings from Last 3 Encounters:  05/02/23 74.8 kg  09/24/22 74.8 kg  07/01/22 77.1 kg     Intake/Output Summary (Last 24 hours) at 05/07/2023 0918 Last data filed at 05/07/2023 1540 Gross per 24 hour  Intake 270 ml  Output 900 ml  Net -630 ml     Physical Exam  Awake but nonverbal at baseline,  more awake than he was in the ER , will nod his head to few questions, per mother bedside now very close to his baseline [place].AT,PERRAL Supple Neck, No JVD,   Symmetrical Chest wall movement, Good air movement bilaterally, CTAB RRR,No Gallops,Rubs or new Murmurs,  +ve B.Sounds, Abd Soft, No tenderness,   No Cyanosis, Clubbing or edema       Data Review:    Recent Labs  Lab 05/03/23 0834 05/04/23 0458 05/05/23 1509 05/06/23 0435 05/07/23 0424  WBC 8.5 6.0 5.9 5.9 7.1  HGB 12.0* 10.3* 12.5* 12.2* 11.5*  HCT 37.5* 30.1* 37.0* 35.1* 33.1*  PLT 201 199 246 237 232  MCV 105.9* 99.7 98.7 98.9 99.1  MCH 33.9 34.1* 33.3 34.4* 34.4*  MCHC 32.0 34.2 33.8 34.8 34.7  RDW 13.2 12.8 12.6 12.6 12.7  LYMPHSABS 1.5 1.4 1.6 1.8 1.5  MONOABS 0.5 0.5 0.5 0.5 0.5  EOSABS 0.0 0.1 0.1 0.2 0.1  BASOSABS 0.0 0.0 0.1 0.1 0.0    Recent Labs  Lab 05/02/23 1416 05/02/23 1524 05/02/23 1546 05/03/23 0101 05/03/23 0102 05/03/23 0834 05/03/23 1104 05/04/23 0458 05/05/23 1509 05/06/23 0435 05/07/23 0424  NA  --   --   --   --   --  138  --  136 135 135 134*  K  --   --   --   --   --  4.0  --  3.9 4.3 4.8 4.1  CL  --   --   --   --   --  108  --  105 103 104 103  CO2  --   --   --   --   --  22  --  21* 23 23 22   ANIONGAP  --   --   --   --   --  8  --  10 9 8 9   GLUCOSE  --   --   --   --   --  113*  --  107* 103* 100* 105*  BUN  --   --   --   --   --  45*  --  31* 17 15 12   CREATININE  --   --   --   --  1.73* 1.25*  --  0.95 0.96 0.98 0.88  AST  --   --   --   --   --  266*  --  169* 77* 60* 42*  ALT  --   --   --   --   --  226*  --  197* 141* 105* 76*  ALKPHOS  --   --   --   --   --  63  --  53 76 70 66  BILITOT  --   --   --   --   --  0.5  --  0.8 0.7 0.8 0.6  ALBUMIN  --   --   --   --   --  1.8*  --  <1.5* 1.7* 1.6* 1.6*  CRP  --   --   --   --   --  9.2*  --  6.0*  --   --   --   PROCALCITON  --   --   --   --  0.54 0.45  --  0.28  --   --   --   LATICACIDVEN 4.5*  --  3.0*  --    --  1.8  --   --   --   --   --   INR  --   --   --   --  1.4*  --   --   --   --   --   --   TSH  --  6.481*  --  4.126  --   --   --   --   --   --   --   AMMONIA  --   --   --   --   --   --  <10  --   --   --   --   MG  --   --   --   --   --  1.8  --  1.6* 1.7 1.6*  --   CALCIUM  --   --   --   --   --  7.7*  --  7.7* 7.8* 7.6* 7.6*      Recent Labs  Lab 05/02/23 1416 05/02/23 1524 05/02/23 1546 05/03/23 0101 05/03/23 0102 05/03/23 0834 05/03/23 1104 05/04/23 0458 05/05/23 1509 05/06/23 0435 05/07/23 0424  CRP  --   --   --   --   --  9.2*  --  6.0*  --   --   --   PROCALCITON  --   --   --   --  0.54 0.45  --  0.28  --   --   --   LATICACIDVEN 4.5*  --  3.0*  --   --  1.8  --   --   --   --   --   INR  --   --   --   --  1.4*  --   --   --   --   --   --   TSH  --  6.481*  --  4.126  --   --   --   --   --   --   --   AMMONIA  --   --   --   --   --   --  <10  --   --   --   --   MG  --   --   --   --   --  1.8  --  1.6* 1.7 1.6*  --   CALCIUM  --   --   --   --   --  7.7*  --  7.7* 7.8* 7.6* 7.6*   Radiology Reports No results found.    Signature  -   Susa Raring M.D on 05/07/2023 at 9:18 AM   -  To page go to [email or web address]

## 2023-05-08 DIAGNOSIS — N179 Acute kidney failure, unspecified: Secondary | ICD-10-CM | POA: Diagnosis not present

## 2023-05-08 MED ORDER — MAGNESIUM SULFATE 2 GM/50ML IV SOLN
2.0000 g | Freq: Once | INTRAVENOUS | Status: AC
  Administered 2023-05-08: 2 g via INTRAVENOUS
  Filled 2023-05-08: qty 50

## 2023-05-08 NOTE — Progress Notes (Signed)
Patient drank 1/2 ensure this morning two bites of grits and 1/2 container of applesauce.

## 2023-05-08 NOTE — Plan of Care (Signed)
  Problem: Education: Goal: Knowledge of General Education information will improve Description: Including pain rating scale, medication(s)/side effects and non-pharmacologic comfort measures Outcome: Progressing   Problem: Health Behavior/Discharge Planning: Goal: Ability to manage health-related needs will improve Outcome: Progressing   Problem: Clinical Measurements: Goal: Will remain free from infection Outcome: Progressing Goal: Diagnostic test results will improve Outcome: Progressing Goal: Respiratory complications will improve Outcome: Progressing   Problem: Activity: Goal: Risk for activity intolerance will decrease Outcome: Progressing   Problem: Nutrition: Goal: Adequate nutrition will be maintained Outcome: Progressing   Problem: Coping: Goal: Level of anxiety will decrease Outcome: Progressing   Problem: Elimination: Goal: Will not experience complications related to bowel motility Outcome: Progressing

## 2023-05-08 NOTE — Progress Notes (Signed)
PROGRESS NOTE                                                                                                                                                                                                             Patient Demographics:    [name], is a 57 y.o. male, DOB - 09/29/1965, ZOX:[id number]  Outpatient Primary MD for the patient is [name], [name]    LOS - 6  Admit date - 05/02/2023    Chief Complaint  Patient presents with   Altered Mental Status       Brief Narrative (HPI from H&P)    57 y.o. male,  Down syndrome, apparently nonverbal at baseline, recurrent falls, seizures, hypothyroidism, hypertension who lives in a group home comes into the hospital with altered mental status.  Patient is brought in by his father, apparently patient at baseline is mildly drowsy and usually is alert 10 to 12 hours a day however for the last 2 to 3 weeks he has been increasingly more drowsy not eating or drinking well, finally they brought him to the ER today where he was diagnosed with mild leukocytosis, severe dehydration and AKI.  Possible early sepsis could not be ruled out although he is not febrile, chest x-ray and abdominal x-ray are stable. Patient himself is extremely somnolent and unable to provide any history.    Subjective:    [name] today in bed appears comfortable, nonverbal at baseline, however more awake than  2 days ago   Assessment  & Plan :    1.  Gradually progressive metabolic encephalopathy on top of chronic baseline encephalopathy due to underlying Down syndrome, causing decreased oral intake over the last 2 to 3 weeks, dehydration and AKI.  Clinically sepsis has been ruled out.   Patient had similar presentation according to father few weeks ago secondary to [facility], ever we have confirmed that he was not taking on feet lately, EEG does not show any acute seizures, CT head  unremarkable, with hydration mental status is improving of note he is nonverbal at baseline. Currently on Vimpat for his underlying seizures.  Case discussed with neurologist Dr. [name] on 05/04/2023, no change in treatment plan, patient close to his baseline continue present regimen.    2.  Mild UTI.  Chest x-ray and KUB stable, borderline, finish 5 days  of antibiotics, no sepsis clinically.  Continue to monitor cultures, 1 out of 2 blood cultures likely contamination with Staph epidermidis.   3.  Severe dehydration and AKI due to poor oral intake over the last 2 to 3 weeks.  Hydrate, continue IV fluids another day.  Hold ACE inhibitor.   4.  Hypothyroidism.  Home dose Synthroid.  Stable TSH.   5.  Hypertension.  Hold ACE inhibitor due to AKI as needed IV hydralazine.   6.  Mild rhabdo and asymptomatic transaminitis.  Improved with IV fluids.  7.  Dyslipidemia.  On statin.  8.  Hypomagnesemia, hypophosphatemia.  Replaced.       Condition -fair  Family Communication  : Father and mother at the time of admission.  Mother bedside on 05/05/2023  Long discussion with patient's mother who is power of attorney and father, treat with IV antibiotics, IV fluids and medications, if he continues to decline then DNR, no major heroics.  Had discussion with patient's dad bedside on 05/06/2023 he is aware that patient is not eating or drinking on a consistent basis, this could be progression of his underlying condition.  Long-term prognosis remains poor.  Code Status : DNR  Consults  : Neurology  PUD Prophylaxis : PPI   Procedures  :     Right upper quadrant ultrasound.  Possible mild fatty liver.    CT head.  Nonacute.    EEG This study is consistent with patient's known history of epilepsy with generalized onset.There is also moderate diffuse encephalopathy. No seizures were seen throughout the recording       Disposition Plan  :    Status is: Inpatient   DVT Prophylaxis  :     heparin injection 5,000 Units Start: 05/02/23 1600   Lab Results  Component Value Date   PLT 232 05/07/2023    Diet :  Diet Order             DIET DYS 3 Room service appropriate? Yes; Fluid consistency: Thin  Diet effective now                    Inpatient Medications  Scheduled Meds:  atorvastatin  10 mg Oral QHS   heparin  5,000 Units Subcutaneous Q8H   levothyroxine  66 mcg Intravenous Daily   pantoprazole  40 mg Oral Daily   Continuous Infusions:  lacosamide (VIMPAT) IV 100 mg (05/08/23 0949)   And   lacosamide (VIMPAT) IV 200 mg (05/07/23 2140)   PRN Meds:.acetaminophen **OR** acetaminophen, bisacodyl, hydrALAZINE, ipratropium-albuterol, LORazepam, metoprolol tartrate, [DISCONTINUED] ondansetron **OR** ondansetron (ZOFRAN) IV, senna-docusate, traZODone  Antibiotics  :    Anti-infectives (From admission, onward)    Start     Dose/Rate Route Frequency Ordered Stop   05/02/23 1600  cefTRIAXone (ROCEPHIN) 1 g in sodium chloride 0.9 % 100 mL IVPB        1 g 200 mL/hr over 30 Minutes Intravenous Every 24 hours 05/02/23 1533 05/06/23 1709   05/02/23 1600  azithromycin (ZITHROMAX) 500 mg in sodium chloride 0.9 % 250 mL IVPB        500 mg 250 mL/hr over 60 Minutes Intravenous Every 24 hours 05/02/23 1533 05/06/23 1930         Objective:   Vitals:   05/08/23 0400 05/08/23 0755 05/08/23 0756 05/08/23 0800  BP: 116/66 114/82  116/66  Pulse: 65 64  63  Resp: 16 14  11   Temp: 97.7 F (36.5 C) 97.9 F (36.6  C)    TempSrc: Axillary Axillary Axillary   SpO2: 100% 100%  100%  Weight:      Height:        Wt Readings from Last 3 Encounters:  05/02/23 74.8 kg  09/24/22 74.8 kg  07/01/22 77.1 kg     Intake/Output Summary (Last 24 hours) at 05/08/2023 0954 Last data filed at 05/08/2023 0931 Gross per 24 hour  Intake 0 ml  Output 600 ml  Net -600 ml     Physical Exam  Awake but nonverbal at baseline, more awake than he was in the ER , will nod his  head to few questions, per mother bedside now very close to his baseline [place].AT,PERRAL Supple Neck, No JVD,   Symmetrical Chest wall movement, Good air movement bilaterally, CTAB RRR,No Gallops,Rubs or new Murmurs,  +ve B.Sounds, Abd Soft, No tenderness,   No Cyanosis, Clubbing or edema       Data Review:    Recent Labs  Lab 05/03/23 0834 05/04/23 0458 05/05/23 1509 05/06/23 0435 05/07/23 0424  WBC 8.5 6.0 5.9 5.9 7.1  HGB 12.0* 10.3* 12.5* 12.2* 11.5*  HCT 37.5* 30.1* 37.0* 35.1* 33.1*  PLT 201 199 246 237 232  MCV 105.9* 99.7 98.7 98.9 99.1  MCH 33.9 34.1* 33.3 34.4* 34.4*  MCHC 32.0 34.2 33.8 34.8 34.7  RDW 13.2 12.8 12.6 12.6 12.7  LYMPHSABS 1.5 1.4 1.6 1.8 1.5  MONOABS 0.5 0.5 0.5 0.5 0.5  EOSABS 0.0 0.1 0.1 0.2 0.1  BASOSABS 0.0 0.0 0.1 0.1 0.0    Recent Labs  Lab 05/02/23 1416 05/02/23 1524 05/02/23 1546 05/03/23 0101 05/03/23 0102 05/03/23 0834 05/03/23 1104 05/04/23 0458 05/05/23 1509 05/06/23 0435 05/07/23 0424 05/07/23 0952  NA  --   --   --   --   --  138  --  136 135 135 134*  --   K  --   --   --   --   --  4.0  --  3.9 4.3 4.8 4.1  --   CL  --   --   --   --   --  108  --  105 103 104 103  --   CO2  --   --   --   --   --  22  --  21* 23 23 22   --   ANIONGAP  --   --   --   --   --  8  --  10 9 8 9   --   GLUCOSE  --   --   --   --   --  113*  --  107* 103* 100* 105*  --   BUN  --   --   --   --   --  45*  --  31* 17 15 12   --   CREATININE  --   --   --   --  1.73* 1.25*  --  0.95 0.96 0.98 0.88  --   AST  --   --   --   --   --  266*  --  169* 77* 60* 42*  --   ALT  --   --   --   --   --  226*  --  197* 141* 105* 76*  --   ALKPHOS  --   --   --   --   --  63  --  53 76 70 66  --  BILITOT  --   --   --   --   --  0.5  --  0.8 0.7 0.8 0.6  --   ALBUMIN  --   --   --   --   --  1.8*  --  <1.5* 1.7* 1.6* 1.6*  --   CRP  --   --   --   --   --  9.2*  --  6.0*  --   --   --   --   PROCALCITON  --   --   --   --  0.54 0.45  --  0.28  --   --   --    --   LATICACIDVEN 4.5*  --  3.0*  --   --  1.8  --   --   --   --   --   --   INR  --   --   --   --  1.4*  --   --   --   --   --   --   --   TSH  --  6.481*  --  4.126  --   --   --   --   --   --   --   --   AMMONIA  --   --   --   --   --   --  <10  --   --   --   --   --   MG  --   --   --   --   --  1.8  --  1.6* 1.7 1.6*  --  1.7  CALCIUM  --   --   --   --   --  7.7*  --  7.7* 7.8* 7.6* 7.6*  --       Recent Labs  Lab 05/02/23 1416 05/02/23 1524 05/02/23 1546 05/03/23 0101 05/03/23 0102 05/03/23 0834 05/03/23 1104 05/04/23 0458 05/05/23 1509 05/06/23 0435 05/07/23 0424 05/07/23 0952  CRP  --   --   --   --   --  9.2*  --  6.0*  --   --   --   --   PROCALCITON  --   --   --   --  0.54 0.45  --  0.28  --   --   --   --   LATICACIDVEN 4.5*  --  3.0*  --   --  1.8  --   --   --   --   --   --   INR  --   --   --   --  1.4*  --   --   --   --   --   --   --   TSH  --  6.481*  --  4.126  --   --   --   --   --   --   --   --   AMMONIA  --   --   --   --   --   --  <10  --   --   --   --   --   MG  --   --   --   --   --  1.8  --  1.6* 1.7 1.6*  --  1.7  CALCIUM  --   --   --   --   --  7.7*  --  7.7* 7.8* 7.6* 7.6*  --    Radiology Reports No results found.    Signature  -   Susa Raring M.D on 05/08/2023 at 9:54 AM   -  To page go to [email or web address]

## 2023-05-08 NOTE — Plan of Care (Signed)
  Problem: Education: Goal: Knowledge of General Education information will improve Description: Including pain rating scale, medication(s)/side effects and non-pharmacologic comfort measures Outcome: Progressing   Problem: Clinical Measurements: Goal: Will remain free from infection Outcome: Progressing Goal: Diagnostic test results will improve Outcome: Progressing Goal: Cardiovascular complication will be avoided Outcome: Progressing   Problem: Nutrition: Goal: Adequate nutrition will be maintained Outcome: Progressing   Problem: Coping: Goal: Level of anxiety will decrease Outcome: Progressing   Problem: Elimination: Goal: Will not experience complications related to bowel motility Outcome: Progressing Goal: Will not experience complications related to urinary retention Outcome: Progressing   Problem: Safety: Goal: Ability to remain free from injury will improve Outcome: Progressing

## 2023-05-09 ENCOUNTER — Inpatient Hospital Stay (HOSPITAL_COMMUNITY): Payer: Medicare Other

## 2023-05-09 DIAGNOSIS — N179 Acute kidney failure, unspecified: Secondary | ICD-10-CM | POA: Diagnosis not present

## 2023-05-09 LAB — URINALYSIS, ROUTINE W REFLEX MICROSCOPIC
Bilirubin Urine: NEGATIVE
Glucose, UA: NEGATIVE mg/dL
Hgb urine dipstick: NEGATIVE
Ketones, ur: NEGATIVE mg/dL
Leukocytes,Ua: NEGATIVE
Nitrite: NEGATIVE
Protein, ur: NEGATIVE mg/dL
Specific Gravity, Urine: 1.013 (ref 1.005–1.030)
pH: 6 (ref 5.0–8.0)

## 2023-05-09 MED ORDER — LACOSAMIDE 50 MG PO TABS
100.0000 mg | ORAL_TABLET | Freq: Every day | ORAL | Status: DC
Start: 2023-05-10 — End: 2023-05-10
  Filled 2023-05-09: qty 2

## 2023-05-09 MED ORDER — LEVOTHYROXINE SODIUM 88 MCG PO TABS
88.0000 ug | ORAL_TABLET | Freq: Every day | ORAL | Status: DC
  Administered 2023-05-10: 88 ug via ORAL
  Filled 2023-05-09: qty 1

## 2023-05-09 MED ORDER — LACOSAMIDE 50 MG PO TABS
200.0000 mg | ORAL_TABLET | Freq: Every day | ORAL | Status: DC
  Administered 2023-05-09: 200 mg via ORAL
  Filled 2023-05-09: qty 4

## 2023-05-09 MED ORDER — SODIUM CHLORIDE 0.9 % IV SOLN
3.0000 g | Freq: Four times a day (QID) | INTRAVENOUS | Status: AC
  Administered 2023-05-09 – 2023-05-13 (×17): 3 g via INTRAVENOUS
  Filled 2023-05-09 (×17): qty 8

## 2023-05-09 NOTE — TOC Progression Note (Addendum)
Transition of Care [facility]) - Progression Note    Patient Details  Name: [name] MRN: [id number] Date of Birth: 08-Jul-1965  [facility] CM/SW Contact  Mearl Latin, LCSW Phone Number: 05/09/2023, 10:16 AM  Clinical Narrative:    10:16 AM-CSW met with patient's father. He has selected [address] or [address] [facility] not have l[facility] beds at this time. Awaiting response from [facility].   12pm-[facility] can accept patient and will discuss with family for bed tomorrow. CSW left voicemail for patient's father.   3:38 PM-[facility] was able to speak with patient's mother and can accept patient tomorrow.    Expected Discharge Plan: Skilled Nursing Facility Barriers to Discharge: Awaiting State Approval (PASRR), Continued Medical Work up, SNF Pending bed offer  Expected Discharge Plan and Services In-house Referral: Clinical Social Work   Post Acute Care Choice: Skilled Nursing Facility Living arrangements for the past 2 months: Group Home                                       Social Determinants of Health (SDOH) Interventions SDOH Screenings   Food Insecurity: Patient Unable To Answer (05/02/2023)  Housing: High Risk (05/02/2023)  Transportation Needs: Patient Unable To Answer (05/02/2023)  Utilities: Patient Unable To Answer (05/02/2023)  Social Connections: Unknown (05/28/2022)   Received from [facility], [facility]  Tobacco Use: Low Risk  (05/02/2023)    Readmission Risk Interventions    02/18/2021    2:09 PM  Readmission Risk Prevention Plan  Transportation Screening Complete  PCP or Specialist Appt within 5-7 Days Complete  Home Care Screening Complete  Medication Review (RN CM) Complete

## 2023-05-09 NOTE — Progress Notes (Signed)
PROGRESS NOTE                                                                                                                                                                                                             Patient Demographics:    [name], is a 57 y.o. male, DOB - 24-Jan-1966, ZOX:[id number]  Outpatient Primary MD for the patient is [name], [name]    LOS - 7  Admit date - 05/02/2023    Chief Complaint  Patient presents with   Altered Mental Status       Brief Narrative (HPI from H&P)    57 y.o. male,  Down syndrome, apparently nonverbal at baseline, recurrent falls, seizures, hypothyroidism, hypertension who lives in a group home comes into the hospital with altered mental status.  Patient is brought in by his father, apparently patient at baseline is mildly drowsy and usually is alert 10 to 12 hours a day however for the last 2 to 3 weeks he has been increasingly more drowsy not eating or drinking well, finally they brought him to the ER today where he was diagnosed with mild leukocytosis, severe dehydration and AKI.  Possible early sepsis could not be ruled out although he is not febrile, chest x-ray and abdominal x-ray are stable. Patient himself is extremely somnolent and unable to provide any history.    Subjective:    [name] today in bed appears comfortable, nonverbal at baseline, however more awake than  2 days ago   Assessment  & Plan :    1.  Gradually progressive metabolic encephalopathy on top of chronic baseline encephalopathy due to underlying Down syndrome, causing decreased oral intake over the last 2 to 3 weeks, dehydration and AKI.  Clinically sepsis has been ruled out.   Patient had similar presentation according to father few weeks ago secondary to [facility], ever we have confirmed that he was not taking on feet lately, EEG does not show any acute seizures, CT head  unremarkable, with hydration mental status is improving of note he is nonverbal at baseline. Currently on Vimpat for his underlying seizures.  Case discussed with neurologist Dr. [name] on 05/04/2023, no change in treatment plan, patient close to his baseline continue present regimen.    2.  Mild UTI.  Chest x-ray and KUB stable, borderline, finish 5 days  of antibiotics, no sepsis clinically.  Continue to monitor cultures, 1 out of 2 blood cultures likely contamination with Staph epidermidis.   3.  Severe dehydration and AKI due to poor oral intake over the last 2 to 3 weeks.  Hydrate, continue IV fluids another day.  Hold ACE inhibitor.   4.  Hypothyroidism.  Home dose Synthroid.  Stable TSH.   5.  Hypertension.  Hold ACE inhibitor due to AKI as needed IV hydralazine.   6.  Mild rhabdo and asymptomatic transaminitis.  Improved with IV fluids.  7.  Dyslipidemia.  On statin.  8.  Hypomagnesemia, hypophosphatemia.  Replaced.       Condition -fair  Family Communication  : Father and mother at the time of admission.  Mother bedside on 05/05/2023  Long discussion with patient's mother who is power of attorney and father, treat with IV antibiotics, IV fluids and medications, if he continues to decline then DNR, no major heroics.  Had discussion with patient's dad bedside on 05/06/2023 he is aware that patient is not eating or drinking on a consistent basis, this could be progression of his underlying condition.  Long-term prognosis remains poor.  Code Status : DNR  Consults  : Neurology  PUD Prophylaxis : PPI   Procedures  :     Right upper quadrant ultrasound.  Possible mild fatty liver.    CT head.  Nonacute.    EEG This study is consistent with patient's known history of epilepsy with generalized onset.There is also moderate diffuse encephalopathy. No seizures were seen throughout the recording       Disposition Plan  :    Status is: Inpatient   DVT Prophylaxis  :     heparin injection 5,000 Units Start: 05/02/23 1600   Lab Results  Component Value Date   PLT 232 05/07/2023    Diet :  Diet Order             DIET DYS 3 Room service appropriate? Yes; Fluid consistency: Thin  Diet effective now                    Inpatient Medications  Scheduled Meds:  atorvastatin  10 mg Oral QHS   heparin  5,000 Units Subcutaneous Q8H   levothyroxine  66 mcg Intravenous Daily   pantoprazole  40 mg Oral Daily   Continuous Infusions:  lacosamide (VIMPAT) IV 100 mg (05/09/23 0948)   And   lacosamide (VIMPAT) IV 200 mg (05/08/23 2140)   PRN Meds:.acetaminophen **OR** acetaminophen, bisacodyl, hydrALAZINE, ipratropium-albuterol, LORazepam, metoprolol tartrate, [DISCONTINUED] ondansetron **OR** ondansetron (ZOFRAN) IV, senna-docusate, traZODone  Antibiotics  :    Anti-infectives (From admission, onward)    Start     Dose/Rate Route Frequency Ordered Stop   05/02/23 1600  cefTRIAXone (ROCEPHIN) 1 g in sodium chloride 0.9 % 100 mL IVPB        1 g 200 mL/hr over 30 Minutes Intravenous Every 24 hours 05/02/23 1533 05/06/23 1709   05/02/23 1600  azithromycin (ZITHROMAX) 500 mg in sodium chloride 0.9 % 250 mL IVPB        500 mg 250 mL/hr over 60 Minutes Intravenous Every 24 hours 05/02/23 1533 05/06/23 1930         Objective:   Vitals:   05/08/23 2345 05/08/23 2346 05/09/23 0502 05/09/23 0800  BP:  132/69 120/61 113/68  Pulse: 78 81 66 69  Resp: 14 14 16 16   Temp:  99.2 F (37.3 C) 99.4 F (  37.4 C)   TempSrc:  Axillary Axillary   SpO2: 100% 100%  100%  Weight:      Height:        Wt Readings from Last 3 Encounters:  05/02/23 74.8 kg  09/24/22 74.8 kg  07/01/22 77.1 kg     Intake/Output Summary (Last 24 hours) at 05/09/2023 1009 Last data filed at 05/09/2023 0508 Gross per 24 hour  Intake 560 ml  Output 1150 ml  Net -590 ml     Physical Exam  Awake but nonverbal at baseline, more awake than he was in the ER , will nod his  head to few questions, per mother bedside now very close to his baseline [place].AT,PERRAL Supple Neck, No JVD,   Symmetrical Chest wall movement, Good air movement bilaterally, CTAB RRR,No Gallops,Rubs or new Murmurs,  +ve B.Sounds, Abd Soft, No tenderness,   No Cyanosis, Clubbing or edema       Data Review:    Recent Labs  Lab 05/03/23 0834 05/04/23 0458 05/05/23 1509 05/06/23 0435 05/07/23 0424  WBC 8.5 6.0 5.9 5.9 7.1  HGB 12.0* 10.3* 12.5* 12.2* 11.5*  HCT 37.5* 30.1* 37.0* 35.1* 33.1*  PLT 201 199 246 237 232  MCV 105.9* 99.7 98.7 98.9 99.1  MCH 33.9 34.1* 33.3 34.4* 34.4*  MCHC 32.0 34.2 33.8 34.8 34.7  RDW 13.2 12.8 12.6 12.6 12.7  LYMPHSABS 1.5 1.4 1.6 1.8 1.5  MONOABS 0.5 0.5 0.5 0.5 0.5  EOSABS 0.0 0.1 0.1 0.2 0.1  BASOSABS 0.0 0.0 0.1 0.1 0.0    Recent Labs  Lab 05/02/23 1416 05/02/23 1524 05/02/23 1546 05/03/23 0101 05/03/23 0102 05/03/23 0834 05/03/23 1104 05/04/23 0458 05/05/23 1509 05/06/23 0435 05/07/23 0424 05/07/23 0952  NA  --   --   --   --   --  138  --  136 135 135 134*  --   K  --   --   --   --   --  4.0  --  3.9 4.3 4.8 4.1  --   CL  --   --   --   --   --  108  --  105 103 104 103  --   CO2  --   --   --   --   --  22  --  21* 23 23 22   --   ANIONGAP  --   --   --   --   --  8  --  10 9 8 9   --   GLUCOSE  --   --   --   --   --  113*  --  107* 103* 100* 105*  --   BUN  --   --   --   --   --  45*  --  31* 17 15 12   --   CREATININE  --   --   --   --  1.73* 1.25*  --  0.95 0.96 0.98 0.88  --   AST  --   --   --   --   --  266*  --  169* 77* 60* 42*  --   ALT  --   --   --   --   --  226*  --  197* 141* 105* 76*  --   ALKPHOS  --   --   --   --   --  63  --  53 76 70 66  --  BILITOT  --   --   --   --   --  0.5  --  0.8 0.7 0.8 0.6  --   ALBUMIN  --   --   --   --   --  1.8*  --  <1.5* 1.7* 1.6* 1.6*  --   CRP  --   --   --   --   --  9.2*  --  6.0*  --   --   --   --   PROCALCITON  --   --   --   --  0.54 0.45  --  0.28  --   --   --    --   LATICACIDVEN 4.5*  --  3.0*  --   --  1.8  --   --   --   --   --   --   INR  --   --   --   --  1.4*  --   --   --   --   --   --   --   TSH  --  6.481*  --  4.126  --   --   --   --   --   --   --   --   AMMONIA  --   --   --   --   --   --  <10  --   --   --   --   --   MG  --   --   --   --   --  1.8  --  1.6* 1.7 1.6*  --  1.7  CALCIUM  --   --   --   --   --  7.7*  --  7.7* 7.8* 7.6* 7.6*  --       Recent Labs  Lab 05/02/23 1416 05/02/23 1524 05/02/23 1546 05/03/23 0101 05/03/23 0102 05/03/23 0834 05/03/23 1104 05/04/23 0458 05/05/23 1509 05/06/23 0435 05/07/23 0424 05/07/23 0952  CRP  --   --   --   --   --  9.2*  --  6.0*  --   --   --   --   PROCALCITON  --   --   --   --  0.54 0.45  --  0.28  --   --   --   --   LATICACIDVEN 4.5*  --  3.0*  --   --  1.8  --   --   --   --   --   --   INR  --   --   --   --  1.4*  --   --   --   --   --   --   --   TSH  --  6.481*  --  4.126  --   --   --   --   --   --   --   --   AMMONIA  --   --   --   --   --   --  <10  --   --   --   --   --   MG  --   --   --   --   --  1.8  --  1.6* 1.7 1.6*  --  1.7  CALCIUM  --   --   --   --   --  7.7*  --  7.7* 7.8* 7.6* 7.6*  --    Radiology Reports No results found.    Signature  -   Susa Raring M.D on 05/09/2023 at 10:09 AM   -  To page go to [email or web address]

## 2023-05-09 NOTE — Progress Notes (Signed)
Pharmacy Antibiotic Note  [name] is a 57 y.o. male admitted on 05/02/2023 with pneumonia.  Pharmacy has been consulted for Unasyn dosing.  Pt with Down syndrome who has been here for AMS. He recently finished 5d for abx for PNA. Suspecting aspiration PNA now with new fever. Unasyn ordered.  Scr<1  Plan: Unasyn 3g IV q6  Height: 5' (152.4 cm) Weight: 74.8 kg (164 lb 14.5 oz) IBW/kg (Calculated) : 50  Temp (24hrs), Avg:99.6 F (37.6 C), Min:99.1 F (37.3 C), Max:100.7 F (38.2 C)  Recent Labs  Lab 05/03/23 0834 05/04/23 0458 05/05/23 1509 05/06/23 0435 05/07/23 0424  WBC 8.5 6.0 5.9 5.9 7.1  CREATININE 1.25* 0.95 0.96 0.98 0.88  LATICACIDVEN 1.8  --   --   --   --     Estimated Creatinine Clearance: 80.4 mL/min (by C-G formula based on SCr of 0.88 mg/dL).    Allergies  Allergen Reactions   Clonidine Derivatives Other (See Comments)    Causes symptomatic bradycardia with syncope and hypotension    Antimicrobials this admission: Ctx/azith 12/2>>(12/6)  Unasyn 12/9>>  Dose adjustments this admission:   Microbiology results: 12/2 BCx: NgF   BCID + staph epi 1/3  w/ resistance (likely contaminant)  Resp PCR negative  12/3 MRSA PCR: negative 12/9 blood>>  Ulyses Southward, PharmD, [place], AAHIVP, CPP Infectious Disease Pharmacist 05/09/2023 5:12 PM

## 2023-05-10 ENCOUNTER — Inpatient Hospital Stay (HOSPITAL_COMMUNITY): Payer: Medicare Other

## 2023-05-10 DIAGNOSIS — N179 Acute kidney failure, unspecified: Secondary | ICD-10-CM | POA: Diagnosis not present

## 2023-05-10 LAB — CBC WITH DIFFERENTIAL/PLATELET
Abs Immature Granulocytes: 0.04 10*3/uL (ref 0.00–0.07)
Basophils Absolute: 0 10*3/uL (ref 0.0–0.1)
Basophils Relative: 1 %
Eosinophils Absolute: 0.1 10*3/uL (ref 0.0–0.5)
Eosinophils Relative: 1 %
HCT: 29.1 % — ABNORMAL LOW (ref 39.0–52.0)
Hemoglobin: 9.8 g/dL — ABNORMAL LOW (ref 13.0–17.0)
Immature Granulocytes: 1 %
Lymphocytes Relative: 29 %
Lymphs Abs: 1.6 10*3/uL (ref 0.7–4.0)
MCH: 33.9 pg (ref 26.0–34.0)
MCHC: 33.7 g/dL (ref 30.0–36.0)
MCV: 100.7 fL — ABNORMAL HIGH (ref 80.0–100.0)
Monocytes Absolute: 0.6 10*3/uL (ref 0.1–1.0)
Monocytes Relative: 11 %
Neutro Abs: 3.3 10*3/uL (ref 1.7–7.7)
Neutrophils Relative %: 57 %
Platelets: 302 10*3/uL (ref 150–400)
RBC: 2.89 MIL/uL — ABNORMAL LOW (ref 4.22–5.81)
RDW: 13.2 % (ref 11.5–15.5)
WBC: 5.7 10*3/uL (ref 4.0–10.5)
nRBC: 0 % (ref 0.0–0.2)

## 2023-05-10 LAB — BASIC METABOLIC PANEL
Anion gap: 7 (ref 5–15)
BUN: 13 mg/dL (ref 6–20)
CO2: 23 mmol/L (ref 22–32)
Calcium: 7.6 mg/dL — ABNORMAL LOW (ref 8.9–10.3)
Chloride: 104 mmol/L (ref 98–111)
Creatinine, Ser: 0.64 mg/dL (ref 0.61–1.24)
GFR, Estimated: >60 mL/min (ref >60)
Glucose, Bld: 103 mg/dL — ABNORMAL HIGH (ref 70–99)
Potassium: 3.7 mmol/L (ref 3.5–5.1)
Sodium: 134 mmol/L — ABNORMAL LOW (ref 135–145)

## 2023-05-10 LAB — PROCALCITONIN: Procalcitonin: <0.1 ng/mL

## 2023-05-10 LAB — C-REACTIVE PROTEIN: CRP: 6.1 mg/dL — ABNORMAL HIGH (ref <1.0)

## 2023-05-10 MED ORDER — SODIUM CHLORIDE 0.9 % IV SOLN
200.0000 mg | Freq: Every day | INTRAVENOUS | Status: DC
  Administered 2023-05-10 – 2023-05-13 (×4): 200 mg via INTRAVENOUS
  Filled 2023-05-10 (×4): qty 20

## 2023-05-10 MED ORDER — LEVOTHYROXINE SODIUM 100 MCG/5ML IV SOLN
66.0000 ug | Freq: Every day | INTRAVENOUS | Status: DC
  Administered 2023-05-11 – 2023-05-13 (×3): 66 ug via INTRAVENOUS
  Filled 2023-05-10 (×3): qty 5

## 2023-05-10 MED ORDER — PANTOPRAZOLE SODIUM 40 MG IV SOLR
40.0000 mg | Freq: Every day | INTRAVENOUS | Status: DC
  Administered 2023-05-10 – 2023-05-13 (×4): 40 mg via INTRAVENOUS
  Filled 2023-05-10 (×4): qty 10

## 2023-05-10 MED ORDER — SODIUM CHLORIDE 0.9 % IV SOLN
100.0000 mg | Freq: Every day | INTRAVENOUS | Status: DC
  Administered 2023-05-10 – 2023-05-13 (×4): 100 mg via INTRAVENOUS
  Filled 2023-05-10 (×5): qty 10

## 2023-05-10 NOTE — TOC Progression Note (Signed)
Transition of Care [facility]) - Progression Note    Patient Details  Name: [name] MRN: [id number] Date of Birth: 1966/04/14  [facility]) CM/SW Contact  Mearl Latin, LCSW Phone Number: 05/10/2023, 12:47 PM  Clinical Narrative:    CSW met with patient's father. He stated that the MD told him the patient may require Hospice care and inquired with CSW what the options would be for that at a facility or at [facility]. CSW explained that Palliative would meet with him to determine which care level the patient requires as there are qualifications needed for a Hospice facility.   CSW inquired with admissions if patient could have Hospice follow at [facility] but they stated patient would come in with palliative care and can transition to Hospice once he is there with his insurance. CSW will follow for recommendations.    Expected Discharge Plan: Skilled Nursing Facility Barriers to Discharge: Awaiting State Approval (PASRR), Continued Medical Work up, SNF Pending bed offer  Expected Discharge Plan and Services In-house Referral: Clinical Social Work   Post Acute Care Choice: Skilled Nursing Facility Living arrangements for the past 2 months: Group Home                                       Social Determinants of Health (SDOH) Interventions SDOH Screenings   Food Insecurity: Patient Unable To Answer (05/02/2023)  Housing: High Risk (05/02/2023)  Transportation Needs: Patient Unable To Answer (05/02/2023)  Utilities: Patient Unable To Answer (05/02/2023)  Social Connections: Unknown (05/28/2022)   [facility]  Tobacco Use: Low Risk  (05/02/2023)    Readmission Risk Interventions    02/18/2021    2:09 PM  Readmission Risk Prevention Plan  Transportation Screening Complete  PCP or Specialist Appt within 5-7 Days Complete  Home Care Screening Complete  Medication Review (RN CM) Complete

## 2023-05-10 NOTE — Progress Notes (Signed)
PROGRESS NOTE                                                                                                                                                                                                             Patient Demographics:    [name], is a 57 y.o. male, DOB - 18-Feb-1966, UJW:[id number]  Outpatient Primary MD for the patient is [name], [name]    LOS - 8  Admit date - 05/02/2023    Chief Complaint  Patient presents with   Altered Mental Status       Brief Narrative (HPI from H&P)    57 y.o. male,  Down syndrome, apparently nonverbal at baseline, recurrent falls, seizures, hypothyroidism, hypertension who lives in a group home comes into the hospital with altered mental status.  Patient is brought in by his father, apparently patient at baseline is mildly drowsy and usually is alert 10 to 12 hours a day however for the last 2 to 3 weeks he has been increasingly more drowsy not eating or drinking well, finally they brought him to the ER today where he was diagnosed with mild leukocytosis, severe dehydration and AKI.  Possible early sepsis could not be ruled out although he is not febrile, chest x-ray and abdominal x-ray are stable. Patient himself is extremely somnolent and unable to provide any history.    Subjective:    [name] today in bed appears comfortable, nonverbal at baseline, however more awake than  2 days ago   Assessment  & Plan :    1.  Gradually progressive metabolic encephalopathy on top of chronic baseline encephalopathy due to underlying Down syndrome, causing decreased oral intake over the last 2 to 3 weeks, dehydration and AKI.  Clinically sepsis has been ruled out.  Now has signs of aspiration pneumonia on 05/09/2023, underlying dysphagia which was present upon admission and is chronic.   Patient had similar presentation according to father few weeks ago secondary to  [facility], ever we have confirmed that he was not taking on feet lately, EEG does not show any acute seizures, CT head unremarkable, with hydration mental status is improving of note he is nonverbal at baseline. Currently on Vimpat for his underlying seizures.  Case discussed with neurologist Dr. [name] on 05/04/2023, no change in treatment plan.  He had improved with supportive  care unfortunately he had fever again night of 05/09/2023, per father coughing whenever he eats, speech therapy has seen the patient, he is currently on dysphagia 3 diet but still having signs and symptoms of microaspiration.  Had long discussion with patient's father.  Trial of IV antibiotics, palliative care input.  Family is very reasonable and open to comfort measures if patient declines further.  Follow cultures.  Likely discharge to SNF with palliative care in the next 2 to 3 days.    2.  Mild UTI.  Chest x-ray and KUB stable, borderline, finish 5 days of antibiotics, no sepsis clinically.  Continue to monitor cultures, 1 out of 2 blood cultures likely contamination with Staph epidermidis.  Now has microaspiration related pneumonia on 05/09/2023.  Please see above.   3.  Severe dehydration and AKI due to poor oral intake over the last 2 to 3 weeks.  Hydrate, continue IV fluids another day.  Hold ACE inhibitor.   4.  Hypothyroidism.  Home dose Synthroid.  Stable TSH.   5.  Hypertension.  Hold ACE inhibitor due to AKI as needed IV hydralazine.   6.  Mild rhabdo and asymptomatic transaminitis.  Improved with IV fluids.  7.  Dyslipidemia.  On statin.  8.  Hypomagnesemia, hypophosphatemia.  Replaced.       Condition -fair  Family Communication  : Father and mother at the time of admission.  Mother bedside on 05/05/2023  Long discussion with patient's mother who is power of attorney and father, treat with IV antibiotics, IV fluids and medications, if he continues to decline then DNR, no major heroics.  Long discussion with  patient's father bedside on 05/10/2023.  Had discussion with patient's dad bedside on 05/06/2023 he is aware that patient is not eating or drinking on a consistent basis, this could be progression of his underlying condition.  Long-term prognosis remains poor.  Code Status : DNR  Consults  : Neurology, palliative care  PUD Prophylaxis : PPI   Procedures  :     Right upper quadrant ultrasound.  Possible mild fatty liver.    CT head.  Nonacute.    EEG This study is consistent with patient's known history of epilepsy with generalized onset.There is also moderate diffuse encephalopathy. No seizures were seen throughout the recording       Disposition Plan  :    Status is: Inpatient   DVT Prophylaxis  :    heparin injection 5,000 Units Start: 05/02/23 1600   Lab Results  Component Value Date   PLT 302 05/10/2023    Diet :  Diet Order             DIET DYS 3 Room service appropriate? Yes; Fluid consistency: Thin  Diet effective now                    Inpatient Medications  Scheduled Meds:  atorvastatin  10 mg Oral QHS   heparin  5,000 Units Subcutaneous Q8H   levothyroxine  88 mcg Oral Q0600   pantoprazole (PROTONIX) IV  40 mg Intravenous Q0600   Continuous Infusions:  ampicillin-sulbactam (UNASYN) IV 3 g (05/10/23 0517)   lacosamide (VIMPAT) IV     And   lacosamide (VIMPAT) IV     PRN Meds:.acetaminophen **OR** acetaminophen, bisacodyl, hydrALAZINE, ipratropium-albuterol, LORazepam, metoprolol tartrate, [DISCONTINUED] ondansetron **OR** ondansetron (ZOFRAN) IV, senna-docusate, traZODone  Antibiotics  :    Anti-infectives (From admission, onward)    Start     Dose/Rate Route  Frequency Ordered Stop   05/09/23 1800  Ampicillin-Sulbactam (UNASYN) 3 g in sodium chloride 0.9 % 100 mL IVPB        3 g 200 mL/hr over 30 Minutes Intravenous Every 6 hours 05/09/23 1705     05/02/23 1600  cefTRIAXone (ROCEPHIN) 1 g in sodium chloride 0.9 % 100 mL IVPB        1  g 200 mL/hr over 30 Minutes Intravenous Every 24 hours 05/02/23 1533 05/06/23 1709   05/02/23 1600  azithromycin (ZITHROMAX) 500 mg in sodium chloride 0.9 % 250 mL IVPB        500 mg 250 mL/hr over 60 Minutes Intravenous Every 24 hours 05/02/23 1533 05/06/23 1930         Objective:   Vitals:   05/09/23 1954 05/10/23 0000 05/10/23 0427 05/10/23 0800  BP: 124/64 133/70 127/73 (!) 104/56  Pulse: 65 63 63 (!) 57  Resp: 15 17 16 13   Temp: 98 F (36.7 C) 99.2 F (37.3 C) 97.8 F (36.6 C) 98.2 F (36.8 C)  TempSrc: Axillary Axillary Axillary Axillary  SpO2: 100% 100% 100% 100%  Weight:      Height:        Wt Readings from Last 3 Encounters:  05/02/23 74.8 kg  09/24/22 74.8 kg  07/01/22 77.1 kg     Intake/Output Summary (Last 24 hours) at 05/10/2023 1007 Last data filed at 05/10/2023 0432 Gross per 24 hour  Intake --  Output 400 ml  Net -400 ml     Physical Exam  Awake but nonverbal at baseline, more awake than he was in the ER , will nod his head to few questions, per mother bedside now very close to his baseline [place].AT,PERRAL Supple Neck, No JVD,   Symmetrical Chest wall movement, Good air movement bilaterally, CTAB RRR,No Gallops,Rubs or new Murmurs,  +ve B.Sounds, Abd Soft, No tenderness,   No Cyanosis, Clubbing or edema       Data Review:    Recent Labs  Lab 05/04/23 0458 05/05/23 1509 05/06/23 0435 05/07/23 0424 05/10/23 0421  WBC 6.0 5.9 5.9 7.1 5.7  HGB 10.3* 12.5* 12.2* 11.5* 9.8*  HCT 30.1* 37.0* 35.1* 33.1* 29.1*  PLT 199 246 237 232 302  MCV 99.7 98.7 98.9 99.1 100.7*  MCH 34.1* 33.3 34.4* 34.4* 33.9  MCHC 34.2 33.8 34.8 34.7 33.7  RDW 12.8 12.6 12.6 12.7 13.2  LYMPHSABS 1.4 1.6 1.8 1.5 1.6  MONOABS 0.5 0.5 0.5 0.5 0.6  EOSABS 0.1 0.1 0.2 0.1 0.1  BASOSABS 0.0 0.1 0.1 0.0 0.0    Recent Labs  Lab 05/03/23 1104 05/04/23 0458 05/05/23 1509 05/06/23 0435 05/07/23 0424 05/07/23 0952 05/10/23 0421  NA  --  136 135 135 134*  --  134*   K  --  3.9 4.3 4.8 4.1  --  3.7  CL  --  105 103 104 103  --  104  CO2  --  21* 23 23 22   --  23  ANIONGAP  --  10 9 8 9   --  7  GLUCOSE  --  107* 103* 100* 105*  --  103*  BUN  --  31* 17 15 12   --  13  CREATININE  --  0.95 0.96 0.98 0.88  --  0.64  AST  --  169* 77* 60* 42*  --   --   ALT  --  197* 141* 105* 76*  --   --   ALKPHOS  --  53 76 70 66  --   --   BILITOT  --  0.8 0.7 0.8 0.6  --   --   ALBUMIN  --  <1.5* 1.7* 1.6* 1.6*  --   --   CRP  --  6.0*  --   --   --   --  6.1*  PROCALCITON  --  0.28  --   --   --   --  <0.10  AMMONIA <10  --   --   --   --   --   --   MG  --  1.6* 1.7 1.6*  --  1.7  --   CALCIUM  --  7.7* 7.8* 7.6* 7.6*  --  7.6*      Recent Labs  Lab 05/03/23 1104 05/04/23 0458 05/05/23 1509 05/06/23 0435 05/07/23 0424 05/07/23 0952 05/10/23 0421  CRP  --  6.0*  --   --   --   --  6.1*  PROCALCITON  --  0.28  --   --   --   --  <0.10  AMMONIA <10  --   --   --   --   --   --   MG  --  1.6* 1.7 1.6*  --  1.7  --   CALCIUM  --  7.7* 7.8* 7.6* 7.6*  --  7.6*   Radiology Reports DG Chest Port 1 View  Result Date: 05/10/2023 CLINICAL DATA:  Shortness of breath EXAM: PORTABLE CHEST 1 VIEW COMPARISON:  Yesterday FINDINGS: Prominent markings at the lung bases, especially on the right. No edema, effusion, or visible pneumothorax, the chin overlaps the apices. Stable heart size and mediastinal contours. IMPRESSION: Accentuated marking at the lung bases, especially on the right, possible bronchopneumonia. Electronically Signed   By: Tiburcio Pea M.D.   On: 05/10/2023 06:53   DG Chest Port 1 View  Result Date: 05/09/2023 CLINICAL DATA:  [id number] SOB (shortness of breath) [id number] EXAM: PORTABLE CHEST 1 VIEW COMPARISON:  05/02/2023 chest radiograph. FINDINGS: Stable cardiomediastinal silhouette with normal heart size. No pneumothorax. Small right pleural effusion. No left pleural effusion. No overt pulmonary edema. Stable linear upper right lung scar. Mild  streaky right lung base scarring versus atelectasis. IMPRESSION: Small right pleural effusion. Mild streaky right lung base scarring versus atelectasis. Electronically Signed   By: Delbert Phenix M.D.   On: 05/09/2023 18:32      Signature  -   Susa Raring M.D on 05/10/2023 at 10:07 AM   -  To page go to [email or web address]

## 2023-05-10 NOTE — Plan of Care (Signed)
error 

## 2023-05-10 NOTE — Progress Notes (Signed)
  Patient has respire reported that patient is unable to swallow and has aspiration risk. - Keeping patient NPO.  Unable to give the oral loperamide at nighttime.

## 2023-05-10 NOTE — Plan of Care (Signed)
  Problem: Education: Goal: Knowledge of General Education information will improve Description: Including pain rating scale, medication(s)/side effects and non-pharmacologic comfort measures Outcome: Progressing   Problem: Clinical Measurements: Goal: Will remain free from infection Outcome: Progressing Goal: Diagnostic test results will improve Outcome: Progressing Goal: Cardiovascular complication will be avoided Outcome: Progressing   Problem: Nutrition: Goal: Adequate nutrition will be maintained Outcome: Progressing   

## 2023-05-11 DIAGNOSIS — N179 Acute kidney failure, unspecified: Secondary | ICD-10-CM | POA: Diagnosis not present

## 2023-05-11 DIAGNOSIS — Z515 Encounter for palliative care: Secondary | ICD-10-CM

## 2023-05-11 DIAGNOSIS — R627 Adult failure to thrive: Secondary | ICD-10-CM | POA: Diagnosis not present

## 2023-05-11 DIAGNOSIS — Q909 Down syndrome, unspecified: Secondary | ICD-10-CM | POA: Diagnosis not present

## 2023-05-11 DIAGNOSIS — R638 Other symptoms and signs concerning food and fluid intake: Secondary | ICD-10-CM | POA: Diagnosis not present

## 2023-05-11 DIAGNOSIS — G40909 Epilepsy, unspecified, not intractable, without status epilepticus: Secondary | ICD-10-CM

## 2023-05-11 DIAGNOSIS — E870 Hyperosmolality and hypernatremia: Secondary | ICD-10-CM

## 2023-05-11 LAB — CBC WITH DIFFERENTIAL/PLATELET
Abs Immature Granulocytes: 0.02 10*3/uL (ref 0.00–0.07)
Basophils Absolute: 0 10*3/uL (ref 0.0–0.1)
Basophils Relative: 1 %
Eosinophils Absolute: 0 10*3/uL (ref 0.0–0.5)
Eosinophils Relative: 1 %
HCT: 30.7 % — ABNORMAL LOW (ref 39.0–52.0)
Hemoglobin: 10.4 g/dL — ABNORMAL LOW (ref 13.0–17.0)
Immature Granulocytes: 0 %
Lymphocytes Relative: 26 %
Lymphs Abs: 1.3 10*3/uL (ref 0.7–4.0)
MCH: 34.1 pg — ABNORMAL HIGH (ref 26.0–34.0)
MCHC: 33.9 g/dL (ref 30.0–36.0)
MCV: 100.7 fL — ABNORMAL HIGH (ref 80.0–100.0)
Monocytes Absolute: 0.6 10*3/uL (ref 0.1–1.0)
Monocytes Relative: 11 %
Neutro Abs: 3 10*3/uL (ref 1.7–7.7)
Neutrophils Relative %: 61 %
Platelets: 302 10*3/uL (ref 150–400)
RBC: 3.05 MIL/uL — ABNORMAL LOW (ref 4.22–5.81)
RDW: 13.4 % (ref 11.5–15.5)
WBC: 5 10*3/uL (ref 4.0–10.5)
nRBC: 0 % (ref 0.0–0.2)

## 2023-05-11 LAB — BASIC METABOLIC PANEL
Anion gap: 7 (ref 5–15)
BUN: 11 mg/dL (ref 6–20)
CO2: 23 mmol/L (ref 22–32)
Calcium: 7.8 mg/dL — ABNORMAL LOW (ref 8.9–10.3)
Chloride: 102 mmol/L (ref 98–111)
Creatinine, Ser: 0.79 mg/dL (ref 0.61–1.24)
GFR, Estimated: >60 mL/min (ref >60)
Glucose, Bld: 88 mg/dL (ref 70–99)
Potassium: 3.8 mmol/L (ref 3.5–5.1)
Sodium: 132 mmol/L — ABNORMAL LOW (ref 135–145)

## 2023-05-11 LAB — C-REACTIVE PROTEIN: CRP: 7.6 mg/dL — ABNORMAL HIGH (ref <1.0)

## 2023-05-11 LAB — PROCALCITONIN: Procalcitonin: <0.1 ng/mL

## 2023-05-11 MED ORDER — DEXTROSE-SODIUM CHLORIDE 5-0.45 % IV SOLN: INTRAVENOUS | Status: AC

## 2023-05-11 NOTE — Plan of Care (Signed)
  Problem: Education: Goal: Knowledge of General Education information will improve Description: Including pain rating scale, medication(s)/side effects and non-pharmacologic comfort measures Outcome: Progressing   Problem: Health Behavior/Discharge Planning: Goal: Ability to manage health-related needs will improve Outcome: Progressing   Problem: Clinical Measurements: Goal: Ability to maintain clinical measurements within normal limits will improve Outcome: Progressing Goal: Will remain free from infection Outcome: Progressing Goal: Diagnostic test results will improve Outcome: Progressing Goal: Respiratory complications will improve Outcome: Progressing Goal: Cardiovascular complication will be avoided Outcome: Progressing   Problem: Activity: Goal: Risk for activity intolerance will decrease Outcome: Progressing   Problem: Nutrition: Goal: Adequate nutrition will be maintained Outcome: Progressing   Problem: Coping: Goal: Level of anxiety will decrease Outcome: Progressing   Problem: Elimination: Goal: Will not experience complications related to bowel motility Outcome: Progressing Goal: Will not experience complications related to urinary retention Outcome: Progressing   Problem: Pain Management: Goal: General experience of comfort will improve Outcome: Progressing   Problem: Safety: Goal: Ability to remain free from injury will improve Outcome: Progressing   Problem: Skin Integrity: Goal: Risk for impaired skin integrity will decrease Outcome: Progressing   Problem: Nutrition Goal: Patient maintains adequate hydration Outcome: Progressing Goal: Patient maintains weight Outcome: Progressing Goal: Patient/Family demonstrates understanding of diet Outcome: Progressing Goal: Patient/Family independently completes tube feeding Outcome: Progressing Goal: Patient will have no more than 5 lb weight change during LOS Outcome: Progressing Goal: Patient will  utilize adaptive techniques to administer nutrition Outcome: Progressing Goal: Patient will verbalize dietary restrictions Outcome: Progressing

## 2023-05-11 NOTE — Consult Note (Signed)
Consultation Note Date: 05/11/2023   Patient Name: [name]  DOB: [birth year]  MRN: [id number]  Age / Sex: 57 y.o., male  PCP: [name], [name] Referring Physician: Starleen Arms, MD  Reason for Consultation: Establishing goals of care  HPI/Patient Profile: 57 y.o. male   admitted on 05/02/2023 with  Down syndrome, apparently nonverbal at baseline, recurrent falls, seizures, hypothyroidism, hypertension who lives in a group home comes into the hospital with altered mental status.  Patient is brought in by his father, apparently patient at baseline is mildly drowsy and usually is alert 10 to 12 hours a day however for the last 2 to 3 weeks he has been increasingly more drowsy not eating or drinking well, finally they brought him to the ER today where he was diagnosed with mild leukocytosis, severe dehydration and AKI.  Possible early sepsis could not be ruled out although he is not febrile, chest x-ray and abdominal x-ray are stable. Patient himself is extremely somnolent and unable to provide any history.   Today is day 9 of this hospitalization, he continues to ooze to have progressive encephalopathy, he remains high risk for aspiration he is n.p.o. at this time.  Patient has lived in a group home for the past 25 years.   He has 5 siblings.   Family face treatment option decisions, advanced directive decisions and anticipatory care needs.  Clinical Assessment and Goals of Care:  This NP Lorinda Creed reviewed medical records, received report from team, assessed the patient and then meet at the patient's bedside along with his father to discuss diagnosis, prognosis, GOC, EOL wishes disposition and options.   Concept of Palliative Care was introduced as specialized medical care for people and their families living with serious illness.  If focuses on providing relief from the symptoms and stress of  a serious illness.  The goal is to improve quality of life for both the patient and the family.  Values and goals of care important to patient and family were attempted to be elicited.   A  discussion was had today regarding advanced directives.  Concepts specific to code status, artifical feeding and hydration, continued IV antibiotics and rehospitalization was had.    The difference between a aggressive medical intervention path  and a palliative comfort care path for this patient at this time was had.     Education offered on comfort feeds in a patient with aspiration risk  MOST form introduced and a Hard Choices booklet left for review.         After conversation with patient's father he asked that I speak with patient's mother by telephone.  She is legal guardian. All above concepts again reviewed with patient's mother.   Natural trajectory and expectations at EOL were discussed.    Questions and concerns addressed.  Family   encouraged to call with questions or concerns.     PMT will continue to support holistically.          Parents understand the seriousness of  patient's current medical situation.  Ultimately comfort is the priority for patient.  However at this time they wish to discuss end-of-life wishes and decisions with their other 5 children for making definitive end-of-life decisions regarding treatment plan and transition of care placement.   Education offered on hospice benefit; philosophy and eligibility. Education offered on hospice care at a residential hospice.  Parents are familiar with [facility]  At this point in time family wish to continue with current medical support; antibiotics and fluids--continue to treat the treatable.    They plan to speak together tonight as a family and patient's father will be at the bedside tomorrow morning around 11 AM and is open to meeting with PMT for further clarification of goals of care   NEXT OF KIN    SUMMARY OF  RECOMMENDATIONS    Code Status/Advance Care Planning: Limited code   Symptom Management:  Education offered on utilization of medications for management of end-of-life symptoms.  Palliative Prophylaxis:  Aspiration, Bowel Regimen, Delirium Protocol, Frequent Pain Assessment, and Oral Care  Additional Recommendations (Limitations, Scope, Preferences): No Artificial Feeding  Psycho-social/Spiritual:  Desire for further Chaplaincy support:no Additional Recommendations: Education on Hospice  Prognosis:  Long-term poor prognosis  Discharge Planning:   Conversation had previously with transition of care regarding need for skilled nursing facility for long-term care.  However at this time if family makes decision to shift to full comfort they will be interested in pursuing hospice facility for end-of-life care      Primary Diagnoses: Present on Admission:  AKI (acute kidney injury) (HCC)  Obstructive sleep apnea   I have reviewed the medical record, interviewed the patient and family, and examined the patient. The following aspects are pertinent.  Past Medical History:  Diagnosis Date   Down's syndrome    GERD (gastroesophageal reflux disease)    Hypertension    Hypothyroid    Lives in group home    [facility]-[phone]-[phone]-Roxanne-nurse   Nystagmus    Obstructive sleep apnea (adult) (pediatric)    does use a cpap at night   Seizure disorder (HCC) 10/28/2016   Seizures (HCC)    Social History   Socioeconomic History   Marital status: Single    Spouse name: Not on file   Number of children: 0   Years of education: 12   Highest education level: Not on file  Occupational History   Occupation: [employer]  Tobacco Use   Smoking status: Never   Smokeless tobacco: Never  Vaping Use   Vaping status: Never Used  Substance and Sexual Activity   Alcohol use: No   Drug use: No   Sexual activity: Not Currently    Birth control/protection: None  Other  Topics Concern   Not on file  Social History Narrative   Lives at a group home   Caffeine use: Tea/soda once per week   Right handed   Social Determinants of Health   Financial Resource Strain: Not on file  Food Insecurity: Patient Unable To Answer (05/02/2023)   Hunger Vital Sign    Worried About Running Out of Food in the Last Year: Patient unable to answer    Ran Out of Food in the Last Year: Patient unable to answer  Transportation Needs: Patient Unable To Answer (05/02/2023)   PRAPARE - Transportation    Lack of Transportation (Medical): Patient unable to answer    Lack of Transportation (Non-Medical): Patient unable to answer  Physical Activity: Not on file  Stress: Not  on file  Social Connections: Unknown (05/28/2022)   Received from [facility]   Social Network    Social Network: Not on file   Family History  Problem Relation Age of Onset   Dementia Maternal Grandmother    Diabetes Paternal Grandfather    Heart failure Paternal Grandfather    Hypertension Paternal Grandfather    Seizures Neg Hx    Scheduled Meds:  atorvastatin  10 mg Oral QHS   heparin  5,000 Units Subcutaneous Q8H   levothyroxine  66 mcg Intravenous Daily   pantoprazole (PROTONIX) IV  40 mg Intravenous Q0600   Continuous Infusions:  ampicillin-sulbactam (UNASYN) IV 3 g (05/11/23 0518)   lacosamide (VIMPAT) IV 100 mg (05/10/23 1059)   And   lacosamide (VIMPAT) IV 200 mg (05/10/23 2229)   PRN Meds:.acetaminophen **OR** acetaminophen, bisacodyl, hydrALAZINE, ipratropium-albuterol, LORazepam, metoprolol tartrate, [DISCONTINUED] ondansetron **OR** ondansetron (ZOFRAN) IV, senna-docusate, traZODone Medications Prior to Admission:  Prior to Admission medications   Medication Sig Start Date End Date Taking? Authorizing Provider  acetaminophen (TYLENOL) 160 MG/5ML solution Take 20 mLs by mouth every 4 (four) hours as needed for fever or moderate pain (pain score 4-6).   Yes [provider]  Alum & Mag Hydroxide-Simeth (ANTACID LIQUID PO) Take 15 mLs by mouth See admin instructions. Take 15 ml by mouth every 2 hours as needed if less than 100 lbs, take 30 ml by mouth every 2 hours if greater than 100 lbs for indigestion/heartburn/upset stomach.   Yes [provider]  Artificial Tear Ointment (LACRI-LUBE OP) Place 1 application into both eyes at bedtime.   Yes [provider]  atorvastatin (LIPITOR) 10 MG tablet Take 10 mg by mouth at bedtime.   Yes [provider]  benazepril (LOTENSIN) 10 MG tablet Take 10 mg by mouth at bedtime.   Yes [provider]  calcitonin, salmon, (MIACALCIN/FORTICAL) 200 UNIT/ACT nasal spray Place 1 spray into alternate nostrils daily.   Yes [provider]  Cholecalciferol (VITAMIN D3) 50 MCG (2000 UT) TABS Take 2,000 Units by mouth every morning.   Yes [provider]  diphenhydrAMINE (BENADRYL) 12.5 MG/5ML elixir Take 10 mLs by mouth every 4 (four) hours as needed for allergies or itching.   Yes [provider]  doxycycline (VIBRAMYCIN) 50 MG capsule Take 50 mg by mouth daily.   Yes [provider]  Emollient (CERAVE DAILY MOISTURIZING) LOTN Apply 1 Application topically as needed (dry skin).   Yes [provider]  guaiFENesin (ROBITUSSIN) 100 MG/5ML liquid Take 5 mLs by mouth every 4 (four) hours as needed for cough or to loosen phlegm.   Yes [provider]  Infant Care Products (JOHNSONS BABY SHAMPOO) SHAM Apply 1 application  topically See admin instructions. Mix 1/2 water and 1/2 shampoo - use to scrub eyebrows and eyelids every morning   Yes [provider]  Lacosamide 100 MG TABS Take 100-200 mg by mouth See admin instructions. Take 1 tablet by mouth every morning. take 2 tablets by mouth every evening.   Yes [provider]  lactulose (CHRONULAC) 10 GM/15ML solution Take 10 g by mouth every morning.   Yes [provider]   levothyroxine (SYNTHROID) 88 MCG tablet Take 88 mcg by mouth every morning. 01/16/21  Yes [provider]  loperamide (IMODIUM A-D) 2 MG tablet Take 4 mg by mouth as needed for diarrhea or loose stools.   Yes [provider]  magnesium hydroxide (MILK OF MAGNESIA) 400 MG/5ML  suspension Take 30 mLs by mouth once as needed for mild constipation. For Constipation Step 2: If no results from Step 1, take 30 ml by mouth for 1 dose if greater than 100 lbs; give 15 ml by mouth if less than 100 lbs.   Yes [provider]  mupirocin ointment (BACTROBAN) 2 % Apply 1 Application topically once as needed. 04/12/23  Yes [provider]  Omega-3 Fatty Acids (FISH OIL) 500 MG CAPS Take 1,500 mg by mouth every morning.   Yes [provider]  promethazine (PHENERGAN) 25 MG tablet Take 25 mg by mouth every 4 (four) hours as needed for nausea or vomiting.   Yes [provider]  Soap & Cleansers (PHISODERM EX) Apply 1 application topically 2 (two) times daily. Apply to face   Yes [provider]  Vitamins A & D (VITAMIN A & D) ointment Apply 1 Application topically 3 (three) times daily.   Yes [provider]  cloBAZam (ONFI) 10 MG tablet Take 0.5 tablets (5 mg total) by mouth at bedtime. Patient not taking: Reported on 05/02/2023 02/10/23   [name], NP   Allergies  Allergen Reactions   Clonidine Derivatives Other (See Comments)    Causes symptomatic bradycardia with syncope and hypotension   Review of Systems  Unable to perform ROS: Patient nonverbal    Physical Exam Constitutional:      Appearance: He is normal weight. He is ill-appearing.     Interventions: Nasal cannula in place.  Cardiovascular:     Rate and Rhythm: Normal rate.  Pulmonary:     Effort: Pulmonary effort is normal.  Musculoskeletal:     Comments: Neurolyse weakness and muscle atrophy  Skin:    General: Skin is warm and dry.  Neurological:     Mental Status:  He is lethargic.     Vital Signs: BP 122/65 (BP Location: Left Arm)   Pulse 73   Temp 98.3 F (36.8 C) (Axillary)   Resp 14   Ht 5' (1.524 m)   Wt 74.8 kg   SpO2 100%   BMI 32.21 kg/m  Pain Scale: Faces   Pain Score: Asleep   SpO2: SpO2: 100 % O2 Device:SpO2: 100 % O2 Flow Rate: .O2 Flow Rate (L/min): 2 L/min  IO: Intake/output summary:  Intake/Output Summary (Last 24 hours) at 05/11/2023 0736 Last data filed at 05/11/2023 0518 Gross per 24 hour  Intake --  Output 1200 ml  Net -1200 ml    LBM: Last BM Date : 05/06/23 Baseline Weight: Weight: 74.8 kg Most recent weight: Weight: 74.8 kg     Palliative Assessment/Data:  30% at best    Time:  105 minutes  Discussed with Dr. Randol Kern   Signed by: Lorinda Creed, NP   Please contact Palliative Medicine Team phone at [phone] for questions and concerns.  For individual provider: See Loretha Stapler

## 2023-05-11 NOTE — Progress Notes (Signed)
PROGRESS NOTE                                                                                                                                                                                                             Patient Demographics:    [name], is a 57 y.o. male, DOB - 1966-02-18, FIE:[id number]  Outpatient Primary MD for the patient is Lucretia Field    LOS - 9  Admit date - 05/02/2023    Chief Complaint  Patient presents with   Altered Mental Status       Brief Narrative (HPI from H&P)     57 y.o. male,  Down syndrome, apparently nonverbal at baseline, recurrent falls, seizures, hypothyroidism, hypertension who lives in a group home comes into the hospital with altered mental status.  Patient is brought in by his father, apparently patient at baseline is mildly drowsy and usually is alert 10 to 12 hours a day however for the last 2 to 3 weeks he has been increasingly more drowsy not eating or drinking well, finally they brought him to the ER today where he was diagnosed with mild leukocytosis, severe dehydration and AKI.  Possible early sepsis could not be ruled out although he is not febrile, chest x-ray and abdominal x-ray are stable. Patient himself is extremely somnolent and unable to provide any history.    Subjective:    [name] today in bed appears comfortable, nonverbal at baseline,   Assessment  & Plan :    Acute metabolic encephalopathy with underlying significant dementia in a patient with Down syndrome's  -Patient continues to have progressive decline as an outpatient, currently worsened most likely in the setting of dehydration, progressive dementia and infectious process -  Patient had similar presentation according to father few weeks ago secondary to [facility], ever we have confirmed that he was not taking on feet lately, EEG does not show any acute seizures, CT head unremarkable,  with hydration mental status is improving of note he is nonverbal at baseline. Currently on Vimpat for his underlying seizures.  Case discussed with neurologist Dr. [name] on 05/04/2023, no change in treatment plan, patient close to his baseline continue present regimen. -Remains to be significantly encephalopathic, poor oral intake, significant aspiration risk as well. - patient continues to have progressive encephalopathy, he is high risk for  aspiration, and unsafe to swallow at this point.  He is with evidence of pneumonia at this point, empirically on antibiotics.  UTI.    Chest x-ray and KUB stable, borderline, finish 5 days of antibiotics, no sepsis clinically.  Continue to monitor cultures, 1 out of 2 blood cultures likely contamination with Staph epidermidis.   Severe dehydration  Lactic acidosis  AKI  Hypotension  - due to poor oral intake over the last 2 to 3 weeks.  Hydrate, continue IV fluids another day.  Hold ACE inhibitor.   Hypothyroidism -  Home dose Synthroid.  Stable TSH.   Hypertension.   - Hold ACE inhibitor due to AKI as needed IV hydralazine.   Mild rhabdo and asymptomatic transaminitis.   -Improved with IV fluids.  Dyslipidemia.   -On statin.  Hypomagnesemia hypophosphatemia.   Replaced.       Condition -fair  Family Communication  : None at bedside on my evaluation today, palliative medicine discussed with father at bedside and mother by phone.  Code Status : DNR  Consults  : Neurology  PUD Prophylaxis : PPI   Procedures  :     Right upper quadrant ultrasound.  Possible mild fatty liver.    CT head.  Nonacute.    EEG This study is consistent with patient's known history of epilepsy with generalized onset.There is also moderate diffuse encephalopathy. No seizures were seen throughout the recording       Disposition Plan  :    Status is: Inpatient   DVT Prophylaxis  :    heparin injection 5,000 Units Start: 05/02/23 1600   Lab Results   Component Value Date   PLT 302 05/11/2023    Diet :  Diet Order             Diet NPO time specified  Diet effective now                    Inpatient Medications  Scheduled Meds:  atorvastatin  10 mg Oral QHS   heparin  5,000 Units Subcutaneous Q8H   levothyroxine  66 mcg Intravenous Daily   pantoprazole (PROTONIX) IV  40 mg Intravenous Q0600   Continuous Infusions:  ampicillin-sulbactam (UNASYN) IV 3 g (05/11/23 1238)   dextrose 5 % and 0.45 % NaCl     lacosamide (VIMPAT) IV 100 mg (05/11/23 0955)   And   lacosamide (VIMPAT) IV 200 mg (05/10/23 2229)   PRN Meds:.acetaminophen **OR** acetaminophen, bisacodyl, hydrALAZINE, ipratropium-albuterol, LORazepam, metoprolol tartrate, [DISCONTINUED] ondansetron **OR** ondansetron (ZOFRAN) IV, senna-docusate, traZODone  Antibiotics  :    Anti-infectives (From admission, onward)    Start     Dose/Rate Route Frequency Ordered Stop   05/09/23 1800  Ampicillin-Sulbactam (UNASYN) 3 g in sodium chloride 0.9 % 100 mL IVPB        3 g 200 mL/hr over 30 Minutes Intravenous Every 6 hours 05/09/23 1705 05/13/23 2359   05/02/23 1600  cefTRIAXone (ROCEPHIN) 1 g in sodium chloride 0.9 % 100 mL IVPB        1 g 200 mL/hr over 30 Minutes Intravenous Every 24 hours 05/02/23 1533 05/06/23 1709   05/02/23 1600  azithromycin (ZITHROMAX) 500 mg in sodium chloride 0.9 % 250 mL IVPB        500 mg 250 mL/hr over 60 Minutes Intravenous Every 24 hours 05/02/23 1533 05/06/23 1930         Objective:   Vitals:   05/11/23 0200 05/11/23 0300 05/11/23 0400  05/11/23 1200  BP:   122/65 110/74  Pulse: 68 74 73 82  Resp: 14 15 14 18   Temp:   98.3 F (36.8 C) 97.8 F (36.6 C)  TempSrc:   Axillary Axillary  SpO2: 100% 100% 100% 100%  Weight:      Height:        Wt Readings from Last 3 Encounters:  05/02/23 74.8 kg  09/24/22 74.8 kg  07/01/22 77.1 kg     Intake/Output Summary (Last 24 hours) at 05/11/2023 1432 Last data filed at 05/11/2023  0518 Gross per 24 hour  Intake --  Output 1200 ml  Net -1200 ml     Physical Exam  Patient is awake, nonverbal at baseline, frail, ill-appearing Symmetrical Chest wall movement, Good air movement bilaterally, CTAB RRR,No Gallops,Rubs or new Murmurs, No Parasternal Heave +ve B.Sounds, Abd Soft, No tenderness, No rebound - guarding or rigidity. No Cyanosis, Clubbing or edema, No new Rash or bruise         Data Review:    Recent Labs  Lab 05/05/23 1509 05/06/23 0435 05/07/23 0424 05/10/23 0421 05/11/23 0517  WBC 5.9 5.9 7.1 5.7 5.0  HGB 12.5* 12.2* 11.5* 9.8* 10.4*  HCT 37.0* 35.1* 33.1* 29.1* 30.7*  PLT 246 237 232 302 302  MCV 98.7 98.9 99.1 100.7* 100.7*  MCH 33.3 34.4* 34.4* 33.9 34.1*  MCHC 33.8 34.8 34.7 33.7 33.9  RDW 12.6 12.6 12.7 13.2 13.4  LYMPHSABS 1.6 1.8 1.5 1.6 1.3  MONOABS 0.5 0.5 0.5 0.6 0.6  EOSABS 0.1 0.2 0.1 0.1 0.0  BASOSABS 0.1 0.1 0.0 0.0 0.0    Recent Labs  Lab 05/05/23 1509 05/06/23 0435 05/07/23 0424 05/07/23 0952 05/10/23 0421 05/11/23 0517  NA 135 135 134*  --  134* 132*  K 4.3 4.8 4.1  --  3.7 3.8  CL 103 104 103  --  104 102  CO2 23 23 22   --  23 23  ANIONGAP 9 8 9   --  7 7  GLUCOSE 103* 100* 105*  --  103* 88  BUN 17 15 12   --  13 11  CREATININE 0.96 0.98 0.88  --  0.64 0.79  AST 77* 60* 42*  --   --   --   ALT 141* 105* 76*  --   --   --   ALKPHOS 76 70 66  --   --   --   BILITOT 0.7 0.8 0.6  --   --   --   ALBUMIN 1.7* 1.6* 1.6*  --   --   --   CRP  --   --   --   --  6.1* 7.6*  PROCALCITON  --   --   --   --  <0.10 <0.10  MG 1.7 1.6*  --  1.7  --   --   CALCIUM 7.8* 7.6* 7.6*  --  7.6* 7.8*      Recent Labs  Lab 05/05/23 1509 05/06/23 0435 05/07/23 0424 05/07/23 0952 05/10/23 0421 05/11/23 0517  CRP  --   --   --   --  6.1* 7.6*  PROCALCITON  --   --   --   --  <0.10 <0.10  MG 1.7 1.6*  --  1.7  --   --   CALCIUM 7.8* 7.6* 7.6*  --  7.6* 7.8*   Radiology Reports DG Chest Port 1 View  Result Date:  05/10/2023 CLINICAL DATA:  Shortness of breath EXAM: PORTABLE  CHEST 1 VIEW COMPARISON:  Yesterday FINDINGS: Prominent markings at the lung bases, especially on the right. No edema, effusion, or visible pneumothorax, the chin overlaps the apices. Stable heart size and mediastinal contours. IMPRESSION: Accentuated marking at the lung bases, especially on the right, possible bronchopneumonia. Electronically Signed   By: Tiburcio Pea M.D.   On: 05/10/2023 06:53   DG Chest Port 1 View  Result Date: 05/09/2023 CLINICAL DATA:  [id number] SOB (shortness of breath) [id number] EXAM: PORTABLE CHEST 1 VIEW COMPARISON:  05/02/2023 chest radiograph. FINDINGS: Stable cardiomediastinal silhouette with normal heart size. No pneumothorax. Small right pleural effusion. No left pleural effusion. No overt pulmonary edema. Stable linear upper right lung scar. Mild streaky right lung base scarring versus atelectasis. IMPRESSION: Small right pleural effusion. Mild streaky right lung base scarring versus atelectasis. Electronically Signed   By: Delbert Phenix M.D.   On: 05/09/2023 18:32      Signature  -   Huey Bienenstock M.D on 05/11/2023 at 2:32 PM   -  To page go to [email or web address]

## 2023-05-12 DIAGNOSIS — N179 Acute kidney failure, unspecified: Secondary | ICD-10-CM | POA: Diagnosis not present

## 2023-05-12 DIAGNOSIS — Z515 Encounter for palliative care: Secondary | ICD-10-CM | POA: Diagnosis not present

## 2023-05-12 DIAGNOSIS — Q909 Down syndrome, unspecified: Secondary | ICD-10-CM | POA: Diagnosis not present

## 2023-05-12 DIAGNOSIS — G40909 Epilepsy, unspecified, not intractable, without status epilepticus: Secondary | ICD-10-CM | POA: Diagnosis not present

## 2023-05-12 DIAGNOSIS — Z7189 Other specified counseling: Secondary | ICD-10-CM

## 2023-05-12 LAB — PROCALCITONIN: Procalcitonin: <0.1 ng/mL

## 2023-05-12 LAB — CBC WITH DIFFERENTIAL/PLATELET
Abs Immature Granulocytes: 0.02 10*3/uL (ref 0.00–0.07)
Basophils Absolute: 0 10*3/uL (ref 0.0–0.1)
Basophils Relative: 1 %
Eosinophils Absolute: 0 10*3/uL (ref 0.0–0.5)
Eosinophils Relative: 1 %
HCT: 30.5 % — ABNORMAL LOW (ref 39.0–52.0)
Hemoglobin: 10.5 g/dL — ABNORMAL LOW (ref 13.0–17.0)
Immature Granulocytes: 1 %
Lymphocytes Relative: 27 %
Lymphs Abs: 1.2 10*3/uL (ref 0.7–4.0)
MCH: 34.3 pg — ABNORMAL HIGH (ref 26.0–34.0)
MCHC: 34.4 g/dL (ref 30.0–36.0)
MCV: 99.7 fL (ref 80.0–100.0)
Monocytes Absolute: 0.5 10*3/uL (ref 0.1–1.0)
Monocytes Relative: 12 %
Neutro Abs: 2.6 10*3/uL (ref 1.7–7.7)
Neutrophils Relative %: 58 %
Platelets: 316 10*3/uL (ref 150–400)
RBC: 3.06 MIL/uL — ABNORMAL LOW (ref 4.22–5.81)
RDW: 13.2 % (ref 11.5–15.5)
WBC: 4.4 10*3/uL (ref 4.0–10.5)
nRBC: 0 % (ref 0.0–0.2)

## 2023-05-12 LAB — BASIC METABOLIC PANEL
Anion gap: 5 (ref 5–15)
BUN: 7 mg/dL (ref 6–20)
CO2: 24 mmol/L (ref 22–32)
Calcium: 7.7 mg/dL — ABNORMAL LOW (ref 8.9–10.3)
Chloride: 106 mmol/L (ref 98–111)
Creatinine, Ser: 0.75 mg/dL (ref 0.61–1.24)
GFR, Estimated: >60 mL/min (ref >60)
Glucose, Bld: 119 mg/dL — ABNORMAL HIGH (ref 70–99)
Potassium: 3.7 mmol/L (ref 3.5–5.1)
Sodium: 135 mmol/L (ref 135–145)

## 2023-05-12 LAB — C-REACTIVE PROTEIN: CRP: 9 mg/dL — ABNORMAL HIGH (ref <1.0)

## 2023-05-12 MED ORDER — GLYCOPYRROLATE 0.2 MG/ML IJ SOLN: 0.2000 mg | INTRAMUSCULAR | Status: DC | PRN

## 2023-05-12 MED ORDER — BIOTENE DRY MOUTH MT LIQD: 15.0000 mL | OROMUCOSAL | Status: DC | PRN

## 2023-05-12 MED ORDER — MIDODRINE HCL 5 MG PO TABS
2.5000 mg | ORAL_TABLET | Freq: Three times a day (TID) | ORAL | Status: DC
  Administered 2023-05-12: 2.5 mg via ORAL
  Filled 2023-05-12: qty 1

## 2023-05-12 MED ORDER — MORPHINE SULFATE (PF) 2 MG/ML IV SOLN: 1.0000 mg | INTRAVENOUS | Status: DC | PRN

## 2023-05-12 MED ORDER — HALOPERIDOL LACTATE 5 MG/ML IJ SOLN: 0.5000 mg | INTRAMUSCULAR | Status: DC | PRN

## 2023-05-12 MED ORDER — MIDODRINE HCL 5 MG PO TABS: 5.0000 mg | ORAL_TABLET | Freq: Three times a day (TID) | ORAL | Status: DC

## 2023-05-12 MED ORDER — POLYVINYL ALCOHOL 1.4 % OP SOLN: 1.0000 [drp] | Freq: Four times a day (QID) | OPHTHALMIC | Status: DC | PRN

## 2023-05-12 NOTE — TOC Progression Note (Signed)
Transition of Care [facility]) - Progression Note    Patient Details  Name: [name] MRN: [id number] Date of Birth: 02/01/1966  [facility] CM/SW Contact  Mearl Latin, LCSW Phone Number: 05/12/2023, 12:15 PM  Clinical Narrative:    CSW received request for Hospice with family preference for [facility] since it is close to patient's parents. His father reported that he has never been in it but knows where it is located. CSW sent referral to [facility] for review.    Expected Discharge Plan: Hospice Medical Facility Barriers to Discharge: Hospice Bed not available  Expected Discharge Plan and Services In-house Referral: Clinical Social Work   Post Acute Care Choice: Skilled Nursing Facility Living arrangements for the past 2 months: Group Home                                       Social Determinants of Health (SDOH) Interventions SDOH Screenings   Food Insecurity: Patient Unable To Answer (05/02/2023)  Housing: High Risk (05/02/2023)  Transportation Needs: Patient Unable To Answer (05/02/2023)  Utilities: Patient Unable To Answer (05/02/2023)  Social Connections: Unknown (05/28/2022)   [facility] from [facility]  Tobacco Use: Low Risk  (05/02/2023)    Readmission Risk Interventions    02/18/2021    2:09 PM  Readmission Risk Prevention Plan  Transportation Screening Complete  PCP or Specialist Appt within 5-7 Days Complete  Home Care Screening Complete  Medication Review (RN CM) Complete

## 2023-05-12 NOTE — Plan of Care (Signed)
  Problem: Education: Goal: Knowledge of General Education information will improve Description: Including pain rating scale, medication(s)/side effects and non-pharmacologic comfort measures Outcome: Progressing   Problem: Health Behavior/Discharge Planning: Goal: Ability to manage health-related needs will improve Outcome: Progressing   Problem: Clinical Measurements: Goal: Ability to maintain clinical measurements within normal limits will improve Outcome: Progressing Goal: Will remain free from infection Outcome: Progressing Goal: Diagnostic test results will improve Outcome: Progressing Goal: Respiratory complications will improve Outcome: Progressing Goal: Cardiovascular complication will be avoided Outcome: Progressing   Problem: Activity: Goal: Risk for activity intolerance will decrease Outcome: Progressing   Problem: Nutrition: Goal: Adequate nutrition will be maintained Outcome: Progressing   Problem: Coping: Goal: Level of anxiety will decrease Outcome: Progressing   Problem: Elimination: Goal: Will not experience complications related to bowel motility Outcome: Progressing Goal: Will not experience complications related to urinary retention Outcome: Progressing   Problem: Pain Management: Goal: General experience of comfort will improve Outcome: Progressing   Problem: Safety: Goal: Ability to remain free from injury will improve Outcome: Progressing   Problem: Skin Integrity: Goal: Risk for impaired skin integrity will decrease Outcome: Progressing   Problem: Nutrition Goal: Patient maintains adequate hydration Outcome: Progressing Goal: Patient maintains weight Outcome: Progressing Goal: Patient/Family demonstrates understanding of diet Outcome: Progressing Goal: Patient/Family independently completes tube feeding Outcome: Progressing Goal: Patient will have no more than 5 lb weight change during LOS Outcome: Progressing Goal: Patient will  utilize adaptive techniques to administer nutrition Outcome: Progressing Goal: Patient will verbalize dietary restrictions Outcome: Progressing

## 2023-05-12 NOTE — Progress Notes (Addendum)
PROGRESS NOTE                                                                                                                                                                                                             Patient Demographics:    [name], is a 57 y.o. male, DOB - 1965/08/18, DGU:[id number]  Outpatient Primary MD for the patient is [name], [name]    LOS - 10  Admit date - 05/02/2023    Chief Complaint  Patient presents with   Altered Mental Status       Brief Narrative (HPI from H&P)     57 y.o. male,  Down syndrome, apparently nonverbal at baseline, recurrent falls, seizures, hypothyroidism, hypertension who lives in a group home comes into the hospital with altered mental status.  Patient is brought in by his father, apparently patient at baseline is mildly drowsy and usually is alert 10 to 12 hours a day however for the last 2 to 3 weeks he has been increasingly more drowsy not eating or drinking well, finally they brought him to the ER today where he was diagnosed with mild leukocytosis, severe dehydration and AKI.  Possible early sepsis could not be ruled out although he is not febrile, chest x-ray and abdominal x-ray are stable. Patient himself is extremely somnolent and unable to provide any history.    Subjective:  No significant events overnight as discussed with staff, he does appear comfortable, unable to provide any complaints.  Assessment  & Plan :    Acute metabolic encephalopathy with underlying significant dementia in a patient with Down syndrome's  -Patient continues to have progressive decline as an outpatient, currently worsened most likely in the setting of dehydration, progressive dementia and infectious process -  Patient had similar presentation according to father few weeks ago secondary to [facility], ever we have confirmed that he was not taking on feet lately, EEG does not show any  acute seizures, CT head unremarkable, with hydration mental status is improving of note he is nonverbal at baseline. Currently on Vimpat for his underlying seizures.  Case discussed with neurologist Dr. [name] on 05/04/2023, no change in treatment plan, patient close to his baseline continue present regimen. -Remains to be significantly encephalopathic, poor oral intake, significant aspiration risk as well. - patient continues to have progressive encephalopathy, he  is high risk for aspiration, and unsafe to swallow at this point.  He is with evidence of pneumonia at this point, empirically on antibiotics.  UTI.    Chest x-ray and KUB stable, borderline, finish 5 days of antibiotics, no sepsis clinically.  Continue to monitor cultures, 1 out of 2 blood cultures likely contamination with Staph epidermidis.   Severe dehydration  Lactic acidosis  AKI  Hypotension  - due to poor oral intake over the last 2 to 3 weeks.  Hydrate, continue IV fluids another day.  Hold ACE inhibitor.   Hypothyroidism -  Home dose Synthroid.  Stable TSH.   Hypertension.   - Hold ACE inhibitor due to AKI as needed IV hydralazine.   Mild rhabdo and asymptomatic transaminitis.   -Improved with IV fluids.  Dyslipidemia.   -On statin.  Hypomagnesemia hypophosphatemia.   Replaced.  Goals of care discussion: -Patient with Down syndrome, appears to be with progressive dementia, seems to be having problem with swallowing, as well with enough oral intake, overall prognosis is very poor, palliative medicine consulted, they have discussed with family, and decision has been made to proceed with full comfort measures, with discharge to residential hospice, but will keep on IV fluids and IV antibiotics during hospitalization.       Condition -fair  Family Communication  : None at bedside on my evaluation today, palliative medicine discussed with with family at bedside meeting today.   Code Status : DNR  Consults  :  Neurology  PUD Prophylaxis : PPI   Procedures  :     Right upper quadrant ultrasound.  Possible mild fatty liver.    CT head.  Nonacute.    EEG This study is consistent with patient's known history of epilepsy with generalized onset.There is also moderate diffuse encephalopathy. No seizures were seen throughout the recording       Disposition Plan  :    Status is: Inpatient   DVT Prophylaxis  :       Lab Results  Component Value Date   PLT 316 05/12/2023    Diet :  Diet Order             Diet regular Fluid consistency: Thin  Diet effective now                    Inpatient Medications  Scheduled Meds:  levothyroxine  66 mcg Intravenous Daily   pantoprazole (PROTONIX) IV  40 mg Intravenous Q0600   Continuous Infusions:  ampicillin-sulbactam (UNASYN) IV 3 g (05/12/23 1319)   dextrose 5 % and 0.45 % NaCl 75 mL/hr at 05/11/23 1447   lacosamide (VIMPAT) IV 100 mg (05/12/23 0942)   And   lacosamide (VIMPAT) IV 200 mg (05/11/23 2142)   PRN Meds:.acetaminophen **OR** acetaminophen, antiseptic oral rinse, bisacodyl, glycopyrrolate, haloperidol lactate, ipratropium-albuterol, LORazepam, metoprolol tartrate, morphine injection, [DISCONTINUED] ondansetron **OR** ondansetron (ZOFRAN) IV, polyvinyl alcohol, senna-docusate, traZODone  Antibiotics  :    Anti-infectives (From admission, onward)    Start     Dose/Rate Route Frequency Ordered Stop   05/09/23 1800  Ampicillin-Sulbactam (UNASYN) 3 g in sodium chloride 0.9 % 100 mL IVPB        3 g 200 mL/hr over 30 Minutes Intravenous Every 6 hours 05/09/23 1705 05/13/23 2359   05/02/23 1600  cefTRIAXone (ROCEPHIN) 1 g in sodium chloride 0.9 % 100 mL IVPB        1 g 200 mL/hr over 30 Minutes Intravenous Every 24 hours  05/02/23 1533 05/06/23 1709   05/02/23 1600  azithromycin (ZITHROMAX) 500 mg in sodium chloride 0.9 % 250 mL IVPB        500 mg 250 mL/hr over 60 Minutes Intravenous Every 24 hours 05/02/23 1533 05/06/23  1930         Objective:   Vitals:   05/12/23 0400 05/12/23 0453 05/12/23 0800 05/12/23 1200  BP: (!) 83/59 (!) 91/51 (!) 104/57 99/62  Pulse: (!) 52 (!) 58 (!) 57 (!) 54  Resp: 12 14 17 13   Temp:   (!) 97.4 F (36.3 C) (!) 97.4 F (36.3 C)  TempSrc:   Axillary Axillary  SpO2: 100% 100% 100% 100%  Weight:      Height:        Wt Readings from Last 3 Encounters:  05/02/23 74.8 kg  09/24/22 74.8 kg  07/01/22 77.1 kg     Intake/Output Summary (Last 24 hours) at 05/12/2023 1418 Last data filed at 05/12/2023 0058 Gross per 24 hour  Intake --  Output 1350 ml  Net -1350 ml     Physical Exam  Patient is somnolent, does not follow any commands or answer any questions Symmetrical Chest wall movement, Good air movement bilaterally, CTAB RRR,No Gallops,Rubs or new Murmurs, No Parasternal Heave +ve B.Sounds, Abd Soft, No tenderness, No rebound - guarding or rigidity. No Cyanosis, Clubbing or edema, No new Rash or bruise          Data Review:    Recent Labs  Lab 05/06/23 0435 05/07/23 0424 05/10/23 0421 05/11/23 0517 05/12/23 0524  WBC 5.9 7.1 5.7 5.0 4.4  HGB 12.2* 11.5* 9.8* 10.4* 10.5*  HCT 35.1* 33.1* 29.1* 30.7* 30.5*  PLT 237 232 302 302 316  MCV 98.9 99.1 100.7* 100.7* 99.7  MCH 34.4* 34.4* 33.9 34.1* 34.3*  MCHC 34.8 34.7 33.7 33.9 34.4  RDW 12.6 12.7 13.2 13.4 13.2  LYMPHSABS 1.8 1.5 1.6 1.3 1.2  MONOABS 0.5 0.5 0.6 0.6 0.5  EOSABS 0.2 0.1 0.1 0.0 0.0  BASOSABS 0.1 0.0 0.0 0.0 0.0    Recent Labs  Lab 05/05/23 1509 05/06/23 0435 05/07/23 0424 05/07/23 0952 05/10/23 0421 05/11/23 0517 05/12/23 0524  NA 135 135 134*  --  134* 132* 135  K 4.3 4.8 4.1  --  3.7 3.8 3.7  CL 103 104 103  --  104 102 106  CO2 23 23 22   --  23 23 24   ANIONGAP 9 8 9   --  7 7 5   GLUCOSE 103* 100* 105*  --  103* 88 119*  BUN 17 15 12   --  13 11 7   CREATININE 0.96 0.98 0.88  --  0.64 0.79 0.75  AST 77* 60* 42*  --   --   --   --   ALT 141* 105* 76*  --   --   --    --   ALKPHOS 76 70 66  --   --   --   --   BILITOT 0.7 0.8 0.6  --   --   --   --   ALBUMIN 1.7* 1.6* 1.6*  --   --   --   --   CRP  --   --   --   --  6.1* 7.6* 9.0*  PROCALCITON  --   --   --   --  <0.10 <0.10 <0.10  MG 1.7 1.6*  --  1.7  --   --   --  CALCIUM 7.8* 7.6* 7.6*  --  7.6* 7.8* 7.7*      Recent Labs  Lab 05/05/23 1509 05/06/23 0435 05/07/23 0424 05/07/23 0952 05/10/23 0421 05/11/23 0517 05/12/23 0524  CRP  --   --   --   --  6.1* 7.6* 9.0*  PROCALCITON  --   --   --   --  <0.10 <0.10 <0.10  MG 1.7 1.6*  --  1.7  --   --   --   CALCIUM 7.8* 7.6* 7.6*  --  7.6* 7.8* 7.7*   Radiology Reports DG Chest Port 1 View Result Date: 05/10/2023 CLINICAL DATA:  Shortness of breath EXAM: PORTABLE CHEST 1 VIEW COMPARISON:  Yesterday FINDINGS: Prominent markings at the lung bases, especially on the right. No edema, effusion, or visible pneumothorax, the chin overlaps the apices. Stable heart size and mediastinal contours. IMPRESSION: Accentuated marking at the lung bases, especially on the right, possible bronchopneumonia. Electronically Signed   By: Tiburcio Pea M.D.   On: 05/10/2023 06:53   DG Chest Port 1 View Result Date: 05/09/2023 CLINICAL DATA:  [id number] SOB (shortness of breath) [id number] EXAM: PORTABLE CHEST 1 VIEW COMPARISON:  05/02/2023 chest radiograph. FINDINGS: Stable cardiomediastinal silhouette with normal heart size. No pneumothorax. Small right pleural effusion. No left pleural effusion. No overt pulmonary edema. Stable linear upper right lung scar. Mild streaky right lung base scarring versus atelectasis. IMPRESSION: Small right pleural effusion. Mild streaky right lung base scarring versus atelectasis. Electronically Signed   By: Delbert Phenix M.D.   On: 05/09/2023 18:32      Signature  -   Huey Bienenstock M.D on 05/12/2023 at 2:18 PM   -  To page go to [email or web address]

## 2023-05-12 NOTE — Progress Notes (Signed)
Palliative Medicine Progress Note   Patient Name: [name]       Date: 05/12/2023 DOB: [birth year]  Age: 57 y.o. MRN#: [id number] Attending Physician: Starleen Arms, MD Primary Care Physician: Lucretia Field Admit Date: 05/02/2023   HPI/Patient Profile: 57 y.o. male with past medical history of Down syndrome, apparently nonverbal at baseline, recurrent falls, seizures, hypothyroidism, hypertension who lives in a group home who presented to the ED on 05/02/2023 with altered mental status.  Patient is brought in by his father, apparently patient at baseline is mildly drowsy and usually is alert 10 to 12 hours a day however for the last 2 to 3 weeks he has been increasingly more drowsy not eating or drinking well, finally they brought him to the ER today where he was diagnosed with mild leukocytosis, severe dehydration and AKI.  Possible early sepsis could not be ruled out although he is not febrile, chest x-ray and abdominal x-ray are stable. Patient himself is extremely somnolent and unable to provide any history.    Hospital course complicated by progressive encephalopathy, he remains high risk for aspiration.   Patient has lived in a group home for the past 25 years.   He has 5 siblings.    Family face treatment option decisions, advanced directive decisions and anticipatory care needs.  Subjective: Chart reviewed. No significant events overnight.   I met at bedside with patient's father/[name] and mother/[name]. Patient is somnolent during my visit. [name] and dad have discussed patient's current medical situation with 4 of his 5 siblings.  The family seems to be in agreement with transition to comfort care and referral to inpatient hospice (they specifically requested [facility]).  Reviewed transition to comfort care, and what that would look like--keeping him clean and dry, no labs, no artificial hydration or feeding, no antibiotics, minimizing of medications, comfort feeds, and medication for as needed for pain or other symptoms.  Family requests to continue antibiotics and seizure medications until discharge.  Discussed the concept of comfort feeds. Reviewed that patient has ongoing high aspiration risk. However, if he shows interest in food/drink, then the benefit of comfort would outweigh the risk of aspiration at EOL.   Questions and concerns addressed. Emotional support provided.   Objective:  Physical Exam Vitals reviewed.  Constitutional:  General: He is not in acute distress.    Appearance: He is ill-appearing.  Pulmonary:     Effort: No respiratory distress.  Neurological:     Mental Status: He is lethargic.  Psychiatric:        Speech: He is noncommunicative.            Palliative Medicine Assessment & Plan   Assessment: Principal Problem:   AKI (acute kidney injury) (HCC) Active Problems:   Obstructive sleep apnea   Down syndrome   Seizure disorder (HCC)    Recommendations/Plan: Transition to comfort care D/C cardiac monitoring, minimize medications Comfort feeds Referral to inpatient hospice [facility]) Continue antibiotics and seizure medications until discharge PMT will continue to follow and support  Symptom Management:  Morphine prn for pain or dyspnea Lorazepam (ATIVAN) prn for anxiety Haloperidol (HALDOL) prn for agitation  Glycopyrrolate (ROBINUL) for excessive secretions Ondansetron (ZOFRAN) prn for nausea Polyvinyl alcohol (LIQUIFILM TEARS) prn for dry eyes Antiseptic oral rinse (BIOTENE) prn for dry mouth  Code Status: DNR - comfort   Prognosis:  < 2 weeks  Discharge Planning: To Be Determined   Thank you for allowing the Palliative Medicine Team to assist in the care of this  patient.   Time: 40 minutes  Detailed review of medical records (labs, imaging, vital signs), medically appropriate exam, discussed with treatment team, counseling and education to patient, family, & staff, documenting clinical information, medication management, coordination of care.    Manuel Proud, NP   Please contact Palliative Medicine Team phone at [phone] for questions and concerns.  For individual providers, please see AMION.

## 2023-05-12 NOTE — Progress Notes (Signed)
[facility] Liaison Note  Received request from [place], Transitions of Care Manager, for hospice services at inpatient hospice facility after discharge. Visited with patient and attempted to contact both his mother and father with voice mails left.   Will continue to follow [name] for inpatient hospice care consideration and will follow up with contacting his mother or father.   Above information shared with Osborne Casco, Transitions of Care Manager. Please call with any questions or concerns.   Thank you for the opportunity to participate in this patient's care.   Glenna Fellows BSN, Charity fundraiser, [facility] [employer] [phone]

## 2023-05-13 DIAGNOSIS — N179 Acute kidney failure, unspecified: Secondary | ICD-10-CM | POA: Diagnosis not present

## 2023-05-13 MED ORDER — ACETAMINOPHEN 325 MG PO TABS: 650.0000 mg | ORAL_TABLET | Freq: Four times a day (QID) | ORAL | Status: AC | PRN

## 2023-05-13 MED ORDER — LORAZEPAM 1 MG PO TABS: 1.0000 mg | ORAL_TABLET | ORAL | Status: AC | PRN

## 2023-05-13 MED ORDER — MORPHINE SULFATE (CONCENTRATE) 10 MG /0.5 ML PO SOLN: 5.0000 mg | ORAL | Status: AC | PRN

## 2023-05-13 NOTE — Plan of Care (Signed)
  Problem: Education: Goal: Knowledge of General Education information will improve Description: Including pain rating scale, medication(s)/side effects and non-pharmacologic comfort measures Outcome: Progressing   Problem: Health Behavior/Discharge Planning: Goal: Ability to manage health-related needs will improve Outcome: Progressing   Problem: Clinical Measurements: Goal: Ability to maintain clinical measurements within normal limits will improve Outcome: Progressing Goal: Will remain free from infection Outcome: Progressing Goal: Diagnostic test results will improve Outcome: Progressing Goal: Respiratory complications will improve Outcome: Progressing Goal: Cardiovascular complication will be avoided Outcome: Progressing   Problem: Activity: Goal: Risk for activity intolerance will decrease Outcome: Progressing   Problem: Nutrition: Goal: Adequate nutrition will be maintained Outcome: Progressing   Problem: Coping: Goal: Level of anxiety will decrease Outcome: Progressing   Problem: Elimination: Goal: Will not experience complications related to bowel motility Outcome: Progressing Goal: Will not experience complications related to urinary retention Outcome: Progressing   Problem: Pain Management: Goal: General experience of comfort will improve Outcome: Progressing   Problem: Safety: Goal: Ability to remain free from injury will improve Outcome: Progressing   Problem: Skin Integrity: Goal: Risk for impaired skin integrity will decrease Outcome: Progressing   Problem: Nutrition Goal: Patient maintains adequate hydration Outcome: Progressing Goal: Patient maintains weight Outcome: Progressing Goal: Patient/Family demonstrates understanding of diet Outcome: Progressing Goal: Patient/Family independently completes tube feeding Outcome: Progressing Goal: Patient will have no more than 5 lb weight change during LOS Outcome: Progressing Goal: Patient will  utilize adaptive techniques to administer nutrition Outcome: Progressing Goal: Patient will verbalize dietary restrictions Outcome: Progressing   Problem: Education: Goal: Knowledge of the prescribed therapeutic regimen will improve Outcome: Progressing   Problem: Coping: Goal: Ability to identify and develop effective coping behavior will improve Outcome: Progressing   Problem: Clinical Measurements: Goal: Quality of life will improve Outcome: Progressing   Problem: Respiratory: Goal: Verbalizations of increased ease of respirations will increase Outcome: Progressing   Problem: Role Relationship: Goal: Family's ability to cope with current situation will improve Outcome: Progressing Goal: Ability to verbalize concerns, feelings, and thoughts to partner or family member will improve Outcome: Progressing   Problem: Pain Management: Goal: Satisfaction with pain management regimen will improve Outcome: Progressing

## 2023-05-13 NOTE — Discharge Summary (Signed)
Physician Discharge Summary  [name] WUJ:[id number] DOB: [birth year] DOA: 05/02/2023  PCP: Lucretia Field  Admit date: 05/02/2023 Discharge date: 05/13/2023   Disposition:  [facility])  Recommendations for Outpatient Follow-up:  Management per residential hospice  Diet recommendation: Diet, feeding for comfort  Brief/Interim Summary:  57 y.o. male,  Down syndrome, apparently nonverbal at baseline, recurrent falls, seizures, hypothyroidism, hypertension who lives in a group home comes into the hospital with altered mental status.  Patient is brought in by his father, apparently patient at baseline is mildly drowsy and usually is alert 10 to 12 hours a day however for the last 2 to 3 weeks he has been increasingly more drowsy not eating or drinking well, he was brought to ED, was found to have leukocytosis, lactic acidosis, dehydration and AKI, there was a questionable sepsis, chest x-ray and abdominal x-ray are stable. Patient himself is extremely somnolent and unable to provide any history.  Patient with significant encephalopathy, appears to be with progressive encephalopathy, possible dementia, and deconditioning, with very poor appetite, oral intake despite IV fluids, and appropriate treatment, he continues to decline, as well he did have aspiration pneumonia, without much improvement with antibiotics, palliative medicine has been consulted, and patient has been transitioned to full comfort measures, and [facility], and bed is available today.    Acute metabolic encephalopathy with underlying significant dementia in a patient with Down syndrome's  Progressive dementia Acute encephalopathy from dehydration, AKI and hypotension -Patient continues to have progressive decline as an outpatient, currently worsened most likely in the setting of dehydration, progressive dementia and infectious process -  Patient had similar presentation  according to father few weeks ago secondary to [facility], ever we have confirmed that he was not taking on feet lately, EEG does not show any acute seizures, CT head unremarkable, with hydration mental status is improving of note he is nonverbal at baseline. Currently on Vimpat for his underlying seizures.  Case discussed with neurologist Dr. [name] on 05/04/2023, no change in treatment plan, patient close to his baseline continue present regimen. -Remains to be significantly encephalopathic, poor oral intake, significant aspiration risk as well. - patient continues to have progressive encephalopathy, he is high risk for aspiration, and unsafe to swallow at this point.  He is with evidence of pneumonia at this point, treated with IV antibiotics, remains encephalopathic, did not improve with any treatment, at this point he remains high risk for aspiration, given his encephalopathy and deconditioning, he is comfort measures only, [facility] for [facility] discharge today   UTI.    Chest x-ray and KUB stable, borderline, treated with antibiotics  1 out of 2 blood cultures likely contamination with Staph epidermidis.   Severe dehydration  Lactic acidosis  AKI  Hypotension  - due to poor oral intake over the last 2 to 3 weeks.  Was treated with IV fluids, but despite that remains encephalopathic with poor oral intake, minimally responsive.    Hypothyroidism    Mild rhabdo and asymptomatic transaminitis.     Dyslipidemia.      Hypomagnesemia hypophosphatemia.    Goals of care discussion: -Patient with Down syndrome, appears to be with progressive dementia, seems to be having problem with swallowing, as well with enough oral intake, overall prognosis is very poor, palliative medicine consulted, they have discussed with family, and decision has been made to proceed with full comfort measures, with discharge to residential hospice    Discharge Diagnoses:  Principal Problem:   AKI (acute kidney injury)  (HCC) Active Problems:   Obstructive sleep apnea   Down syndrome   Seizure disorder [name])    Discharge Instructions  Discharge Instructions     Diet - low sodium heart healthy   Complete by: As directed    Discharge instructions   Complete by: As directed    Management per residential hospice   Increase activity slowly   Complete by: As directed    No wound care   Complete by: As directed       Allergies as of 05/13/2023       Reactions   Clonidine Derivatives Other (See Comments)   Causes symptomatic bradycardia with syncope and hypotension        Medication List     STOP taking these medications    acetaminophen 160 MG/5ML solution Commonly known as: TYLENOL Replaced by: acetaminophen 325 MG tablet   ANTACID LIQUID PO   atorvastatin 10 MG tablet Commonly known as: LIPITOR   benazepril 10 MG tablet Commonly known as: LOTENSIN   calcitonin (salmon) 200 UNIT/ACT nasal spray Commonly known as: MIACALCIN/FORTICAL   CeraVe Daily Moisturizing Lotn   cloBAZam 10 MG tablet Commonly known as: ONFI   diphenhydrAMINE 12.5 MG/5ML elixir Commonly known as: BENADRYL   doxycycline 50 MG capsule Commonly known as: VIBRAMYCIN   Fish Oil 500 MG Caps   guaiFENesin 100 MG/5ML liquid Commonly known as: ROBITUSSIN   Johnsons Baby Shampoo Sham   Lacosamide 100 MG Tabs   LACRI-LUBE OP   lactulose 10 GM/15ML solution Commonly known as: CHRONULAC   levothyroxine 88 MCG tablet Commonly known as: SYNTHROID   loperamide 2 MG tablet Commonly known as: IMODIUM A-D   magnesium hydroxide 400 MG/5ML suspension Commonly known as: MILK OF MAGNESIA   mupirocin ointment 2 % Commonly known as: BACTROBAN   PHISODERM EX   promethazine 25 MG tablet Commonly known as: PHENERGAN   vitamin A & D ointment   Vitamin D3 50 MCG (2000 UT) Tabs       TAKE these medications    acetaminophen 325 MG tablet Commonly known as: TYLENOL Take 2 tablets (650 mg total)  by mouth every 6 (six) hours as needed for mild pain (pain score 1-3) (or Fever >/= 101). Replaces: acetaminophen 160 MG/5ML solution   LORazepam 1 MG tablet Commonly known as: Ativan Take 1 tablet (1 mg total) by mouth every 4 (four) hours as needed for anxiety, seizure or sedation.   morphine CONCENTRATE 10 mg / 0.5 ml concentrated solution Take 0.25 mLs (5 mg total) by mouth every 3 (three) hours as needed for moderate pain (pain score 4-6), severe pain (pain score 7-10), anxiety or shortness of breath.        Contact information for after-discharge care     Destination     [facility] .   Service: Skilled Nursing Contact information: [address] [place] [address] [phone]                    Allergies  Allergen Reactions   Clonidine Derivatives Other (See Comments)    Causes symptomatic bradycardia with syncope and hypotension    Consultations: Palliative medicine Neurology   Procedures/Studies: DG Chest Port 1 View Result Date: 05/10/2023 CLINICAL DATA:  Shortness of breath EXAM: PORTABLE CHEST 1 VIEW COMPARISON:  Yesterday FINDINGS: Prominent markings at the lung bases, especially on the right. No edema, effusion, or visible pneumothorax, the  chin overlaps the apices. Stable heart size and mediastinal contours. IMPRESSION: Accentuated marking at the lung bases, especially on the right, possible bronchopneumonia. Electronically Signed   By: Tiburcio Pea M.D.   On: 05/10/2023 06:53   DG Chest Port 1 View Result Date: 05/09/2023 CLINICAL DATA:  [id number] SOB (shortness of breath) [id number] EXAM: PORTABLE CHEST 1 VIEW COMPARISON:  05/02/2023 chest radiograph. FINDINGS: Stable cardiomediastinal silhouette with normal heart size. No pneumothorax. Small right pleural effusion. No left pleural effusion. No overt pulmonary edema. Stable linear upper right lung scar. Mild streaky right lung base scarring versus atelectasis.  IMPRESSION: Small right pleural effusion. Mild streaky right lung base scarring versus atelectasis. Electronically Signed   By: Delbert Phenix M.D.   On: 05/09/2023 18:32   EEG adult Result Date: 05/03/2023 Charlsie Quest, MD     05/03/2023  8:31 AM Patient Name: [name] MRN: [id number] Epilepsy Attending: Charlsie Quest Referring Physician/Provider: Leroy Sea, MD Date: 05/02/2023 Duration: 26.22 mins Patient history: 58yo M with ams getting eeg to evaluate for seizure Level of alertness: lethargic, sleep AEDs during EEG study: LCM Technical aspects: This EEG study was done with scalp electrodes positioned according to the 10-20 International system of electrode placement. Electrical activity was reviewed with band pass filter of 1-70Hz , sensitivity of 7 uV/mm, display speed of 38mm/sec with a 60Hz  notched filter applied as appropriate. EEG data were recorded continuously and digitally stored.  Video monitoring was available and reviewed as appropriate. Description: No posterior dominant rhythm was seen. Sleep was characterized by sleep spindles (12 to 14 Hz), maximal frontocentral region.  EEG showed continuous generalized 3 to 6 Hz theta-delta slowing. Abundant generalized spike and wave complex were noted at 1-2hz . Hyperventilation and photic stimulation were not performed.   ABNORMALITY - Spike and wave,generalized - Continuous slow, generalized  IMPRESSION: This study is consistent with patient's known history of epilepsy with generalized onset.There is also moderate diffuse encephalopathy. No seizures were seen throughout the recording.  Priyanka Annabelle Harman   US Abdomen Limited RUQ (LIVER/GB) Result Date: 05/02/2023 CLINICAL DATA:  Elevated liver enzymes. EXAM: ULTRASOUND ABDOMEN LIMITED RIGHT UPPER QUADRANT COMPARISON:  None Available. FINDINGS: Evaluation is limited due to overlying bowel gas. Gallbladder: No gallstones or wall thickening visualized. No sonographic Murphy sign noted  by sonographer. Common bile duct: Diameter: 1 mm Liver: Mild increased liver echogenicity. Portal vein is patent on color Doppler imaging with normal direction of blood flow towards the liver. Other: None. IMPRESSION: Probable mild fatty liver, otherwise unremarkable right upper quadrant ultrasound. Electronically Signed   By: Elgie Collard M.D.   On: 05/02/2023 22:18   CT Head Wo Contrast Result Date: 05/02/2023 CLINICAL DATA:  Altered mental status EXAM: CT HEAD WITHOUT CONTRAST TECHNIQUE: Contiguous axial images were obtained from the base of the skull through the vertex without intravenous contrast. RADIATION DOSE REDUCTION: This exam was performed according to the departmental dose-optimization program which includes automated exposure control, adjustment of the mA and/or kV according to patient size and/or use of iterative reconstruction technique. COMPARISON:  03/03/2023 FINDINGS: Examination is limited by artifacts and motion. Brain: No evidence of acute infarction, hemorrhage, mass, mass effect, or midline shift. No hydrocephalus or extra-axial fluid collection. Redemonstrated cerebral volume loss with ex vacuo dilatation ventricles, which appears similar to the prior exam. Vascular: No hyperdense vessel. Skull: Negative for fracture or focal lesion. Sinuses/Orbits: No acute finding. Other: The mastoid air cells are well aerated. Redemonstrated calcification in right nasopharynx. IMPRESSION:  No acute intracranial process. Electronically Signed   By: Wiliam Ke M.D.   On: 05/02/2023 17:24   DG Abd Portable 1V Result Date: 05/02/2023 CLINICAL DATA:  [id number] Constipated [id number] EXAM: PORTABLE ABDOMEN - 1 VIEW COMPARISON:  None Available. FINDINGS: The bowel gas pattern is non-obstructive. Physiological stool burden. No evidence of pneumoperitoneum, within the limitations of a supine film. Subacute/healing fractures of the anterolateral right eighth through tenth ribs noted. The soft tissues are  within normal limits. Surgical changes, devices, tubes and lines: None. IMPRESSION: *Nonobstructive bowel gas pattern.  No constipation. *Subacute/healing fractures of the anterolateral right eighth through tenth ribs. Electronically Signed   By: Jules Schick M.D.   On: 05/02/2023 16:14   DG Chest Port 1 View Result Date: 05/02/2023 CLINICAL DATA:  Weakness. EXAM: PORTABLE CHEST 1 VIEW COMPARISON:  03/03/2023. FINDINGS: Low lung volume. There are nonspecific increased interstitial markings throughout bilateral lungs. However, no frank pulmonary edema. No acute consolidation or lung collapse. Bilateral costophrenic angles are clear. No pneumothorax. Stable cardio-mediastinal silhouette. No acute osseous abnormalities. Distal right humerus metallic hardware noted. The soft tissues are within normal limits. IMPRESSION: *Increased interstitial markings throughout bilateral lungs, nonspecific. No acute consolidation or lung collapse. Electronically Signed   By: Jules Schick M.D.   On: 05/02/2023 16:10     Subjective:  No significant events overnight as discussed with staff, patient remains somnolent, and communicative, cannot provide any complaints, he is with almost no oral intake Discharge Exam: Vitals:   05/13/23 0444 05/13/23 1158  BP: 119/88 (!) 107/56  Pulse: 62 65  Resp: 18 18  Temp: 97.7 F (36.5 C) (!) 97.3 F (36.3 C)  SpO2:     Vitals:   05/12/23 1600 05/12/23 2056 05/13/23 0444 05/13/23 1158  BP: 112/69 114/65 119/88 (!) 107/56  Pulse: (!) 49 (!) 55 62 65  Resp: 14 16 18 18   Temp:  98.6 F (37 C) 97.7 F (36.5 C) (!) 97.3 F (36.3 C)  TempSrc:  Axillary Axillary Axillary  SpO2: 100%     Weight:      Height:        General: Somnolent, frail, chronically ill-appearing Cardiovascular: RRR Respiratory: Diminished air entry at the bases Abdominal: Soft, NT, ND, bowel sounds + Extremities: no edema, no cyanosis    The results of significant diagnostics from this  hospitalization (including imaging, microbiology, ancillary and laboratory) are listed below for reference.     Microbiology: Recent Results (from the past 240 hours)  Culture, blood (Routine X 2) w Reflex to ID Panel     Status: None (Preliminary result)   Collection Time: 05/09/23  5:39 PM   Specimen: BLOOD RIGHT ARM  Result Value Ref Range Status   Specimen Description BLOOD RIGHT ARM  Final   Special Requests   Final    BOTTLES DRAWN AEROBIC AND ANAEROBIC Blood Culture results may not be optimal due to an inadequate volume of blood received in culture bottles   Culture   Final    NO GROWTH 4 DAYS Performed at [facility], [address] [place], [place] [address]    Report Status PENDING  Incomplete  Culture, blood (Routine X 2) w Reflex to ID Panel     Status: None (Preliminary result)   Collection Time: 05/09/23  5:43 PM   Specimen: BLOOD LEFT ARM  Result Value Ref Range Status   Specimen Description BLOOD LEFT ARM  Final   Special Requests   Final  BOTTLES DRAWN AEROBIC AND ANAEROBIC Blood Culture adequate volume   Culture   Final    NO GROWTH 4 DAYS Performed at [facility], [address] [place], [place] [address]    Report Status PENDING  Incomplete     Labs: BNP (last 3 results) No results for input(s): "BNP" in the last 8760 hours. Basic Metabolic Panel: Recent Labs  Lab 05/07/23 0424 05/07/23 0952 05/10/23 0421 05/11/23 0517 05/12/23 0524  NA 134*  --  134* 132* 135  K 4.1  --  3.7 3.8 3.7  CL 103  --  104 102 106  CO2 22  --  23 23 24   GLUCOSE 105*  --  103* 88 119*  BUN 12  --  13 11 7   CREATININE 0.88  --  0.64 0.79 0.75  CALCIUM 7.6*  --  7.6* 7.8* 7.7*  MG  --  1.7  --   --   --    Liver Function Tests: Recent Labs  Lab 05/07/23 0424  AST 42*  ALT 76*  ALKPHOS 66  BILITOT 0.6  PROT 4.7*  ALBUMIN 1.6*   No results for input(s): "LIPASE", "AMYLASE" in the last 168 hours. No results for input(s): "AMMONIA" in the last  168 hours. CBC: Recent Labs  Lab 05/07/23 0424 05/10/23 0421 05/11/23 0517 05/12/23 0524  WBC 7.1 5.7 5.0 4.4  NEUTROABS 4.9 3.3 3.0 2.6  HGB 11.5* 9.8* 10.4* 10.5*  HCT 33.1* 29.1* 30.7* 30.5*  MCV 99.1 100.7* 100.7* 99.7  PLT 232 302 302 316   Cardiac Enzymes: Recent Labs  Lab 05/07/23 0424  CKTOTAL 633*   BNP: Invalid input(s): "POCBNP" CBG: No results for input(s): "GLUCAP" in the last 168 hours. D-Dimer No results for input(s): "DDIMER" in the last 72 hours. Hgb A1c No results for input(s): "HGBA1C" in the last 72 hours. Lipid Profile No results for input(s): "CHOL", "HDL", "LDLCALC", "TRIG", "CHOLHDL", "LDLDIRECT" in the last 72 hours. Thyroid function studies No results for input(s): "TSH", "T4TOTAL", "T3FREE", "THYROIDAB" in the last 72 hours.  Invalid input(s): "FREET3" Anemia work up No results for input(s): "VITAMINB12", "FOLATE", "FERRITIN", "TIBC", "IRON", "RETICCTPCT" in the last 72 hours. Urinalysis    Component Value Date/Time   COLORURINE YELLOW 05/09/2023 2016   APPEARANCEUR CLEAR 05/09/2023 2016   LABSPEC 1.013 05/09/2023 2016   PHURINE 6.0 05/09/2023 2016   GLUCOSEU NEGATIVE 05/09/2023 2016   HGBUR NEGATIVE 05/09/2023 2016   BILIRUBINUR NEGATIVE 05/09/2023 2016   KETONESUR NEGATIVE 05/09/2023 2016   PROTEINUR NEGATIVE 05/09/2023 2016   NITRITE NEGATIVE 05/09/2023 2016   LEUKOCYTESUR NEGATIVE 05/09/2023 2016   Sepsis Labs Recent Labs  Lab 05/07/23 0424 05/10/23 0421 05/11/23 0517 05/12/23 0524  WBC 7.1 5.7 5.0 4.4   Microbiology Recent Results (from the past 240 hours)  Culture, blood (Routine X 2) w Reflex to ID Panel     Status: None (Preliminary result)   Collection Time: 05/09/23  5:39 PM   Specimen: BLOOD RIGHT ARM  Result Value Ref Range Status   Specimen Description BLOOD RIGHT ARM  Final   Special Requests   Final    BOTTLES DRAWN AEROBIC AND ANAEROBIC Blood Culture results may not be optimal due to an inadequate volume  of blood received in culture bottles   Culture   Final    NO GROWTH 4 DAYS Performed at [facility], [address], [place], [place] [address]    Report Status PENDING  Incomplete  Culture, blood (  Routine X 2) w Reflex to ID Panel     Status: None (Preliminary result)   Collection Time: 05/09/23  5:43 PM   Specimen: BLOOD LEFT ARM  Result Value Ref Range Status   Specimen Description BLOOD LEFT ARM  Final   Special Requests   Final    BOTTLES DRAWN AEROBIC AND ANAEROBIC Blood Culture adequate volume   Culture   Final    NO GROWTH 4 DAYS Performed at [facility], [address], [place], [place] [address]    Report Status PENDING  Incomplete     Time coordinating discharge: Over 30 minutes  SIGNED:   Huey Bienenstock, MD  [facility] 05/13/2023, 4:07 PM Pager   If 7PM-7AM, please contact night-coverage [email or web address] Password [email or web address]

## 2023-05-13 NOTE — TOC Transition Note (Signed)
Transition of Care [facility]) - Discharge Note   Patient Details  Name: [name] MRN: [id number] Date of Birth: 06-15-1965  Transition of [facility]) CM/SW Contact:  Mearl Latin, LCSW Phone Number: 05/13/2023, 5:12 PM   Clinical Narrative:    Patient will DC to: [facility] Anticipated DC date: 05/13/23 Family notified: Father Transport by: [facility] to call for PT[facility] once consents are signed.    Per MD patient ready for DC to [facility]. RN to call report prior to discharge [phone]). RN, patient, patient's family, and facility notified of DC. Discharge Summary sent to facility. DC packet on chart including signed DNR.   CSW will sign off for now as social work intervention is no longer needed. Please consult [place] again if new needs arise.     Final next level of care: Hospice Medical Facility Barriers to Discharge: Barriers Resolved   Patient Goals and CMS Choice Patient states their goals for this hospitalization and ongoing recovery are:: Comfort CMS Medicare.gov Compare Post Acute Care list provided to:: Patient Represenative (must comment) Choice offered to / list presented to : Parent Tea ownership interest in [facility].provided to:: Parent NA    Discharge Placement              Patient chooses bed at:  [facility]) Patient to be transferred to facility by: PTAR Name of family member notified: Father Patient and family notified of of transfer: 05/13/23  Discharge Plan and Services Additional resources added to the After Visit Summary for   In-house Referral: Clinical Social Work   Post Acute Care Choice: Skilled Nursing Facility                               Social Drivers of Health (SDOH) Interventions SDOH Screenings   Food Insecurity: Patient Unable To Answer (05/02/2023)  Housing: Patient Unable To Answer (05/13/2023)  Recent Concern: Housing - High Risk (05/02/2023)  Transportation Needs:  Patient Unable To Answer (05/02/2023)  Utilities: Patient Unable To Answer (05/02/2023)  Social Connections: Unknown (05/28/2022)   Received from [facility], [facility]  Tobacco Use: Low Risk  (05/02/2023)     Readmission Risk Interventions    02/18/2021    2:09 PM  Readmission Risk Prevention Plan  Transportation Screening Complete  PCP or Specialist Appt within 5-7 Days Complete  Home Care Screening Complete  Medication Review (RN CM) Complete

## 2023-05-13 NOTE — Discharge Instructions (Signed)
Management per residential hospice.

## 2023-05-14 LAB — CULTURE, BLOOD (ROUTINE X 2)
Culture: NO GROWTH
Culture: NO GROWTH
Special Requests: ADEQUATE

## 2023-05-26 ENCOUNTER — Institutional Professional Consult (permissible substitution): Payer: Medicare Other | Admitting: Internal Medicine

## 2023-07-05 ENCOUNTER — Institutional Professional Consult (permissible substitution): Payer: Medicare Other | Admitting: Internal Medicine

## 2023-08-29 ENCOUNTER — Ambulatory Visit: Payer: Medicare Other | Admitting: Adult Health
